# Patient Record
Sex: Male | Born: 1975 | Race: White | Hispanic: No | Marital: Single
Health system: Southern US, Community
[De-identification: ages and names within clinical notes are randomized; demographics above are authoritative.]

## PROBLEM LIST (undated history)

## (undated) DIAGNOSIS — F329 Major depressive disorder, single episode, unspecified: Secondary | ICD-10-CM

## (undated) DIAGNOSIS — F1410 Cocaine abuse, uncomplicated: Secondary | ICD-10-CM

## (undated) DIAGNOSIS — K5090 Crohn's disease, unspecified, without complications: Secondary | ICD-10-CM

## (undated) DIAGNOSIS — K219 Gastro-esophageal reflux disease without esophagitis: Secondary | ICD-10-CM

## (undated) DIAGNOSIS — J45909 Unspecified asthma, uncomplicated: Secondary | ICD-10-CM

## (undated) DIAGNOSIS — F1010 Alcohol abuse, uncomplicated: Secondary | ICD-10-CM

## (undated) DIAGNOSIS — F32A Depression, unspecified: Secondary | ICD-10-CM

---

## 1998-03-27 ENCOUNTER — Emergency Department (HOSPITAL_COMMUNITY): Admission: EM | Admit: 1998-03-27 | Discharge: 1998-03-27 | Payer: Self-pay | Admitting: Emergency Medicine

## 1998-03-27 ENCOUNTER — Encounter: Payer: Self-pay | Admitting: Emergency Medicine

## 2004-02-25 ENCOUNTER — Emergency Department (HOSPITAL_COMMUNITY): Admission: AD | Admit: 2004-02-25 | Discharge: 2004-02-25 | Payer: Self-pay | Admitting: Family Medicine

## 2005-03-30 ENCOUNTER — Ambulatory Visit: Payer: Self-pay | Admitting: Internal Medicine

## 2005-04-10 ENCOUNTER — Ambulatory Visit: Payer: Self-pay | Admitting: *Deleted

## 2005-05-01 ENCOUNTER — Ambulatory Visit: Payer: Self-pay | Admitting: Internal Medicine

## 2005-05-03 ENCOUNTER — Ambulatory Visit: Payer: Self-pay | Admitting: Internal Medicine

## 2006-05-03 ENCOUNTER — Ambulatory Visit: Payer: Self-pay | Admitting: Internal Medicine

## 2006-05-14 DIAGNOSIS — K5090 Crohn's disease, unspecified, without complications: Secondary | ICD-10-CM

## 2006-05-14 HISTORY — DX: Crohn's disease, unspecified, without complications: K50.90

## 2006-06-11 ENCOUNTER — Ambulatory Visit: Payer: Self-pay | Admitting: Internal Medicine

## 2006-08-09 ENCOUNTER — Ambulatory Visit: Payer: Self-pay | Admitting: Internal Medicine

## 2006-12-13 ENCOUNTER — Ambulatory Visit: Payer: Self-pay | Admitting: Internal Medicine

## 2007-01-29 ENCOUNTER — Encounter (INDEPENDENT_AMBULATORY_CARE_PROVIDER_SITE_OTHER): Payer: Self-pay | Admitting: *Deleted

## 2007-02-18 ENCOUNTER — Emergency Department (HOSPITAL_COMMUNITY): Admission: EM | Admit: 2007-02-18 | Discharge: 2007-02-18 | Payer: Self-pay | Admitting: Emergency Medicine

## 2007-02-19 ENCOUNTER — Emergency Department (HOSPITAL_COMMUNITY): Admission: EM | Admit: 2007-02-19 | Discharge: 2007-02-19 | Payer: Self-pay | Admitting: Emergency Medicine

## 2007-02-21 ENCOUNTER — Telehealth (INDEPENDENT_AMBULATORY_CARE_PROVIDER_SITE_OTHER): Payer: Self-pay | Admitting: *Deleted

## 2007-02-23 ENCOUNTER — Emergency Department (HOSPITAL_COMMUNITY): Admission: EM | Admit: 2007-02-23 | Discharge: 2007-02-24 | Payer: Self-pay | Admitting: Emergency Medicine

## 2007-04-01 ENCOUNTER — Ambulatory Visit: Payer: Self-pay | Admitting: Psychiatry

## 2007-04-01 ENCOUNTER — Inpatient Hospital Stay (HOSPITAL_COMMUNITY): Admission: RE | Admit: 2007-04-01 | Discharge: 2007-04-04 | Payer: Self-pay | Admitting: Psychiatry

## 2007-04-01 ENCOUNTER — Emergency Department (HOSPITAL_COMMUNITY): Admission: EM | Admit: 2007-04-01 | Discharge: 2007-04-01 | Payer: Self-pay | Admitting: Emergency Medicine

## 2007-05-22 ENCOUNTER — Encounter (INDEPENDENT_AMBULATORY_CARE_PROVIDER_SITE_OTHER): Payer: Self-pay | Admitting: Internal Medicine

## 2007-06-27 ENCOUNTER — Telehealth (INDEPENDENT_AMBULATORY_CARE_PROVIDER_SITE_OTHER): Payer: Self-pay | Admitting: Nurse Practitioner

## 2007-07-18 ENCOUNTER — Ambulatory Visit: Payer: Self-pay | Admitting: Internal Medicine

## 2007-07-18 DIAGNOSIS — K5090 Crohn's disease, unspecified, without complications: Secondary | ICD-10-CM | POA: Insufficient documentation

## 2007-07-18 DIAGNOSIS — F329 Major depressive disorder, single episode, unspecified: Secondary | ICD-10-CM | POA: Insufficient documentation

## 2007-07-18 DIAGNOSIS — F1910 Other psychoactive substance abuse, uncomplicated: Secondary | ICD-10-CM | POA: Insufficient documentation

## 2007-07-18 DIAGNOSIS — F17200 Nicotine dependence, unspecified, uncomplicated: Secondary | ICD-10-CM | POA: Insufficient documentation

## 2007-07-18 DIAGNOSIS — J309 Allergic rhinitis, unspecified: Secondary | ICD-10-CM | POA: Insufficient documentation

## 2007-07-18 DIAGNOSIS — J45909 Unspecified asthma, uncomplicated: Secondary | ICD-10-CM | POA: Insufficient documentation

## 2007-07-18 DIAGNOSIS — K219 Gastro-esophageal reflux disease without esophagitis: Secondary | ICD-10-CM | POA: Insufficient documentation

## 2007-07-23 ENCOUNTER — Encounter (INDEPENDENT_AMBULATORY_CARE_PROVIDER_SITE_OTHER): Payer: Self-pay | Admitting: Internal Medicine

## 2007-07-24 ENCOUNTER — Encounter (INDEPENDENT_AMBULATORY_CARE_PROVIDER_SITE_OTHER): Payer: Self-pay | Admitting: Internal Medicine

## 2007-08-08 ENCOUNTER — Emergency Department (HOSPITAL_COMMUNITY): Admission: EM | Admit: 2007-08-08 | Discharge: 2007-08-08 | Payer: Self-pay | Admitting: Family Medicine

## 2007-12-05 ENCOUNTER — Emergency Department (HOSPITAL_COMMUNITY): Admission: EM | Admit: 2007-12-05 | Discharge: 2007-12-05 | Payer: Self-pay | Admitting: Emergency Medicine

## 2007-12-05 ENCOUNTER — Ambulatory Visit: Payer: Self-pay | Admitting: Psychiatry

## 2007-12-05 ENCOUNTER — Inpatient Hospital Stay (HOSPITAL_COMMUNITY): Admission: AD | Admit: 2007-12-05 | Discharge: 2007-12-09 | Payer: Self-pay | Admitting: Psychiatry

## 2008-02-24 ENCOUNTER — Encounter (INDEPENDENT_AMBULATORY_CARE_PROVIDER_SITE_OTHER): Payer: Self-pay | Admitting: Internal Medicine

## 2008-02-26 ENCOUNTER — Telehealth (INDEPENDENT_AMBULATORY_CARE_PROVIDER_SITE_OTHER): Payer: Self-pay | Admitting: Internal Medicine

## 2008-02-26 ENCOUNTER — Encounter (INDEPENDENT_AMBULATORY_CARE_PROVIDER_SITE_OTHER): Payer: Self-pay | Admitting: Internal Medicine

## 2008-03-03 ENCOUNTER — Encounter (INDEPENDENT_AMBULATORY_CARE_PROVIDER_SITE_OTHER): Payer: Self-pay | Admitting: *Deleted

## 2008-03-18 ENCOUNTER — Encounter (INDEPENDENT_AMBULATORY_CARE_PROVIDER_SITE_OTHER): Payer: Self-pay | Admitting: Internal Medicine

## 2008-04-16 ENCOUNTER — Ambulatory Visit: Payer: Self-pay | Admitting: Internal Medicine

## 2008-09-11 ENCOUNTER — Emergency Department (HOSPITAL_COMMUNITY): Admission: EM | Admit: 2008-09-11 | Discharge: 2008-09-11 | Payer: Self-pay | Admitting: Emergency Medicine

## 2008-09-17 ENCOUNTER — Other Ambulatory Visit: Payer: Self-pay | Admitting: Emergency Medicine

## 2008-09-17 ENCOUNTER — Inpatient Hospital Stay (HOSPITAL_COMMUNITY): Admission: EM | Admit: 2008-09-17 | Discharge: 2008-09-19 | Payer: Self-pay | Admitting: Emergency Medicine

## 2008-11-01 ENCOUNTER — Emergency Department (HOSPITAL_COMMUNITY): Admission: EM | Admit: 2008-11-01 | Discharge: 2008-11-02 | Payer: Self-pay | Admitting: Emergency Medicine

## 2008-11-01 ENCOUNTER — Emergency Department: Payer: Self-pay | Admitting: Unknown Physician Specialty

## 2008-11-02 ENCOUNTER — Inpatient Hospital Stay (HOSPITAL_COMMUNITY): Admission: RE | Admit: 2008-11-02 | Discharge: 2008-11-09 | Payer: Self-pay | Admitting: Psychiatry

## 2008-11-10 ENCOUNTER — Other Ambulatory Visit: Admission: RE | Admit: 2008-11-10 | Discharge: 2008-12-06 | Payer: Self-pay | Admitting: Psychiatry

## 2008-11-10 ENCOUNTER — Other Ambulatory Visit (HOSPITAL_COMMUNITY): Payer: Self-pay | Admitting: Psychiatry

## 2008-12-08 ENCOUNTER — Inpatient Hospital Stay (HOSPITAL_COMMUNITY): Admission: AD | Admit: 2008-12-08 | Discharge: 2008-12-13 | Payer: Self-pay | Admitting: Psychiatry

## 2008-12-08 ENCOUNTER — Ambulatory Visit: Payer: Self-pay | Admitting: Psychiatry

## 2008-12-14 ENCOUNTER — Other Ambulatory Visit (HOSPITAL_COMMUNITY): Admission: RE | Admit: 2008-12-14 | Discharge: 2009-01-04 | Payer: Self-pay | Admitting: Psychiatry

## 2009-03-17 ENCOUNTER — Ambulatory Visit: Payer: Self-pay | Admitting: Internal Medicine

## 2009-04-01 ENCOUNTER — Inpatient Hospital Stay (HOSPITAL_COMMUNITY): Admission: RE | Admit: 2009-04-01 | Discharge: 2009-04-04 | Payer: Self-pay | Admitting: Psychiatry

## 2009-04-01 ENCOUNTER — Ambulatory Visit: Payer: Self-pay | Admitting: Psychiatry

## 2009-04-15 ENCOUNTER — Inpatient Hospital Stay (HOSPITAL_COMMUNITY): Admission: AD | Admit: 2009-04-15 | Discharge: 2009-04-20 | Payer: Self-pay | Admitting: Psychiatry

## 2009-04-28 ENCOUNTER — Telehealth (INDEPENDENT_AMBULATORY_CARE_PROVIDER_SITE_OTHER): Payer: Self-pay | Admitting: Internal Medicine

## 2009-06-15 ENCOUNTER — Ambulatory Visit: Payer: Self-pay | Admitting: Internal Medicine

## 2009-06-15 DIAGNOSIS — R635 Abnormal weight gain: Secondary | ICD-10-CM | POA: Insufficient documentation

## 2009-06-15 LAB — CONVERTED CEMR LAB
Albumin: 5.1 g/dL (ref 3.5–5.2)
Alkaline Phosphatase: 69 units/L (ref 39–117)
BUN: 15 mg/dL (ref 6–23)
CO2: 24 meq/L (ref 19–32)
Calcium: 10.2 mg/dL (ref 8.4–10.5)
Chloride: 103 meq/L (ref 96–112)
Cholesterol: 203 mg/dL — ABNORMAL HIGH (ref 0–200)
Eosinophils Relative: 1 % (ref 0–5)
Glucose, Bld: 76 mg/dL (ref 70–99)
HCT: 46.5 % (ref 39.0–52.0)
HDL: 62 mg/dL (ref >39)
Hemoglobin: 15.7 g/dL (ref 13.0–17.0)
LDL Cholesterol: 121 mg/dL — ABNORMAL HIGH (ref 0–99)
Lymphocytes Relative: 28 % (ref 12–46)
Monocytes Absolute: 0.5 10*3/uL (ref 0.1–1.0)
Monocytes Relative: 8 % (ref 3–12)
Neutro Abs: 3.4 10*3/uL (ref 1.7–7.7)
Potassium: 4.3 meq/L (ref 3.5–5.3)
RBC: 5.24 M/uL (ref 4.22–5.81)
RDW: 13.3 % (ref 11.5–15.5)
Sodium: 139 meq/L (ref 135–145)
Total Protein: 7.9 g/dL (ref 6.0–8.3)
Triglycerides: 100 mg/dL (ref <150)

## 2009-06-21 ENCOUNTER — Telehealth (INDEPENDENT_AMBULATORY_CARE_PROVIDER_SITE_OTHER): Payer: Self-pay | Admitting: Internal Medicine

## 2009-07-13 ENCOUNTER — Encounter (INDEPENDENT_AMBULATORY_CARE_PROVIDER_SITE_OTHER): Payer: Self-pay | Admitting: Internal Medicine

## 2009-07-20 ENCOUNTER — Encounter (INDEPENDENT_AMBULATORY_CARE_PROVIDER_SITE_OTHER): Payer: Self-pay | Admitting: *Deleted

## 2009-10-12 ENCOUNTER — Ambulatory Visit: Payer: Self-pay | Admitting: Internal Medicine

## 2009-10-13 ENCOUNTER — Encounter (INDEPENDENT_AMBULATORY_CARE_PROVIDER_SITE_OTHER): Payer: Self-pay | Admitting: Internal Medicine

## 2009-10-21 ENCOUNTER — Ambulatory Visit: Payer: Self-pay | Admitting: Psychiatry

## 2009-10-21 ENCOUNTER — Inpatient Hospital Stay (HOSPITAL_COMMUNITY): Admission: RE | Admit: 2009-10-21 | Discharge: 2009-10-25 | Payer: Self-pay | Admitting: Psychiatry

## 2009-11-01 DIAGNOSIS — R7309 Other abnormal glucose: Secondary | ICD-10-CM | POA: Insufficient documentation

## 2009-11-02 LAB — CONVERTED CEMR LAB
Albumin: 4.7 g/dL (ref 3.5–5.2)
Alkaline Phosphatase: 80 units/L (ref 39–117)
BUN: 14 mg/dL (ref 6–23)
Basophils Absolute: 0 10*3/uL (ref 0.0–0.1)
Calcium: 10 mg/dL (ref 8.4–10.5)
Chloride: 100 meq/L (ref 96–112)
Creatinine, Ser: 0.99 mg/dL (ref 0.40–1.50)
Eosinophils Absolute: 0.1 10*3/uL (ref 0.0–0.7)
Eosinophils Relative: 2 % (ref 0–5)
Glucose, Bld: 110 mg/dL — ABNORMAL HIGH (ref 70–99)
HCT: 46 % (ref 39.0–52.0)
Hemoglobin: 15.4 g/dL (ref 13.0–17.0)
Lymphocytes Relative: 29 % (ref 12–46)
MCV: 89.3 fL (ref 78.0–100.0)
Monocytes Absolute: 0.6 10*3/uL (ref 0.1–1.0)
Potassium: 4.3 meq/L (ref 3.5–5.3)
RDW: 14.2 % (ref 11.5–15.5)

## 2009-11-10 ENCOUNTER — Ambulatory Visit: Payer: Self-pay | Admitting: Internal Medicine

## 2009-11-10 LAB — CONVERTED CEMR LAB
Blood Glucose, AC Bkfst: 103 mg/dL
Hgb A1c MFr Bld: 5.6 % (ref <5.7)

## 2009-11-28 ENCOUNTER — Encounter (INDEPENDENT_AMBULATORY_CARE_PROVIDER_SITE_OTHER): Payer: Self-pay | Admitting: Internal Medicine

## 2010-05-14 HISTORY — PX: FINGER AMPUTATION: SHX636

## 2010-06-13 NOTE — Letter (Signed)
Summary: *HSN Results Follow up  [facility]  [address] [place], [place] [address]   Phone: [phone]  Fax: [phone]      07/20/2009   Brendan Ochoa [address] [place], [place]  [address]   Dear  [name],                            ____S.Drinkard,FNP   ____D. Gore,FNP       ____B. McPherson,MD   ____V. Rankins,MD    __x__E. Mulberry,MD    ____N. Daphine Deutscher, FNP  ____D. Reche Dixon, MD    ____K. Philipp Deputy, MD    ____Other     This letter is to inform you that your recent test(s):  _______Pap Smear    _______Lab Test     _______X-ray    _______ is within acceptable limits  _______ requires a medication change  _______ requires a follow-up lab visit  _______ requires a follow-up visit with your provider   Comments:  Please give [name] a call regarding your medications.  Thank you.       _________________________________________________________ If you have any questions, please contact our office                     Sincerely,  Vesta Mixer CMA [facility]

## 2010-06-13 NOTE — Letter (Signed)
Summary: LAB REQUEST/[name]  LAB REQUEST/[name]   Imported By: Arta Bruce 10/13/2009 10:55:14  _____________________________________________________________________  External Attachment:    Type:   Image     Comment:   External Document

## 2010-06-13 NOTE — Letter (Signed)
Summary: *HSN Results Follow up  [facility]  [address] [place], [place] [address]   Phone: [phone]  Fax: [phone]      11/28/2009   BYRON PEACOCK [address] [place], [place]  [address]   Dear  [name],                            ____S.Drinkard,FNP   ____D. Gore,FNP       ____B. McPherson,MD   ____V. Rankins,MD    __X__E. Helma Argyle,MD    ____N. Daphine Deutscher, FNP  ____D. Reche Dixon, MD    ____K. Philipp Deputy, MD    ____Other     This letter is to inform you that your recent test(s):  _______Pap Smear    ____X___Lab Test     _______X-ray    _______ is within acceptable limits  _______ requires a medication change  _______ requires a follow-up lab visit  ____X___ requires a follow-up visit with your provider   Comments:  I do not know what you weigh currently, but your sugar and the test we do to see where your sugars have been running in the past 4 months are in the high normal range.  I am very concerned that you will develop diabetes if you do not change your eating/exercise habits.  That 30 lbs you gained seems to have been a bit much.  Please call and make an appt. to discuss and perhaps have you see a nutritionist.       _________________________________________________________ If you have any questions, please contact our office                     Sincerely,  Julieanne Manson MD [facility]

## 2010-06-13 NOTE — Progress Notes (Signed)
  Medications Added CYMBALTA 60 MG CPEP (DULOXETINE HCL) 1 by mouth once daily ABILIFY 5 MG TABS (ARIPIPRAZOLE) 1 by mouth once daily LAMICTAL 100 MG TABS (LAMOTRIGINE) 1 by mouth once daily NEURONTIN 300 MG CAPS (GABAPENTIN) 1 by mouth three times a day       Phone Note Call from Patient Call back at [facility] Phone [phone]   Summary of Call: The pt needs to inform the provider that he takes  three different medication including cymbalta  and two more depression medication avilfy / Lamictil) and neurotin 300 mg. [facility]. Fritz Cauthon MD Initial call taken by: Manon Hilding,  June 21, 2009 2:31 PM  Follow-up for Phone Call        Left msg for pt to call back.  We already knew the names of the meds we need the doses of them. Follow-up by: Vesta Mixer CMA,  June 21, 2009 5:06 PM  Additional Follow-up for Phone Call Additional follow up Details #1::        Pt called back and said he already gave the doses when he called yesterday.  I apoligized to him for our error in not taking them down and he gave them to me again. Cymbalta 60mg  once daily, abilify 5mg  once daily, lamictal 100mg  once daily, and neurontin 300mg  three times a day.  Will update in Med list. Additional Follow-up by: Vesta Mixer CMA,  June 22, 2009 9:25 AM    New/Updated Medications: CYMBALTA 60 MG CPEP (DULOXETINE HCL) 1 by mouth once daily ABILIFY 5 MG TABS (ARIPIPRAZOLE) 1 by mouth once daily LAMICTAL 100 MG TABS (LAMOTRIGINE) 1 by mouth once daily NEURONTIN 300 MG CAPS (GABAPENTIN) 1 by mouth three times a day

## 2010-06-13 NOTE — Assessment & Plan Note (Signed)
Summary: follow up visit with Dr Gerritt Galentine//gk   Vital Signs:  Patient profile:   35 year old male Height:      70.75 inches Weight:      182.8 pounds BMI:     25.77 Temp:     97.3 degrees F Pulse rate:   77 / minute Pulse rhythm:   regular Resp:     16 per minute BP sitting:   112 / 78  (left arm) Cuff size:   regular  Vitals Entered By: Vesta Mixer CMA (June 15, 2009 9:59 AM) CC: f/u for med refills.  Not taking celexa.  Per pt taking lamictal, abilify, neurontin, cymbalta does not know the doses. Is Patient Diabetic? No Pain Assessment Patient in pain? yes     Location: back Intensity: 3  Does patient need assistance? Ambulation Normal   CC:  f/u for med refills.  Not taking celexa.  Per pt taking lamictal, abilify, neurontin, and cymbalta does not know the doses..  History of Present Illness: 1.  Crohn's Disease:  Taking Asacol 400 mg three times a day.  Not sure when Dr. Elnoria Howard wanted him to follow up with repeat colonoscopy--last was in 2009.  Did have at least one polyp.  Feels like he is doing well with this.  Taking meds regularly. No diarrhea recently.  No melena or hematochezia.  2.  Psych:  Not sure if he is diagnosed with just Major Depression or Bipolar Disorder.  Currently seeing Dr. Haig Prophet started pt on current regimen.  Not clear who his psychiatrist is--2 have retired.  Sees [facility] as well.  Feels like he is doing well with this now.  No suicidal ideation.  Looks forward to day most of time.  Not currently employed.  Has not been drinking alcohol for 6 months.  Not going to AA--struggles with going to groups, though is in group therapy.  Family recognizes that he is doing quite well.  Would like to find a job in construction--would really like to make furniture.  3.  Asthma:  Has not needed to use rescue inhaler much--last was 2 weeks ago.  Smoking still--rolls own cigarettes, so not filtered 4-5 daily.  4.  GERD:  controlled on Protonix  5.   Allergies:  controlled.  Allergies (verified): No Known Drug Allergies  Physical Exam  General:  NAD, appears calmer and happier Head:  Normocephalic and atraumatic without obvious abnormalities. No apparent alopecia or balding. Eyes:  No corneal or conjunctival inflammation noted. EOMI. Perrla. Funduscopic exam benign, without hemorrhages, exudates or papilledema. Vision grossly normal. Ears:  External ear exam shows no significant lesions or deformities.  Otoscopic examination reveals clear canals, tympanic membranes are intact bilaterally without bulging, retraction, inflammation or discharge. Hearing is grossly normal bilaterally. Nose:  left nostril quite narrow with obvious of old fracture and septal deviation Mouth:  pharynx pink and moist.   Neck:  No deformities, masses, or tenderness noted. Lungs:  Normal respiratory effort, chest expands symmetrically. Lungs are clear to auscultation, no crackles or wheezes. Heart:  Normal rate and regular rhythm. S1 and S2 normal without gallop, murmur, click, rub or other extra sounds. Abdomen:  Bowel sounds positive,abdomen soft and non-tender without masses, organomegaly or hernias noted.   Impression & Recommendations:  Problem # 1:  CROHN'S DISEASE (ICD-555.9)  Controlled  Orders: T-Comprehensive Metabolic Panel ([id number]) T-CBC w/Diff ([id number])  Problem # 2:  ABNORMAL WEIGHT GAIN (ICD-783.1) Discussed would like seeing him in 168 range Orders: T-Comprehensive  Metabolic Panel [id number]) T-Lipid Profile ([id number])  Problem # 3:  DEPRESSION (ICD-311) [name]--seems to be doing well with this and addictions as well. The following medications were removed from the medication list:    Celexa 40 Mg Tabs (Citalopram hydrobromide) .Marland Kitchen... 1 tab by mouth once daily His updated medication list for this problem includes:    Cymbalta 60 Mg Cpep (Duloxetine hcl) .Marland Kitchen... 1 by mouth once daily  [facility]  Problem #  4:  ASTHMA (ICD-493.90) Controlled Encouraged smoking cessation. His updated medication list for this problem includes:    Advair Diskus 250-50 Mcg/dose Misc (Fluticasone-salmeterol) .Marland Kitchen... 1 inhalation two times a day    Ventolin Hfa 108 (90 Base) Mcg/act Aers (Albuterol sulfate) .Marland Kitchen... 2 puffs every 6 hours as needed for shortness of breath  Problem # 5:  GERD (ICD-530.81) Controlled His updated medication list for this problem includes:    Protonix 40 Mg Tbec (Pantoprazole sodium) .Marland Kitchen... 1 tab by mouth daily  Problem # 6:  ALLERGIC RHINITIS (ICD-477.9) Controlled His updated medication list for this problem includes:    Flonase 50 Mcg/act Susp (Fluticasone propionate) .Marland Kitchen... 2 sprays each nostril daily  Complete Medication List: 1)  Advair Diskus 250-50 Mcg/dose Misc (Fluticasone-salmeterol) .Marland Kitchen.. 1 inhalation two times a day 2)  Ventolin Hfa 108 (90 Base) Mcg/act Aers (Albuterol sulfate) .... 2 puffs every 6 hours as needed for shortness of breath 3)  Protonix 40 Mg Tbec (Pantoprazole sodium) .Marland Kitchen.. 1 tab by mouth daily 4)  Flonase 50 Mcg/act Susp (Fluticasone propionate) .... 2 sprays each nostril daily 5)  Cymbalta 60 Mg Cpep (Duloxetine hcl) .Marland Kitchen.. 1 by mouth once daily  [facility] 6)  Abilify 5 Mg Tabs (Aripiprazole) .Marland Kitchen.. 1 by mouth once daily  [facility] 7)  Lamictal 100 Mg Tabs (Lamotrigine) .Marland Kitchen.. 1 by mouth once daily  [facility] 8)  Neurontin 300 Mg Caps (Gabapentin) .Marland Kitchen.. 1 by mouth three times a day  [facility] 9)  Pentasa 500 Mg Cr-caps (Mesalamine) .Marland Kitchen.. 1 cap by mouth 4 times daily  Patient Instructions: 1)  Call back with names and dosages of meds from [facility] this week. 2)  Follow up with Dr. Delrae Alfred in 6 months - to 1 year

## 2010-06-13 NOTE — Letter (Signed)
Summary: Lipid Letter  [facility]  [address] [place], [place] [address]   Phone: [phone]  Fax: [phone]    07/13/2009  Brendan Ochoa [address] [place], [place]  [address]  Dear Brendan Ochoa:  We have carefully reviewed your last lipid profile from 06/15/2009 and the results are noted below with a summary of recommendations for lipid management.    Cholesterol:       203     Goal: <200   HDL "good" Cholesterol:   62     Goal: >45   LDL "bad" Cholesterol:   121     Goal: <100   Triglycerides:       100     Goal: <150    Cholesterol needs a little work--stay away from a lot of red meat and fried, fatty foods.  Work on 3 servings of vegetables daily and 2 servings of fruit--leave the skin on if edible!  Avoid soda.  Recommend recheck in one year.  By the way, the pharmacy no longer carries Asacol.  You will be switched to Pentasa, which is basically the same med, but you will be dosed a bit differently.    TLC Diet (Therapeutic Lifestyle Change): Saturated Fats & Transfatty acids should be kept < 7% of total calories ***Reduce Saturated Fats Polyunstaurated Fat can be up to 10% of total calories Monounsaturated Fat Fat can be up to 20% of total calories Total Fat should be no greater than 25-35% of total calories Carbohydrates should be 50-60% of total calories Protein should be approximately 15% of total calories Fiber should be at least 20-30 grams a day ***Increased fiber may help lower LDL Total Cholesterol should be < 200mg /day Consider adding plant stanol/sterols to diet (example: Benacol spread) ***A higher intake of unsaturated fat may reduce Triglycerides and Increase HDL    Adjunctive Measures (may lower LIPIDS and reduce risk of Heart Attack) include: Aerobic Exercise (20-30 minutes 3-4 times a week) Limit Alcohol Consumption Weight Reduction Aspirin 75-81 mg a day by mouth (if not allergic or contraindicated) Dietary Fiber 20-30 grams a  day by mouth     Current Medications: 1)    Advair Diskus 250-50 Mcg/dose  Misc (Fluticasone-salmeterol) .Marland Kitchen.. 1 inhalation two times a day 2)    Ventolin Hfa 108 (90 Base) Mcg/act  Aers (Albuterol sulfate) .... 2 puffs every 6 hours as needed for shortness of breath 3)    Asacol 400 Mg  Tbec (Mesalamine) .Marland Kitchen.. 1 tab by mouth three times a day 4)    Celexa 40 Mg  Tabs (Citalopram hydrobromide) .Marland Kitchen.. 1 tab by mouth once daily 5)    Protonix 40 Mg  Tbec (Pantoprazole sodium) .Marland Kitchen.. 1 tab by mouth daily 6)    Flonase 50 Mcg/act Susp (Fluticasone propionate) .... 2 sprays each nostril daily 7)    Cymbalta 60 Mg Cpep (Duloxetine hcl) .Marland Kitchen.. 1 by mouth once daily 8)    Abilify 5 Mg Tabs (Aripiprazole) .Marland Kitchen.. 1 by mouth once daily 9)    Lamictal 100 Mg Tabs (Lamotrigine) .Marland Kitchen.. 1 by mouth once daily 10)    Neurontin 300 Mg Caps (Gabapentin) .Marland Kitchen.. 1 by mouth three times a day  If you have any questions, please call. We appreciate being able to work with you.   Sincerely,    [facility] Julieanne Manson MD

## 2010-07-27 ENCOUNTER — Telehealth (INDEPENDENT_AMBULATORY_CARE_PROVIDER_SITE_OTHER): Payer: Self-pay | Admitting: Internal Medicine

## 2010-08-01 ENCOUNTER — Encounter: Payer: Self-pay | Admitting: Internal Medicine

## 2010-08-01 ENCOUNTER — Encounter (INDEPENDENT_AMBULATORY_CARE_PROVIDER_SITE_OTHER): Payer: Self-pay | Admitting: Internal Medicine

## 2010-08-01 NOTE — Progress Notes (Signed)
Summary: Missed call from [facility] Note Call from Patient   Caller: Patient Summary of Call: states Gerilyn Nestle calling him - thinks regarding results, please return call to patient  Called pt. back and LM on [phone] re: Rx note from 07/17/10... Hale Drone CMA  July 27, 2010 5:12 PM  Initial call taken by: Ernestine Mcmurray,  July 27, 2010 4:01 PM

## 2010-08-10 NOTE — Assessment & Plan Note (Signed)
Summary: Review meds   Vital Signs:  Patient profile:   35 year old male Weight:      173.38 pounds BMI:     24.44 Temp:     97.7 degrees F oral Pulse rate:   98 / minute Pulse rhythm:   regular Resp:     20 per minute BP sitting:   110 / 71  (right arm) Cuff size:   regular  Vitals Entered By: Hale Drone CMA (August 01, 2010 12:08 PM) CC: f/u on meds... has not been seen in over a year.... Has stopped Pentasa about 2 weeks ago and ever since has been having bloody stools... The reason why he stopped it is b/c it's not working and believes Asacol was better.  Is Patient Diabetic? No Pain Assessment Patient in pain? no       Does patient need assistance? Functional Status Self care Ambulation Normal   CC:  f/u on meds... has not been seen in over a year.... Has stopped Pentasa about 2 weeks ago and ever since has been having bloody stools... The reason why he stopped it is b/c it's not working and believes Asacol was better. Marland Kitchen  History of Present Illness: 1.  Crohns Disease:  Has been taking Pentasa 500 mg 4 times daily at least since March of 2011.  Started  having abdominal cramping and diarrhea maybe 1 month ago, so increased Pentasa to 3 caps 4 times daily --tried 2 caps over 2 days, but did not note improvement, so went up to 3 caps quickly.  Developed bad nausea and vomiting on a daily basis for some time.  Stopped medication a little less than 2 weeks ago as pharmacist told him the nausea and vomiting was likely due to too high dose of Pentasa.  Pt. states he was very well controlled with the 500 mg 4 times daily until about 1 month ago.  Since stopping everything, developed bloody diarrhea --15 to 20 times daily--small stools.  Having abdominal cramping.  2.  Very stressed:  Had significant med changes for psych meds 4-6 months ago.  Started back to smoking 3 months ago.  Girlfriend pregnant--pt. does not feel they can support a baby.  Best friend hung himself last  week--struggled with drug and alcohol dependence--cleaned himself up and then developed a back injury and   3.  Asthma and allergies:  Using Flonase and Advair regularly.  Needing rescue inhaler once to 5 times weekly.  Has OTC Loratadine at home--has not needed recently.    Current Medications (verified): 1)  Advair Diskus 250-50 Mcg/dose  Misc (Fluticasone-Salmeterol) .Marland Kitchen.. 1 Inhalation Two Times A Day 2)  Ventolin Hfa 108 (90 Base) Mcg/act  Aers (Albuterol Sulfate) .... 2 Puffs Every 6 Hours As Needed For Shortness of Breath 3)  Protonix 40 Mg  Tbec (Pantoprazole Sodium) .Marland Kitchen.. 1 Tab By Mouth Daily 4)  Flonase 50 Mcg/act Susp (Fluticasone Propionate) .... 2 Sprays Each Nostril Daily 5)  Cymbalta 60 Mg Cpep (Duloxetine Hcl) .Marland Kitchen.. 1 By Mouth Once Daily  [name] 6)  Abilify 5 Mg Tabs (Aripiprazole) .Marland Kitchen.. 1 By Mouth Once Daily  [name] 7)  Lamictal 100 Mg Tabs (Lamotrigine) .Marland Kitchen.. 1 By Mouth Once Daily  [name] 8)  Neurontin 300 Mg Caps (Gabapentin) .Marland Kitchen.. 1 By Mouth Three Times A Day  [name] 9)  Pentasa 500 Mg Cr-Caps (Mesalamine) .Marland Kitchen.. 1 Cap By Mouth 4 Times Daily  Allergies (verified): No Known Drug Allergies  Physical Exam  General:  NAD Lungs:  Normal respiratory effort, chest expands symmetrically. Lungs are clear to auscultation, no crackles or wheezes. Heart:  Normal rate and regular rhythm. S1 and S2 normal without gallop, murmur, click, rub or other extra sounds. Abdomen:  Mild diffuse tenderness.  No guarding or rigidity.  No rebound or peritoneal signs.  No HSM   Impression & Recommendations:  Problem # 1:  CROHN'S DISEASE (ICD-555.9) Restart Pentasa at 1000 mg 4 times daily  Prednisone burst and taper. Call if problems--especially if nausea recurs  Problem # 2:  DEPRESSION (ICD-311) And anxiety--encouraged to call into [facility] and  discuss his recent stressors--loss of friend with similar addiction difficulties and pregnancy. Pt very  concerned that he is not in a place where he can be a good father--emotionally or financially. His updated medication list for this problem includes:    Pristiq 50 Mg Xr24h-tab (Desvenlafaxine succinate) .Marland Kitchen... 2 tabs by mouth daily--[facility]  Complete Medication List: 1)  Advair Diskus 250-50 Mcg/dose Misc (Fluticasone-salmeterol) .Marland Kitchen.. 1 inhalation two times a day 2)  Ventolin Hfa 108 (90 Base) Mcg/act Aers (Albuterol sulfate) .... 2 puffs every 6 hours as needed for shortness of breath 3)  Protonix 40 Mg Tbec (Pantoprazole sodium) .Marland Kitchen.. 1 tab by mouth daily 4)  Flonase 50 Mcg/act Susp (Fluticasone propionate) .... 2 sprays each nostril daily 5)  Pristiq 50 Mg Xr24h-tab (Desvenlafaxine succinate) .... 2 tabs by mouth daily--[facility] 6)  Pentasa 500 Mg Cr-caps (Mesalamine) .... 2 caps by mouth 4 times daily 7)  Prednisone 10 Mg Tabs (Prednisone) .... 4 tabs by mouth daily for 4 days, then 3 tabs daily for 4 days, then 2 tabs daily for 4 days, then 1 tab daily for 4 days, then 1/2 tab daily for 4 days, then stop  Patient Instructions: 1)  Follow up with Dr. Delrae Alfred in 6 weeks Crohns 2)  Call if no improvement in 2 weeks Prescriptions: FLONASE 50 MCG/ACT SUSP (FLUTICASONE PROPIONATE) 2 sprays each nostril daily  #1 x 11   Entered and Authorized by:   Julieanne Manson MD   Signed by:   Julieanne Manson MD on 08/01/2010   Method used:   Faxed to ...       [facility] (retail)       [address] [place], [place]  [address]       Ph: [phone]       Fax: [phone]   RxID:   [id number] PROTONIX 40 MG  TBEC (PANTOPRAZOLE SODIUM) 1 tab by mouth daily  #30 x 11   Entered and Authorized by:   Julieanne Manson MD   Signed by:   Julieanne Manson MD on 08/01/2010   Method used:   Faxed to ...       [facility] (retail)       [address] [place], [place]  [address]       Ph:  [phone]       Fax: [phone]   RxID:   [id number] ADVAIR DISKUS 250-50 MCG/DOSE  MISC (FLUTICASONE-SALMETEROL) 1 inhalation two times a day  #1 x 11   Entered and Authorized by:   Julieanne Manson MD   Signed by:   Julieanne Manson MD on 08/01/2010   Method used:   Faxed to ...       [facility] (retail)  [address] [place], [place]  [address]       Ph: [phone]       Fax: [phone]   RxID:   [id number] PREDNISONE 10 MG TABS (PREDNISONE) 4 tabs by mouth daily for 4 days, then 3 tabs daily for 4 days, then 2 tabs daily for 4 days, then 1 tab daily for 4 days, then 1/2 tab daily for 4 days, then stop  #42 x 0   Entered and Authorized by:   Julieanne Manson MD   Signed by:   Julieanne Manson MD on 08/01/2010   Method used:   Faxed to ...       [facility] (retail)       [address] [place], [place]  [address]       Ph: [phone]       Fax: [phone]   RxID:   [id number] PENTASA 500 MG CR-CAPS (MESALAMINE) 2 caps by mouth 4 times daily  #240 x 2   Entered and Authorized by:   Julieanne Manson MD   Signed by:   Julieanne Manson MD on 08/01/2010   Method used:   Faxed to ...       [facility] (retail)       [address] [place], [place]  [address]       Ph: [phone]       Fax: [phone]   RxID:   [id number]    Orders Added: 1)  Est. Patient Level III [phone]

## 2010-08-10 NOTE — Progress Notes (Signed)
Summary: Office Visit/MEDICINE HEALTH SCREEN  Office Visit/MEDICINE HEALTH SCREEN   Imported By: Arta Bruce 08/03/2010 15:55:55  _____________________________________________________________________  External Attachment:    Type:   Image     Comment:   External Document

## 2010-08-15 LAB — DRUGS OF ABUSE SCREEN W/O ALC, ROUTINE URINE
Amphetamine Screen, Ur: NEGATIVE
Marijuana Metabolite: NEGATIVE
Phencyclidine (PCP): NEGATIVE

## 2010-08-15 LAB — LIPID PANEL
LDL Cholesterol: 136 mg/dL — ABNORMAL HIGH (ref 0–99)
Total CHOL/HDL Ratio: 3.4 RATIO
Triglycerides: 177 mg/dL — ABNORMAL HIGH (ref <150)
VLDL: 35 mg/dL (ref 0–40)

## 2010-08-15 LAB — COMPREHENSIVE METABOLIC PANEL
ALT: 25 U/L (ref 0–53)
AST: 29 U/L (ref 0–37)
Albumin: 3.8 g/dL (ref 3.5–5.2)
Alkaline Phosphatase: 62 U/L (ref 39–117)
Potassium: 3.8 mEq/L (ref 3.5–5.1)
Sodium: 137 mEq/L (ref 135–145)
Total Protein: 6.7 g/dL (ref 6.0–8.3)

## 2010-08-15 LAB — URINALYSIS, ROUTINE W REFLEX MICROSCOPIC
Glucose, UA: NEGATIVE mg/dL
Ketones, ur: NEGATIVE mg/dL
pH: 6 (ref 5.0–8.0)

## 2010-08-15 LAB — BENZODIAZEPINE, QUANTITATIVE, URINE: Oxazepam GC/MS Conf: 310 ng/mL

## 2010-08-15 LAB — CBC
Platelets: 195 10*3/uL (ref 150–400)
RDW: 13.3 % (ref 11.5–15.5)

## 2010-08-16 LAB — CBC
HCT: 43.7 % (ref 39.0–52.0)
Hemoglobin: 14.9 g/dL (ref 13.0–17.0)
RBC: 4.86 MIL/uL (ref 4.22–5.81)
RDW: 13 % (ref 11.5–15.5)
WBC: 6.2 10*3/uL (ref 4.0–10.5)

## 2010-08-16 LAB — COMPREHENSIVE METABOLIC PANEL
Alkaline Phosphatase: 70 U/L (ref 39–117)
BUN: 21 mg/dL (ref 6–23)
CO2: 27 mEq/L (ref 19–32)
Chloride: 104 mEq/L (ref 96–112)
GFR calc non Af Amer: >60 mL/min (ref >60)
Glucose, Bld: 103 mg/dL — ABNORMAL HIGH (ref 70–99)
Potassium: 4.1 mEq/L (ref 3.5–5.1)
Total Bilirubin: 0.5 mg/dL (ref 0.3–1.2)

## 2010-08-19 LAB — URINE DRUGS OF ABUSE SCREEN W ALC, ROUTINE (REF LAB)
Amphetamine Screen, Ur: NEGATIVE
Cocaine Metabolites: NEGATIVE
Creatinine,U: 67.6 mg/dL
Opiate Screen, Urine: NEGATIVE

## 2010-08-20 LAB — URINE DRUGS OF ABUSE SCREEN W ALC, ROUTINE (REF LAB)
Amphetamine Screen, Ur: NEGATIVE
Amphetamine Screen, Ur: NEGATIVE
Amphetamine Screen, Ur: NEGATIVE
Barbiturate Quant, Ur: NEGATIVE
Barbiturate Quant, Ur: NEGATIVE
Barbiturate Quant, Ur: NEGATIVE
Benzodiazepines.: NEGATIVE
Benzodiazepines.: NEGATIVE
Benzodiazepines.: NEGATIVE
Benzodiazepines.: NEGATIVE
Cocaine Metabolites: NEGATIVE
Cocaine Metabolites: NEGATIVE
Creatinine,U: 67.9 mg/dL
Ethyl Alcohol: <10 mg/dL (ref <10)
Ethyl Alcohol: <10 mg/dL (ref <10)
Marijuana Metabolite: POSITIVE — AB
Marijuana Metabolite: NEGATIVE
Marijuana Metabolite: NEGATIVE
Methadone: NEGATIVE
Methadone: NEGATIVE
Methadone: NEGATIVE
Methadone: NEGATIVE
Opiate Screen, Urine: NEGATIVE
Opiate Screen, Urine: NEGATIVE
Phencyclidine (PCP): NEGATIVE
Phencyclidine (PCP): NEGATIVE
Propoxyphene: NEGATIVE
Propoxyphene: NEGATIVE
Propoxyphene: NEGATIVE

## 2010-08-20 LAB — COCAINE, URINE, CONFIRMATION: Benzoylecgonine GC/MS Conf: 15000 ng/mL

## 2010-08-20 LAB — THC (MARIJUANA), URINE, CONFIRMATION: Marijuana, Ur-Confirmation: 130 ng/mL

## 2010-08-21 LAB — RAPID URINE DRUG SCREEN, HOSP PERFORMED
Amphetamines: NOT DETECTED
Benzodiazepines: NOT DETECTED
Cocaine: POSITIVE — AB
Opiates: NOT DETECTED
Tetrahydrocannabinol: NOT DETECTED

## 2010-08-21 LAB — CBC
HCT: 44 % (ref 39.0–52.0)
Hemoglobin: 14.7 g/dL (ref 13.0–17.0)
MCHC: 33.5 g/dL (ref 30.0–36.0)
RBC: 4.76 MIL/uL (ref 4.22–5.81)
RDW: 13.7 % (ref 11.5–15.5)

## 2010-08-21 LAB — COMPREHENSIVE METABOLIC PANEL
ALT: 15 U/L (ref 0–53)
Alkaline Phosphatase: 59 U/L (ref 39–117)
BUN: 11 mg/dL (ref 6–23)
CO2: 30 mEq/L (ref 19–32)
GFR calc non Af Amer: >60 mL/min (ref >60)
Glucose, Bld: 99 mg/dL (ref 70–99)
Potassium: 4.2 mEq/L (ref 3.5–5.1)
Sodium: 139 mEq/L (ref 135–145)
Total Protein: 7.2 g/dL (ref 6.0–8.3)

## 2010-08-21 LAB — DIFFERENTIAL
Basophils Absolute: 0 10*3/uL (ref 0.0–0.1)
Basophils Relative: 0 % (ref 0–1)
Eosinophils Absolute: 0.1 10*3/uL (ref 0.0–0.7)
Monocytes Relative: 8 % (ref 3–12)
Neutro Abs: 4.1 10*3/uL (ref 1.7–7.7)
Neutrophils Relative %: 58 % (ref 43–77)

## 2010-08-21 LAB — ETHANOL: Alcohol, Ethyl (B): <5 mg/dL (ref 0–10)

## 2010-08-22 LAB — COMPREHENSIVE METABOLIC PANEL
ALT: 16 U/L (ref 0–53)
Alkaline Phosphatase: 88 U/L (ref 39–117)
CO2: 29 mEq/L (ref 19–32)
GFR calc non Af Amer: >60 mL/min (ref >60)
Glucose, Bld: 112 mg/dL — ABNORMAL HIGH (ref 70–99)
Potassium: 4 mEq/L (ref 3.5–5.1)
Sodium: 142 mEq/L (ref 135–145)
Total Bilirubin: 0.4 mg/dL (ref 0.3–1.2)

## 2010-08-22 LAB — URINALYSIS, ROUTINE W REFLEX MICROSCOPIC
Nitrite: NEGATIVE
Protein, ur: NEGATIVE mg/dL
Specific Gravity, Urine: 1.007 (ref 1.005–1.030)
Urobilinogen, UA: 0.2 mg/dL (ref 0.0–1.0)

## 2010-08-22 LAB — DIFFERENTIAL
Basophils Absolute: 0 10*3/uL (ref 0.0–0.1)
Basophils Relative: 0 % (ref 0–1)
Eosinophils Absolute: 0.1 10*3/uL (ref 0.0–0.7)
Eosinophils Absolute: 0.2 10*3/uL (ref 0.0–0.7)
Lymphs Abs: 3 10*3/uL (ref 0.7–4.0)
Monocytes Absolute: 0.6 10*3/uL (ref 0.1–1.0)
Monocytes Relative: 10 % (ref 3–12)
Neutrophils Relative %: 42 % — ABNORMAL LOW (ref 43–77)
Neutrophils Relative %: 46 % (ref 43–77)

## 2010-08-22 LAB — GENTAMICIN LEVEL, RANDOM: Gentamicin Rm: 1.5 ug/mL

## 2010-08-22 LAB — POCT I-STAT, CHEM 8
Calcium, Ion: 0.99 mmol/L — ABNORMAL LOW (ref 1.12–1.32)
Chloride: 106 mEq/L (ref 96–112)
Creatinine, Ser: 1.2 mg/dL (ref 0.4–1.5)
Glucose, Bld: 94 mg/dL (ref 70–99)
Hemoglobin: 15.3 g/dL (ref 13.0–17.0)
Potassium: 3.9 mEq/L (ref 3.5–5.1)

## 2010-08-22 LAB — CBC
HCT: 41.8 % (ref 39.0–52.0)
HCT: 41.9 % (ref 39.0–52.0)
Hemoglobin: 14.6 g/dL (ref 13.0–17.0)
Hemoglobin: 14.7 g/dL (ref 13.0–17.0)
MCHC: 35.1 g/dL (ref 30.0–36.0)
MCV: 91.3 fL (ref 78.0–100.0)
RBC: 4.57 MIL/uL (ref 4.22–5.81)
RBC: 4.59 MIL/uL (ref 4.22–5.81)
WBC: 6.5 10*3/uL (ref 4.0–10.5)

## 2010-08-22 LAB — CROSSMATCH

## 2010-08-22 LAB — RAPID URINE DRUG SCREEN, HOSP PERFORMED
Amphetamines: NOT DETECTED
Barbiturates: NOT DETECTED
Opiates: NOT DETECTED

## 2010-08-22 LAB — ABO/RH: ABO/RH(D): O POS

## 2010-09-06 ENCOUNTER — Emergency Department (HOSPITAL_COMMUNITY)
Admission: EM | Admit: 2010-09-06 | Discharge: 2010-09-07 | Disposition: A | Discharge status: Other Acute Inpatient Hospital | Payer: Self-pay | Attending: Emergency Medicine | Admitting: Emergency Medicine

## 2010-09-06 DIAGNOSIS — F3289 Other specified depressive episodes: Secondary | ICD-10-CM | POA: Insufficient documentation

## 2010-09-06 DIAGNOSIS — F329 Major depressive disorder, single episode, unspecified: Secondary | ICD-10-CM | POA: Insufficient documentation

## 2010-09-06 DIAGNOSIS — F1010 Alcohol abuse, uncomplicated: Secondary | ICD-10-CM | POA: Insufficient documentation

## 2010-09-06 LAB — RAPID URINE DRUG SCREEN, HOSP PERFORMED: Cocaine: NOT DETECTED

## 2010-09-06 LAB — BASIC METABOLIC PANEL
Chloride: 107 mEq/L (ref 96–112)
GFR calc Af Amer: >60 mL/min (ref >60)
Potassium: 4.4 mEq/L (ref 3.5–5.1)
Sodium: 139 mEq/L (ref 135–145)

## 2010-09-06 LAB — ETHANOL: Alcohol, Ethyl (B): 207 mg/dL — ABNORMAL HIGH (ref 0–10)

## 2010-09-06 LAB — CBC
MCH: 30.6 pg (ref 26.0–34.0)
MCV: 87.5 fL (ref 78.0–100.0)
Platelets: 298 10*3/uL (ref 150–400)
RDW: 13.7 % (ref 11.5–15.5)
WBC: 7.6 10*3/uL (ref 4.0–10.5)

## 2010-09-06 LAB — URINALYSIS, ROUTINE W REFLEX MICROSCOPIC
Glucose, UA: NEGATIVE mg/dL
Ketones, ur: NEGATIVE mg/dL
Nitrite: NEGATIVE
Protein, ur: NEGATIVE mg/dL

## 2010-09-06 LAB — DIFFERENTIAL
Eosinophils Absolute: 0.1 10*3/uL (ref 0.0–0.7)
Eosinophils Relative: 1 % (ref 0–5)
Lymphs Abs: 3.2 10*3/uL (ref 0.7–4.0)

## 2010-09-07 ENCOUNTER — Inpatient Hospital Stay (HOSPITAL_COMMUNITY)
Admission: EM | Admit: 2010-09-07 | Discharge: 2010-09-11 | DRG: 897 | Disposition: A | Discharge status: Home / Self Care | Payer: Self-pay | Attending: Psychiatry | Admitting: Psychiatry

## 2010-09-07 DIAGNOSIS — J449 Chronic obstructive pulmonary disease, unspecified: Secondary | ICD-10-CM

## 2010-09-07 DIAGNOSIS — F332 Major depressive disorder, recurrent severe without psychotic features: Secondary | ICD-10-CM

## 2010-09-07 DIAGNOSIS — F1994 Other psychoactive substance use, unspecified with psychoactive substance-induced mood disorder: Secondary | ICD-10-CM

## 2010-09-07 DIAGNOSIS — K219 Gastro-esophageal reflux disease without esophagitis: Secondary | ICD-10-CM

## 2010-09-07 DIAGNOSIS — J4489 Other specified chronic obstructive pulmonary disease: Secondary | ICD-10-CM

## 2010-09-07 DIAGNOSIS — F1020 Alcohol dependence, uncomplicated: Principal | ICD-10-CM

## 2010-09-07 DIAGNOSIS — F1410 Cocaine abuse, uncomplicated: Secondary | ICD-10-CM

## 2010-09-07 DIAGNOSIS — K5090 Crohn's disease, unspecified, without complications: Secondary | ICD-10-CM

## 2010-09-08 DIAGNOSIS — F10239 Alcohol dependence with withdrawal, unspecified: Secondary | ICD-10-CM

## 2010-09-08 NOTE — Consult Note (Signed)
  NAME:  Brendan Ochoa, Brendan Ochoa NO.:  [id number]  MEDICAL RECORD NO.:  [id number]           PATIENT TYPE:  E  LOCATION:  [facility]                         FACILITY:  [facility]  PHYSICIAN:  Eulogio Ditch, MD DATE OF BIRTH:  01/07/1976  DATE OF CONSULTATION:  09/07/2010 DATE OF DISCHARGE:                                CONSULTATION   REASON FOR CONSULTATION:  Alcohol abuse.  HISTORY OF PRESENT ILLNESS:  This 35 year old white male reported drinking 6 beers to a case during the day.  The patient reported psychosocial stressors along with depression.  The patient currently denies any suicidal or homicidal ideations.  Denies hearing any voices, is logical and goal directed, not delusional.  The patient also reported using crack on and off but not regularly and also abusing marijuana.  The patient denied any history of seizure disorder on alcohol withdrawal, reported one DUI on the New Year's Day.  Denied any blackouts.  LABORATORY DATA:  WBC count 7.6, hemoglobin 15.9.  UDS positive for marijuana.  Alcohol level 207.  Sodium 139, potassium 4.4, BUN 15, creatinine 0.9.  MENTAL STATUS EXAMINATION:  The patient is calm, cooperative during the interview, pleasant on approach.  No abnormal movements noticed.  Mood depressed.  Affect mood congruent.  Thought process logical and goal directed.  Speech normal in rate, rhythm and volume.  Thought content, not suicidal or homicidal, not delusional.  Thought perception, no audiovisual hallucination reported, not internally preoccupied. Cognition; alert, awake, oriented x3.  Memory; immediate, recent, remote fair.  Attention and concentration fair.  Abstraction ability good. Insight and judgment fair.  DIAGNOSIS:  AXIS I:  Chronic alcohol abuse. AXIS II:  Deferred. AXIS III:  History of asthma and Crohn's disease. AXIS IV:  Chronic alcohol abuse. AXIS V: 40.  RECOMMENDATIONS: 1. The patient will be admitted to [facility]. 2. The patient started on Librium detox protocol. 3. The patient will be started on his regular medical meds like     Ventolin, Flonase, Protonix. 4. The patient takes Pristiq 100 mg p.o. daily, I will not start     Pristiq at this time.     Eulogio Ditch, MD     SA/MEDQ  D:  09/07/2010  T:  09/07/2010  Job:  [id number]  Electronically Signed by Eulogio Ditch  on 09/08/2010 06:49:52 AM

## 2010-09-12 NOTE — Discharge Summary (Signed)
  NAME:  Brendan Ochoa, [place]AM[place] NO.:  [id number]  MEDICAL RECORD NO.:  [id number]           PATIENT TYPE:  I  LOCATION:  0307                          FACILITY:  BH  PHYSICIAN:  Anselm Jungling, MD  DATE OF BIRTH:  March 25, 1976  DATE OF ADMISSION:  09/07/2010 DATE OF DISCHARGE:  09/11/2010                              DISCHARGE SUMMARY   IDENTIFYING DATA/REASON FOR ADMISSION:  This was an inpatient psychiatric admission for Brendan Ochoa, a 35 year old Caucasian male who was admitted for treatment of alcohol dependence, crack cocaine abuse, and depression.  Please refer to the admission note for further details pertaining to the symptoms, circumstances and history that led to his hospitalization.  He was given an initial Axis I diagnosis of polysubstance dependence.  MEDICAL AND LABORATORY:  The patient was medically and physically assessed by the psychiatric nurse practitioner.  He was initially seen in the emergency department and medically cleared there.  He came to [name] with a history of Crohn's disease.  He was continued on his usual regimen of Pentasa.  He was also continued on his usual regimen of p.r.n., Advair, Ventolin, and Flonase for symptoms of asthma and COPD. He was continued on Protonix 40 mg daily for symptoms of GERD.  There were no other significant medical issues outside of his medical detoxification for alcohol dependence.  HOSPITAL COURSE:  The patient was admitted to the adult inpatient psychiatric service.  He presented as a well-nourished, normally- developed, friendly and upbeat male who readily acknowledged his substance abuse issues.  He verbalized a strong desire for help.  He was absent suicidal ideation throughout his stay.  The suicide risk assessment was completed on admission and discharge, and in both bases he was rated at minimal risk.  He was detoxified from alcohol dependence utilizing a Librium protocol. His detoxification was  uneventful.  He participated in therapeutic groups and activities including those geared towards 12-step recovery.  He was appropriate for discharge on the fifth hospital day.  He agreed to the following aftercare plan.  AFTERCARE:  The patient was to follow-up at the [facility].  DISCHARGE MEDICATIONS:  Effexor XR 150 mg daily, Advair p.r.n., Ventolin inhaler p.r.n., Flonase daily, Pentasa 500 mg q.i.d., Protonix 40 mg daily.  DISCHARGE DIAGNOSES:  AXIS I:  Alcohol dependence, crack cocaine abuse, depressive disorder NOS. AXIS II: Deferred. AXIS III: Crohn disease, asthma, seasonal allergies, GERD. AXIS IV: Stressors severe. AXIS V: GAF on discharge 50.     Anselm Jungling, MD     SPB/MEDQ  D:  09/11/2010  T:  09/11/2010  Job:  [id number]  Electronically Signed by Geralyn Flash MD on 09/12/2010 10:32:51 AM

## 2010-09-19 NOTE — H&P (Signed)
NAME:  Brendan Ochoa, Brendan Ochoa NO.:  [id number]  MEDICAL RECORD NO.:  [id number]           PATIENT TYPE:  I  LOCATION:  0307                          FACILITY:  BH  PHYSICIAN:  Anselm Jungling, MD  DATE OF BIRTH:  1975/10/06  DATE OF ADMISSION:  09/07/2010 DATE OF DISCHARGE:                      PSYCHIATRIC ADMISSION ASSESSMENT   IDENTIFYING INFORMATION:  This 35 year old single white male.  Patient presented to the [facility] emergency room requesting detox from alcohol.  The patient reports that he has been drinking 6 beers to 1 case of beer a day for the last 3 weeks. He has been sober for 6 to 7 months prior to Christmas and relapsed shortly before Christmas.  PAST PSYCHIATRIC HISTORY:  The patient has had multiple admissions to [facility] and other detox facility.  His most recent discharge from [facility] was in March of 2010.  He has been to [facility] x2, [facility], [facility] 3 to 4 months ago and has currently been an outpatient at the [facility] in [place]. He is also treated for depression as well.  SOCIAL HISTORY:  He is a single white male, never married.  He has no children.  However, his current girlfriend is pregnant with his first child and due in November.  He lives with his mother and father in [place].  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  LEGAL HISTORY:  A DUI he received on the year's day.  He has impending court date on July 23 of this year.  FAMILY HISTORY:  He has strong family history of alcohol abuse in a maternal aunt and uncle, maternal grandfather.  ALCOHOL AND DRUG HISTORY:  The patient has been drinking since age 53 and endorses smoking marijuana 2 to 3 times a week on and off for the last 15 years.  CURRENT MEDICAL PROVIDER:  [facility].  CURRENT MEDICAL PROBLEMS:  Include Crohn's, asthma,  gastroesophageal reflux disease and depression. 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  CURRENT MEDICATIONS:  Are per patient report 1. Pristiq 100 mg p.o. daily. 2. Pentasa. 3. Protonix. 4. Flonase. 5. Advair.  DRUG ALLERGIES:  None.  PHYSICAL FINDINGS:  The patient was seen and evaluated in the [facility] emergency room where he was evaluated by Orlene Och, MD.  Level was in her dentition to the past medical history.  Physical examination was unremarkable.  PERTINENT LABORATORIES:  Urine microscopic normal.  CBC with diff normal.  Urine drug screen was positive for marijuana only.  Blood alcohol level was 207.  Basic metabolic profile showed a glucose of 100 but was otherwise normal.  CURRENT MENTAL STATUS:  The patient is a well-developed, well-nourished white male in no apparent distress who appears his chronological age. He admits he feels bad, is currently in alcohol withdrawal.  He is casually dressed in tee shirt and jeans lying in bed.  His speech is clear  and coherent.  His eye contact is good and he is cooperative.  His mood is depressed with anxious affect.  He denies suicidality or homicidality and has had no previous suicide attempts.  Thought process is linear with no evidence of psychosis.  He is in full touch with reality without auditory or visual hallucinations and cognition is at least average.  ASSESSMENT:  AXIS I:  Alcohol dependence in early withdrawal, major depressive disorder recurrent, severe without psychosis.  Rule out substance-induced mood disorder. AXIS II:  Negative. AXIS III:  Crohn disease, asthma, gastroesophageal reflux disease and fourth finger amputation well healed. AXIS IV:  Current legal issues, pending court date on July 23 and expecting his first child. AXIS V:  Current GAF is 40, past year  unknown.  PLAN:  The patient will be admitted for detox and stabilization. Estimated length of stay is 3-5 days.    ______________________________ Verne Spurr, PA   ______________________________ Anselm Jungling, MD    NM/MEDQ  D:  09/08/2010  T:  09/08/2010  Job:  [id number]  Electronically Signed by Verne Spurr  on 09/18/2010 02:18:18 PM Electronically Signed by Geralyn Flash MD on 09/19/2010 10:51:11 AM

## 2010-09-26 NOTE — H&P (Signed)
NAME:  Brendan Ochoa, Brendan Ochoa NO.:  [id number]   MEDICAL RECORD NO.:  [id number]          PATIENT TYPE:  IPS   LOCATION:  4782                          FACILITY:  BH   PHYSICIAN:  Carlton [name], M.D.      DATE OF BIRTH:  December 27, 1975   DATE OF ADMISSION:  12/08/2008  DATE OF DISCHARGE:                       PSYCHIATRIC ADMISSION ASSESSMENT   IDENTIFICATION:  A 35 year old male.  This is a voluntary admission.   HISTORY OF PRESENT ILLNESS:  Third or fourth [facility] admission for this 83-  year-old with a history of polysubstance abuse.  He relapsed on alcohol  about one week ago, drinking small amounts and then gradually increasing  over the course of the past week.  Said that he would drink a little bit  during the day.  Sip at some beers and then take a couple home with him  to sneak at night.  Relapsed on cocaine at the same time he took the  first beer, snorting a line here and there most every day, but not  having enough money, he says, to go on a big binge.  He is involved in  our chemical dependency intensive outpatient program and the counselor  there realized he had relapsed and referred him for admission.  He  endorses some depression and frustration with himself for relapsing,  denying any active suicidal thoughts.   PAST PSYCHIATRIC HISTORY:  A 35 year old with a history of cocaine abuse  and alcoholism for several years.  Has history of two previous  admissions to [facility] for suicidal thoughts.  Has also  been admitted in the past to [facility] for substance abuse and  mood treatment and [facility] as an outpatient.  Most recently followed at  [facility] by Dr. Carlton [name].  He has been generally compliant with  medications.   SOCIAL HISTORY:  A 35 year old male.  The youngest of three children,  currently living in [place], [place] with his parents.  Previously, was  at an [facility], but was unable to tolerate the  living situation because of the lack of cleanliness of the facility.  Was obsessing about this.  Challenged by coping with recent medical  problems including a traumatic amputation of the right ring finger and  portions of his right thumb with a skill saw.  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  He works as a Games developer.  No current legal problems.   FAMILY HISTORY:  No family history of mental illness or substance abuse.   MEDICAL HISTORY:  No regular primary care Madisynn Plair.  Most recently  followed by Dr. Roseanne Kaufman for hand surgery.   MEDICAL PROBLEMS:  Crohn's and asthma.   CURRENT MEDICATIONS:  1. Cymbalta 30 mg daily.  2. Mesalamine 400 mg p.o. t.i.d.  3. Protonix 40 mg daily.  4. Lamictal 25 mg p.o. daily.  5. Gabapentin 200 mg t.i.d.  6. Advair discus 250/50 one puff b.i.d.  7. Albuterol MDI 2 puffs q.4 h p.r.n. for  allergy.  8. Abilify 2 mg daily.   DRUG ALLERGIES:  None.   PHYSICAL EXAMINATION:  Done and noted in the record.  This is a healthy-  appearing male who is fully alert today, coherent, good eye contact and  denying any dangerous thoughts, endorsing some issues with mood  fluctuations, some depression, issues with getting very obsessed and  having difficulty at the [facility], living around others, focusing on  cleanliness.  Denying any active suicidal thoughts.  Insight is fair.  Speech is normal.  Gives a coherent history.  Memory intact.   DISCHARGE DIAGNOSES:  AXIS I:  Mood disorder NOS.  Alcohol abuse, rule  out dependence.  Cocaine abuse.  AXIS II:  Deferred.  AXIS III:  Asthma, Crohn's disease, status post traumatic amputation  right ring finger and a traumatic injury, right thumb June 2010.  AXIS IV:  Supportive family.  AXIS V:  Current is 46, past year 62.   PLAN:  Estimated plan is to voluntarily admit him with a goal of a  safe  detox and stabilize his mood, work on relapse prevention.  We are going  to continue his Abilify and his other routine medications.  At this  point, he does have Librium 25 mg q.6 h p.r.n. for any symptoms of  withdrawal.  Participation in our dual diagnosis group has been good.      Margaret A. Scott, N.P.      Carlton [name], M.D.  Electronically Signed    MAS/MEDQ  D:  12/09/2008  T:  12/09/2008  Job:  [id number]

## 2010-09-26 NOTE — H&P (Signed)
NAME:  Brendan Ochoa, Brendan Ochoa NO.:  [id number]   MEDICAL RECORD NO.:  [id number]          PATIENT TYPE:  IPS   LOCATION:  0503                          FACILITY:  BH   PHYSICIAN:  Carlton [name], M.D.      DATE OF BIRTH:  Jan 02, 1976   DATE OF ADMISSION:  04/01/2007  DATE OF DISCHARGE:                       PSYCHIATRIC ADMISSION ASSESSMENT   IDENTIFYING INFORMATION:  A 35 year old white male voluntary admission.   HISTORY OF PRESENT ILLNESS:  This patient presented in the emergency  room accompanied by his parents complaining of gradual increase in  depressed mood with suicidal thoughts over the course of the past week.  He has been homeless.  He had been staying in a motel room.  Parents  brought him to the emergency room because they were increasingly worried  about him.  Having suicidal thoughts without a clear plan and admits to  having suicidal thoughts on and off over the course of the past 10 years  during which he was also actively abusing a variety of substances.  Currently he has been abusing, taking 1-2 tablets of opiates or  benzodiazepines, Vicodin or Xanax and various other types of pills  several times a week and using cocaine most days out of the week and  last used 2 grams of cocaine on April 01, 2007.  Alcohol use is rare.  Also complaining of severe racing thoughts at times, frequent panic  attacks.  Denying any homicidal thoughts or hallucinations.  Expressing  a strong  sense of hopelessness and helplessness.  He is a laid Building control surveyor.  Unable to get further employment.  He has no stable place to live.  He  is financially dependent on his parents at this time and has been  actively polysubstance abusing for the past 10 years.   PAST PSYCHIATRIC HISTORY:  First inpatient admission to [facility]  He has a history of being in rehab at  [facility] most recently.  Also did a residential program with a  religious  community that lasted about 10 months and also was at a  [place] rehab facility from 2004-2005.  He has a history of being  diagnosed with ADHD and was previously treated with Adderall.  He  completed school up through the tenth grade and then went back to school  to get his GED.  He endorses a history of suicidal thoughts but denies  any prior attempts.  Has been abusing substances for about 10 years.  Some periods of abstinence include those periods during which he was  actively enrolled in programs.  Otherwise independent abstinence is  unclear.   SOCIAL HISTORY:  Single, unemployed white male, currently homeless.  Parents do live in the area and have been supportive helping sponsor him  for various stays in the past.  Are concerned about his safety.  The  patient has no current legal charges.  Never married.  No children.   FAMILY HISTORY:  Remarkable for father with history of alcohol abuse.   MEDICAL HISTORY:  The patient is followed by Dr. Lysle Pearl at [facility] and  by Dr. Benson Norway, his  gastroenterologist.  The patient has a  history of asthma and recently diagnosed with Crohn's disease.   MEDICATIONS:  1. Advair 100/50 one puff b.i.d.  2. Albuterol inhaler 2 puffs q.4h. p.r.n. for asthma.  3. Asacol 400 mg, 2 tablets t.i.d.  4. Prednisone 10 mg daily.  5. Aciphex 1 tablet daily.   DRUG ALLERGIES:  NONE.   POSITIVE PHYSICAL FINDINGS:  The patient is a well-nourished, well-  developed 35 year old who looks younger than his stated age.  Generally  healthy-appearing, 5 feet 11 inches tall, 135 pounds, temperature 97,  pulse 76, respirations 22, blood pressure 111/64.  Full physical exam  was done by Dr. Verneda Skill and was generally unremarkable.   DIAGNOSTIC STUDIES:  A urine drug screen was positive for cocaine and  tetrahydro cannabinoids.  Chemistry; sodium 138, potassium 4.3, chloride  100, carbon dioxide 29, BUN 18, creatinine 1.17 and random glucose 142.  His  alcohol level was less than 5.  Vital signs within normal limits.  CBC unremarkable.  Platelets 247,000 and MCV 87.5, WBC 7.7, hemoglobin  16.7.   MENTAL STATUS EXAM:  Fully alert male, pleasant cooperative, soft-  spoken.  He is polite.  Eye contact is satisfactory.  Speech normal in  pace, tone and production.  Mood depressed.  Thought process logical.  Has a fair amount of shame from his just being unemployed.  Feels  hopeless about the future.  Positive suicidal thoughts, thinking that he  might just die by taking enough of the substances that he is using.  No  specific homicidal thoughts.  No fornication.  No acute evidence of  withdrawal at this point.  No homicidal thoughts.  No evidence of  psychosis.  Insight is adequate.  Impulse control and judgment within  normal limits.  Cognition preserved.   AXIS I:  Depressive disorder NOS.  Cocaine abuse, rule out dependence,  polysubstance abuse.  AXIS II:  Deferred.  AXIS III:  Asthma and Crohn's disease.  AXIS IV:  Severe issues with homelessness.  AXIS V:  Current 30, past year unknown.   PLAN:  Voluntarily admit the patient with q. 15-minute checks in place.  We are hoping to get his parents involved and get some additional  history.  Meanwhile we are going to get a hepatic function panel and a  TSH and free T4 and start him on multivitamins and we are going to start  him on Symmetrel 100 mg p.o. b.i.d. and trazodone 50 mg h.s. p.r.n.  insomnia.  We have enrolled him in the dual diagnosis programming and he  has been appropriate with peers and staff.  Estimated length of stay is  5 days.      Margaret A. Scott, N.P.      Carlton [name], M.D.  Electronically Signed    MAS/MEDQ  D:  04/02/2007  T:  04/03/2007  Job:  [id number]

## 2010-09-26 NOTE — Discharge Summary (Signed)
NAME:  Brendan Ochoa, Brendan Ochoa NO.:  [id number]   MEDICAL RECORD NO.:  [id number]          PATIENT TYPE:  IPS   LOCATION:  0503                          FACILITY:  BH   PHYSICIAN:  Carlton [name], M.D.      DATE OF BIRTH:  [birth year]   DATE OF ADMISSION:  11/02/2008  DATE OF DISCHARGE:  11/09/2008                               DISCHARGE SUMMARY   CHIEF COMPLAINT AND PRESENT ILLNESS:  This was one of several admissions  to [place] for this 35 year old male voluntarily  admitted.  Presented with depressed mood was at __________ [facility] after 2  weeks abstinence.  Relapsed on alcohol and cocaine x1 day, 3 days prior  to this admission.  Endorsed a mood fluctuation of depression even when  he is not abusing substances.  Endorsed anxiety.   PAST PSYCHIATRIC HISTORY:  1. History of cocaine abuse.  2. Second time at [place].  3. Most recent was at the [facility] treatment center.  4. History of persistent use of alcohol and cocaine.   MEDICAL HISTORY:  1. Crohn disease.  2. Status post traumatic amputation of fourth finger, injury to right      thumb on May 7.  3. Asthma.   MEDICATIONS:  Celexa 40 mg per day.   PHYSICAL EXAMINATION:  Compatible with the above described lesions.   MENTAL STATUS EXAM:  Reveals an alert cooperative male.  Mood anxious.  Affect anxious.  Thought processes logical, coherent and relevant.  Endorsed feeling overwhelmed with everything that is going on.  Endorsed  that he really wants sobriety which he had has not able to achieve it.  No active suicidal or homicidal ideas, no hallucinations.  No delusions.  Cognition well-preserved.   AXIS I:  1.  Cocaine and alcohol abuse, rule-out dependence.  2.  Depressive disorder not otherwise specified.  3.  Attention deficit  hyperactivity disorder.  AXIS II:  No diagnosis.  AXIS III:  1.  Crohn disease.  2.  Status post traumatic amputation,  right fourth finger and status  post right thumb trauma.  AXIS IV:  Moderate.  AXIS V:  GAF on admission 35. GAF in the last year 18.   COURSE IN THE HOSPITAL:  He was admitted. Started in individual and  group psychotherapy.  As already stated a 35 year old male with history  of polysubstance dependence.  Multiple attempts to treat with chronic  relapses.  Endorsed that he knows he needs to do something and says he  has had abstinence.  He experienced mood fluctuation, mostly depressed.  Feels that his baseline is depressed.  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  Recently,  more he said he now has to deal with the situation with the finger.  He  had been in a program in [place] and [place] [facility] [place] [employer].  As of recently Celexa, has been diagnosed with ADHD.  On June  24,  able to share a long history of dysfunction and increased anxiety,  but also depression with some hyperthymic episode with increased energy,  racing thoughts.  Celexa up to 40 mg not working.  We considered options  such as Prozac, Celexa, Paxil, Lexapro, Wellbutrin,  Seroquel, lithium,  Depakote.  Reconsidered Neurontin to help some with a neuropathic pain  and the anxiety.  Also looking into Abilify, Cymbalta and maybe  Lamictal. On June 25, we met with Dian Situ and his mother and his long  history of dysfunction.  Mood fluctuation. Grandfather diagnosed with  bipolar.  Several medication trials, but he has not given the  medications enough time to work.  Also, has diagnosis of OCD.  He admits  to active symptoms of checking and rechecking.  No complete remission of  symptoms of the OCD using Prozac or Celexa.  We pursued Abilify and  Lamictal.  He was dealing some with the pain.  On June 28,  still  throbbing pain of the finger.  He tolerated the Neurontin well.  We  increased Neurontin to 200 three  times a  day.  Overall, there was  improvement.  He was looking into going to an __________ House.  His  mood was definitely better.  Affect was brighter although, dealing with  the limitations imposed by the finger amputation.  He was hopeful.  We  went ahead and discharged to [facility].   DISCHARGE DIAGNOSES:  AXIS I:  1.  Alcohol and cocaine dependence.  2.  Mood disorder, not otherwise specified.  3.  Obsessive-compulsive  disorder.  4.  Social anxiety.  AXIS II:  No diagnosis.  AXIS III:  1.  Crohn disease.  2.  Status post traumatic amputation of  the fourth finger.  AXIS IV:  Moderate.  AXIS V:  GAF on discharge 50.   DISCHARGED ON:  1. Cymbalta 30 mg for 2 more days then increase to 60.  2. Abilify 2.5 mg per day.  3. Lamictal 25 mg was planned to increase to 50.  4. Neurontin 300 mg three times a day.  5. Trazodone 100 mg at bedtime for sleep.  6. Ultram 50 mg one to two every 4-6 hours as needed for pain.   Follow-up up [facility] and mental health, Dr. Darylene Price.      Carlton [name], M.D.  Electronically Signed     IL/MEDQ  D:  12/09/2008  T:  12/09/2008  Job:  [id number]

## 2010-09-26 NOTE — Op Note (Signed)
NAME:  Brendan Ochoa, Brendan Ochoa NO.:  [id number]   MEDICAL RECORD NO.:  [id number]          PATIENT TYPE:  OBV   LOCATION:  0998                         FACILITY:  [facility]   PHYSICIAN:  Satira Anis. Gramig, M.D.DATE OF BIRTH:  1975/09/19   DATE OF PROCEDURE:  09/17/2008  DATE OF DISCHARGE:                               OPERATIVE REPORT   PREOPERATIVE DIAGNOSIS:  Skill saw injury to the right hand with  amputations of a portions of the thumb and near-complete amputation to  the ring finger with severe soft tissue disarray about both areas.   POSTOPERATIVE DIAGNOSIS:  Skill saw injury to the right hand with  amputations of a portions of the thumb and near-complete amputation to  the ring finger with severe soft tissue disarray about both areas.   PROCEDURES:  1. Irrigation and debridement, excisional in nature, skin and      subcutaneous tissue, bone, and tissue of right thumb.  2. Irrigation and debridement.  This was an excisional debridement of      skin, subcutaneous tissue, bone, and tendon of right ring finger.  3. Amputation with bilateral neurectomies, right ring finger secondary      to mutilating skill saw injury with dysvascular portions.  4. Interphalangeal joint fusion, right thumb.  This was a primary      fusion performed secondary to skeletal disarray of the joint.      Autologous bone graft was used for this.  5. Flexor pollicis longus repair, right thumb.  6. Stress radiography, right thumb.   SURGEON:  Satira Anis. Amedeo Plenty, MD   ASSISTANT:  Avelina Laine, [facility]   COMPLICATIONS:  None.   ANESTHESIA:  General.   TOURNIQUET TIME:  Less than an hour.   INDICATIONS FOR THE PROCEDURE:  This patient is a 35 year old male who  presents with the above mentioned diagnosis.  He was using a skill saw  today and sustained a mutilating injury.  I have counseled him with  regards to risks and benefits surgery including risk of infection,  bleeding, anesthesia, damage  to normal structures, and failure of  surgery to accomplish its intended goals of relieving symptoms and  restoring function.  With this mind, he desired to proceed.  All  questions have been encouraged and answered preoperatively.   OPERATIVE PROCEDURE:  The patient with Omnicef, anesthesia, taken to the  operative suite.  Arm marked, consent verified and discussed at length  with the patient and his family.  He was taken to the operative suite.  Preoperative antibiotics were addressed.  He was placed supine, fully  padded, and prepped and draped in usual sterile fashion with Betadine  scrub and paint about the right upper extremity.  General anesthesia was  secured of course.  Once this was done, I then performed I&D of skin,  subcutaneous tissue, and bone as well as soft tissue that was  devitalized about the ring finger.  I then performed similar procedure  about the thumb.  The thumb underwent I&D of skin, subcutaneous tissue,  bone, and tendinous tissue.  He tolerated this well.  Following this,  greater  than 6 L of saline were placed through the areas to prevent  infection.   Once this was accomplished, it was very apparent that the thumb IP joint  was significantly obliterated with cartilaginous destruction.  Thus, I  chose to perform a primary thumb fusion.  I did so by creating cup and  cone opposing surfaces, and then fusing the thumb joint.  Remnants of  bone and cancellous bone graft were taken and packed tightly in the  interspace to serve as a primary bone graft.  I then performed flexor  pollicis longus tendon repair to try to give him a better forceful grip  and keep pinch capabilities given the soft tissue disarray.  This was  done with FiberWire of the 4-0 variety.  He tolerated this well.  I then  performed advancement of flap as necessary and closed the wound with  tourniquet deflated and hemostasis adequate.  The pins were placed below  the skin surface and stress  radiography revealed excellent position of  the thumb fusion in slight flexion.  I was pleased with the findings.  It was then irrigated.  Tourniquet was deflated and recirculation time  was allowed, which looked excellent.  I was pleased with the findings.   Following this, I then performed a ring finger I&D once again.  He had  significant mutilating injury, severe soft tissue disarray, and thus a  primary amputation with bilateral neurectomies was performed.  He had  segmental injury about the DI P and PIP joint with fractures at the  proximal phalanx, middle phalanx, and portions of the distal phalanx.  This unfortunately was highly irregular, comminuted complex, and  incapable of meaningful repair to provide him a normal finger.  Given  his occupation, we chose for the amputation.  This was done without  difficulty.  Bilateral neurectomies were performed and digital artery  was cauterized proximally.  Bone was reshaped, filed down, and the  patient tolerated this well.  I was pleased with the findings.  X-rays  were taken revealing adequate position.  The MP joint was preserved.  He  had small amount of proximal phalanx, which we left in place.   Following this, I then performed closure of the wound with combination  of Prolene and chromic suture.  The patient tolerated this well.  Once  this was done, we then performed very careful and meticulous dressing  application with volar plaster splint.  Tourniquet time was less than an  hour.  He tolerated the procedure well and there were no complicating  features with his surgery.  He will be monitored in recovery room and  will be admitted for IV antibiotics, general postop observation, pain  management, and other measures.  At the time of first postop visit, I am  going to AP and lateral x-rays of the thumb and hand and a careful  dressing change.  I have discussed with him these issues at length, the  do's and do not's etc.  This was  a terrible mangling injury and  reconstruction went reasonably well, we will need to keep a very close  eye on him into the future and he understands this.  All questions have  been encouraged and answered.      Satira Anis. Amedeo Plenty, M.D.  Electronically Signed     WMG/MEDQ  D:  09/17/2008  T:  09/18/2008  Job:  [id number]

## 2010-09-26 NOTE — H&P (Signed)
NAME:  Brendan Ochoa, Brendan Ochoa NO.:  [id number]   MEDICAL RECORD NO.:  [id number]          PATIENT TYPE:  IPS   LOCATION:  0302                          [facility]   PHYSICIAN:  Carlton [name], M.D.      DATE OF BIRTH:  Nov 03, 1975   DATE OF ADMISSION:  12/05/2007  DATE OF DISCHARGE:                       PSYCHIATRIC ADMISSION ASSESSMENT   This is a 35 year old male voluntarily admitted on December 05, 2007.   HISTORY OF PRESENT ILLNESS:  The patient presents with a history of  substance use.  He relapsed immediately on alcohol after his rehab stay  at [facility].  He also reports using benzodiazepines, cocaine and  marijuana.  He was experiencing withdrawal symptoms, having passive  suicidal thoughts, saying that he will die if he keeps using like this.  He is interested in a long-term rehab program.   PAST PSYCHIATRIC HISTORY:  The patient was here in December of 2008 for  similar circumstances.  He was just released from [place].  Also was  in [facility] prior for substance use.   SOCIAL HISTORY:  A 35 year old male who lives with his mother.   FAMILY HISTORY:  Unknown.   ALCOHOL AND DRUG HISTORY:  As above, using alcohol primarily along with  cocaine, marijuana and benzodiazepines.   MEDICAL PROBLEMS:  1. Asthma.  2. Crohn's disease.   MEDICATIONS:  1. Asacol 400 mg t.i.d.  2. Advair 250/50 twice daily.  3. Ventolin as needed.  4. Seroquel 50 mg t.i.d.  5. Celexa 40 mg daily.  6. Protonix 40 mg every day.   DRUG ALLERGIES:  No known allergies.   PHYSICAL EXAMINATION:  GENERAL:  This is a well-nourished male who was  assessed at [facility] emergency department.  His physical exam was  reviewed.  No significant findings.  He appears as a well-nourished,  well-developed male in no acute distress.  VITAL SIGNS:  Temperature 97.4, 69 heart rate, 20 respirations, blood  pressure is 123/65.   LABORATORY DATA:  CMET within normal limits.  Alcohol level of  117.  Urine drug screen is positive for cocaine, positive for benzodiazepines  and positive for THC.   MENTAL STATUS EXAM:  This is a fully alert, cooperative male.  He is  calm, resting in bed.  Speech is clear, normal pace and tone.  The  patient's mood is depressed.  The patient is sad and somewhat  constricted.  Thought processes are coherent.  No evidence of any  delusional thinking.  Denies any suicidal thoughts.  Cognitive function  intact.  His memory is good.  Insight is limited.  The patient has had  multiple detoxes for substance use with no lengthy period of sobriety.   ASSESSMENT:  AXIS I:  Polysubstance dependence, rule out substance-  induced mood disorder.  AXIS II:  Deferred.  AXIS III:  Asthma, Crohn's disease.  AXIS IV:  Psychosocial problems related to chronic substance use.  AXIS V:  Current is 40-45.   PLAN:  Plan to detox the patient with Librium protocol.  Will work on  relapse prevention.  Will at this time continue with patient's  Celexa  and Seroquel.  The patient will be in the red group.  Will consider a  family session with the patient's support system, which appears to be  his mother.  Case manager will assess the possibility of long-term rehab  program.   ESTIMATED LENGTH OF STAY:  His estimated length of stay at this time is  3-5 days.      Redgie Grayer, N.P.      Carlton [name], M.D.  Electronically Signed    JO/MEDQ  D:  12/09/2007  T:  12/09/2007  Job:  [id number]

## 2010-09-29 NOTE — Discharge Summary (Signed)
NAME:  Brendan Ochoa, Brendan Ochoa NO.:  [id number]   MEDICAL RECORD NO.:  [id number]          PATIENT TYPE:  IPS   LOCATION:  0503                          [facility]   PHYSICIAN:  Carlton [name], M.D.      DATE OF BIRTH:  1975/10/24   DATE OF ADMISSION:  04/01/2007  DATE OF DISCHARGE:  04/04/2007                               DISCHARGE SUMMARY   CHIEF COMPLAINT:  This was the first admission to [facility] for this 35 year old single white male voluntarily admitted.  Endorsed gradually increasing depressed mood, suicidal thoughts over the  course of the past week.  He had been homeless staying in [place] rooms.  He had been actively abusing substances for last 10 years, as of lately  taking 1-2 tablets of opiates or benzodiazepines, Vicodin or Xanax and  various other type of pills several times daily, using cocaine.  Was  endorsing severe racing thoughts at times, to having panic attacks.  Strong sense of hopeless and helplessness.  He has been currently laid  off.  No stable place to live.   PAST MEDICAL HISTORY:  First time at [facility].  He was in rehab  in  [facility].  He was also in residential, problem with the religious  Community that lasted about 10 months.  Patient was employed in rehab  facility from 2004 to 2005.  History of having been diagnosed with ADHD,  previously on Adderall.   DRUG HISTORY:  As already stated persistent use of substances, several  attempts to abstain.   MEDICAL HISTORY:  Asthma, recently diagnosed with Crohn disease.   MEDICATIONS:  1. Advair 100/50 1 puffs twice a day.  2. Albuterol inhaler, 2 puffs every 4 hours as needed.  3. Asacol 400 mg 2 tablets 3 x day.  4. Prednisone 10 mg a day.  5. Aciphex 2 tablets daily.   Physical exam performed, showed no acute findings.   LABORATORY WORK:  SGOT 16, SGPT 15, total bilirubin 0.8, TSH 0.559.  RPR  nonreactive.  Sodium 138, potassium 4.3, BUN 18, creatinine  1.17,  glucose of 142, CBC within normal limits.  White blood cells 7.7, mean  corpuscular volume 97.5, hemoglobin 16.7.  Exam reveals a fully alert  cooperative male who was soft spoken, polite, eye contact is  satisfactory.  Speech is normal in pace, tone and production.  Mood  depressed.  Affect depressed.  Endorsed a lot of issues going on in his  life that are stressful.  Somewhat hopeless about his future, suicidal  thoughts.  Thinking that he might just go ahead and die while taking  more of the pills that he was abusing.  Nonspecific homicidal thoughts.  No active hallucinations.  No delusions.  Cognition well-preserved.   AXIS I: Cocaine dependence, opiate, benzodiazepine abuse, rule out  dependence.  AXIS II:  Depressive disorder not otherwise specified.  AXIS III: Asthma, Crohn disease.  AXIS IV: Moderate.  AXIS V:  Global assessment of function 30, last year 44.   COURSE IN THE HOSPITAL:  Was admitted, started individual and group  psychotherapy.  He was maintained on his medications, was eventually  started on Celexa.  As already stated 35 year old male single,  unemployed was admitted due to increased signs and symptoms of  depression.  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  He had suicidal  ideations with uncontrolled use of multiple substances.  Cocaine, crack,  marijuana, benzos.  Endorsed some alcohol use, beers up to 6 beers a  couple of times a week.  Endorsed worsening of his depression since he  was diagnosed with Crohn's disease.  Had a bout of acute pain requiring  ED visit times five to six times in the last couple of weeks until he  was finally diagnosed.  Endorsed sense of hopeless and helplessness,  been unemployed.  He was laid off. No stable place to stay.  Financial  difficulties.  Active addiction for most of the last 10 years.  As  already stated he was in [facility] rehab for 28 days as well as  religious Community for 10 months to 2004-2005.  Went  up to the 10th  grade, and came back and got his GED.  Early on he was tested diagnosed  with ADHD has been on Adderall.  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  He did endorse that had taken the Celexa successfully in the past and  endorsed a little of anxiety as social component.  Endorsed negative  thoughts, depth perception distortion.  Endorsed difficulty to focus,  concentrate, read and retained.  Endorsed than when he uses drugs at  least he is not thinking about all these negative things in his life.  On November 21, he was wanting to leave the hospital.  Michela Pitcher he was going  to stay with his sister.  Endorsed that his employer was going to be  able to help him out as soon as he gets work for him.  Endorsed he was  planning to go back to church and work recovery through [employer].  Upon  discharge, improved contact with reality, no suicidal ideations, willing  and motivated to pursue outpatient follow-up.   DISCHARGE DIAGNOSES:  1. AXIS I: Cocaine dependence, benzodiazepines, opiates, marijuana      abuse, depressive disorder not otherwise specified.  2. AXIS II: No diagnosis.  3. AXIS III:  Asthma, Crohn disease.  4. AXIS IV: Moderate.  5. AXIS V:  Upon discharge 55.   DISCHARGE MEDICATIONS:  1. Asacol 400 mg 2 tablets three times a day.  2. Prednisone 10 mg per day.  3. Aciphex.  4. Celexa 20 mg per day.   FOLLOW UP:  [facility].      Carlton [name], M.D.  Electronically Signed     IL/MEDQ  D:  05/09/2007  T:  05/10/2007  Job:  [id number]

## 2010-09-29 NOTE — Discharge Summary (Signed)
NAME:  Brendan Ochoa, Brendan Ochoa NO.:  [id number]   MEDICAL RECORD NO.:  [id number]          PATIENT TYPE:  IPS   LOCATION:  0302                          [facility]   PHYSICIAN:  Carlton [name], M.D.      DATE OF BIRTH:  1975-05-29   DATE OF ADMISSION:  12/05/2007  DATE OF DISCHARGE:  12/09/2007                               DISCHARGE SUMMARY   CHIEF COMPLAINT:  This was the second admission to [facility] for this 35 year old male voluntarily admitted.  History of  substance abuse relapsed immediately on alcohol after his rehab stay at  [facility].  He also reports using benzodiazepines, cocaine and  marijuana, experiencing withdrawal, having some passive suicidal  thoughts, saying that he would die if he keeps using like this.  Interested a long-term program.   PSYCHIATRIC HISTORY:  He was seen in [facility] December  2008.  He was released from [facility] and also was in [facility] prior  for substance abuse.  As already stated, persistent use of substances  mainly alcohol with cocaine, marijuana and benzodiazepines.   MEDICAL HISTORY:  Asthma and Crohn disease.   MEDICATION:  1. Asacol 400 mg three times a day.  2. Advair 250-50 twice a day.  3. Ventolin as needed.  4. Seroquel 50 mg 3 times a day.  5. Celexa 40 mg per day.  6. Protonix 40 mg per day.   PHYSICAL EXAMINATION:  Failed to show any acute findings.   LABORATORY WORK:  CBC within normal limits, CMET within normal limits.  Alcohol level 117.  UDS positive for cocaine, benzodiazepines and  marijuana.   MENTAL STATUS EXAM:  Reveals alert cooperative male in bed.  Speech was  clear, normal rate, tempo and production.  Mood depressed.  Affect  depressed.  Processes logical, coherent and relevant.  Endorsed that he  wants to do something with his life, a sense of hopelessness and  helplessness, insightful in the same that he cannot continue to go on  like this.  He  denies any active suicidal or homicidal ideas, no  evidence of delusions, hallucinations.  Cognition was well-preserved.   ADMISSION DIAGNOSES:  AXIS I:  Polysubstance dependence.  Depressive  disorder not otherwise specified.  AXIS II:  No diagnosis.  AXIS III.  Asthma, Crohn disease.  AXIS IV:  Moderate.  AXIS V.  Per admission 35-48, in the last year 22.   COURSE IN THE HOSPITAL:  He was admitted.  He was started on individual  and group psychotherapy as already stated.  He was admitted due to  relapse into drugs, benzodiazepines, cocaine, marijuana and alcohol.  He  recently completed [address] could not stay sober.  Endorsed anxiety,  shakes and worries.  Also endorsed feeling hopeless, helpless,  worthless.  On July 27, he continued to work on himself, wanting to get  his life back together.  He endorsed he needed residential treatment and  he wants to ensure he has a good plan in place.  Insightful in that he  is affecting not only himself but  his family and he did not want his  mother to go through this over again.  He continued to work on  identifying a place.  He considered the different options and he was  going to go to [facility] in [place] and continue to work through  that program. So July 28 he was in full contact with reality, had  secured a place in [place].  He was going to make arrangements to get  there, mother was going to take him.  Endorsed he was really tired of  relapsing and willing and motivated to pursue outpatient treatment.   DISCHARGE DIAGNOSES:  AXIS I:  Polysubstance dependence, anxiety  disorder not otherwise specified.  Depressive disorder not otherwise  specified.  AXIS II.  No diagnosis.  AXIS III.  Asthma and Crohn disease.  AXIS IV:  Moderate.  AXIS V.  On discharge 59.   DISCHARGE MEDICATIONS:  1. Protonix 40 mg per day.  2. Celexa 20 mg per day.  3. Asacol 400 mg one three times a day.   FOLLOW-UP:  At [facility] in  [place], [place].      Carlton [name], M.D.  Electronically Signed     IL/MEDQ  D:  01/06/2008  T:  01/07/2008  Job:  [id number]

## 2010-10-22 ENCOUNTER — Emergency Department (HOSPITAL_COMMUNITY)
Admission: EM | Admit: 2010-10-22 | Discharge: 2010-10-23 | Disposition: A | Discharge status: Other Acute Inpatient Hospital | Payer: Self-pay | Attending: Emergency Medicine | Admitting: Emergency Medicine

## 2010-10-22 ENCOUNTER — Emergency Department (HOSPITAL_COMMUNITY): Payer: Self-pay

## 2010-10-22 ENCOUNTER — Encounter (HOSPITAL_COMMUNITY): Payer: Self-pay

## 2010-10-22 DIAGNOSIS — M25569 Pain in unspecified knee: Secondary | ICD-10-CM | POA: Insufficient documentation

## 2010-10-22 DIAGNOSIS — J45909 Unspecified asthma, uncomplicated: Secondary | ICD-10-CM | POA: Insufficient documentation

## 2010-10-22 DIAGNOSIS — Y998 Other external cause status: Secondary | ICD-10-CM | POA: Insufficient documentation

## 2010-10-22 DIAGNOSIS — M549 Dorsalgia, unspecified: Secondary | ICD-10-CM | POA: Insufficient documentation

## 2010-10-22 DIAGNOSIS — M542 Cervicalgia: Secondary | ICD-10-CM | POA: Insufficient documentation

## 2010-10-22 DIAGNOSIS — M79609 Pain in unspecified limb: Secondary | ICD-10-CM | POA: Insufficient documentation

## 2010-10-22 DIAGNOSIS — R45851 Suicidal ideations: Secondary | ICD-10-CM | POA: Insufficient documentation

## 2010-10-22 DIAGNOSIS — F1910 Other psychoactive substance abuse, uncomplicated: Secondary | ICD-10-CM | POA: Insufficient documentation

## 2010-10-22 DIAGNOSIS — F329 Major depressive disorder, single episode, unspecified: Secondary | ICD-10-CM | POA: Insufficient documentation

## 2010-10-22 DIAGNOSIS — K5090 Crohn's disease, unspecified, without complications: Secondary | ICD-10-CM | POA: Insufficient documentation

## 2010-10-22 DIAGNOSIS — F3289 Other specified depressive episodes: Secondary | ICD-10-CM | POA: Insufficient documentation

## 2010-10-22 DIAGNOSIS — Z79899 Other long term (current) drug therapy: Secondary | ICD-10-CM | POA: Insufficient documentation

## 2010-10-22 MED ORDER — IOHEXOL 300 MG/ML  SOLN
100.0000 mL | Freq: Once | INTRAMUSCULAR | Status: AC | PRN
  Administered 2010-10-22: 100 mL via INTRAVENOUS

## 2010-10-23 ENCOUNTER — Inpatient Hospital Stay (HOSPITAL_COMMUNITY)
Admission: AD | Admit: 2010-10-23 | Discharge: 2010-10-25 | DRG: 897 | Disposition: A | Discharge status: Home / Self Care | Payer: Self-pay | Attending: Psychiatry | Admitting: Psychiatry

## 2010-10-23 DIAGNOSIS — Z9119 Patient's noncompliance with other medical treatment and regimen: Secondary | ICD-10-CM

## 2010-10-23 DIAGNOSIS — F1910 Other psychoactive substance abuse, uncomplicated: Secondary | ICD-10-CM

## 2010-10-23 DIAGNOSIS — R45851 Suicidal ideations: Secondary | ICD-10-CM

## 2010-10-23 DIAGNOSIS — F1020 Alcohol dependence, uncomplicated: Principal | ICD-10-CM

## 2010-10-23 DIAGNOSIS — Z91199 Patient's noncompliance with other medical treatment and regimen due to unspecified reason: Secondary | ICD-10-CM

## 2010-10-23 DIAGNOSIS — K5090 Crohn's disease, unspecified, without complications: Secondary | ICD-10-CM

## 2010-10-23 DIAGNOSIS — F39 Unspecified mood [affective] disorder: Secondary | ICD-10-CM

## 2010-10-23 DIAGNOSIS — F10231 Alcohol dependence with withdrawal delirium: Secondary | ICD-10-CM

## 2010-10-23 LAB — URINALYSIS, ROUTINE W REFLEX MICROSCOPIC
Leukocytes, UA: NEGATIVE
Nitrite: NEGATIVE
Protein, ur: NEGATIVE mg/dL
Urobilinogen, UA: 0.2 mg/dL (ref 0.0–1.0)

## 2010-10-23 LAB — DIFFERENTIAL
Basophils Absolute: 0 10*3/uL (ref 0.0–0.1)
Eosinophils Absolute: 0.1 10*3/uL (ref 0.0–0.7)
Eosinophils Relative: 1 % (ref 0–5)
Lymphocytes Relative: 24 % (ref 12–46)
Lymphs Abs: 1.9 10*3/uL (ref 0.7–4.0)
Monocytes Absolute: 0.6 10*3/uL (ref 0.1–1.0)

## 2010-10-23 LAB — POCT I-STAT, CHEM 8
Calcium, Ion: 1.12 mmol/L (ref 1.12–1.32)
Creatinine, Ser: 1.4 mg/dL (ref 0.4–1.5)
Glucose, Bld: 103 mg/dL — ABNORMAL HIGH (ref 70–99)
HCT: 45 % (ref 39.0–52.0)
Hemoglobin: 15.3 g/dL (ref 13.0–17.0)

## 2010-10-23 LAB — CBC
Hemoglobin: 14.4 g/dL (ref 13.0–17.0)
MCH: 30.6 pg (ref 26.0–34.0)
RBC: 4.71 MIL/uL (ref 4.22–5.81)

## 2010-10-23 LAB — ACETAMINOPHEN LEVEL: Acetaminophen (Tylenol), Serum: <15 ug/mL (ref 10–30)

## 2010-10-23 LAB — ETHANOL: Alcohol, Ethyl (B): 165 mg/dL — ABNORMAL HIGH (ref 0–10)

## 2010-10-23 LAB — RAPID URINE DRUG SCREEN, HOSP PERFORMED: Opiates: NOT DETECTED

## 2010-10-24 DIAGNOSIS — F3112 Bipolar disorder, current episode manic without psychotic features, moderate: Secondary | ICD-10-CM

## 2010-10-24 LAB — TSH: TSH: 2.681 u[IU]/mL (ref 0.350–4.500)
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
## 2011-02-05 LAB — CBC
HCT: 41.8
Hemoglobin: 14.4
WBC: 6.4

## 2011-02-05 LAB — DIFFERENTIAL
Eosinophils Relative: 2
Lymphocytes Relative: 27
Lymphs Abs: 1.7
Monocytes Absolute: 0.4

## 2011-02-09 LAB — RAPID URINE DRUG SCREEN, HOSP PERFORMED
Amphetamines: NOT DETECTED
Barbiturates: NOT DETECTED
Benzodiazepines: POSITIVE — AB
Cocaine: POSITIVE — AB

## 2011-02-09 LAB — HEPATIC FUNCTION PANEL
Alkaline Phosphatase: 70
Bilirubin, Direct: 0.2
Indirect Bilirubin: 0.6
Total Protein: 7.1

## 2011-02-09 LAB — POCT I-STAT, CHEM 8
BUN: 11
HCT: 49
Sodium: 141
TCO2: 26

## 2011-02-09 LAB — ETHANOL: Alcohol, Ethyl (B): 117 — ABNORMAL HIGH

## 2011-02-20 LAB — DIFFERENTIAL
Basophils Absolute: 0
Basophils Relative: 0
Eosinophils Absolute: 0.1 — ABNORMAL LOW
Eosinophils Relative: 1
Lymphocytes Relative: 8 — ABNORMAL LOW
Lymphs Abs: 0.6 — ABNORMAL LOW
Monocytes Absolute: 0.3
Monocytes Relative: 3
Neutro Abs: 6.8
Neutrophils Relative %: 88 — ABNORMAL HIGH

## 2011-02-20 LAB — RAPID URINE DRUG SCREEN, HOSP PERFORMED
Amphetamines: NOT DETECTED
Barbiturates: NOT DETECTED
Benzodiazepines: NOT DETECTED
Cocaine: POSITIVE — AB
Opiates: NOT DETECTED
Tetrahydrocannabinol: POSITIVE — AB

## 2011-02-20 LAB — CBC
HCT: 47.3
Hemoglobin: 16.7
MCHC: 35.3
MCV: 87.5
Platelets: 247
RBC: 5.41
RDW: 14
WBC: 7.7

## 2011-02-20 LAB — BASIC METABOLIC PANEL
BUN: 18
CO2: 29
Calcium: 9.8
Chloride: 100
Creatinine, Ser: 1.17
GFR calc Af Amer: >60
GFR calc non Af Amer: >60
Glucose, Bld: 142 — ABNORMAL HIGH
Potassium: 4.3
Sodium: 138

## 2011-02-20 LAB — TSH: TSH: 0.559

## 2011-02-20 LAB — HEPATIC FUNCTION PANEL
Indirect Bilirubin: 0.7
Total Protein: 5.9 — ABNORMAL LOW

## 2011-02-20 LAB — ETHANOL: Alcohol, Ethyl (B): <5

## 2011-02-22 LAB — URINALYSIS, ROUTINE W REFLEX MICROSCOPIC
Bilirubin Urine: NEGATIVE
Glucose, UA: NEGATIVE
Hgb urine dipstick: NEGATIVE
Hgb urine dipstick: NEGATIVE
Hgb urine dipstick: NEGATIVE
Ketones, ur: 15 — AB
Protein, ur: NEGATIVE
Protein, ur: NEGATIVE
Specific Gravity, Urine: 1.031 — ABNORMAL HIGH
Urobilinogen, UA: 0.2
Urobilinogen, UA: 1

## 2011-02-22 LAB — CBC
HCT: 45.5
HCT: 46.4
Hemoglobin: 15.9
MCV: 87.7
MCV: 88.4
Platelets: 231
Platelets: 238
RBC: 4.85
RBC: 5.3
RDW: 12.6
WBC: 6
WBC: 8.8

## 2011-02-22 LAB — COMPREHENSIVE METABOLIC PANEL
Albumin: 3.9
Albumin: 4.3
Alkaline Phosphatase: 52
BUN: 10
BUN: 15
CO2: 24
Chloride: 104
Creatinine, Ser: 0.94
Glucose, Bld: 98
Potassium: 3.8
Total Bilirubin: 1.2
Total Bilirubin: 1.5 — ABNORMAL HIGH
Total Protein: 6.2

## 2011-02-22 LAB — DIFFERENTIAL
Basophils Absolute: 0
Basophils Absolute: 0
Basophils Relative: 1
Lymphocytes Relative: 13
Monocytes Absolute: 0.3
Monocytes Absolute: 0.5
Monocytes Relative: 5
Neutro Abs: 7.1
Neutro Abs: 8.8 — ABNORMAL HIGH

## 2011-02-22 LAB — LIPASE, BLOOD: Lipase: 14

## 2011-03-12 ENCOUNTER — Emergency Department (HOSPITAL_COMMUNITY): Payer: Self-pay

## 2011-03-12 ENCOUNTER — Emergency Department (HOSPITAL_COMMUNITY)
Admission: EM | Admit: 2011-03-12 | Discharge: 2011-03-12 | Disposition: A | Discharge status: Home / Self Care | Payer: Self-pay | Attending: Emergency Medicine | Admitting: Emergency Medicine

## 2011-03-12 DIAGNOSIS — F329 Major depressive disorder, single episode, unspecified: Secondary | ICD-10-CM | POA: Insufficient documentation

## 2011-03-12 DIAGNOSIS — L500 Allergic urticaria: Secondary | ICD-10-CM | POA: Insufficient documentation

## 2011-03-12 DIAGNOSIS — J45909 Unspecified asthma, uncomplicated: Secondary | ICD-10-CM | POA: Insufficient documentation

## 2011-03-12 DIAGNOSIS — Z79899 Other long term (current) drug therapy: Secondary | ICD-10-CM | POA: Insufficient documentation

## 2011-03-12 DIAGNOSIS — K5090 Crohn's disease, unspecified, without complications: Secondary | ICD-10-CM | POA: Insufficient documentation

## 2011-03-12 DIAGNOSIS — R000 Tachycardia, unspecified: Secondary | ICD-10-CM | POA: Insufficient documentation

## 2011-03-12 DIAGNOSIS — R197 Diarrhea, unspecified: Secondary | ICD-10-CM | POA: Insufficient documentation

## 2011-03-12 DIAGNOSIS — L299 Pruritus, unspecified: Secondary | ICD-10-CM | POA: Insufficient documentation

## 2011-03-12 DIAGNOSIS — F3289 Other specified depressive episodes: Secondary | ICD-10-CM | POA: Insufficient documentation

## 2011-03-12 DIAGNOSIS — R109 Unspecified abdominal pain: Secondary | ICD-10-CM | POA: Insufficient documentation

## 2011-03-12 LAB — DIFFERENTIAL
Basophils Absolute: 0 10*3/uL (ref 0.0–0.1)
Eosinophils Relative: 0 % (ref 0–5)
Lymphocytes Relative: 14 % (ref 12–46)
Lymphs Abs: 1.7 10*3/uL (ref 0.7–4.0)
Neutro Abs: 9.9 10*3/uL — ABNORMAL HIGH (ref 1.7–7.7)
Neutrophils Relative %: 82 % — ABNORMAL HIGH (ref 43–77)

## 2011-03-12 LAB — COMPREHENSIVE METABOLIC PANEL
Alkaline Phosphatase: 79 U/L (ref 39–117)
BUN: 13 mg/dL (ref 6–23)
CO2: 22 mEq/L (ref 19–32)
Chloride: 103 mEq/L (ref 96–112)
Creatinine, Ser: 1 mg/dL (ref 0.50–1.35)
GFR calc non Af Amer: >90 mL/min (ref >90)
Potassium: 3.5 mEq/L (ref 3.5–5.1)
Total Bilirubin: 0.6 mg/dL (ref 0.3–1.2)

## 2011-03-12 LAB — CBC
HCT: 41.9 % (ref 39.0–52.0)
Hemoglobin: 14.8 g/dL (ref 13.0–17.0)
MCV: 88.4 fL (ref 78.0–100.0)
RBC: 4.74 MIL/uL (ref 4.22–5.81)
WBC: 12.1 10*3/uL — ABNORMAL HIGH (ref 4.0–10.5)

## 2011-12-02 ENCOUNTER — Emergency Department (HOSPITAL_COMMUNITY)
Admission: EM | Admit: 2011-12-02 | Discharge: 2011-12-03 | Disposition: A | Discharge status: Home / Self Care | Payer: Self-pay | Attending: Emergency Medicine | Admitting: Emergency Medicine

## 2011-12-02 DIAGNOSIS — J45909 Unspecified asthma, uncomplicated: Secondary | ICD-10-CM | POA: Insufficient documentation

## 2011-12-02 DIAGNOSIS — R079 Chest pain, unspecified: Secondary | ICD-10-CM | POA: Insufficient documentation

## 2011-12-03 ENCOUNTER — Other Ambulatory Visit (HOSPITAL_COMMUNITY): Payer: Self-pay | Admitting: Emergency Medicine

## 2011-12-03 ENCOUNTER — Ambulatory Visit (HOSPITAL_COMMUNITY)
Admission: RE | Admit: 2011-12-03 | Discharge: 2011-12-03 | Disposition: A | Discharge status: Home / Self Care | Payer: Self-pay | Source: Ambulatory Visit | Attending: Emergency Medicine | Admitting: Emergency Medicine

## 2011-12-03 DIAGNOSIS — R509 Fever, unspecified: Secondary | ICD-10-CM | POA: Insufficient documentation

## 2011-12-03 DIAGNOSIS — R059 Cough, unspecified: Secondary | ICD-10-CM | POA: Insufficient documentation

## 2011-12-03 DIAGNOSIS — R0989 Other specified symptoms and signs involving the circulatory and respiratory systems: Secondary | ICD-10-CM | POA: Insufficient documentation

## 2011-12-03 DIAGNOSIS — R05 Cough: Secondary | ICD-10-CM | POA: Insufficient documentation

## 2011-12-03 DIAGNOSIS — R079 Chest pain, unspecified: Secondary | ICD-10-CM | POA: Insufficient documentation

## 2011-12-03 DIAGNOSIS — R52 Pain, unspecified: Secondary | ICD-10-CM

## 2011-12-03 LAB — COMPREHENSIVE METABOLIC PANEL
ALT: 20 U/L (ref 0–53)
AST: 24 U/L (ref 0–37)
Albumin: 3.2 g/dL — ABNORMAL LOW (ref 3.5–5.2)
Alkaline Phosphatase: 58 U/L (ref 39–117)
Chloride: 103 mEq/L (ref 96–112)
Potassium: 4 mEq/L (ref 3.5–5.1)
Sodium: 136 mEq/L (ref 135–145)
Total Bilirubin: 0.3 mg/dL (ref 0.3–1.2)
Total Protein: 6.4 g/dL (ref 6.0–8.3)

## 2011-12-03 MED FILL — Morphine Sulfate Inj 4 MG/ML: INTRAMUSCULAR | Qty: 1 | Status: AC

## 2011-12-03 NOTE — ED Notes (Signed)
See downtime charting. 

## 2011-12-04 LAB — POCT I-STAT, CHEM 8
Creatinine, Ser: 1.1 mg/dL (ref 0.50–1.35)
Glucose, Bld: 102 mg/dL — ABNORMAL HIGH (ref 70–99)
HCT: 38 % — ABNORMAL LOW (ref 39.0–52.0)
Hemoglobin: 12.9 g/dL — ABNORMAL LOW (ref 13.0–17.0)
Potassium: 4 mEq/L (ref 3.5–5.1)
Sodium: 138 mEq/L (ref 135–145)
TCO2: 21 mmol/L (ref 0–100)

## 2013-12-09 IMAGING — CR DG CHEST 2V
2 series · 2 of 2 positions shown · non-contrast
Comparison: None

CLINICAL DATA: Right-sided chest pain, cough, congestion and
fever.

CHEST - 2 VIEW

[w chest pa]
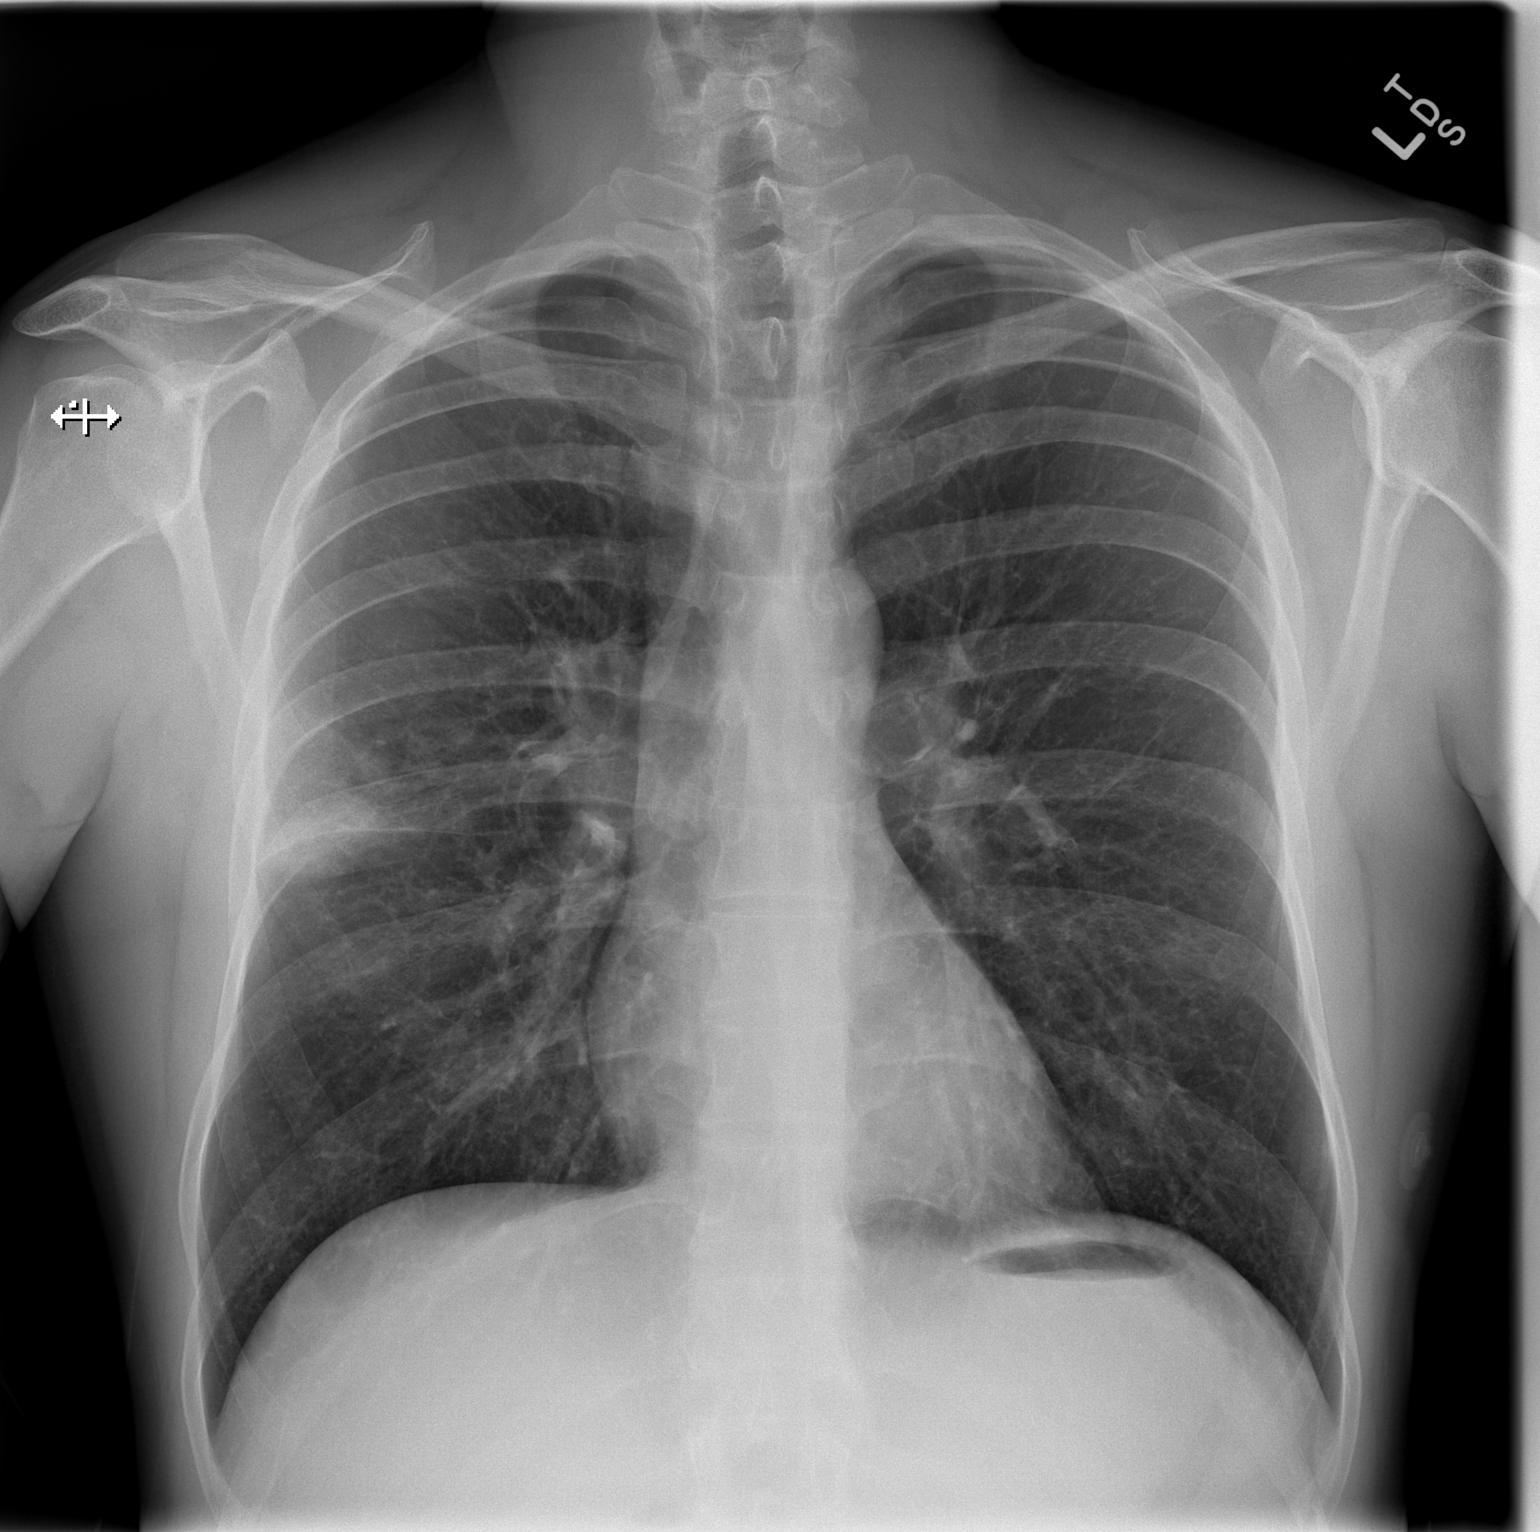

[w chest lat]
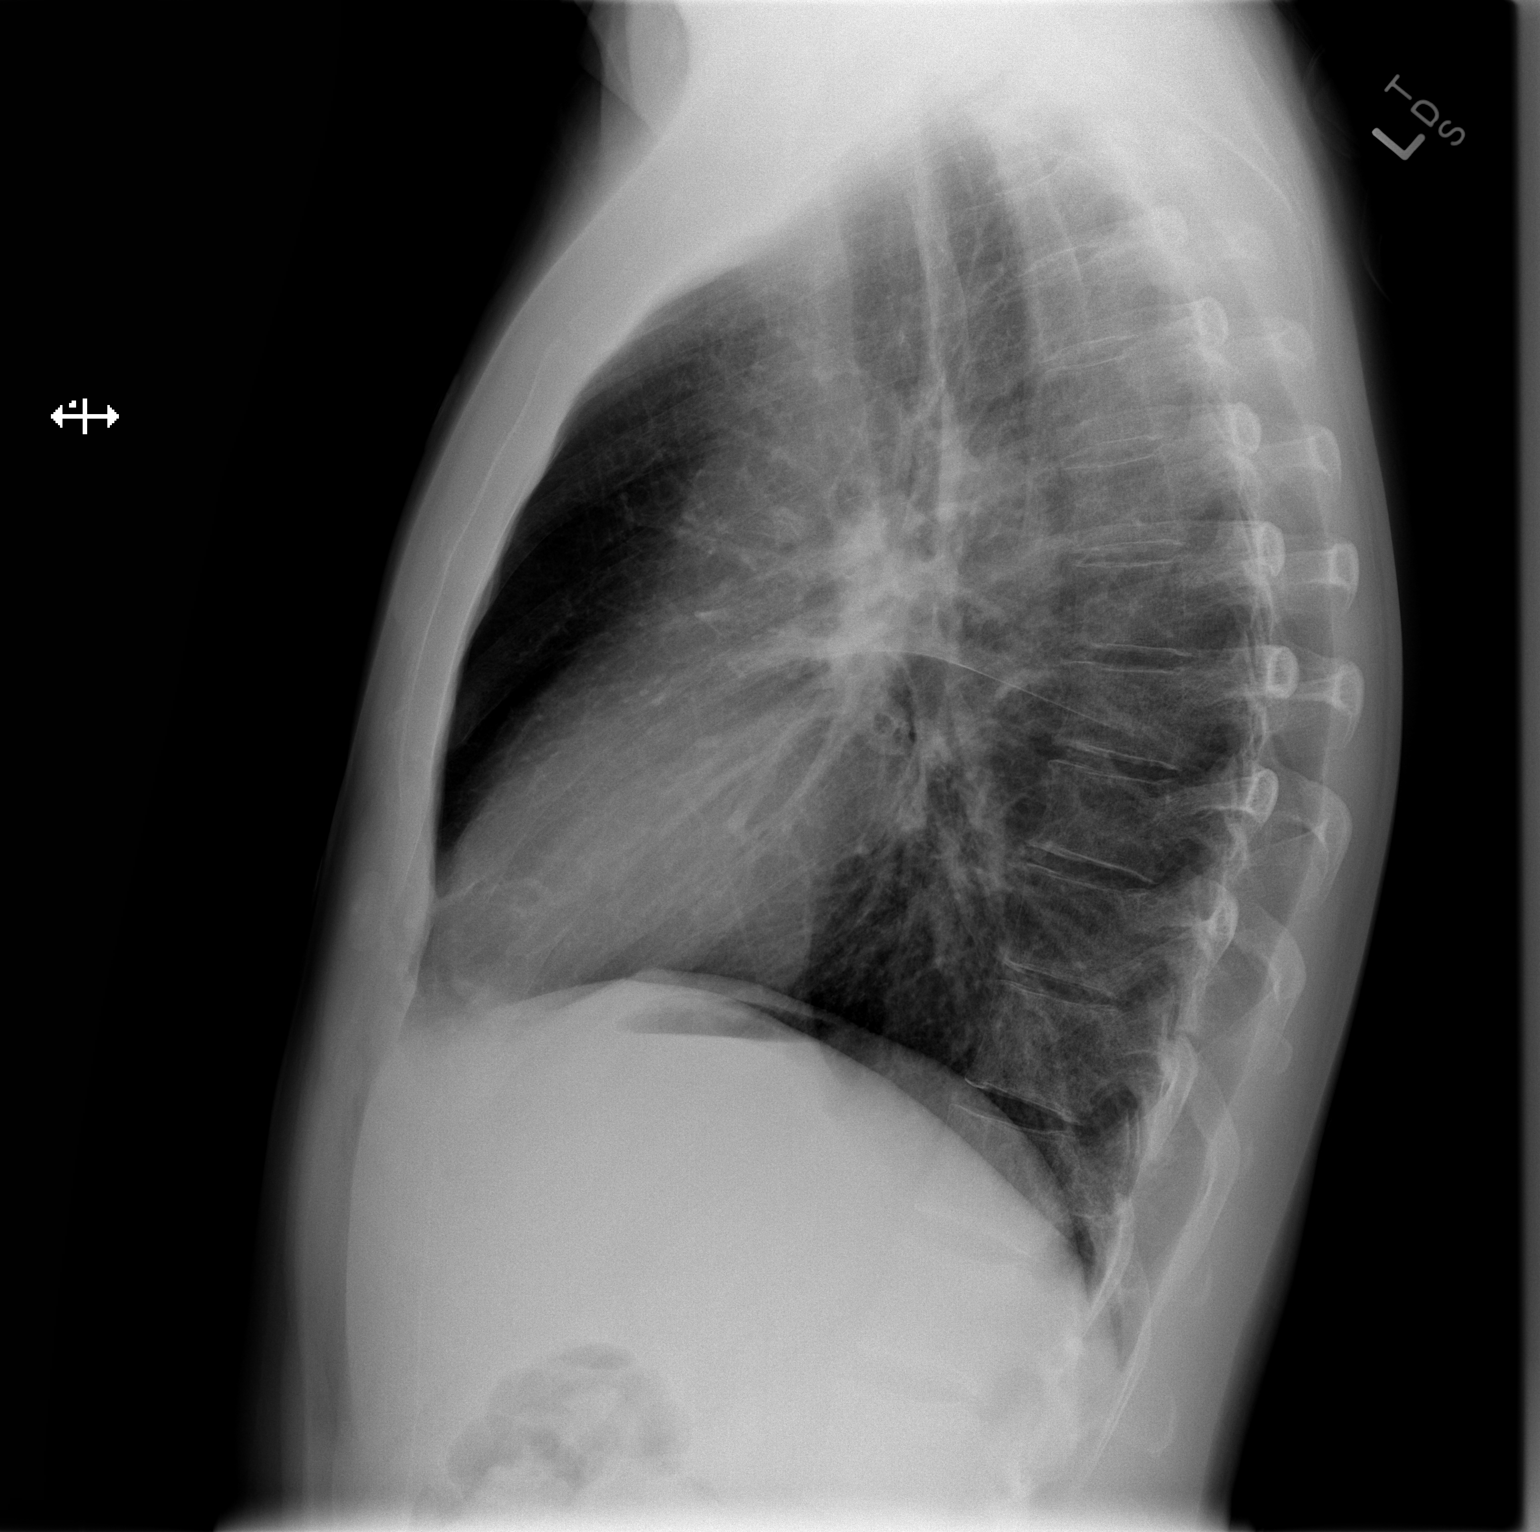

[2 of 2 positions shown; findings below may reference images not displayed]

FINDINGS: Focal density in the mid lateral right lung likely
localizes to the upper lobe.  This is suspicious for acute
infiltrate.  No edema or pleural fluid identified.  Heart size is
normal.  The bony thorax is unremarkable.
IMPRESSION: Right lung infiltrate.

## 2015-02-17 ENCOUNTER — Ambulatory Visit: Payer: Self-pay | Admitting: Physician Assistant

## 2015-02-17 ENCOUNTER — Encounter: Payer: Self-pay | Admitting: Physician Assistant

## 2015-02-17 VITALS — BP 106/70 | HR 64 | Temp 97.5°F | Ht 70.5 in | Wt 167.8 lb

## 2015-02-17 DIAGNOSIS — F1011 Alcohol abuse, in remission: Secondary | ICD-10-CM

## 2015-02-17 DIAGNOSIS — K5090 Crohn's disease, unspecified, without complications: Secondary | ICD-10-CM

## 2015-02-17 DIAGNOSIS — F17210 Nicotine dependence, cigarettes, uncomplicated: Secondary | ICD-10-CM

## 2015-02-17 NOTE — Patient Instructions (Signed)
Smoking Cessation, Tips for Success If you are ready to quit smoking, congratulations! You have chosen to help yourself be healthier. Cigarettes bring nicotine, tar, carbon monoxide, and other irritants into your body. Your lungs, heart, and blood vessels will be able to work better without these poisons. There are many different ways to quit smoking. Nicotine gum, nicotine patches, a nicotine inhaler, or nicotine nasal spray can help with physical craving. Hypnosis, support groups, and medicines help break the habit of smoking. WHAT THINGS CAN I DO TO MAKE QUITTING EASIER?  Here are some tips to help you quit for good:  Pick a date when you will quit smoking completely. Tell all of your friends and family about your plan to quit on that date.  Do not try to slowly cut down on the number of cigarettes you are smoking. Pick a quit date and quit smoking completely starting on that day.  Throw away all cigarettes.   Clean and remove all ashtrays from your home, work, and car.  On a card, write down your reasons for quitting. Carry the card with you and read it when you get the urge to smoke.  Cleanse your body of nicotine. Drink enough water and fluids to keep your urine clear or pale yellow. Do this after quitting to flush the nicotine from your body.  Learn to predict your moods. Do not let a bad situation be your excuse to have a cigarette. Some situations in your life might tempt you into wanting a cigarette.  Never have "just one" cigarette. It leads to wanting another and another. Remind yourself of your decision to quit.  Change habits associated with smoking. If you smoked while driving or when feeling stressed, try other activities to replace smoking. Stand up when drinking your coffee. Brush your teeth after eating. Sit in a different chair when you read the paper. Avoid alcohol while trying to quit, and try to drink fewer caffeinated beverages. Alcohol and caffeine may urge you to  smoke.  Avoid foods and drinks that can trigger a desire to smoke, such as sugary or spicy foods and alcohol.  Ask people who smoke not to smoke around you.  Have something planned to do right after eating or having a cup of coffee. For example, plan to take a walk or exercise.  Try a relaxation exercise to calm you down and decrease your stress. Remember, you may be tense and nervous for the first 2 weeks after you quit, but this will pass.  Find new activities to keep your hands busy. Play with a pen, coin, or rubber band. Doodle or draw things on paper.  Brush your teeth right after eating. This will help cut down on the craving for the taste of tobacco after meals. You can also try mouthwash.   Use oral substitutes in place of cigarettes. Try using lemon drops, carrots, cinnamon sticks, or chewing gum. Keep them handy so they are available when you have the urge to smoke.  When you have the urge to smoke, try deep breathing.  Designate your home as a nonsmoking area.  If you are a heavy smoker, ask your health care provider about a prescription for nicotine chewing gum. It can ease your withdrawal from nicotine.  Reward yourself. Set aside the cigarette money you save and buy yourself something nice.  Look for support from others. Join a support group or smoking cessation program. Ask someone at home or at work to help you with your plan   to quit smoking.  Always ask yourself, "Do I need this cigarette or is this just a reflex?" Tell yourself, "Today, I choose not to smoke," or "I do not want to smoke." You are reminding yourself of your decision to quit.  Do not replace cigarette smoking with electronic cigarettes (commonly called e-cigarettes). The safety of e-cigarettes is unknown, and some may contain harmful chemicals.  If you relapse, do not give up! Plan ahead and think about what you will do the next time you get the urge to smoke. HOW WILL I FEEL WHEN I QUIT SMOKING? You  may have symptoms of withdrawal because your body is used to nicotine (the addictive substance in cigarettes). You may crave cigarettes, be irritable, feel very hungry, cough often, get headaches, or have difficulty concentrating. The withdrawal symptoms are only temporary. They are strongest when you first quit but will go away within 10-14 days. When withdrawal symptoms occur, stay in control. Think about your reasons for quitting. Remind yourself that these are signs that your body is healing and getting used to being without cigarettes. Remember that withdrawal symptoms are easier to treat than the major diseases that smoking can cause.  Even after the withdrawal is over, expect periodic urges to smoke. However, these cravings are generally short lived and will go away whether you smoke or not. Do not smoke! WHAT RESOURCES ARE AVAILABLE TO HELP ME QUIT SMOKING? Your health care provider can direct you to community resources or hospitals for support, which may include:  Group support.  Education.  Hypnosis.  Therapy.   This information is not intended to replace advice given to you by your health care provider. Make sure you discuss any questions you have with your health care provider.   Document Released: 01/27/2004 Document Revised: 05/21/2014 Document Reviewed: 10/16/2012 Elsevier Interactive Patient Education 2016 [employer]  

## 2015-02-17 NOTE — Progress Notes (Signed)
   BP 106/70 mmHg  Pulse 64  Temp(Src) 97.5 F (36.4 C)  Ht 5' 10.5" (1.791 m)  Wt 167 lb 12.8 oz (76.114 kg)  BMI 23.73 kg/m2  SpO2 98%   Subjective:    Patient ID: Brendan Ochoa, male    DOB: [birth year], 39 y.o.   MRN: [id number]  HPI: Brendan Ochoa is a 39 y.o. male presenting on 02/17/2015 for Follow-up  Chief Complaint  Patient presents with  . Follow-up    pt states he is taking probiotic and watching what he eats, pt states he is doing good. pt states he feels well, and it has been a good month.      HPI   Received records from dr Jeani Hawking, pt's GI dr that he saw last in 2008  Reviewed labs with pt- all normal  Relevant past medical, surgical, family and social history reviewed and updated as indicated. Interim medical history since our last visit reviewed. Allergies and medications reviewed and updated.   Current outpatient prescriptions:  Marland Kitchen  Multiple Vitamin (MULTIVITAMIN) tablet, Take 1 tablet by mouth daily., Disp: , Rfl:  .  Probiotic Product (PROBIOTIC PO), Take by mouth daily., Disp: , Rfl:    Review of Systems  Per HPI unless specifically indicated above     Objective:    BP 106/70 mmHg  Pulse 64  Temp(Src) 97.5 F (36.4 C)  Ht 5' 10.5" (1.791 m)  Wt 167 lb 12.8 oz (76.114 kg)  BMI 23.73 kg/m2  SpO2 98%  Wt Readings from Last 3 Encounters:  02/17/15 167 lb 12.8 oz (76.114 kg)  08/01/10 173 lb 6.1 oz (78.645 kg)  06/15/09 182 lb 12.8 oz (82.918 kg)    Physical Exam  Constitutional: He is oriented to person, place, and time. He appears well-developed and well-nourished.  HENT:  Head: Normocephalic and atraumatic.  Neck: Neck supple.  Cardiovascular: Normal rate and regular rhythm.   Pulmonary/Chest: Effort normal and breath sounds normal. He has no wheezes.  Abdominal: Soft. Bowel sounds are normal. There is no tenderness.  Musculoskeletal: He exhibits no edema.  Lymphadenopathy:    He has no cervical adenopathy.   Neurological: He is alert and oriented to person, place, and time.  Skin: Skin is warm and dry.  Psychiatric: He has a normal mood and affect. His behavior is normal.  Vitals reviewed.       Assessment & Plan:   Crohn's- cont probiotic Smoking- counseled on  Cessation etoh- in remission- living at [facility] house    Follow up plan: 1 yr. rto sooner prn

## 2015-12-12 ENCOUNTER — Emergency Department (HOSPITAL_COMMUNITY): Payer: Self-pay

## 2015-12-12 ENCOUNTER — Emergency Department (HOSPITAL_COMMUNITY)
Admission: EM | Admit: 2015-12-12 | Discharge: 2015-12-12 | Disposition: A | Discharge status: Home / Self Care | Payer: Self-pay | Attending: Emergency Medicine | Admitting: Emergency Medicine

## 2015-12-12 ENCOUNTER — Encounter (HOSPITAL_COMMUNITY): Payer: Self-pay | Admitting: Emergency Medicine

## 2015-12-12 DIAGNOSIS — F17210 Nicotine dependence, cigarettes, uncomplicated: Secondary | ICD-10-CM | POA: Insufficient documentation

## 2015-12-12 DIAGNOSIS — Z79899 Other long term (current) drug therapy: Secondary | ICD-10-CM | POA: Insufficient documentation

## 2015-12-12 DIAGNOSIS — F32A Depression, unspecified: Secondary | ICD-10-CM

## 2015-12-12 DIAGNOSIS — J45909 Unspecified asthma, uncomplicated: Secondary | ICD-10-CM | POA: Insufficient documentation

## 2015-12-12 DIAGNOSIS — F1910 Other psychoactive substance abuse, uncomplicated: Secondary | ICD-10-CM | POA: Insufficient documentation

## 2015-12-12 DIAGNOSIS — F329 Major depressive disorder, single episode, unspecified: Secondary | ICD-10-CM | POA: Insufficient documentation

## 2015-12-12 DIAGNOSIS — F1410 Cocaine abuse, uncomplicated: Secondary | ICD-10-CM | POA: Insufficient documentation

## 2015-12-12 HISTORY — DX: Cocaine abuse, uncomplicated: F14.10

## 2015-12-12 HISTORY — DX: Unspecified asthma, uncomplicated: J45.909

## 2015-12-12 HISTORY — DX: Depression, unspecified: F32.A

## 2015-12-12 HISTORY — DX: Major depressive disorder, single episode, unspecified: F32.9

## 2015-12-12 HISTORY — DX: Crohn's disease, unspecified, without complications: K50.90

## 2015-12-12 HISTORY — DX: Alcohol abuse, uncomplicated: F10.10

## 2015-12-12 LAB — BASIC METABOLIC PANEL
ANION GAP: 10 (ref 5–15)
BUN: 18 mg/dL (ref 6–20)
CALCIUM: 9.1 mg/dL (ref 8.9–10.3)
CO2: 23 mmol/L (ref 22–32)
Chloride: 100 mmol/L — ABNORMAL LOW (ref 101–111)
Creatinine, Ser: 1.1 mg/dL (ref 0.61–1.24)
GLUCOSE: 93 mg/dL (ref 65–99)
POTASSIUM: 3.9 mmol/L (ref 3.5–5.1)
SODIUM: 133 mmol/L — AB (ref 135–145)

## 2015-12-12 LAB — CBC
HEMATOCRIT: 45.3 % (ref 39.0–52.0)
HEMOGLOBIN: 15.6 g/dL (ref 13.0–17.0)
MCH: 29.8 pg (ref 26.0–34.0)
MCHC: 34.4 g/dL (ref 30.0–36.0)
MCV: 86.6 fL (ref 78.0–100.0)
Platelets: 282 10*3/uL (ref 150–400)
RBC: 5.23 MIL/uL (ref 4.22–5.81)
RDW: 13.7 % (ref 11.5–15.5)
WBC: 8.1 10*3/uL (ref 4.0–10.5)

## 2015-12-12 LAB — I-STAT TROPONIN, ED: TROPONIN I, POC: 0.01 ng/mL (ref 0.00–0.08)

## 2015-12-12 MED ORDER — CHLORDIAZEPOXIDE HCL 25 MG PO CAPS: ORAL_CAPSULE | ORAL | 0 refills | Status: DC

## 2015-12-12 NOTE — ED Triage Notes (Signed)
Pt complaint of increase cocaine and alcohol use associated with depression. Pt denies SI/HI. Pt continues to report shortness of breath and chest pain for months "because of smoking."

## 2015-12-12 NOTE — ED Notes (Signed)
MD at bedside. 

## 2015-12-12 NOTE — ED Provider Notes (Signed)
[facility] DEPT Provider Note   CSN: [id number] Arrival date & time: 12/12/15  2947  First Provider Contact:  First MD Initiated Contact with Patient 12/12/15 [phone]        History   Chief Complaint Chief Complaint  Patient presents with  . Depression  . Chest Pain    HPI Brendan Ochoa is a 40 y.o. male.  Patient with long-standing history of recurrent drug and alcohol abuse.  He presents to the emergency department requesting detox from both cocaine and alcohol.  He last used both last night.  He does report depression without suicidal thoughts or intentions.  He states that he continues to drink alcohol and wishes to straighten out his life.  Is also had some intermittent sharp recurrent chest pain over the past several days without associated shortness of breath or nausea or vomiting.  No family history of early cardiac disease.   The history is provided by the patient.  Depression  Associated symptoms include chest pain.  Chest Pain  Associated symptoms: chest pain     Past Medical History:  Diagnosis Date  . Asthma   . Cocaine abuse   . Crohn disease ([place])   . Depression   . ETOH abuse     Patient Active Problem List   Diagnosis Date Noted  . HYPERGLYCEMIA 11/01/2009  . ABNORMAL WEIGHT GAIN 06/15/2009  . TOBACCO ABUSE 07/18/2007  . SUBSTANCE ABUSE, MULTIPLE 07/18/2007  . DEPRESSION 07/18/2007  . ALLERGIC RHINITIS 07/18/2007  . ASTHMA 07/18/2007  . GERD 07/18/2007  . CROHN'S DISEASE 07/18/2007    History reviewed. No pertinent surgical history.     Home Medications    Prior to Admission medications   Medication Sig Start Date End Date Taking? Authorizing Provider  diphenhydrAMINE (BENADRYL) 25 mg capsule Take 50 mg by mouth every 8 (eight) hours as needed for allergies.   Yes Historical Provider, MD  Loratadine-Pseudoephedrine (CLARITIN-D 12 HOUR PO) Take 1 tablet by mouth every 12 (twelve) hours as needed (allergies).   Yes Historical  Provider, MD    Family History No family history on file.  Social History Social History  Substance Use Topics  . Smoking status: Current Every Day Smoker    Packs/day: 1.00    Years: 15.00    Types: Cigarettes  . Smokeless tobacco: Never Used  . Alcohol use Yes     Comment: 12 beers a day     Allergies   Review of patient's allergies indicates no known allergies.   Review of Systems Review of Systems  Cardiovascular: Positive for chest pain.  Psychiatric/Behavioral: Positive for depression.  All other systems reviewed and are negative.    Physical Exam Updated Vital Signs BP 97/72 (BP Location: Left Arm)   Pulse 87   Temp 98.4 F (36.9 C) (Oral)   Resp 18   Ht 5' 11"  (1.803 m)   Wt 165 lb (74.8 kg)   SpO2 96%   BMI 23.01 kg/m   Physical Exam  Constitutional: He is oriented to person, place, and time. He appears well-developed and well-nourished.  HENT:  Head: Normocephalic and atraumatic.  Eyes: EOM are normal.  Neck: Normal range of motion.  Cardiovascular: Normal rate, regular rhythm, normal heart sounds and intact distal pulses.   Pulmonary/Chest: Effort normal and breath sounds normal. No respiratory distress.  Abdominal: Soft. He exhibits no distension. There is no tenderness.  Musculoskeletal: Normal range of motion.  Neurological: He is alert and oriented to person, place, and time.  Skin: Skin is warm and dry.  Psychiatric: He has a normal mood and affect. Judgment normal.  Nursing note and vitals reviewed.    ED Treatments / Results  Labs (all labs ordered are listed, but only abnormal results are displayed) Labs Reviewed  [place], ED    EKG  EKG Interpretation  Date/Time:  Monday December 12 2015 08:47:25 EDT Ventricular Rate:  87 PR Interval:    QRS Duration: 98 QT Interval:  375 QTC Calculation: 452 R Axis:   78 Text Interpretation:  Sinus rhythm Left ventricular hypertrophy Nonspecific T  abnormalities, lateral leads ST elev, probable normal early repol pattern Baseline wander in lead(s) V2 V5 No significant change was found Confirmed by Melat Wrisley  MD, Lennette Bihari ([id number]) on 12/12/2015 9:06:53 AM       Radiology Dg Chest 2 View  Result Date: 12/12/2015 CLINICAL DATA:  Cocaine and alcohol abuse.  Shortness of breath. EXAM: CHEST  2 VIEW COMPARISON:  December 02, 2011 FINDINGS: No pneumothorax. The heart, hila, mediastinum, lungs, and pleura are normal. IMPRESSION: No active cardiopulmonary disease. Electronically Signed   By: Dorise Bullion III M.D   On: 12/12/2015 08:54   Procedures Procedures (including critical care time)  Medications Ordered in ED Medications - No data to display   Initial Impression / Assessment and Plan / ED Course  I have reviewed the triage vital signs and the nursing notes.  Pertinent labs & imaging results that were available during my care of the patient were reviewed by me and considered in my medical decision making (see chart for details).  Clinical Course    Doubt ACS.  Doubt PE.  EKG without ischemic changes.  Troponin negative.  Chest x-ray clear.  Outpatient resources given to the patient for both alcohol and cocaine.  Patient encouraged to continue with both NA and AA.  Patient is given an outpatient prescription for Librium taper to help with alcohol addiction.  No signs of withdrawal at this time  Final Clinical Impressions(s) / ED Diagnoses   Final diagnoses:  Depression  Substance abuse    New Prescriptions New Prescriptions   CHLORDIAZEPOXIDE (LIBRIUM) 25 MG CAPSULE    37m PO TID x 1D, then 25-572mPO BID X 1D, then 25-5017mO QD X 1D     KevJola SchmidtD 12/12/15 [id number]

## 2016-02-16 ENCOUNTER — Ambulatory Visit: Payer: Self-pay | Admitting: Physician Assistant

## 2016-02-22 ENCOUNTER — Encounter: Payer: Self-pay | Admitting: Physician Assistant

## 2016-03-08 ENCOUNTER — Ambulatory Visit: Payer: Self-pay | Admitting: Physician Assistant

## 2016-03-08 ENCOUNTER — Encounter: Payer: Self-pay | Admitting: Physician Assistant

## 2016-03-08 VITALS — BP 96/64 | HR 87 | Temp 97.9°F | Ht 70.5 in | Wt 169.5 lb

## 2016-03-08 DIAGNOSIS — F17210 Nicotine dependence, cigarettes, uncomplicated: Secondary | ICD-10-CM

## 2016-03-08 DIAGNOSIS — K5090 Crohn's disease, unspecified, without complications: Secondary | ICD-10-CM

## 2016-03-08 DIAGNOSIS — F1011 Alcohol abuse, in remission: Secondary | ICD-10-CM

## 2016-03-08 NOTE — Progress Notes (Signed)
BP 96/64 (BP Location: Left Arm, Patient Position: Sitting, Cuff Size: Normal)   Pulse 87   Temp 97.9 F (36.6 C)   Ht 5' 10.5" (1.791 m)   Wt 169 lb 8 oz (76.9 kg)   SpO2 98%   BMI 23.98 kg/m    Subjective:    Patient ID: Brendan Ochoa, male    DOB: 09/30/1975, 40 y.o.   MRN: [id number]  HPI: Brendan Ochoa is a 40 y.o. male presenting on 03/08/2016 for Follow-up   HPI   Doing well.  Says his crohn's is okay as long as he watches what he eats.  He did have an episode about 6 months ago.    He is still smoking but he is still clean (hx alcohol and cocaine). He had a relapse in July this year but says that was just for one day  Relevant past medical, surgical, family and social history reviewed and updated as indicated. Interim medical history since our last visit reviewed. Allergies and medications reviewed and updated.   Current Outpatient Prescriptions:  .  Probiotic Product (PROBIOTIC PO), Take 1 tablet by mouth daily., Disp: , Rfl:    Current Outpatient Prescriptions:  .  Probiotic Product (PROBIOTIC PO), Take 1 tablet by mouth daily., Disp: , Rfl:   Review of Systems  Constitutional: Negative for appetite change, chills, diaphoresis, fatigue, fever and unexpected weight change.  HENT: Negative for congestion, dental problem, drooling, ear pain, facial swelling, hearing loss, mouth sores, sneezing, sore throat, trouble swallowing and voice change.   Eyes: Negative for pain, discharge, redness, itching and visual disturbance.  Respiratory: Negative for cough, choking, shortness of breath and wheezing.   Cardiovascular: Negative for chest pain, palpitations and leg swelling.  Gastrointestinal: Negative for abdominal pain, blood in stool, constipation, diarrhea and vomiting.  Endocrine: Negative for cold intolerance, heat intolerance and polydipsia.  Genitourinary: Negative for decreased urine volume, dysuria and hematuria.  Musculoskeletal: Negative for  arthralgias, back pain and gait problem.  Skin: Negative for rash.  Allergic/Immunologic: Negative for environmental allergies.  Neurological: Negative for seizures, syncope, light-headedness and headaches.  Hematological: Negative for adenopathy.  Psychiatric/Behavioral: Negative for agitation, dysphoric mood and suicidal ideas. The patient is not nervous/anxious.     Per HPI unless specifically indicated above     Objective:    BP 96/64 (BP Location: Left Arm, Patient Position: Sitting, Cuff Size: Normal)   Pulse 87   Temp 97.9 F (36.6 C)   Ht 5' 10.5" (1.791 m)   Wt 169 lb 8 oz (76.9 kg)   SpO2 98%   BMI 23.98 kg/m   Wt Readings from Last 3 Encounters:  03/08/16 169 lb 8 oz (76.9 kg)  12/12/15 165 lb (74.8 kg)  02/17/15 167 lb 12.8 oz (76.1 kg)    Physical Exam  Constitutional: He is oriented to person, place, and time. He appears well-developed and well-nourished.  HENT:  Head: Normocephalic and atraumatic.  Neck: Neck supple.  Cardiovascular: Normal rate and regular rhythm.   Pulmonary/Chest: Effort normal and breath sounds normal. He has no wheezes.  Abdominal: Soft. Bowel sounds are normal. There is no hepatosplenomegaly. There is no tenderness.  Musculoskeletal: He exhibits no edema.  Lymphadenopathy:    He has no cervical adenopathy.  Neurological: He is alert and oriented to person, place, and time.  Skin: Skin is warm and dry.  Psychiatric: He has a normal mood and affect. His behavior is normal.  Vitals reviewed.  Assessment & Plan:    Encounter Diagnoses  Name Primary?  . Crohn's disease without complication, unspecified gastrointestinal tract location (HCC) Yes  . Cigarette nicotine dependence, uncomplicated   . Alcohol abuse, in remission     -discussed GI consult but pt says he feels like he doesn't need to go  -counseled on smoking cessation -follow up one year.  RTO sooner prn

## 2016-09-02 ENCOUNTER — Emergency Department (HOSPITAL_COMMUNITY)
Admission: EM | Admit: 2016-09-02 | Discharge: 2016-09-02 | Disposition: A | Discharge status: Home / Self Care | Payer: Self-pay | Attending: Emergency Medicine | Admitting: Emergency Medicine

## 2016-09-02 ENCOUNTER — Encounter (HOSPITAL_COMMUNITY): Payer: Self-pay | Admitting: Emergency Medicine

## 2016-09-02 DIAGNOSIS — F17210 Nicotine dependence, cigarettes, uncomplicated: Secondary | ICD-10-CM | POA: Insufficient documentation

## 2016-09-02 DIAGNOSIS — F1910 Other psychoactive substance abuse, uncomplicated: Secondary | ICD-10-CM | POA: Insufficient documentation

## 2016-09-02 DIAGNOSIS — F329 Major depressive disorder, single episode, unspecified: Secondary | ICD-10-CM | POA: Insufficient documentation

## 2016-09-02 DIAGNOSIS — J45909 Unspecified asthma, uncomplicated: Secondary | ICD-10-CM | POA: Insufficient documentation

## 2016-09-02 DIAGNOSIS — F32A Depression, unspecified: Secondary | ICD-10-CM

## 2016-09-02 MED ORDER — CHLORDIAZEPOXIDE HCL 25 MG PO CAPS: 50.0000 mg | ORAL_CAPSULE | Freq: Three times a day (TID) | ORAL | 0 refills | Status: DC | PRN

## 2016-09-02 NOTE — ED Provider Notes (Signed)
[facility]-EMERGENCY DEPT Provider Note   CSN: [id number] Arrival date & time: 09/02/16  1344     History   Chief Complaint Chief Complaint  Patient presents with  . Depression    HPI Brendan Ochoa is a 41 y.o. male.  The history is provided by the patient and medical records.  Depression     41 year old male with history of asthma, history of depression and polysubstance abuse, presenting to the ED for worsening depression. He reports he has battled with depression for several years.  States over the past 2 months it seems to have gotten worse. Reports he has had some "circumstances" in his life changed which may be contributing but he denies any significant dramatic event. States he was seen at family services about one month ago and started on Zoloft as well as naltrexone but has not noticed any change in his symptoms. States he continues drinking-- states he drinks a few days a week, maybe 8-10 beers at a time.  Last beer was last night.  He has used cocaine as well, last use was Friday night (2 days ago).  States he has not had any withdrawal symptoms such as tremors, seizure activity, nausea, vomiting, agitation, or confusion.  He denies any suicidal or homicidal ideation. No hallucinations. States he is wanting to get back in with a new psychiatrist. He reports he used to see Dr. Dub Mikes which he felt was helpful but has not had any recent follow-up.   Denies any physical symptoms such as chest pain, SOB, abdominal pain, nausea, vomiting, diarrhea.  Past Medical History:  Diagnosis Date  . Asthma   . Cocaine abuse   . Crohn disease (HCC)   . Depression   . ETOH abuse     Patient Active Problem List   Diagnosis Date Noted  . HYPERGLYCEMIA 11/01/2009  . ABNORMAL WEIGHT GAIN 06/15/2009  . TOBACCO ABUSE 07/18/2007  . SUBSTANCE ABUSE, MULTIPLE 07/18/2007  . DEPRESSION 07/18/2007  . ALLERGIC RHINITIS 07/18/2007  . ASTHMA 07/18/2007  . GERD 07/18/2007  . CROHN'S DISEASE  07/18/2007    Past Surgical History:  Procedure Laterality Date  . FINGER AMPUTATION Right 2012   cut finger table saw and surgically amputated       Home Medications    Prior to Admission medications   Medication Sig Start Date End Date Taking? Authorizing Provider  Probiotic Product (PROBIOTIC PO) Take 1 tablet by mouth daily.    Historical Provider, MD    Family History No family history on file.  Social History Social History  Substance Use Topics  . Smoking status: Current Every Day Smoker    Packs/day: 0.25    Years: 15.00    Types: Cigarettes  . Smokeless tobacco: Never Used  . Alcohol use Yes     Allergies   Patient has no known allergies.   Review of Systems Review of Systems  Psychiatric/Behavioral: Positive for depression.       Depression  All other systems reviewed and are negative.    Physical Exam Updated Vital Signs BP 119/67 (BP Location: Left Arm)   Pulse 90   Temp 98 F (36.7 C) (Oral)   Resp 20   Ht 5\' 11"  (1.803 m)   Wt 76.7 kg   SpO2 98%   BMI 23.58 kg/m   Physical Exam  Constitutional: He is oriented to person, place, and time. He appears well-developed and well-nourished.  HENT:  Head: Normocephalic and atraumatic.  Mouth/Throat: Oropharynx is clear and  moist.  Eyes: Conjunctivae and EOM are normal. Pupils are equal, round, and reactive to light.  Neck: Normal range of motion.  Cardiovascular: Normal rate, regular rhythm and normal heart sounds.   Pulmonary/Chest: Effort normal and breath sounds normal. No respiratory distress. He has no wheezes.  Abdominal: Soft. Bowel sounds are normal. There is no tenderness. There is no rebound.  Musculoskeletal: Normal range of motion.  Neurological: He is alert and oriented to person, place, and time. He displays no tremor.  AAOx3, movinig extremities well, no observed tremors or seizure activity, ambulatory with steady gait  Skin: Skin is warm and dry.  Psychiatric: He has a  normal mood and affect. He is not actively hallucinating. He expresses no homicidal and no suicidal ideation. He expresses no suicidal plans and no homicidal plans.  Normal mood, affect is somewhat flat, denies SI/HI/AVH  Nursing note and vitals reviewed.    ED Treatments / Results  Labs (all labs ordered are listed, but only abnormal results are displayed) Labs Reviewed - No data to display  EKG  EKG Interpretation None       Radiology No results found.  Procedures Procedures (including critical care time)  Medications Ordered in ED Medications - No data to display   Initial Impression / Assessment and Plan / ED Course  I have reviewed the triage vital signs and the nursing notes.  Pertinent labs & imaging results that were available during my care of the patient were reviewed by me and considered in my medical decision making (see chart for details).  41 year old male here with worsening depression. He history of same for several years. Reports alcohol and cocaine use recently due to worsening depression. He denies any chest pain, shortness of breath, abdominal pain, tremors, or seizure activity. He is awake, alert, appropriately oriented here. No clinical signs of withdrawal at this time. He denies any suicidal or homicidal ideation. No hallucinations. Does not appear to be a danger to himself or others at this time. Feel he is stable for outpatient management. Will start on Librium taper. Encouraged close follow-up, given resource guide to help with this. He was given strict return precautions for any new or worsening symptoms.    Final Clinical Impressions(s) / ED Diagnoses   Final diagnoses:  Depression, unspecified depression type  Polysubstance abuse    New Prescriptions Discharge Medication List as of 09/02/2016  5:41 PM    START taking these medications   Details  chlordiazePOXIDE (LIBRIUM) 25 MG capsule Take 2 capsules (50 mg total) by mouth 3 (three) times  daily as needed for anxiety. 50mg  by mouth q 6 h day 1 50mg  by mouth q 8 h day 2 50mg  by mouth q 12 h day 3 50mg  by mough QHS day 4, Starting Sun 09/02/2016, Print         Garlon Hatchet, PA-C 09/02/16 2033    Rolland Porter, MD 09/15/16 2316

## 2016-09-02 NOTE — ED Triage Notes (Signed)
patient states that he been dealing with depression for years.  Patient states that the past month, his depression has gotten so bad that he has started drinking ETOH again. Patient denies any SI or HI at this time.  Patient states that, "I just need some help".  Patient states that meds he is on now dont seem to be working.

## 2016-09-02 NOTE — Discharge Instructions (Signed)
Follow-up with one of the clinics.  If you prefer to go back to behavioral health that is fine. Can take the librium if you begin to have any shaking episodes or feel like you are withdrawing. Return to the ED for new or worsening symptoms.

## 2016-11-20 DIAGNOSIS — R1084 Generalized abdominal pain: Secondary | ICD-10-CM | POA: Insufficient documentation

## 2016-11-20 DIAGNOSIS — F17210 Nicotine dependence, cigarettes, uncomplicated: Secondary | ICD-10-CM | POA: Insufficient documentation

## 2016-11-20 DIAGNOSIS — R110 Nausea: Secondary | ICD-10-CM | POA: Insufficient documentation

## 2016-11-20 DIAGNOSIS — J45909 Unspecified asthma, uncomplicated: Secondary | ICD-10-CM | POA: Insufficient documentation

## 2016-11-20 DIAGNOSIS — K5090 Crohn's disease, unspecified, without complications: Secondary | ICD-10-CM | POA: Insufficient documentation

## 2016-11-20 LAB — LIPASE, BLOOD: LIPASE: 28 U/L (ref 11–51)

## 2016-11-20 LAB — COMPREHENSIVE METABOLIC PANEL
ALT: 46 U/L (ref 17–63)
AST: 29 U/L (ref 15–41)
Albumin: 4.5 g/dL (ref 3.5–5.0)
Alkaline Phosphatase: 70 U/L (ref 38–126)
Anion gap: 8 (ref 5–15)
BUN: 12 mg/dL (ref 6–20)
CHLORIDE: 101 mmol/L (ref 101–111)
CO2: 25 mmol/L (ref 22–32)
CREATININE: 0.89 mg/dL (ref 0.61–1.24)
Calcium: 9.1 mg/dL (ref 8.9–10.3)
GFR calc non Af Amer: >60 mL/min (ref >60)
Glucose, Bld: 101 mg/dL — ABNORMAL HIGH (ref 65–99)
Potassium: 4.1 mmol/L (ref 3.5–5.1)
Sodium: 134 mmol/L — ABNORMAL LOW (ref 135–145)
Total Bilirubin: 0.7 mg/dL (ref 0.3–1.2)
Total Protein: 7.1 g/dL (ref 6.5–8.1)

## 2016-11-20 LAB — CBC
HCT: 46.2 % (ref 39.0–52.0)
Hemoglobin: 15.6 g/dL (ref 13.0–17.0)
MCH: 29.2 pg (ref 26.0–34.0)
MCHC: 33.8 g/dL (ref 30.0–36.0)
MCV: 86.5 fL (ref 78.0–100.0)
Platelets: 247 10*3/uL (ref 150–400)
RBC: 5.34 MIL/uL (ref 4.22–5.81)
RDW: 13.1 % (ref 11.5–15.5)
WBC: 6.1 10*3/uL (ref 4.0–10.5)

## 2016-11-20 LAB — URINALYSIS, ROUTINE W REFLEX MICROSCOPIC
BILIRUBIN URINE: NEGATIVE
Glucose, UA: NEGATIVE mg/dL
Hgb urine dipstick: NEGATIVE
KETONES UR: NEGATIVE mg/dL
LEUKOCYTES UA: NEGATIVE
Nitrite: NEGATIVE
PH: 7 (ref 5.0–8.0)
Protein, ur: NEGATIVE mg/dL
SPECIFIC GRAVITY, URINE: 1.002 — AB (ref 1.005–1.030)

## 2016-11-20 MED ORDER — ONDANSETRON 4 MG PO TBDP
ORAL_TABLET | ORAL | Status: AC
  Filled 2016-11-20: qty 1

## 2016-11-20 MED ORDER — ONDANSETRON 4 MG PO TBDP
4.0000 mg | ORAL_TABLET | Freq: Once | ORAL | Status: AC | PRN
  Administered 2016-11-20: 4 mg via ORAL

## 2016-11-20 NOTE — ED Triage Notes (Signed)
Pt has had increasing abdominal pain since Friday.  Pt has hx of Crohns.  Pt reports increasing pain with bloating, indigestion, had some fever and chills as well as HA and lots of diarrhea (which he reports is like "straight water")

## 2016-11-21 ENCOUNTER — Emergency Department (HOSPITAL_COMMUNITY)
Admission: EM | Admit: 2016-11-21 | Discharge: 2016-11-21 | Disposition: A | Discharge status: Home / Self Care | Payer: Self-pay | Attending: Emergency Medicine | Admitting: Emergency Medicine

## 2016-11-21 ENCOUNTER — Emergency Department (HOSPITAL_COMMUNITY): Payer: Self-pay

## 2016-11-21 DIAGNOSIS — R197 Diarrhea, unspecified: Secondary | ICD-10-CM

## 2016-11-21 DIAGNOSIS — R1084 Generalized abdominal pain: Secondary | ICD-10-CM

## 2016-11-21 MED ORDER — IOPAMIDOL (ISOVUE-300) INJECTION 61%
INTRAVENOUS | Status: AC
  Administered 2016-11-21: 100 mL
  Filled 2016-11-21: qty 100

## 2016-11-21 MED ORDER — ONDANSETRON HCL 4 MG/2ML IJ SOLN
4.0000 mg | Freq: Once | INTRAMUSCULAR | Status: AC
  Administered 2016-11-21: 4 mg via INTRAVENOUS
  Filled 2016-11-21: qty 2

## 2016-11-21 MED ORDER — SODIUM CHLORIDE 0.9 % IV BOLUS (SEPSIS)
1000.0000 mL | Freq: Once | INTRAVENOUS | Status: AC
Start: 2016-11-21 — End: 2016-11-21
  Administered 2016-11-21: 1000 mL via INTRAVENOUS

## 2016-11-21 MED ORDER — LOPERAMIDE HCL 2 MG PO CAPS: 2.0000 mg | ORAL_CAPSULE | Freq: Four times a day (QID) | ORAL | 0 refills | Status: DC | PRN

## 2016-11-21 MED ORDER — DICYCLOMINE HCL 10 MG/ML IM SOLN
20.0000 mg | Freq: Once | INTRAMUSCULAR | Status: AC
  Administered 2016-11-21: 20 mg via INTRAMUSCULAR
  Filled 2016-11-21: qty 2

## 2016-11-21 MED ORDER — MORPHINE SULFATE (PF) 4 MG/ML IV SOLN
4.0000 mg | Freq: Once | INTRAVENOUS | Status: AC
  Administered 2016-11-21: 4 mg via INTRAVENOUS
  Filled 2016-11-21: qty 1

## 2016-11-21 MED ORDER — PROMETHAZINE HCL 25 MG PO TABS: 25.0000 mg | ORAL_TABLET | Freq: Four times a day (QID) | ORAL | 0 refills | Status: DC | PRN

## 2016-11-21 MED ORDER — LOPERAMIDE HCL 2 MG PO CAPS
4.0000 mg | ORAL_CAPSULE | Freq: Once | ORAL | Status: AC
  Administered 2016-11-21: 4 mg via ORAL
  Filled 2016-11-21: qty 2

## 2016-11-21 MED ORDER — DICYCLOMINE HCL 20 MG PO TABS: 20.0000 mg | ORAL_TABLET | Freq: Three times a day (TID) | ORAL | 0 refills | Status: DC

## 2016-11-21 NOTE — ED Provider Notes (Signed)
By signing my name below, I, Brendan Ochoa, attest that this documentation has been prepared under the direction and in the presence of Brendan Ochoa, Brendan Bison, DO. Electronically Signed: Georgette Ochoa, ED Scribe. 11/21/16. 1:53 AM.  TIME SEEN: 1:42 AM  CHIEF COMPLAINT:  Chief Complaint  Patient presents with  . Abdominal Pain    hx crohns   HPI:  HPI Comments: Brendan Ochoa is a 41 y.o. male with h/o Crohn's disease, cocaine and alcohol abuse, and asthma, who presents to the Emergency Department complaining of gradually worsening, generalized abdominal pain beginning six days ago. Pain is cramping and tight in quality. Pt also has associated diarrhea, nausea, increase in belching, decreased appetite, headache, and generalized weakness. States he had a subjective fever and chills at the onset of his symptoms that has since seemed to resolve. He has tried an OTC medication for GERD with no relief to his symptoms. Pt has h/o Crohn's disease and regularly experiences abdominal pain but states this is notably different. No h/o abdominal surgeries, recent antibiotic use, Hospitalization, sick contact or travel. . Pt denies vomiting, dysuria, hematuria, melena, hematochezia, or any other associated symptoms.   ROS: See HPI Constitutional: Subjective fever  Eyes: no drainage  ENT: no runny nose   Cardiovascular:  no chest pain  Resp: no SOB  GI: no vomiting GU: no dysuria Integumentary: no rash  Allergy: no hives  Musculoskeletal: no leg swelling  Neurological: no slurred speech ROS otherwise negative  PAST MEDICAL HISTORY/PAST SURGICAL HISTORY:  Past Medical History:  Diagnosis Date  . Asthma   . Cocaine abuse   . Crohn disease (Brendan Ochoa)   . Depression   . ETOH abuse    MEDICATIONS:  Prior to Admission medications   Medication Sig Start Date End Date Taking? Authorizing Provider  chlordiazePOXIDE (LIBRIUM) 25 MG capsule Take 2 capsules (50 mg total) by mouth 3 (three) times daily as needed for  anxiety. 34m by mouth q 6 h day 1 518mby mouth q 8 h day 2 5068my mouth q 12 h day 3 7m33m mough QHS day 4 09/02/16   SandLarene Pickett-C  Probiotic Product (PROBIOTIC PO) Take 1 tablet by mouth daily.    [provider]    ALLERGIES:  No Known Allergies  SOCIAL HISTORY:  Social History  Substance Use Topics  . Smoking status: Current Every Day Smoker    Packs/day: 0.25    Years: 15.00    Types: Cigarettes  . Smokeless tobacco: Never Used  . Alcohol use Yes    FAMILY HISTORY: No family history on file.  EXAM: BP 115/75   Pulse 67   Temp 97.6 F (36.4 C) (Oral)   Resp 16   SpO2 97%  CONSTITUTIONAL: Alert and oriented and responds appropriately to questions. Well-appearing; well-nourished, Afebrile and nontoxic appearing HEAD: Normocephalic EYES: Conjunctivae clear, pupils appear equal, EOMI ENT: normal nose; moist mucous membranes NECK: Supple, no meningismus, no nuchal rigidity, no LAD  CARD: RRR; S1 and S2 appreciated; no murmurs, no clicks, no rubs, no gallops RESP: Normal chest excursion without splinting or tachypnea; breath sounds clear and equal bilaterally; no wheezes, no rhonchi, no rales, no hypoxia or respiratory distress, speaking full sentences ABD/GI: Normal bowel sounds; non-distended; soft, diffusely tender throughout the abdomen, no rebound, no guarding, no peritoneal signs, no hepatosplenomegaly BACK:  The back appears normal and is non-tender to palpation, there is no CVA tenderness EXT: Normal ROM in all joints; non-tender to palpation;  no edema; normal capillary refill; no cyanosis, no calf tenderness or swelling    SKIN: Normal color for age and race; warm; no rash NEURO: Moves all extremities equally PSYCH: The patient's mood and manner are appropriate. Grooming and personal hygiene are appropriate.  MEDICAL DECISION MAKING: Patient here with diffuse abdominal tenderness, nausea and diarrhea. Has history of Crohn's disease but states  this feels different. He is not on medications for his Crohn's disease. States he was previously followed by Dr. Benson Norway.  Labs here are unremarkable. Urine shows no sign of infection.  We'll proceed with CT of abdomen and pelvis for further evaluation. Differential diagnosis includes colitis, diverticulitis, appendicitis, bowel obstruction, cholecystitis.  ED PROGRESS: CT scan unremarkable. Normal-appearing appendix. Normal gallbladder. Pain is improved significantly. He has been able to drink without vomiting. He has not had any diarrhea here in the hospital. Suspicion that this is C. difficile or other bacterial infectious etiology for his diarrhea given no recent travel, hospitalization, antibiotic use and he is not immunocompromised. I did recommend close follow-up with her primary care provider as well as his GI physician. No sign of Crohn's flare on scan. We'll discharge with prescriptions for Phenergan, Bentyl, Imodium. Patient and mother at bedside are comfortable with this plan.   At this time, I do not feel there is any life-threatening condition present. I have reviewed and discussed all results (EKG, imaging, lab, urine as appropriate) and exam findings with patient/family. I have reviewed nursing notes and appropriate previous records.  I feel the patient is safe to be discharged home without further emergent workup and can continue workup as an outpatient as needed. Discussed usual and customary return precautions. Patient/family verbalize understanding and are comfortable with this plan.  Outpatient follow-up has been provided if needed. All questions have been answered.   I personally performed the services described in this documentation, which was scribed in my presence. The recorded information has been reviewed and is accurate.     Md Smola, Brendan Bison, DO 11/21/16 [phone]

## 2016-11-21 NOTE — ED Notes (Signed)
Patient transported to CT 

## 2016-11-21 NOTE — ED Notes (Signed)
Pt c/o increase in weakness and dizziness.

## 2017-03-07 ENCOUNTER — Ambulatory Visit: Payer: Self-pay | Admitting: Physician Assistant

## 2017-03-11 ENCOUNTER — Encounter: Payer: Self-pay | Admitting: Physician Assistant

## 2017-04-18 ENCOUNTER — Encounter: Payer: Self-pay | Admitting: Physician Assistant

## 2017-04-18 ENCOUNTER — Ambulatory Visit: Payer: Self-pay | Admitting: Physician Assistant

## 2017-04-18 VITALS — BP 116/68 | HR 93 | Temp 98.0°F | Ht 70.5 in | Wt 158.0 lb

## 2017-04-18 DIAGNOSIS — F1410 Cocaine abuse, uncomplicated: Secondary | ICD-10-CM

## 2017-04-18 DIAGNOSIS — F17210 Nicotine dependence, cigarettes, uncomplicated: Secondary | ICD-10-CM

## 2017-04-18 DIAGNOSIS — K5090 Crohn's disease, unspecified, without complications: Secondary | ICD-10-CM

## 2017-04-18 DIAGNOSIS — Z13220 Encounter for screening for lipoid disorders: Secondary | ICD-10-CM

## 2017-04-18 DIAGNOSIS — F1010 Alcohol abuse, uncomplicated: Secondary | ICD-10-CM

## 2017-04-18 NOTE — Progress Notes (Signed)
BP 116/68 (BP Location: Left Arm, Patient Position: Sitting, Cuff Size: Normal)   Pulse 93   Temp 98 F (36.7 C) (Other (Comment))   Ht 5' 10.5" (1.791 m)   Wt 158 lb (71.7 kg)   SpO2 98%   BMI 22.35 kg/m    Subjective:    Patient ID: Brendan Ochoa, male    DOB: 25-Dec-1975, 41 y.o.   MRN: [id number]  HPI: Brendan Ochoa is a 41 y.o. male presenting on 04/18/2017 for No chief complaint on file.   HPI Pt started drinking again recently. Also a little bit back into cocaine as well.   He is hoping to get in to [facility] soon.  Pt had flare Crohn's in July- went to [facility].  He says he has had several flares recently.  He thinks it is because he hasn't been taking care of himself.   Pt has not seen GI doctor in about 10 years.    Pt says he went to [facility] several months ago to try to get into a program but he says it wasn't the right program for him.   Relevant past medical, surgical, family and social history reviewed and updated as indicated. Interim medical history since our last visit reviewed. Allergies and medications reviewed and updated.   Current Outpatient Medications:  .  B Complex Vitamins (B-COMPLEX/B-12 SL), Place 1-2 tablets under the tongue 2 (two) times daily as needed., Disp: , Rfl:  .  ferrous sulfate 325 (65 FE) MG tablet, Take 325 mg by mouth daily with breakfast., Disp: , Rfl:  .  glucose 4 GM chewable tablet, Chew 1 tablet by mouth as needed for low blood sugar., Disp: , Rfl:  .  ibuprofen (ADVIL,MOTRIN) 200 MG tablet, Take 400 mg by mouth daily as needed., Disp: , Rfl:  .  loperamide (IMODIUM) 2 MG capsule, Take 1 capsule (2 mg total) by mouth 4 (four) times daily as needed for diarrhea or loose stools., Disp: 12 capsule, Rfl: 0 .  Probiotic Product (PROBIOTIC PO), Take 1 tablet by mouth daily., Disp: , Rfl:  .  ranitidine (ZANTAC) 150 MG capsule, Take 150 mg by mouth daily as needed for heartburn., Disp: , Rfl:    Review of Systems   Constitutional: Positive for fatigue. Negative for appetite change, chills, diaphoresis, fever and unexpected weight change.  HENT: Positive for dental problem. Negative for congestion, drooling, ear pain, facial swelling, hearing loss, mouth sores, sneezing, sore throat, trouble swallowing and voice change.   Eyes: Negative for pain, discharge, redness, itching and visual disturbance.  Respiratory: Negative for cough, choking, shortness of breath and wheezing.   Cardiovascular: Negative for chest pain, palpitations and leg swelling.  Gastrointestinal: Negative for abdominal pain, blood in stool, constipation, diarrhea and vomiting.  Endocrine: Negative for cold intolerance, heat intolerance and polydipsia.  Genitourinary: Negative for decreased urine volume, dysuria and hematuria.  Musculoskeletal: Negative for arthralgias, back pain and gait problem.  Skin: Negative for rash.  Allergic/Immunologic: Negative for environmental allergies.  Neurological: Negative for seizures, syncope, light-headedness and headaches.  Hematological: Negative for adenopathy.  Psychiatric/Behavioral: Negative for agitation, dysphoric mood and suicidal ideas. The patient is not nervous/anxious.     Per HPI unless specifically indicated above     Objective:    BP 116/68 (BP Location: Left Arm, Patient Position: Sitting, Cuff Size: Normal)   Pulse 93   Temp 98 F (36.7 C) (Other (Comment))   Ht 5' 10.5" (1.791 m)   Wt  158 lb (71.7 kg)   SpO2 98%   BMI 22.35 kg/m   Wt Readings from Last 3 Encounters:  04/18/17 158 lb (71.7 kg)  09/02/16 169 lb 1 oz (76.7 kg)  03/08/16 169 lb 8 oz (76.9 kg)    Physical Exam  Constitutional: He is oriented to person, place, and time. He appears well-developed and well-nourished.  HENT:  Head: Normocephalic and atraumatic.  Neck: Neck supple.  Cardiovascular: Normal rate and regular rhythm.  Pulmonary/Chest: Effort normal and breath sounds normal. He has no wheezes.   Abdominal: Soft. Bowel sounds are normal. There is no hepatosplenomegaly. There is no tenderness.  Musculoskeletal: He exhibits no edema.  Lymphadenopathy:    He has no cervical adenopathy.  Neurological: He is alert and oriented to person, place, and time.  Skin: Skin is warm and dry.  Psychiatric: He has a normal mood and affect. His behavior is normal.  Vitals reviewed.   Results for orders placed or performed during the hospital encounter of 11/21/16  Lipase, blood  Result Value Ref Range   Lipase 28 11 - 51 U/L  Comprehensive metabolic panel  Result Value Ref Range   Sodium 134 (L) 135 - 145 mmol/L   Potassium 4.1 3.5 - 5.1 mmol/L   Chloride 101 101 - 111 mmol/L   CO2 25 22 - 32 mmol/L   Glucose, Bld 101 (H) 65 - 99 mg/dL   BUN 12 6 - 20 mg/dL   Creatinine, Ser 1.61 0.61 - 1.24 mg/dL   Calcium 9.1 8.9 - 09.6 mg/dL   Total Protein 7.1 6.5 - 8.1 g/dL   Albumin 4.5 3.5 - 5.0 g/dL   AST 29 15 - 41 U/L   ALT 46 17 - 63 U/L   Alkaline Phosphatase 70 38 - 126 U/L   Total Bilirubin 0.7 0.3 - 1.2 mg/dL   GFR calc non Af Amer >60 >60 mL/min   GFR calc Af Amer >60 >60 mL/min   Anion gap 8 5 - 15  CBC  Result Value Ref Range   WBC 6.1 4.0 - 10.5 K/uL   RBC 5.34 4.22 - 5.81 MIL/uL   Hemoglobin 15.6 13.0 - 17.0 g/dL   HCT 04.5 40.9 - 81.1 %   MCV 86.5 78.0 - 100.0 fL   MCH 29.2 26.0 - 34.0 pg   MCHC 33.8 30.0 - 36.0 g/dL   RDW 91.4 78.2 - 95.6 %   Platelets 247 150 - 400 K/uL  Urinalysis, Routine w reflex microscopic  Result Value Ref Range   Color, Urine COLORLESS (A) YELLOW   APPearance CLEAR CLEAR   Specific Gravity, Urine 1.002 (L) 1.005 - 1.030   pH 7.0 5.0 - 8.0   Glucose, UA NEGATIVE NEGATIVE mg/dL   Hgb urine dipstick NEGATIVE NEGATIVE   Bilirubin Urine NEGATIVE NEGATIVE   Ketones, ur NEGATIVE NEGATIVE mg/dL   Protein, ur NEGATIVE NEGATIVE mg/dL   Nitrite NEGATIVE NEGATIVE   Leukocytes, UA NEGATIVE NEGATIVE      Assessment & Plan:   Encounter Diagnoses   Name Primary?  . Crohn's disease without complication, unspecified gastrointestinal tract location (HCC) Yes  . Cigarette nicotine dependence, uncomplicated   . Alcohol abuse   . Cocaine abuse (HCC)   . Screening cholesterol level       -Refer to GI for Crohn's -pt was given [facility] application -Update labs- cbc, cmp, lipids -Gave pt [facility] house form -counseled pt to avoid NSAIDS due to the Crohns -pt to follow up  here 1 month.  RTO sooner prn

## 2017-04-19 ENCOUNTER — Other Ambulatory Visit (HOSPITAL_COMMUNITY)
Admission: RE | Admit: 2017-04-19 | Discharge: 2017-04-19 | Disposition: A | Discharge status: Home / Self Care | Payer: Self-pay | Source: Ambulatory Visit | Attending: Physician Assistant | Admitting: Physician Assistant

## 2017-04-19 DIAGNOSIS — K5090 Crohn's disease, unspecified, without complications: Secondary | ICD-10-CM | POA: Insufficient documentation

## 2017-04-19 DIAGNOSIS — Z13220 Encounter for screening for lipoid disorders: Secondary | ICD-10-CM | POA: Insufficient documentation

## 2017-04-19 LAB — COMPREHENSIVE METABOLIC PANEL
ALBUMIN: 4.3 g/dL (ref 3.5–5.0)
ALK PHOS: 63 U/L (ref 38–126)
ALT: 16 U/L — ABNORMAL LOW (ref 17–63)
AST: 22 U/L (ref 15–41)
Anion gap: 6 (ref 5–15)
BUN: 15 mg/dL (ref 6–20)
CALCIUM: 9.3 mg/dL (ref 8.9–10.3)
CO2: 27 mmol/L (ref 22–32)
CREATININE: 1.02 mg/dL (ref 0.61–1.24)
Chloride: 102 mmol/L (ref 101–111)
GFR calc non Af Amer: >60 mL/min (ref >60)
GLUCOSE: 90 mg/dL (ref 65–99)
Potassium: 4.2 mmol/L (ref 3.5–5.1)
SODIUM: 135 mmol/L (ref 135–145)
Total Bilirubin: 1 mg/dL (ref 0.3–1.2)
Total Protein: 7.4 g/dL (ref 6.5–8.1)

## 2017-04-19 LAB — CBC
HEMATOCRIT: 49.6 % (ref 39.0–52.0)
HEMOGLOBIN: 16.6 g/dL (ref 13.0–17.0)
MCH: 30.3 pg (ref 26.0–34.0)
MCHC: 33.5 g/dL (ref 30.0–36.0)
MCV: 90.7 fL (ref 78.0–100.0)
Platelets: 236 10*3/uL (ref 150–400)
RBC: 5.47 MIL/uL (ref 4.22–5.81)
RDW: 13.4 % (ref 11.5–15.5)
WBC: 5.7 10*3/uL (ref 4.0–10.5)

## 2017-04-19 LAB — LIPID PANEL
CHOLESTEROL: 178 mg/dL (ref 0–200)
HDL: 65 mg/dL (ref >40)
LDL Cholesterol: 88 mg/dL (ref 0–99)
Total CHOL/HDL Ratio: 2.7 RATIO
Triglycerides: 124 mg/dL (ref <150)
VLDL: 25 mg/dL (ref 0–40)

## 2017-04-24 ENCOUNTER — Encounter: Payer: Self-pay | Admitting: Gastroenterology

## 2017-05-09 ENCOUNTER — Encounter: Payer: Self-pay | Admitting: Physician Assistant

## 2017-05-09 ENCOUNTER — Ambulatory Visit: Payer: Self-pay | Admitting: Physician Assistant

## 2017-05-09 VITALS — BP 113/71 | HR 71 | Temp 97.7°F

## 2017-05-09 DIAGNOSIS — G479 Sleep disorder, unspecified: Secondary | ICD-10-CM

## 2017-05-09 DIAGNOSIS — R51 Headache: Principal | ICD-10-CM

## 2017-05-09 DIAGNOSIS — E639 Nutritional deficiency, unspecified: Secondary | ICD-10-CM

## 2017-05-09 DIAGNOSIS — F17210 Nicotine dependence, cigarettes, uncomplicated: Secondary | ICD-10-CM

## 2017-05-09 DIAGNOSIS — R519 Headache, unspecified: Secondary | ICD-10-CM

## 2017-05-09 DIAGNOSIS — F1011 Alcohol abuse, in remission: Secondary | ICD-10-CM

## 2017-05-09 DIAGNOSIS — K5090 Crohn's disease, unspecified, without complications: Secondary | ICD-10-CM

## 2017-05-09 LAB — GLUCOSE, POCT (MANUAL RESULT ENTRY): POC Glucose: 99 mg/dl (ref 70–99)

## 2017-05-09 NOTE — Progress Notes (Signed)
BP 113/71   Pulse 71   Temp 97.7 F (36.5 C)   SpO2 93%    Subjective:    Patient ID: Brendan Ochoa, male    DOB: 01/27/1976, 41 y.o.   MRN: [id number]  HPI: Brendan Ochoa is a 41 y.o. male presenting on 05/09/2017 for Headache (? low blood sugar)   HPI   [facility]  He was there for 6 hours and he didn't eat while he was there.  He felt like his blood sugar was low but he didn't check it.   Pt ate about 9am today. he ate "some pieces of ham and cheese" and that is all.  Pt took a glucose table just before he came in.   [facility] and was sent here today for "low blood sugar" even though no one ever checked his blood sugar.    Pt has been having headaches frequently lately.  He says that they are not particularly horrible, just regular headaches, just more often lately.  Pt reports problems with sleeping- he says it is not unusual for him to only get a few hours of sleep.  He says his roommate snores which contributes to the problem.   Pt has appt with GI in early February  Relevant past medical, surgical, family and social history reviewed and updated as indicated. Interim medical history since our last visit reviewed. Allergies and medications reviewed and updated.   Current Outpatient Medications:  .  B Complex Vitamins (B-COMPLEX/B-12 SL), Place 1-2 tablets under the tongue 2 (two) times daily as needed., Disp: , Rfl:  .  ferrous sulfate 325 (65 FE) MG tablet, Take 325 mg by mouth daily with breakfast., Disp: , Rfl:  .  glucose 4 GM chewable tablet, Chew 1 tablet by mouth as needed for low blood sugar., Disp: , Rfl:  .  ranitidine (ZANTAC) 150 MG capsule, Take 150 mg by mouth daily as needed for heartburn., Disp: , Rfl:  .  Probiotic Product (PROBIOTIC PO), Take 1 tablet by mouth daily., Disp: , Rfl:    Review of Systems  Constitutional: Negative for appetite change, chills, diaphoresis, fatigue, fever and unexpected weight  change.  HENT: Negative for congestion, dental problem, drooling, ear pain, facial swelling, hearing loss, mouth sores, sneezing, sore throat, trouble swallowing and voice change.   Eyes: Negative for pain, discharge, redness, itching and visual disturbance.  Respiratory: Negative for cough, choking, shortness of breath and wheezing.   Cardiovascular: Negative for chest pain, palpitations and leg swelling.  Gastrointestinal: Positive for abdominal pain. Negative for blood in stool, constipation, diarrhea and vomiting.  Endocrine: Negative for cold intolerance, heat intolerance and polydipsia.  Genitourinary: Negative for decreased urine volume, dysuria and hematuria.  Musculoskeletal: Negative for arthralgias, back pain and gait problem.  Skin: Negative for rash.  Allergic/Immunologic: Negative for environmental allergies.  Neurological: Positive for headaches. Negative for seizures, syncope and light-headedness.  Hematological: Negative for adenopathy.  Psychiatric/Behavioral: Negative for agitation, dysphoric mood and suicidal ideas. The patient is not nervous/anxious.     Per HPI unless specifically indicated above     Objective:    BP 113/71   Pulse 71   Temp 97.7 F (36.5 C)   SpO2 93%   Wt Readings from Last 3 Encounters:  04/18/17 158 lb (71.7 kg)  09/02/16 169 lb 1 oz (76.7 kg)  03/08/16 169 lb 8 oz (76.9 kg)    Physical Exam  Constitutional:  He is oriented to person, place, and time. He appears well-developed and well-nourished.  HENT:  Head: Normocephalic and atraumatic.  Eyes: Conjunctivae and EOM are normal. Pupils are equal, round, and reactive to light.  Neck: Neck supple.  Cardiovascular: Normal rate and regular rhythm.  Pulmonary/Chest: Effort normal and breath sounds normal. He has no wheezes.  Abdominal: Soft. Bowel sounds are normal. There is no hepatosplenomegaly. There is no tenderness.  Musculoskeletal: He exhibits no edema.  Lymphadenopathy:    He has  no cervical adenopathy.  Neurological: He is alert and oriented to person, place, and time. He has normal strength. He displays no tremor and normal reflexes. No cranial nerve deficit or sensory deficit. He exhibits normal muscle tone. Coordination and gait normal.  Skin: Skin is warm and dry.  Psychiatric: He has a normal mood and affect. His behavior is normal.  Vitals reviewed.   Random bs 99     Assessment & Plan:     Encounter Diagnoses  Name Primary?  Marland Kitchen Nonintractable headache, unspecified chronicity pattern, unspecified headache type Yes  . Sleep difficulties   . Crohn's disease without complication, unspecified gastrointestinal tract location (HCC)   . Cigarette nicotine dependence, uncomplicated   . Alcohol abuse, in remission    -counseled pt at length about getting a proper breakfast to include protein in addition to the need for enough calories -will Refer to nutritionist for further education about proper nutrition -counseled pt on need for sleep to feel well -encouraged pt to discuss sleep with his therapist -pt to follow up in January as scheduled.

## 2017-05-21 ENCOUNTER — Ambulatory Visit: Payer: Self-pay | Admitting: Physician Assistant

## 2017-05-23 ENCOUNTER — Encounter: Payer: Self-pay | Admitting: Physician Assistant

## 2017-06-19 ENCOUNTER — Telehealth: Payer: Self-pay | Admitting: Gastroenterology

## 2017-06-19 ENCOUNTER — Encounter: Payer: Self-pay | Admitting: Gastroenterology

## 2017-06-19 ENCOUNTER — Ambulatory Visit: Payer: Self-pay | Admitting: Gastroenterology

## 2017-06-19 NOTE — Telephone Encounter (Signed)
PATIENT WAS A NO SHOW AND LETTER SENT  °

## 2017-06-27 ENCOUNTER — Encounter: Payer: Self-pay | Admitting: Physician Assistant

## 2017-06-27 ENCOUNTER — Ambulatory Visit: Payer: Self-pay | Admitting: Physician Assistant

## 2017-06-27 VITALS — BP 120/64 | HR 89 | Temp 98.1°F | Ht 70.5 in | Wt 164.8 lb

## 2017-06-27 DIAGNOSIS — F1011 Alcohol abuse, in remission: Secondary | ICD-10-CM

## 2017-06-27 DIAGNOSIS — K5090 Crohn's disease, unspecified, without complications: Secondary | ICD-10-CM

## 2017-06-27 DIAGNOSIS — K219 Gastro-esophageal reflux disease without esophagitis: Secondary | ICD-10-CM

## 2017-06-27 DIAGNOSIS — F17210 Nicotine dependence, cigarettes, uncomplicated: Secondary | ICD-10-CM

## 2017-06-27 NOTE — Progress Notes (Signed)
BP 120/64 (BP Location: Right Arm, Patient Position: Sitting, Cuff Size: Normal)   Pulse 89   Temp 98.1 F (36.7 C)   Ht 5' 10.5" (1.791 m)   Wt 164 lb 12 oz (74.7 kg)   SpO2 97%   BMI 23.31 kg/m    Subjective:    Patient ID: Brendan Ochoa, male    DOB: 03-14-1976, 42 y.o.   MRN: [id number]  HPI: Brendan Ochoa is a 42 y.o. male presenting on 06/27/2017 for Follow-up   HPI   Pt was no-show to GI.  He had appt for his Crohn's.  He rescheduled to April.    He complains of acid recently.  He started OTC omeprazole.   He says it seems to help.   He is no longer at [facility] house.  He says he is still clean.   He goes to AA.   He has appt with nutritionist in March.   He is still smoking.   Relevant past medical, surgical, family and social history reviewed and updated as indicated. Interim medical history since our last visit reviewed. Allergies and medications reviewed and updated.   Current Outpatient Medications:  .  B Complex Vitamins (B-COMPLEX/B-12 SL), Place 2 tablets under the tongue 2 (two) times daily. , Disp: , Rfl:  .  ferrous sulfate 325 (65 FE) MG tablet, Take 325 mg by mouth daily with breakfast., Disp: , Rfl:  .  glucose 4 GM chewable tablet, Chew 1 tablet by mouth as needed for low blood sugar., Disp: , Rfl:  .  omeprazole (PRILOSEC) 10 MG capsule, Take 10 mg by mouth daily., Disp: , Rfl:  .  Probiotic Product (PROBIOTIC PO), Take 1 tablet by mouth daily., Disp: , Rfl:  .  ranitidine (ZANTAC) 150 MG capsule, Take 150 mg by mouth daily as needed for heartburn., Disp: , Rfl:    Review of Systems  Constitutional: Negative for appetite change, chills, diaphoresis, fatigue, fever and unexpected weight change.  HENT: Negative for congestion, dental problem, drooling, ear pain, facial swelling, hearing loss, mouth sores, sneezing, sore throat, trouble swallowing and voice change.   Eyes: Negative for pain, discharge, redness, itching and visual disturbance.   Respiratory: Negative for cough, choking, shortness of breath and wheezing.   Cardiovascular: Negative for chest pain, palpitations and leg swelling.  Gastrointestinal: Negative for abdominal pain, blood in stool, constipation, diarrhea and vomiting.  Endocrine: Negative for cold intolerance, heat intolerance and polydipsia.  Genitourinary: Negative for decreased urine volume, dysuria and hematuria.  Musculoskeletal: Negative for arthralgias, back pain and gait problem.  Skin: Negative for rash.  Allergic/Immunologic: Negative for environmental allergies.  Neurological: Negative for seizures, syncope, light-headedness and headaches.  Hematological: Negative for adenopathy.  Psychiatric/Behavioral: Negative for agitation, dysphoric mood and suicidal ideas. The patient is not nervous/anxious.     Per HPI unless specifically indicated above     Objective:    BP 120/64 (BP Location: Right Arm, Patient Position: Sitting, Cuff Size: Normal)   Pulse 89   Temp 98.1 F (36.7 C)   Ht 5' 10.5" (1.791 m)   Wt 164 lb 12 oz (74.7 kg)   SpO2 97%   BMI 23.31 kg/m   Wt Readings from Last 3 Encounters:  06/27/17 164 lb 12 oz (74.7 kg)  04/18/17 158 lb (71.7 kg)  09/02/16 169 lb 1 oz (76.7 kg)    Physical Exam  Constitutional: He is oriented to person, place, and time. He appears well-developed and well-nourished.  HENT:  Head: Normocephalic and atraumatic.  Neck: Neck supple.  Cardiovascular: Normal rate and regular rhythm.  Pulmonary/Chest: Effort normal and breath sounds normal. He has no wheezes.  Abdominal: Soft. Bowel sounds are normal. There is no hepatosplenomegaly. There is no tenderness.  Musculoskeletal: He exhibits no edema.  Lymphadenopathy:    He has no cervical adenopathy.  Neurological: He is alert and oriented to person, place, and time.  Skin: Skin is warm and dry.  Psychiatric: He has a normal mood and affect. His behavior is normal.  Vitals reviewed.        Assessment & Plan:    Encounter Diagnoses  Name Primary?  . Alcohol abuse, in remission Yes  . Crohn's disease without complication, unspecified gastrointestinal tract location (HCC)   . Cigarette nicotine dependence, uncomplicated   . Gastroesophageal reflux disease, esophagitis presence not specified     -pt counseled on lifestyle changes for GERD.  He can continue to use OTC prilosec.  Return if it isn't working -counseled pt to continue to stay away from etoh -pt to follow up with GI as scheduled -pt to follow up 3 months. RTO sooner prn

## 2017-06-27 NOTE — Patient Instructions (Signed)
Gastroesophageal Reflux Disease, Adult Normally, food travels down the esophagus and stays in the stomach to be digested. However, when a person has gastroesophageal reflux disease (GERD), food and stomach acid move back up into the esophagus. When this happens, the esophagus becomes sore and inflamed. Over time, GERD can create small holes (ulcers) in the lining of the esophagus. What are the causes? This condition is caused by a problem with the muscle between the esophagus and the stomach (lower esophageal sphincter, or LES). Normally, the LES muscle closes after food passes through the esophagus to the stomach. When the LES is weakened or abnormal, it does not close properly, and that allows food and stomach acid to go back up into the esophagus. The LES can be weakened by certain dietary substances, medicines, and medical conditions, including:  Tobacco use.  Pregnancy.  Having a hiatal hernia.  Heavy alcohol use.  Certain foods and beverages, such as coffee, chocolate, onions, and peppermint.  What increases the risk? This condition is more likely to develop in:  People who have an increased body weight.  People who have connective tissue disorders.  People who use NSAID medicines.  What are the signs or symptoms? Symptoms of this condition include:  Heartburn.  Difficult or painful swallowing.  The feeling of having a lump in the throat.  Abitter taste in the mouth.  Bad breath.  Having a large amount of saliva.  Having an upset or bloated stomach.  Belching.  Chest pain.  Shortness of breath or wheezing.  Ongoing (chronic) cough or a night-time cough.  Wearing away of tooth enamel.  Weight loss.  Different conditions can cause chest pain. Make sure to see your health care provider if you experience chest pain. How is this diagnosed? Your health care provider will take a medical history and perform a physical exam. To determine if you have mild or severe  GERD, your health care provider may also monitor how you respond to treatment. You may also have other tests, including:  An endoscopy toexamine your stomach and esophagus with a small camera.  A test thatmeasures the acidity level in your esophagus.  A test thatmeasures how much pressure is on your esophagus.  A barium swallow or modified barium swallow to show the shape, size, and functioning of your esophagus.  How is this treated? The goal of treatment is to help relieve your symptoms and to prevent complications. Treatment for this condition may vary depending on how severe your symptoms are. Your health care provider may recommend:  Changes to your diet.  Medicine.  Surgery.  Follow these instructions at home: Diet  Follow a diet as recommended by your health care provider. This may involve avoiding foods and drinks such as: ? Coffee and tea (with or without caffeine). ? Drinks that containalcohol. ? Energy drinks and sports drinks. ? Carbonated drinks or sodas. ? Chocolate and cocoa. ? Peppermint and mint flavorings. ? Garlic and onions. ? Horseradish. ? Spicy and acidic foods, including peppers, chili powder, curry powder, vinegar, hot sauces, and barbecue sauce. ? Citrus fruit juices and citrus fruits, such as oranges, lemons, and limes. ? Tomato-based foods, such as red sauce, chili, salsa, and pizza with red sauce. ? Fried and fatty foods, such as donuts, french fries, potato chips, and high-fat dressings. ? High-fat meats, such as hot dogs and fatty cuts of red and white meats, such as rib eye steak, sausage, ham, and bacon. ? High-fat dairy items, such as whole milk,   butter, and cream cheese.  Eat small, frequent meals instead of large meals.  Avoid drinking large amounts of liquid with your meals.  Avoid eating meals during the 2-3 hours before bedtime.  Avoid lying down right after you eat.  Do not exercise right after you eat. General  instructions  Pay attention to any changes in your symptoms.  Take over-the-counter and prescription medicines only as told by your health care provider. Do not take aspirin, ibuprofen, or other NSAIDs unless your health care provider told you to do so.  Do not use any tobacco products, including cigarettes, chewing tobacco, and e-cigarettes. If you need help quitting, ask your health care provider.  Wear loose-fitting clothing. Do not wear anything tight around your waist that causes pressure on your abdomen.  Raise (elevate) the head of your bed 6 inches (15cm).  Try to reduce your stress, such as with yoga or meditation. If you need help reducing stress, ask your health care provider.  If you are overweight, reduce your weight to an amount that is healthy for you. Ask your health care provider for guidance about a safe weight loss goal.  Keep all follow-up visits as told by your health care provider. This is important. Contact a health care provider if:  You have new symptoms.  You have unexplained weight loss.  You have difficulty swallowing, or it hurts to swallow.  You have wheezing or a persistent cough.  Your symptoms do not improve with treatment.  You have a hoarse voice. Get help right away if:  You have pain in your arms, neck, jaw, teeth, or back.  You feel sweaty, dizzy, or light-headed.  You have chest pain or shortness of breath.  You vomit and your vomit looks like blood or coffee grounds.  You faint.  Your stool is bloody or black.  You cannot swallow, drink, or eat. This information is not intended to replace advice given to you by your health care provider. Make sure you discuss any questions you have with your health care provider. Document Released: 02/07/2005 Document Revised: 09/28/2015 Document Reviewed: 08/25/2014 Elsevier Interactive Patient Education  2018 [employer]  

## 2017-07-23 ENCOUNTER — Encounter: Payer: Self-pay | Admitting: Nutrition

## 2017-07-23 ENCOUNTER — Encounter: Payer: Self-pay | Attending: Physician Assistant | Admitting: Nutrition

## 2017-07-23 VITALS — Ht 71.0 in | Wt 165.0 lb

## 2017-07-23 DIAGNOSIS — K5000 Crohn's disease of small intestine without complications: Secondary | ICD-10-CM

## 2017-07-23 NOTE — Progress Notes (Signed)
Medical Nutrition Therapy:  Appt start time: 1330 end time:  1430.   Assessment:  Primary concerns today: Crohns. Lives with his parents.  He cooks and his mom does some cooking also.Unable to work due to having Crohn's disease. He has had it for 20 yrs ago. Wt fluctuates between 165-170 lbs usually. Family history of stomach cancer and Crohn's Disease on his moms's side. His paternal grandfather just died recently of stomach cancer. He notes that he has had a lot of stomach issues with diarrhea, stomach pain, bloating, nausea. Eats 5-6 small meals per day. Grazes all day long. Complains of being hungry all the time and cant get enough to eat at times. Brett Albino causes a lot of problem He reports knowing to avoid high fatty foods, grease, sugar, sweets and dairy.However, he is not consist in avoiding these. He didn't know about avoiding high fiber foods. He notes he eats things he shouldn't eat and then has to deal with consequences of stomach pain, diarrhea, and bloating. He hasn't noticed any blood in his stools.  He has been taking B12 daily and Iron because he has felt so weak and lack of energy lately. He notes he sometimes feel better after taking B12 and sometimes doesn't notice any difference. He is also taking a probiotic but not consistently. Doesn't like yogurt but may try some. He is not on any steroids but takes it if his symptoms are really bad. He is engaged and motivated to work on his diet to improve his issues with Crohn's. He wants to try and go Gluten free also to help and I think that would be beneficial for now for GI recovery and healing. HE would like to gain weight.   Current diet is contributing to his flare up of his Crohn's with eating a higher fat, higher fiber and processed foods typically.  Preferred Learning Style:   Auditory  Visual  Hands on  No preference indicated   Learning Readiness:  Ready  Change in progress   MEDICATIONS:    DIETARY INTAKE:     24-hr recall:  B ( AM): scrambled eggs, ham, cheese and grits, water  Snk ( AM): peanuts and L ( PM): hamburger and onions, mushrooms and toss salad, water Snk ( PM): D ( PM): same as lunch. Snk ( PM): cheese or misc Beverages: water  Usual physical activity: ADL, chores around the farm.  Estimated energy needs: 2200  calories 248 g carbohydrates 165 g protein 56 grams g fat  Progress Towards Goal(s):  In progress.   Nutritional Diagnosis:  NI-5.11.2 Predicted excessive nutrient intake As related to Crohns.  As evidenced by chronic diarrhea, abdominal pain, bloating .    Intervention: Nutrition Therapy  For Crohns Disease Foods to avoid and foods that are recommended. Meal planning.    Goals 1. Follow Nutrition Guidelines for Crohn's disease. 2. Avoid high fat, high fiber and concentrated sweets 3. Follow Gluten Free diet 4. Increase lower carb vegetables and fruits. 5. Drink only water 6. Keep a food journal of foods and symptoms to find foods that irritate GI system. Gain 2 lbs per month Eat small frequent meals-5-6 times per day.   Teaching Method Utilized: Visual Auditory Hands on  Handouts given during visit include:  The Plate Method  Nutrition Therapy for Crohn's Disease  Barriers to learning/adherence to lifestyle change:   Demonstrated degree of understanding via:  Teach Back   Monitoring/Evaluation:  Dietary intake, exercise, meal planning, and body weight in 1  month(s).

## 2017-07-23 NOTE — Patient Instructions (Signed)
Goals 1. Follow Nutrition Guidelines for Crohn's disease. 2. Avoid high fat, high fiber and concentrated sweets 3. Follow Gluten Free diet 4. Increase lower carb vegetables and fruits. 5. Drink only water 6. Keep a food journal of foods and symptoms to find foods that irritate GI system. Gain 2 lbs per month Eat small frequent meals-5-6 times per day.

## 2017-07-25 ENCOUNTER — Encounter: Payer: Self-pay | Admitting: Nutrition

## 2017-08-14 ENCOUNTER — Encounter: Payer: Self-pay | Admitting: Gastroenterology

## 2017-08-14 ENCOUNTER — Other Ambulatory Visit: Payer: Self-pay

## 2017-08-14 ENCOUNTER — Other Ambulatory Visit: Payer: Self-pay | Admitting: *Deleted

## 2017-08-14 ENCOUNTER — Ambulatory Visit (INDEPENDENT_AMBULATORY_CARE_PROVIDER_SITE_OTHER): Payer: Self-pay | Admitting: Gastroenterology

## 2017-08-14 VITALS — BP 113/71 | HR 94 | Temp 97.4°F | Ht 71.0 in | Wt 166.4 lb

## 2017-08-14 DIAGNOSIS — K219 Gastro-esophageal reflux disease without esophagitis: Secondary | ICD-10-CM

## 2017-08-14 DIAGNOSIS — K5090 Crohn's disease, unspecified, without complications: Secondary | ICD-10-CM

## 2017-08-14 MED ORDER — PEG 3350-KCL-NA BICARB-NACL 420 G PO SOLR: 4000.0000 mL | ORAL | 0 refills | Status: DC

## 2017-08-14 MED ORDER — PANTOPRAZOLE SODIUM 40 MG PO TBEC: 40.0000 mg | DELAYED_RELEASE_TABLET | Freq: Every day | ORAL | 3 refills | Status: DC

## 2017-08-14 NOTE — Assessment & Plan Note (Addendum)
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 Originally diagnosed by Dr. Benson Norway in 2008 at time of colonoscopy and was placed on prednisone taper and Asacol with good results. He has been off of medications for quite some time and notes chronic loose stool, intermittent flares described as abdominal pain and increased frequency of stool, occasional rectal bleeding. No weight loss noted. Recent CBC normal. History of cocaine use but none in several years; he also has a history of ETOH abuse but has gone through treatment programs and working on sobriety. Needs updated colonoscopy now. Will attempt to obtain procedure notes and path from 2008.  Proceed with colonoscopy with Dr. Oneida Alar in the near future. The risks, benefits, and alternatives have been discussed in detail with the patient. They state understanding and desire to proceed.  PROPOFOL due to past history of substance abuse Smoking cessation Avoidance of NSAIDs  Return after procedures for follow-up   ADDENDUM as of 6/3: I have tried multiple times to obtain records. As of today, still no records received despite multiple requests as noted in phone documentation.

## 2017-08-14 NOTE — Patient Instructions (Addendum)
Please have blood work done at the hospital.   I have given you a prescription for reflux called Protonix. Take this 30 minutes before breakfast daily. It is best absorbed on an empty stomach and works the best this way. I would only take Zantac as needed.  As we discussed, smoking cessation is important. I know you are making strides in this direction. I would avoid any Ibuprofen, Advil, Aleve, etc as well .  We have scheduled you for a colonoscopy and possible upper endoscopy with Dr. Darrick Penna in the near future.  I will see you after the procedures!  It was a pleasure to see you today. I strive to create trusting relationships with patients to provide genuine, compassionate, and quality care. I value your feedback. If you receive a survey regarding your visit,  I greatly appreciate you taking time to fill this out.   Gelene Mink, PhD, ANP-BC [facility]    Food Choices for Gastroesophageal Reflux Disease, Adult When you have gastroesophageal reflux disease (GERD), the foods you eat and your eating habits are very important. Choosing the right foods can help ease the discomfort of GERD. Consider working with a diet and nutrition specialist (dietitian) to help you make healthy food choices. What general guidelines should I follow? Eating plan  Choose healthy foods low in fat, such as fruits, vegetables, whole grains, low-fat dairy products, and lean meat, fish, and poultry.  Eat frequent, small meals instead of three large meals each day. Eat your meals slowly, in a relaxed setting. Avoid bending over or lying down until 2-3 hours after eating.  Limit high-fat foods such as fatty meats or fried foods.  Limit your intake of oils, butter, and shortening to less than 8 teaspoons each day.  Avoid the following: ? Foods that cause symptoms. These may be different for different people. Keep a food diary to keep track of foods that cause symptoms. ? Alcohol. ? Drinking large  amounts of liquid with meals. ? Eating meals during the 2-3 hours before bed.  Cook foods using methods other than frying. This may include baking, grilling, or broiling. Lifestyle   Maintain a healthy weight. Ask your health care provider what weight is healthy for you. If you need to lose weight, work with your health care provider to do so safely.  Exercise for at least 30 minutes on 5 or more days each week, or as told by your health care provider.  Avoid wearing clothes that fit tightly around your waist and chest.  Do not use any products that contain nicotine or tobacco, such as cigarettes and e-cigarettes. If you need help quitting, ask your health care provider.  Sleep with the head of your bed raised. Use a wedge under the mattress or blocks under the bed frame to raise the head of the bed. What foods are not recommended? The items listed may not be a complete list. Talk with your dietitian about what dietary choices are best for you. Grains Pastries or quick breads with added fat. [place] toast. Vegetables Deep fried vegetables. [place] fries. Any vegetables prepared with added fat. Any vegetables that cause symptoms. For some people this may include tomatoes and tomato products, chili peppers, onions and garlic, and horseradish. Fruits Any fruits prepared with added fat. Any fruits that cause symptoms. For some people this may include citrus fruits, such as oranges, grapefruit, pineapple, and lemons. Meats and other protein foods High-fat meats, such as fatty beef or pork, hot dogs, ribs,  ham, sausage, salami and bacon. Fried meat or protein, including fried fish and fried chicken. Nuts and nut butters. Dairy Whole milk and chocolate milk. Sour cream. Cream. Ice cream. Cream cheese. Milk shakes. Beverages Coffee and tea, with or without caffeine. Carbonated beverages. Sodas. Energy drinks. Fruit juice made with acidic fruits (such as orange or grapefruit). Tomato juice.  Alcoholic drinks. Fats and oils Butter. Margarine. Shortening. Ghee. Sweets and desserts Chocolate and cocoa. Donuts. Seasoning and other foods Pepper. Peppermint and spearmint. Any condiments, herbs, or seasonings that cause symptoms. For some people, this may include curry, hot sauce, or vinegar-based salad dressings. Summary  When you have gastroesophageal reflux disease (GERD), food and lifestyle choices are very important to help ease the discomfort of GERD.  Eat frequent, small meals instead of three large meals each day. Eat your meals slowly, in a relaxed setting. Avoid bending over or lying down until 2-3 hours after eating.  Limit high-fat foods such as fatty meat or fried foods. This information is not intended to replace advice given to you by your health care provider. Make sure you discuss any questions you have with your health care provider. Document Released: 04/30/2005 Document Revised: 05/01/2016 Document Reviewed: 05/01/2016 Elsevier Interactive Patient Education  [employer].

## 2017-08-14 NOTE — Assessment & Plan Note (Addendum)
Chronic GERD, currently not on a PPI but has taken Prilosec intermittently OTC in the past. Multiple OTC agents tried including Mylanta, Tums, Zantac to no avail. No dysphagia. Chronic nausea. As of note, he also reports significant fatigue and abdominal pain with gluten products. No anemia on recent CBC. Check celiac serologies now. Start Protonix once daily. Possible EGD at time of colonoscopy if positive celiac serologies or persistent dyspepsia despite PPI. GERD diet provided.

## 2017-08-14 NOTE — Progress Notes (Addendum)
REVIEWED-NO ADDITIONAL RECOMMENDATIONS.  Primary Care Physician:  Jacquelin Hawking, PA-C Primary Gastroenterologist:  Dr. Darrick Penna   Chief Complaint  Patient presents with  . Crohn's Disease    abd pain occas.     HPI:   Brendan Ochoa is a 42 y.o. male presenting today at the request of Jacquelin Hawking, [place], due to history of Crohn's disease. From what I gather, he was originally established with Dr. Elnoria Howard in 2008. He reports a colonoscopy and possibly EGD at that time. Reportedly small intestine involvement per his mom, who is present with him today. Started on prednisone and Asacol. Had presented with weight loss, abdominal pain nocturnally, associated rectal bleeding and diarrhea prior to endoscopic evaluation. Improved significantly with prednisone and Asacol. He has been off this for quite some time, and he states he only took this for a years. +  Has intermittent abdominal pain. If eats "bad foods" like sugar, grease, red meat, will have symptoms of lower abdominal pain and associated diarrhea. Baseline for patient is Bristol stool scale #6, varies in frequency. If having a flare, can be every hour. Postprandial component. Had low-volume hematochezia recently. When getting in a bad flare, will back off of his diet, which helps. Usually flares occurring at least 2-3 times per month. Weight fluctuates. Sometimes nausea with flares. Last few years having more nausea. Sometimes nausea even without flares. Has severe GERD. Mylanta, Tums. Zantac daily. Feels fatigued. Has significant fatigue and abdominal pain with gluten.   +smoker, occasional NSAIDs.   Past Medical History:  Diagnosis Date  . Asthma   . Cocaine abuse (HCC)    in past  . Crohn disease (HCC) 2008  . Depression    in past, not current as of 2019   . ETOH abuse    history of alcohol use    Past Surgical History:  Procedure Laterality Date  . FINGER AMPUTATION Right 2012   cut finger table saw and surgically  amputated    Current Outpatient Medications  Medication Sig Dispense Refill  . B Complex Vitamins (B-COMPLEX/B-12 SL) Place 2 tablets under the tongue 2 (two) times daily.     . ferrous sulfate 325 (65 FE) MG tablet Take 325 mg by mouth as needed.     Marland Kitchen glucose 4 GM chewable tablet Chew 1 tablet by mouth as needed for low blood sugar.    . Probiotic Product (PROBIOTIC PO) Take 1 tablet by mouth daily.    . ranitidine (ZANTAC) 150 MG capsule Take 150 mg by mouth daily as needed for heartburn.     No current facility-administered medications for this visit.     Allergies as of 08/14/2017  . (No Known Allergies)    Family History  Problem Relation Age of Onset  . Stomach cancer Maternal Grandfather   . Colon cancer Neg Hx     Social History   Socioeconomic History  . Marital status: Single    Spouse name: Not on file  . Number of children: Not on file  . Years of education: Not on file  . Highest education level: Not on file  Occupational History  . Not on file  Social Needs  . Financial resource strain: Not on file  . Food insecurity:    Worry: Not on file    Inability: Not on file  . Transportation needs:    Medical: Not on file    Non-medical: Not on file  Tobacco Use  . Smoking status: Current Every Day Smoker  Packs/day: 0.25    Years: 15.00    Pack years: 3.75    Types: Cigarettes  . Smokeless tobacco: Never Used  . Tobacco comment: 1/4 to 1/2 pack per day   Substance and Sexual Activity  . Alcohol use: No    Frequency: Never    Comment: none in past 6 months 08/14/17  . Drug use: No    Types: Cocaine    Comment: none since july 2016  . Sexual activity: Not on file  Lifestyle  . Physical activity:    Days per week: Not on file    Minutes per session: Not on file  . Stress: Not on file  Relationships  . Social connections:    Talks on phone: Not on file    Gets together: Not on file    Attends religious service: Not on file    Active member of  club or organization: Not on file    Attends meetings of clubs or organizations: Not on file    Relationship status: Not on file  . Intimate partner violence:    Fear of current or ex partner: Not on file    Emotionally abused: Not on file    Physically abused: Not on file    Forced sexual activity: Not on file  Other Topics Concern  . Not on file  Social History Narrative  . Not on file    Review of Systems: Gen: see HPI  CV: Denies chest pain, heart palpitations, peripheral edema, syncope.  Resp: Denies shortness of breath at rest or with exertion. Denies wheezing or cough.  GI: see HPI  GU : Denies urinary burning, urinary frequency, urinary hesitancy MS: Denies joint pain, muscle weakness, cramps, or limitation of movement.  Derm: Denies rash, itching, dry skin Psych: Denies depression, anxiety, memory loss, and confusion Heme: see HPI   Physical Exam: BP 113/71   Pulse 94   Temp (!) 97.4 F (36.3 C) (Oral)   Ht 5\' 11"  (1.803 m)   Wt 166 lb 6.4 oz (75.5 kg)   BMI 23.21 kg/m  General:   Alert and oriented. Pleasant and cooperative. Well-nourished and well-developed.  Head:  Normocephalic and atraumatic. Eyes:  Without icterus, sclera clear and conjunctiva pink.  Ears:  Normal auditory acuity. Nose:  No deformity, discharge,  or lesions. Mouth:  No deformity or lesions, oral mucosa pink.  Lungs:  Clear to auscultation bilaterally. No wheezes, rales, or rhonchi. No distress.  Heart:  S1, S2 present without murmurs appreciated.  Abdomen:  +BS, soft, non-tender and non-distended. No HSM noted. No guarding or rebound. No masses appreciated.  Rectal:  Deferred  Msk:  Symmetrical without gross deformities. Normal posture. Extremities:  Without  edema. Neurologic:  Alert and  oriented x4 Psych:  Alert and cooperative. Normal mood and affect.  Lab Results  Component Value Date   WBC 5.7 04/19/2017   HGB 16.6 04/19/2017   HCT 49.6 04/19/2017   MCV 90.7 04/19/2017   PLT  236 04/19/2017

## 2017-08-14 NOTE — Progress Notes (Signed)
cc'd to PCP

## 2017-08-15 ENCOUNTER — Telehealth: Payer: Self-pay | Admitting: *Deleted

## 2017-08-15 NOTE — Telephone Encounter (Signed)
Pre-op scheduled for 10/09/17 at 11:00am. Letter mailed. LMOVM

## 2017-09-04 ENCOUNTER — Encounter: Payer: Self-pay | Attending: Physician Assistant | Admitting: Nutrition

## 2017-09-04 ENCOUNTER — Encounter: Payer: Self-pay | Admitting: Nutrition

## 2017-09-04 VITALS — Ht 71.0 in | Wt 172.0 lb

## 2017-09-04 DIAGNOSIS — K5010 Crohn's disease of large intestine without complications: Secondary | ICD-10-CM

## 2017-09-04 NOTE — Progress Notes (Signed)
  Medical Nutrition Therapy:  Appt start time: 1030 end time:  1100.   Assessment:  Primary concerns today: Crohns. Lives with his parents.   Gained 6 lbs. He has been following the recommendations discussed last vist on diet for Crohns  75% of the time.  He notes he can tell a big difference in how his body is tolerating the food better. He feels much better. Has more energy. Less bloating, diarrhea and stomach pains.  He can tell when he eats stuff he shouldnt and has the GI effects afterwards. Going to get a colonoscopy in June. Sees [facility]. Working on a lower fat lower fiber diet with less processed foods.  Preferred Learning Style:   Auditory  Visual  Hands on  No preference indicated   Learning Readiness:  Ready  Change in progress   MEDICATIONS:    DIETARY INTAKE:    Eating three meals per day and few snacks. Less Gluten foods and foods high in fat and fiber.  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  Estimated energy needs: 2200  calories 248 g carbohydrates 165 g protein 56 grams g fat  Progress Towards Goal(s):  In progress.   Nutritional Diagnosis:  NI-5.11.2 Predicted excessive nutrient intake As related to Crohns.  As evidenced by chronic diarrhea, abdominal pain, bloating .    Intervention: Nutrition Therapy  For Crohns Disease Foods to avoid and foods that are recommended. Meal planning.    Goals 1. Follow Nutrition Guidelines for Crohn's disease. 2. Avoid high fat, high fiber and concentrated sweets 3. Follow Gluten Free diet 4. Increase lower carb vegetables and fruits. 5. Drink only water 6. Keep a food journal of foods and symptoms to find foods that irritate GI system. Gain 2 lbs per month Eat small frequent meals-5-6 times per day.   Teaching Method Utilized: Visual Auditory Hands on  Handouts given during visit include:  The Plate Method  Nutrition Therapy for Crohn's Disease  Barriers to  learning/adherence to lifestyle change:   Demonstrated degree of understanding via:  Teach Back   Monitoring/Evaluation:  Dietary intake, exercise, meal planning, and body weight PRN

## 2017-09-04 NOTE — Patient Instructions (Signed)
Goals Keep up the great job!! Continue to follow guidelines for Chron's disease. Gain 2-3 lbs per month.

## 2017-09-05 LAB — TISSUE TRANSGLUTAMINASE, IGA: (TTG) AB, IGA: 1 U/mL

## 2017-09-05 LAB — IGA: Immunoglobulin A: 198 mg/dL (ref 81–463)

## 2017-09-10 ENCOUNTER — Telehealth: Payer: Self-pay | Admitting: Gastroenterology

## 2017-09-10 NOTE — Progress Notes (Signed)
Negative celiac serologies. Colonoscopy as planned.

## 2017-09-10 NOTE — Telephone Encounter (Signed)
I've requested again. Are they not in Epic?

## 2017-09-10 NOTE — Telephone Encounter (Signed)
Unfortunately  not

## 2017-09-10 NOTE — Progress Notes (Signed)
LMOM to call.

## 2017-09-10 NOTE — Telephone Encounter (Signed)
Brendan Ochoa: can we request again the procedure notes and path from 2008 by Dr. Benson Norway? Colonoscopy performed then. Possible EGD may be on file.

## 2017-09-12 NOTE — Progress Notes (Signed)
Mailed letter with results and plan.

## 2017-09-25 ENCOUNTER — Ambulatory Visit: Payer: Self-pay | Admitting: Physician Assistant

## 2017-09-25 ENCOUNTER — Encounter: Payer: Self-pay | Admitting: Physician Assistant

## 2017-09-25 VITALS — BP 110/60 | HR 79 | Temp 98.1°F | Ht 71.0 in | Wt 168.2 lb

## 2017-09-25 DIAGNOSIS — F1011 Alcohol abuse, in remission: Secondary | ICD-10-CM

## 2017-09-25 DIAGNOSIS — K5090 Crohn's disease, unspecified, without complications: Secondary | ICD-10-CM

## 2017-09-25 DIAGNOSIS — F17210 Nicotine dependence, cigarettes, uncomplicated: Secondary | ICD-10-CM

## 2017-09-25 NOTE — Progress Notes (Signed)
BP 110/60 (BP Location: Left Arm, Patient Position: Sitting, Cuff Size: Normal)   Pulse 79   Temp 98.1 F (36.7 C)   Ht 5\' 11"  (1.803 m)   Wt 168 lb 4 oz (76.3 kg)   SpO2 98%   BMI 23.47 kg/m    Subjective:    Patient ID: Brendan Ochoa, male    DOB: 1975-07-07, 42 y.o.   MRN: [id number]  HPI: Brendan Ochoa is a 42 y.o. male presenting on 09/25/2017 for Follow-up   HPI   Pt is scheduled for colonoscopy in June for his Crohn's  Pt didn't turn his charity care application in until about 2 weeks ago  Pt is not drinking.  He is smoking a little bit.    Relevant past medical, surgical, family and social history reviewed and updated as indicated. Interim medical history since our last visit reviewed. Allergies and medications reviewed and updated.   Current Outpatient Medications:  .  B Complex Vitamins (B-COMPLEX/B-12 SL), Place 2 tablets under the tongue 2 (two) times daily. , Disp: , Rfl:  .  ferrous sulfate 325 (65 FE) MG tablet, Take 325 mg by mouth as needed. , Disp: , Rfl:  .  glucose 4 GM chewable tablet, Chew 1 tablet by mouth as needed for low blood sugar., Disp: , Rfl:  .  pantoprazole (PROTONIX) 40 MG tablet, Take 1 tablet (40 mg total) by mouth daily. 30 minutes before breakfast, Disp: 30 tablet, Rfl: 3 .  polyethylene glycol-electrolytes (TRILYTE) 420 g solution, Take 4,000 mLs by mouth as directed., Disp: 4000 mL, Rfl: 0 .  Probiotic Product (PROBIOTIC PO), Take 1 tablet by mouth daily., Disp: , Rfl:   Review of Systems  Constitutional: Positive for fatigue. Negative for appetite change, chills, diaphoresis, fever and unexpected weight change.  HENT: Negative for congestion, dental problem, drooling, ear pain, facial swelling, hearing loss, mouth sores, sneezing, sore throat, trouble swallowing and voice change.   Eyes: Negative for pain, discharge, redness, itching and visual disturbance.  Respiratory: Negative for cough, choking, shortness of breath and  wheezing.   Cardiovascular: Negative for chest pain, palpitations and leg swelling.  Gastrointestinal: Positive for abdominal pain and diarrhea. Negative for blood in stool, constipation and vomiting.  Endocrine: Negative for cold intolerance, heat intolerance and polydipsia.  Genitourinary: Negative for decreased urine volume, dysuria and hematuria.  Musculoskeletal: Negative for arthralgias, back pain and gait problem.  Skin: Negative for rash.  Allergic/Immunologic: Negative for environmental allergies.  Neurological: Negative for seizures, syncope, light-headedness and headaches.  Hematological: Negative for adenopathy.  Psychiatric/Behavioral: Negative for agitation, dysphoric mood and suicidal ideas. The patient is not nervous/anxious.     Per HPI unless specifically indicated above     Objective:    BP 110/60 (BP Location: Left Arm, Patient Position: Sitting, Cuff Size: Normal)   Pulse 79   Temp 98.1 F (36.7 C)   Ht 5\' 11"  (1.803 m)   Wt 168 lb 4 oz (76.3 kg)   SpO2 98%   BMI 23.47 kg/m   Wt Readings from Last 3 Encounters:  09/25/17 168 lb 4 oz (76.3 kg)  09/04/17 172 lb (78 kg)  08/14/17 166 lb 6.4 oz (75.5 kg)    Physical Exam  Constitutional: He is oriented to person, place, and time. He appears well-developed and well-nourished.  HENT:  Head: Normocephalic and atraumatic.  Neck: Neck supple.  Cardiovascular: Normal rate and regular rhythm.  Pulmonary/Chest: Effort normal and breath sounds normal. He has  no wheezes.  Abdominal: Soft. Bowel sounds are normal. He exhibits no distension and no mass. There is no hepatosplenomegaly. There is generalized tenderness (mild). There is no rebound and no guarding.  Musculoskeletal: He exhibits no edema.  Lymphadenopathy:    He has no cervical adenopathy.  Neurological: He is alert and oriented to person, place, and time.  Skin: Skin is warm and dry.  Psychiatric: He has a normal mood and affect. His behavior is normal.   Vitals reviewed.       Assessment & Plan:   Encounter Diagnoses  Name Primary?  . Crohn's disease without complication, unspecified gastrointestinal tract location (HCC) Yes  . Alcohol abuse, in remission   . Cigarette nicotine dependence, uncomplicated     -pt to Continue with GI -pt to Follow up here 6 months. RTO sooner prn

## 2017-10-08 NOTE — Telephone Encounter (Signed)
Still have not received records from procedure and path in 2008 by Dr. Elnoria Howard (colonoscopy and ?EGD).

## 2017-10-08 NOTE — Patient Instructions (Signed)
Brendan Ochoa  10/08/2017     @PREFPERIOPPHARMACY @   Your procedure is scheduled on 10/15/2017.  Report to [facility] at 6:15 A.M.  Call this number if you have problems the morning of surgery:  [phone]   Remember: Do not eat or drink after midnight.  Follow any and all instructions given to you by Dr Evelina Dun office.     Take these medicines the morning of surgery with A SIP OF WATER Protonix    Do not wear jewelry, make-up or nail polish.  Do not wear lotions, powders, or perfumes, or deodorant.  Do not shave 48 hours prior to surgery.  Men may shave face and neck.  Do not bring valuables to the hospital.  [facility] is not responsible for any belongings or valuables.  Contacts, dentures or bridgework may not be worn into surgery.  Leave your suitcase in the car.  After surgery it may be brought to your room.  For patients admitted to the hospital, discharge time will be determined by your treatment team.  Patients discharged the day of surgery will not be allowed to drive home.    Please read over the following fact sheets that you were given. Anesthesia Post-op Instructions     PATIENT INSTRUCTIONS POST-ANESTHESIA  IMMEDIATELY FOLLOWING SURGERY:  Do not drive or operate machinery for the first twenty four hours after surgery.  Do not make any important decisions for twenty four hours after surgery or while taking narcotic pain medications or sedatives.  If you develop intractable nausea and vomiting or a severe headache please notify your doctor immediately.  FOLLOW-UP:  Please make an appointment with your surgeon as instructed. You do not need to follow up with anesthesia unless specifically instructed to do so.  WOUND CARE INSTRUCTIONS (if applicable):  Keep a dry clean dressing on the anesthesia/puncture wound site if there is drainage.  Once the wound has quit draining you may leave it open to air.  Generally you should leave the bandage intact for twenty  four hours unless there is drainage.  If the epidural site drains for more than 36-48 hours please call the anesthesia department.  QUESTIONS?:  Please feel free to call your physician or the hospital operator if you have any questions, and they will be happy to assist you.      Esophagogastroduodenoscopy Esophagogastroduodenoscopy (EGD) is a procedure to examine the lining of the esophagus, stomach, and first part of the small intestine (duodenum). This procedure is done to check for problems such as inflammation, bleeding, ulcers, or growths. During this procedure, a long, flexible, lighted tube with a camera attached (endoscope) is inserted down the throat. Tell a health care provider about:  Any allergies you have.  All medicines you are taking, including vitamins, herbs, eye drops, creams, and over-the-counter medicines.  Any problems you or family members have had with anesthetic medicines.  Any blood disorders you have.  Any surgeries you have had.  Any medical conditions you have.  Whether you are pregnant or may be pregnant. What are the risks? Generally, this is a safe procedure. However, problems may occur, including:  Infection.  Bleeding.  A tear (perforation) in the esophagus, stomach, or duodenum.  Trouble breathing.  Excessive sweating.  Spasms of the larynx.  A slowed heartbeat.  Low blood pressure.  What happens before the procedure?  Follow instructions from your health care provider about eating or drinking restrictions.  Ask your health care provider about: ? Changing  or stopping your regular medicines. This is especially important if you are taking diabetes medicines or blood thinners. ? Taking medicines such as aspirin and ibuprofen. These medicines can thin your blood. Do not take these medicines before your procedure if your health care provider instructs you not to.  Plan to have someone take you home after the procedure.  If you wear  dentures, be ready to remove them before the procedure. What happens during the procedure?  To reduce your risk of infection, your health care team will wash or sanitize their hands.  An IV tube will be put in a vein in your hand or arm. You will get medicines and fluids through this tube.  You will be given one or more of the following: ? A medicine to help you relax (sedative). ? A medicine to numb the area (local anesthetic). This medicine may be sprayed into your throat. It will make you feel more comfortable and keep you from gagging or coughing during the procedure. ? A medicine for pain.  A mouth guard may be placed in your mouth to protect your teeth and to keep you from biting on the endoscope.  You will be asked to lie on your left side.  The endoscope will be lowered down your throat into your esophagus, stomach, and duodenum.  Air will be put into the endoscope. This will help your health care provider see better.  The lining of your esophagus, stomach, and duodenum will be examined.  Your health care provider may: ? Take a tissue sample so it can be looked at in a lab (biopsy). ? Remove growths. ? Remove objects (foreign bodies) that are stuck. ? Treat any bleeding with medicines or other devices that stop tissue from bleeding. ? Widen (dilate) or stretch narrowed areas of your esophagus and stomach.  The endoscope will be taken out. The procedure may vary among health care providers and hospitals. What happens after the procedure?  Your blood pressure, heart rate, breathing rate, and blood oxygen level will be monitored often until the medicines you were given have worn off.  Do not eat or drink anything until the numbing medicine has worn off and your gag reflex has returned. This information is not intended to replace advice given to you by your health care provider. Make sure you discuss any questions you have with your health care provider. Document Released:  08/31/2004 Document Revised: 10/06/2015 Document Reviewed: 03/24/2015 Elsevier Interactive Patient Education  2018 [employer]. Colonoscopy, Adult A colonoscopy is an exam to look at the entire large intestine. During the exam, a lubricated, bendable tube is inserted into the anus and then passed into the rectum, colon, and other parts of the large intestine. A colonoscopy is often done as a part of normal colorectal screening or in response to certain symptoms, such as anemia, persistent diarrhea, abdominal pain, and blood in the stool. The exam can help screen for and diagnose medical problems, including:  Tumors.  Polyps.  Inflammation.  Areas of bleeding.  Tell a health care provider about:  Any allergies you have.  All medicines you are taking, including vitamins, herbs, eye drops, creams, and over-the-counter medicines.  Any problems you or family members have had with anesthetic medicines.  Any blood disorders you have.  Any surgeries you have had.  Any medical conditions you have.  Any problems you have had passing stool. What are the risks? Generally, this is a safe procedure. However, problems may occur, including:  Bleeding.  A tear in the intestine.  A reaction to medicines given during the exam.  Infection (rare).  What happens before the procedure? Eating and drinking restrictions Follow instructions from your health care provider about eating and drinking, which may include:  A few days before the procedure - follow a low-fiber diet. Avoid nuts, seeds, dried fruit, raw fruits, and vegetables.  1-3 days before the procedure - follow a clear liquid diet. Drink only clear liquids, such as clear broth or bouillon, black coffee or tea, clear juice, clear soft drinks or sports drinks, gelatin dessert, and popsicles. Avoid any liquids that contain red or purple dye.  On the day of the procedure - do not eat or drink anything during the 2 hours before the  procedure, or within the time period that your health care provider recommends.  Bowel prep If you were prescribed an oral bowel prep to clean out your colon:  Take it as told by your health care provider. Starting the day before your procedure, you will need to drink a large amount of medicated liquid. The liquid will cause you to have multiple loose stools until your stool is almost clear or light green.  If your skin or anus gets irritated from diarrhea, you may use these to relieve the irritation: ? Medicated wipes, such as adult wet wipes with aloe and vitamin E. ? A skin soothing-product like petroleum jelly.  If you vomit while drinking the bowel prep, take a break for up to 60 minutes and then begin the bowel prep again. If vomiting continues and you cannot take the bowel prep without vomiting, call your health care provider.  General instructions  Ask your health care provider about changing or stopping your regular medicines. This is especially important if you are taking diabetes medicines or blood thinners.  Plan to have someone take you home from the hospital or clinic. What happens during the procedure?  An IV tube may be inserted into one of your veins.  You will be given medicine to help you relax (sedative).  To reduce your risk of infection: ? Your health care team will wash or sanitize their hands. ? Your anal area will be washed with soap.  You will be asked to lie on your side with your knees bent.  Your health care provider will lubricate a long, thin, flexible tube. The tube will have a camera and a light on the end.  The tube will be inserted into your anus.  The tube will be gently eased through your rectum and colon.  Air will be delivered into your colon to keep it open. You may feel some pressure or cramping.  The camera will be used to take images during the procedure.  A small tissue sample may be removed from your body to be examined under a  microscope (biopsy). If any potential problems are found, the tissue will be sent to a lab for testing.  If small polyps are found, your health care provider may remove them and have them checked for cancer cells.  The tube that was inserted into your anus will be slowly removed. The procedure may vary among health care providers and hospitals. What happens after the procedure?  Your blood pressure, heart rate, breathing rate, and blood oxygen level will be monitored until the medicines you were given have worn off.  Do not drive for 24 hours after the exam.  You may have a small amount of blood in your  stool.  You may pass gas and have mild abdominal cramping or bloating due to the air that was used to inflate your colon during the exam.  It is up to you to get the results of your procedure. Ask your health care provider, or the department performing the procedure, when your results will be ready. This information is not intended to replace advice given to you by your health care provider. Make sure you discuss any questions you have with your health care provider. Document Released: 04/27/2000 Document Revised: 02/29/2016 Document Reviewed: 07/12/2015 Elsevier Interactive Patient Education  2018 [employer].

## 2017-10-09 ENCOUNTER — Encounter (HOSPITAL_COMMUNITY): Payer: Self-pay

## 2017-10-09 ENCOUNTER — Encounter (HOSPITAL_COMMUNITY)
Admission: RE | Admit: 2017-10-09 | Discharge: 2017-10-09 | Disposition: A | Discharge status: Home / Self Care | Payer: Self-pay | Source: Ambulatory Visit | Attending: Gastroenterology | Admitting: Gastroenterology

## 2017-10-09 ENCOUNTER — Other Ambulatory Visit: Payer: Self-pay

## 2017-10-09 DIAGNOSIS — Z01812 Encounter for preprocedural laboratory examination: Secondary | ICD-10-CM | POA: Insufficient documentation

## 2017-10-09 HISTORY — DX: Gastro-esophageal reflux disease without esophagitis: K21.9

## 2017-10-09 LAB — RAPID URINE DRUG SCREEN, HOSP PERFORMED
AMPHETAMINES: NOT DETECTED
BENZODIAZEPINES: NOT DETECTED
Barbiturates: NOT DETECTED
Cocaine: NOT DETECTED
Opiates: NOT DETECTED
TETRAHYDROCANNABINOL: NOT DETECTED

## 2017-10-09 LAB — CBC
HEMATOCRIT: 45.1 % (ref 39.0–52.0)
HEMOGLOBIN: 15.4 g/dL (ref 13.0–17.0)
MCH: 30.6 pg (ref 26.0–34.0)
MCHC: 34.1 g/dL (ref 30.0–36.0)
MCV: 89.5 fL (ref 78.0–100.0)
Platelets: 212 10*3/uL (ref 150–400)
RBC: 5.04 MIL/uL (ref 4.22–5.81)
RDW: 13 % (ref 11.5–15.5)
WBC: 5.8 10*3/uL (ref 4.0–10.5)

## 2017-10-09 LAB — BASIC METABOLIC PANEL
Anion gap: 8 (ref 5–15)
BUN: 16 mg/dL (ref 6–20)
CHLORIDE: 104 mmol/L (ref 101–111)
CO2: 23 mmol/L (ref 22–32)
CREATININE: 0.86 mg/dL (ref 0.61–1.24)
Calcium: 9.1 mg/dL (ref 8.9–10.3)
GFR calc non Af Amer: >60 mL/min (ref >60)
Glucose, Bld: 120 mg/dL — ABNORMAL HIGH (ref 65–99)
POTASSIUM: 4 mmol/L (ref 3.5–5.1)
Sodium: 135 mmol/L (ref 135–145)

## 2017-10-09 NOTE — Telephone Encounter (Signed)
I have requested again

## 2017-10-15 ENCOUNTER — Ambulatory Visit (HOSPITAL_COMMUNITY): Payer: Self-pay | Admitting: Anesthesiology

## 2017-10-15 ENCOUNTER — Ambulatory Visit (HOSPITAL_COMMUNITY)
Admission: RE | Admit: 2017-10-15 | Discharge: 2017-10-15 | Disposition: A | Discharge status: Home / Self Care | Payer: Self-pay | Source: Ambulatory Visit | Attending: Gastroenterology | Admitting: Gastroenterology

## 2017-10-15 ENCOUNTER — Encounter (HOSPITAL_COMMUNITY): Payer: Self-pay | Admitting: *Deleted

## 2017-10-15 ENCOUNTER — Encounter (HOSPITAL_COMMUNITY)
Admission: RE | Disposition: A | Discharge status: Home / Self Care | Payer: Self-pay | Source: Ambulatory Visit | Attending: Gastroenterology

## 2017-10-15 DIAGNOSIS — F1421 Cocaine dependence, in remission: Secondary | ICD-10-CM | POA: Insufficient documentation

## 2017-10-15 DIAGNOSIS — K648 Other hemorrhoids: Secondary | ICD-10-CM | POA: Insufficient documentation

## 2017-10-15 DIAGNOSIS — K2970 Gastritis, unspecified, without bleeding: Secondary | ICD-10-CM | POA: Insufficient documentation

## 2017-10-15 DIAGNOSIS — K5090 Crohn's disease, unspecified, without complications: Secondary | ICD-10-CM | POA: Insufficient documentation

## 2017-10-15 DIAGNOSIS — F329 Major depressive disorder, single episode, unspecified: Secondary | ICD-10-CM | POA: Insufficient documentation

## 2017-10-15 DIAGNOSIS — K2980 Duodenitis without bleeding: Secondary | ICD-10-CM | POA: Insufficient documentation

## 2017-10-15 DIAGNOSIS — K449 Diaphragmatic hernia without obstruction or gangrene: Secondary | ICD-10-CM | POA: Insufficient documentation

## 2017-10-15 DIAGNOSIS — R109 Unspecified abdominal pain: Secondary | ICD-10-CM

## 2017-10-15 DIAGNOSIS — K219 Gastro-esophageal reflux disease without esophagitis: Secondary | ICD-10-CM | POA: Insufficient documentation

## 2017-10-15 DIAGNOSIS — Z79899 Other long term (current) drug therapy: Secondary | ICD-10-CM | POA: Insufficient documentation

## 2017-10-15 DIAGNOSIS — K644 Residual hemorrhoidal skin tags: Secondary | ICD-10-CM | POA: Insufficient documentation

## 2017-10-15 DIAGNOSIS — Q438 Other specified congenital malformations of intestine: Secondary | ICD-10-CM | POA: Insufficient documentation

## 2017-10-15 DIAGNOSIS — Z800 Family history of malignant neoplasm of digestive organs: Secondary | ICD-10-CM | POA: Insufficient documentation

## 2017-10-15 DIAGNOSIS — J45909 Unspecified asthma, uncomplicated: Secondary | ICD-10-CM | POA: Insufficient documentation

## 2017-10-15 DIAGNOSIS — F17210 Nicotine dependence, cigarettes, uncomplicated: Secondary | ICD-10-CM | POA: Insufficient documentation

## 2017-10-15 DIAGNOSIS — R197 Diarrhea, unspecified: Secondary | ICD-10-CM

## 2017-10-15 HISTORY — PX: BIOPSY: SHX5522

## 2017-10-15 HISTORY — PX: ESOPHAGOGASTRODUODENOSCOPY (EGD) WITH PROPOFOL: SHX5813

## 2017-10-15 HISTORY — PX: COLONOSCOPY WITH PROPOFOL: SHX5780

## 2017-10-15 LAB — RAPID URINE DRUG SCREEN, HOSP PERFORMED
Amphetamines: NOT DETECTED
BARBITURATES: NOT DETECTED
BENZODIAZEPINES: NOT DETECTED
Cocaine: NOT DETECTED
OPIATES: NOT DETECTED
TETRAHYDROCANNABINOL: NOT DETECTED

## 2017-10-15 SURGERY — COLONOSCOPY WITH PROPOFOL
Anesthesia: Monitor Anesthesia Care

## 2017-10-15 MED ORDER — CHLORHEXIDINE GLUCONATE CLOTH 2 % EX PADS: 6.0000 | MEDICATED_PAD | Freq: Once | CUTANEOUS | Status: DC

## 2017-10-15 MED ORDER — MIDAZOLAM HCL 5 MG/5ML IJ SOLN
INTRAMUSCULAR | Status: DC | PRN
  Administered 2017-10-15: 2 mg via INTRAVENOUS

## 2017-10-15 MED ORDER — PROPOFOL 10 MG/ML IV BOLUS
INTRAVENOUS | Status: AC
  Filled 2017-10-15: qty 40

## 2017-10-15 MED ORDER — PROPOFOL 500 MG/50ML IV EMUL
INTRAVENOUS | Status: DC | PRN
  Administered 2017-10-15 (×2): via INTRAVENOUS
  Administered 2017-10-15: 175 ug/kg/min via INTRAVENOUS
  Administered 2017-10-15: 08:00:00 via INTRAVENOUS

## 2017-10-15 MED ORDER — MIDAZOLAM HCL 2 MG/2ML IJ SOLN
INTRAMUSCULAR | Status: AC
  Filled 2017-10-15: qty 2

## 2017-10-15 MED ORDER — PROPOFOL 10 MG/ML IV BOLUS
INTRAVENOUS | Status: DC | PRN
  Administered 2017-10-15 (×6): 20 mg via INTRAVENOUS

## 2017-10-15 MED ORDER — LACTATED RINGERS IV SOLN
INTRAVENOUS | Status: DC
  Administered 2017-10-15: 1000 mL via INTRAVENOUS
  Administered 2017-10-15: 08:00:00 via INTRAVENOUS

## 2017-10-15 NOTE — Anesthesia Postprocedure Evaluation (Signed)
Anesthesia Post Note  Patient: Brendan Ochoa  Procedure(s) Performed: COLONOSCOPY WITH PROPOFOL (N/A ) ESOPHAGOGASTRODUODENOSCOPY (EGD) WITH PROPOFOL (N/A ) BIOPSY  Patient location during evaluation: PACU Anesthesia Type: MAC Level of consciousness: awake and alert and oriented Pain management: pain level controlled Vital Signs Assessment: post-procedure vital signs reviewed and stable Respiratory status: spontaneous breathing Cardiovascular status: stable and blood pressure returned to baseline Postop Assessment: no apparent nausea or vomiting Anesthetic complications: no     Last Vitals:  Vitals:   10/15/17 0700 10/15/17 0715  BP: 124/76 121/81  Resp: 17 (!) 21  Temp:    SpO2: 95% 94%    Last Pain:  Vitals:   10/15/17 0732  TempSrc:   PainSc: 3                  Versia Mignogna

## 2017-10-15 NOTE — Transfer of Care (Signed)
Immediate Anesthesia Transfer of Care Note  Patient: Brendan Ochoa  Procedure(s) Performed: COLONOSCOPY WITH PROPOFOL (N/A ) ESOPHAGOGASTRODUODENOSCOPY (EGD) WITH PROPOFOL (N/A ) BIOPSY  Patient Location: PACU  Anesthesia Type:MAC  Level of Consciousness: awake, alert  and oriented  Airway & Oxygen Therapy: Patient Spontanous Breathing  Post-op Assessment: Report given to RN  Post vital signs: Reviewed and stable  Last Vitals:  Vitals Value Taken Time  BP 142/101 10/15/2017  8:25 AM  Temp    Pulse 65 10/15/2017  8:28 AM  Resp 17 10/15/2017  8:28 AM  SpO2 97 % 10/15/2017  8:28 AM  Vitals shown include unvalidated device data.  Last Pain:  Vitals:   10/15/17 0732  TempSrc:   PainSc: 3       Patients Stated Pain Goal: 8 (26/37/85 8850)  Complications: No apparent anesthesia complications

## 2017-10-15 NOTE — Op Note (Signed)
[facility] Patient Name: Brendan Ochoa Procedure Date: 10/15/2017 7:15 AM MRN: [id number] Date of Birth: [birth year] Attending MD: Barney Drain MD, MD CSN: [id number] Age: 42 Admit Type: Outpatient Procedure:                Upper GI endoscopy with cold forceps biopsy Indications:              Dyspepsia,PMHx: GERD/ETOH ABUSE Providers:                Barney Drain MD, MD, Otis Peak B. Sharon Seller, RN, Aram Candela Referring MD:             Soyla Dryer PA-C Medicines:                Propofol per Anesthesia Complications:            No immediate complications. Estimated Blood Loss:     Estimated blood loss was minimal. Procedure:                Pre-Anesthesia Assessment:                           - Prior to the procedure, a History and Physical                            was performed, and patient medications and                            allergies were reviewed. The patient's tolerance of                            previous anesthesia was also reviewed. The risks                            and benefits of the procedure and the sedation                            options and risks were discussed with the patient.                            All questions were answered, and informed consent                            was obtained. Prior Anticoagulants: The patient has                            taken no previous anticoagulant or antiplatelet                            agents. ASA Grade Assessment: I - A normal, healthy                            patient. After reviewing the risks and benefits,  the patient was deemed in satisfactory condition to                            undergo the procedure. After obtaining informed                            consent, the endoscope was passed under direct                            vision. Throughout the procedure, the patient's                            blood pressure, pulse, and oxygen  saturations were                            monitored continuously. The [id number] ([id number])                            scope was introduced through the mouth, and                            advanced to the second part of duodenum. The upper                            GI endoscopy was accomplished without difficulty.                            The patient tolerated the procedure fairly well. Scope In: 8:16:37 AM Scope Out: 8:24:42 AM Total Procedure Duration: 0 hours 8 minutes 5 seconds  Findings:      The examined esophagus was normal.      A small hiatal hernia was present.      Diffuse mild inflammation characterized by congestion (edema) and       erythema was found in the gastric antrum. Biopsies were taken with a       cold forceps for histology.      Localized mild inflammation characterized by congestion (edema) and       erythema was found in the duodenal bulb. This was biopsied with a cold       forceps for histology.      The second portion of the duodenum was normal. Biopsies were taken with       a cold forceps for histology. Impression:               - Small hiatal hernia.                           - DYSPEPSIA DUE TO GERD/MILD                            Gastritis/DUODENITIS/Duodenitis. Biopsied. Moderate Sedation:      Per Anesthesia Care Recommendation:           - Resume previous diet.                           - Continue present medications.                           -  Await pathology results.                           - Return to my office in 4 months.                           - Patient has a contact number available for                            emergencies. The signs and symptoms of potential                            delayed complications were discussed with the                            patient. Return to normal activities tomorrow.                            Written discharge instructions were provided to the                            patient. Procedure  Code(s):        --- Professional ---                           [phone], Esophagogastroduodenoscopy, flexible,                            transoral; with biopsy, single or multiple Diagnosis Code(s):        --- Professional ---                           K44.9, Diaphragmatic hernia without obstruction or                            gangrene                           K29.70, Gastritis, unspecified, without bleeding                           K29.80, Duodenitis without bleeding                           K30, Functional dyspepsia CPT copyright 2017 [employer]. All rights reserved. The codes documented in this report are preliminary and upon coder review may  be revised to meet current compliance requirements. Barney Drain, MD Barney Drain MD, MD 10/15/2017 10:09:26 AM This report has been signed electronically. Number of Addenda: 0

## 2017-10-15 NOTE — H&P (Signed)
Primary Care Physician:  Jacquelin Hawking, PA-C Primary Gastroenterologist:  Dr. Darrick Penna  Pre-Procedure History & Physical: HPI:  Brendan Ochoa is a 42 y.o. male here for ABDOMINAL  PAIN/DIARRHEA/dyspepsia.  Past Medical History:  Diagnosis Date  . Asthma   . Cocaine abuse (HCC)    in past  . Crohn disease (HCC) 2008  . Depression    in past, not current as of 2019   . ETOH abuse    history of alcohol use  . GERD (gastroesophageal reflux disease)     Past Surgical History:  Procedure Laterality Date  . FINGER AMPUTATION Right 2012   cut finger table saw and surgically amputated    Prior to Admission medications   Medication Sig Start Date End Date Taking? Authorizing Provider  acetaminophen (TYLENOL) 500 MG tablet Take 1,000 mg by mouth every 6 (six) hours as needed for headache.   Yes [provider]  Cyanocobalamin (VITAMIN B-12) 5000 MCG SUBL Place 10,000 mcg under the tongue daily.   Yes [provider]  diphenhydrAMINE (BENADRYL) 25 mg capsule Take 25 mg by mouth daily as needed (allergies).   Yes [provider]  ferrous sulfate 325 (65 FE) MG tablet Take 325 mg by mouth daily as needed (energy).    Yes [provider]  glucose 4 GM chewable tablet Chew 1 tablet by mouth as needed for low blood sugar.   Yes [provider]  Multiple Vitamin (MULTIVITAMIN WITH MINERALS) TABS tablet Take 1 tablet by mouth daily.   Yes [provider]  pantoprazole (PROTONIX) 40 MG tablet Take 1 tablet (40 mg total) by mouth daily. 30 minutes before breakfast 08/14/17  Yes Gelene Mink, NP  polyethylene glycol-electrolytes (TRILYTE) 420 g solution Take 4,000 mLs by mouth as directed. 08/14/17  Yes Lluvia Gwynne, Darleene Cleaver, MD  Probiotic Product (PROBIOTIC PO) Take 1 tablet by mouth daily.   Yes [provider]    Allergies as of 08/14/2017  . (No Known Allergies)    Family History  Problem Relation Age of Onset  . Stomach cancer  Maternal Grandfather   . Colon cancer Neg Hx     Social History   Socioeconomic History  . Marital status: Single    Spouse name: Not on file  . Number of children: Not on file  . Years of education: Not on file  . Highest education level: Not on file  Occupational History  . Not on file  Social Needs  . Financial resource strain: Not on file  . Food insecurity:    Worry: Not on file    Inability: Not on file  . Transportation needs:    Medical: Not on file    Non-medical: Not on file  Tobacco Use  . Smoking status: Current Every Day Smoker    Packs/day: 0.25    Years: 15.00    Pack years: 3.75    Types: Cigarettes  . Smokeless tobacco: Never Used  . Tobacco comment: 1/4 to 1/2 pack per day   Substance and Sexual Activity  . Alcohol use: No    Frequency: Never    Comment: none in past 6 months 08/14/17  . Drug use: No    Types: Cocaine    Comment: 3 months ago  . Sexual activity: Not on file  Lifestyle  . Physical activity:    Days per week: Not on file    Minutes per session: Not on file  . Stress: Not on file  Relationships  .  Social connections:    Talks on phone: Not on file    Gets together: Not on file    Attends religious service: Not on file    Active member of club or organization: Not on file    Attends meetings of clubs or organizations: Not on file    Relationship status: Not on file  . Intimate partner violence:    Fear of current or ex partner: Not on file    Emotionally abused: Not on file    Physically abused: Not on file    Forced sexual activity: Not on file  Other Topics Concern  . Not on file  Social History Narrative  . Not on file    Review of Systems: See HPI, otherwise negative ROS   Physical Exam: BP 123/82   Temp 98 F (36.7 C) (Oral)   Resp 12   SpO2 94%  General:   Alert,  pleasant and cooperative in NAD Head:  Normocephalic and atraumatic. Neck:  Supple; Lungs:  Clear throughout to auscultation.    Heart:  Regular  rate and rhythm. Abdomen:  Soft, nontender and nondistended. Normal bowel sounds, without guarding, and without rebound.   Neurologic:  Alert and  oriented x4;  grossly normal neurologically.  Impression/Plan:     ABDOMINAL  PAIN/DIARRHEA/dyspepsia  PLAN: 1. TCS/EGD TODAY-CONSIDER BIOPSY COLON, STOMACH, & DUODENUM. DISCUSSED PROCEDURE, BENEFITS, & RISKS: < 1% chance of medication reaction, bleeding, perforation, or rupture of spleen/liver.

## 2017-10-15 NOTE — Op Note (Signed)
[facility] Patient Name: Brendan Ochoa Procedure Date: 10/15/2017 7:15 AM MRN: [id number] Date of Birth: [birth year] Attending MD: Barney Drain MD, MD CSN: [id number] Age: 42 Admit Type: Outpatient Procedure:                Colonoscopy WITH COLD FORCEPS BIOPSY Indications:              Abdominal pain, Diarrhea Providers:                Barney Drain MD, MD, Otis Peak B. Sharon Seller, RN, Aram Candela Referring MD:             Soyla Dryer PA-C Medicines:                Propofol per Anesthesia Complications:            No immediate complications. Estimated Blood Loss:     Estimated blood loss was minimal. Procedure:                Pre-Anesthesia Assessment:                           - Prior to the procedure, a History and Physical                            was performed, and patient medications and                            allergies were reviewed. The patient's tolerance of                            previous anesthesia was also reviewed. The risks                            and benefits of the procedure and the sedation                            options and risks were discussed with the patient.                            All questions were answered, and informed consent                            was obtained. Prior Anticoagulants: The patient has                            taken no previous anticoagulant or antiplatelet                            agents. ASA Grade Assessment: I - A normal, healthy                            patient. After reviewing the risks and benefits,  the patient was deemed in satisfactory condition to                            undergo the procedure. After obtaining informed                            consent, the colonoscope was passed under direct                            vision. Throughout the procedure, the patient's                            blood pressure, pulse, and oxygen saturations were                           monitored continuously. The [id number] ([id number])                            scope was introduced through the anus and advanced                            to the 15 cm into the ileum. The colonoscopy was                            somewhat difficult due to a tortuous colon.                            Successful completion of the procedure was aided by                            straightening and shortening the scope to obtain                            bowel loop reduction and COLOWRAP. The patient                            tolerated the procedure well. The quality of the                            bowel preparation was excellent. The terminal                            ileum, ileocecal valve, appendiceal orifice, and                            rectum were photographed. Scope In: 7:45:26 AM Scope Out: 8:10:15 AM Scope Withdrawal Time: 0 hours 22 minutes 9 seconds  Total Procedure Duration: 0 hours 24 minutes 49 seconds  Findings:      Patchy inflammation, mild in severity and characterized by congestion       (edema) and erythema was found in the terminal ileum. Biopsies were       taken with a cold forceps for histology.      The recto-sigmoid colon and sigmoid colon were mildly redundant.  Background biopsies were taken for histology with a cold forceps from       the entire colon. These biopsy specimens from the cecum, ascending       colon, transverse colon, descending colon and sigmoid colon were sent to       Pathology.      The rectum appeared normal. This was biopsied with a cold forceps for       evaluation of inflammatory bowel disease. Coagulation for hemostasis of       bleeding caused by the procedure using bipolar probe was successful.      External and internal hemorrhoids were found. The hemorrhoids were       moderate. Impression:               - Quiescent Crohn's disease. Biopsied.                           - Redundant colon.                            - The rectum is normal. Biopsied.                           - External and internal hemorrhoids. Moderate Sedation:      Per Anesthesia Care Recommendation:           - Resume previous diet.                           - Continue present medications.                           - Await pathology results.                           - Return to my office in 4 months.                           - Patient has a contact number available for                            emergencies. The signs and symptoms of potential                            delayed complications were discussed with the                            patient. Return to normal activities tomorrow.                            Written discharge instructions were provided to the                            patient. Procedure Code(s):        --- Professional ---                           [phone], Colonoscopy, flexible; with biopsy, single  or multiple Diagnosis Code(s):        --- Professional ---                           K50.90, Crohn's disease, unspecified, without                            complications                           K64.8, Other hemorrhoids                           R10.9, Unspecified abdominal pain                           R19.7, Diarrhea, unspecified                           Q43.8, Other specified congenital malformations of                            intestine CPT copyright 2017 American Medical Association. All rights reserved. The codes documented in this report are preliminary and upon coder review may  be revised to meet current compliance requirements. Barney Drain, MD Barney Drain MD, MD 10/15/2017 8:38:44 AM This report has been signed electronically. Number of Addenda: 0

## 2017-10-15 NOTE — Anesthesia Preprocedure Evaluation (Signed)
Anesthesia Evaluation  Patient identified by MRN, date of birth, ID band Patient awake    Reviewed: Allergy & Precautions, H&P , NPO status , Patient's Chart, lab work & pertinent test results  Airway Mallampati: II  TM Distance: >3 FB Neck ROM: full    Dental no notable dental hx.    Pulmonary neg pulmonary ROS, asthma , Current Smoker,    Pulmonary exam normal breath sounds clear to auscultation       Cardiovascular Exercise Tolerance: Good negative cardio ROS Normal cardiovascular exam Rhythm:regular Rate:Normal     Neuro/Psych negative neurological ROS  negative psych ROS   GI/Hepatic negative GI ROS, Neg liver ROS, GERD  ,Crohn's disease (HCC)   Endo/Other  negative endocrine ROS  Renal/GU negative Renal ROS  negative genitourinary   Musculoskeletal   Abdominal   Peds  Hematology negative hematology ROS (+)   Anesthesia Other Findings   Reproductive/Obstetrics negative OB ROS                             Anesthesia Physical Anesthesia Plan  ASA: II  Anesthesia Plan: MAC   Post-op Pain Management:    Induction:   PONV Risk Score and Plan:   Airway Management Planned:   Additional Equipment:   Intra-op Plan:   Post-operative Plan:   Informed Consent: I have reviewed the patients History and Physical, chart, labs and discussed the procedure including the risks, benefits and alternatives for the proposed anesthesia with the patient or authorized representative who has indicated his/her understanding and acceptance.   Dental Advisory Given  Plan Discussed with: CRNA  Anesthesia Plan Comments:         Anesthesia Quick Evaluation

## 2017-10-23 ENCOUNTER — Encounter (HOSPITAL_COMMUNITY): Payer: Self-pay | Admitting: Gastroenterology

## 2017-10-24 ENCOUNTER — Telehealth: Payer: Self-pay | Admitting: Gastroenterology

## 2017-10-24 NOTE — Discharge Instructions (Signed)
THE LAST PART OF YOUR SMALL BOWEL showed very mlid inflammation. I DID NOT SEE DEFINITE EVIDENCE FOR ACTIVE CROHN'S DISEASE. No source for your abdominal pain/diarrhea was identified. You have moderate internal and external hemorrhoids. You have gastritis and a small hiatal hernia. I biopsied your stomach, small bowel, and colon.    DRINK WATER TO KEEP YOUR URINE LIGHT YELLOW.  FOLLOW A HIGH FIBER/LOW FAT DIET. AVOID ITEMS THAT CAUSE BLOATING. SEE INFO BELOW.   STOP TAKING IRON.  CONTINUE PROTONIX. TAKE 30 MINUTES PRIOR TO BREAKFAST.   YOUR BIOPSY WILL BE BACK IN 7 DAYS.  FOLLOW UP IN 4 MOS.   Next colonoscopy in 10 years.   ENDOSCOPY Care After Read the instructions outlined below and refer to this sheet in the next week. These discharge instructions provide you with general information on caring for yourself after you leave the hospital. While your treatment has been planned according to the most current medical practices available, unavoidable complications occasionally occur. If you have any problems or questions after discharge, call DR. Aloni Chuang, [phone].  ACTIVITY  You may resume your regular activity, but move at a slower pace for the next 24 hours.   Take frequent rest periods for the next 24 hours.   Walking will help get rid of the air and reduce the bloated feeling in your belly (abdomen).   No driving for 24 hours (because of the medicine (anesthesia) used during the test).   You may shower.   Do not sign any important legal documents or operate any machinery for 24 hours (because of the anesthesia used during the test).    NUTRITION  Drink plenty of fluids.   You may resume your normal diet as instructed by your doctor.   Begin with a light meal and progress to your normal diet. Heavy or fried foods are harder to digest and may make you feel sick to your stomach (nauseated).   Avoid alcoholic beverages for 24 hours or as instructed.     MEDICATIONS  You may resume your normal medications.   WHAT YOU CAN EXPECT TODAY  Some feelings of bloating in the abdomen.   Passage of more gas than usual.   Spotting of blood in your stool or on the toilet paper  .  IF YOU HAD POLYPS REMOVED DURING THE ENDOSCOPY:  Eat a soft diet IF YOU HAVE NAUSEA, BLOATING, ABDOMINAL PAIN, OR VOMITING.    FINDING OUT THE RESULTS OF YOUR TEST Not all test results are available during your visit. DR. Darrick Penna WILL CALL YOU WITHIN 14 DAYS OF YOUR PROCEDUE WITH YOUR RESULTS. Do not assume everything is normal if you have not heard from DR. Kyrielle Urbanski IN TWO WEEKS, CALL HER OFFICE AT [phone].  SEEK IMMEDIATE MEDICAL ATTENTION AND CALL THE OFFICE: [phone] IF:  You have more than a spotting of blood in your stool.   Your belly is swollen (abdominal distention).   You are nauseated or vomiting.   You have a temperature over 101F.   You have abdominal pain or discomfort that is severe or gets worse throughout the day.   Gastritis  Gastritis is an inflammation (the body's way of reacting to injury and/or infection) of the stomach. It is often caused by viral or bacterial (germ) infections. It can also be caused BY ALCOHOL, ASPIRIN, BC/GOODY POWDER'S, (IBUPROFEN) MOTRIN, OR ALEVE (NAPROXEN), chemicals (including alcohol), SPICY FOODS, and medications. This illness may be associated with generalized malaise (feeling tired, not well), UPPER ABDOMINAL STOMACH cramps,  and fever. One common bacterial cause of gastritis is an organism known as H. Pylori. This can be treated with antibiotics.    High-Fiber Diet A high-fiber diet changes your normal diet to include more whole grains, legumes, fruits, and vegetables. Changes in the diet involve replacing refined carbohydrates with unrefined foods. The calorie level of the diet is essentially unchanged. The Dietary Reference Intake (recommended amount) for adult males is 38 grams per day. For adult  females, it is 25 grams per day. Pregnant and lactating women should consume 28 grams of fiber per day. Fiber is the intact part of a plant that is not broken down during digestion. Functional fiber is fiber that has been isolated from the plant to provide a beneficial effect in the body.  PURPOSE  Increase stool bulk.   Ease and regulate bowel movements.   Lower cholesterol.   REDUCE RISK OF COLON CANCER  INDICATIONS THAT YOU NEED MORE FIBER  Constipation and hemorrhoids.   Uncomplicated diverticulosis (intestine condition) and irritable bowel syndrome.   Weight management.   As a protective measure against hardening of the arteries (atherosclerosis), diabetes, and cancer.   GUIDELINES FOR INCREASING FIBER IN THE DIET  Start adding fiber to the diet slowly. A gradual increase of about 5 more grams (2 slices of whole-wheat bread, 2 servings of most fruits or vegetables, or 1 bowl of high-fiber cereal) per day is best. Too rapid an increase in fiber may result in constipation, flatulence, and bloating.   Drink enough water and fluids to keep your urine clear or pale yellow. Water, juice, or caffeine-free drinks are recommended. Not drinking enough fluid may cause constipation.   Eat a variety of high-fiber foods rather than one type of fiber.   Try to increase your intake of fiber through using high-fiber foods rather than fiber pills or supplements that contain small amounts of fiber.   The goal is to change the types of food eaten. Do not supplement your present diet with high-fiber foods, but replace foods in your present diet.   INCLUDE A VARIETY OF FIBER SOURCES  Replace refined and processed grains with whole grains, canned fruits with fresh fruits, and incorporate other fiber sources. White rice, white breads, and most bakery goods contain little or no fiber.   Brown whole-grain rice, buckwheat oats, and many fruits and vegetables are all good sources of fiber. These  include: broccoli, Brussels sprouts, cabbage, cauliflower, beets, sweet potatoes, white potatoes (skin on), carrots, tomatoes, eggplant, squash, berries, fresh fruits, and dried fruits.   Cereals appear to be the richest source of fiber. Cereal fiber is found in whole grains and bran. Bran is the fiber-rich outer coat of cereal grain, which is largely removed in refining. In whole-grain cereals, the bran remains. In breakfast cereals, the largest amount of fiber is found in those with "bran" in their names. The fiber content is sometimes indicated on the label.   You may need to include additional fruits and vegetables each day.   In baking, for 1 cup white flour, you may use the following substitutions:   1 cup whole-wheat flour minus 2 tablespoons.   1/2 cup white flour plus 1/2 cup whole-wheat flour.   Low-Fat Diet BREADS, CEREALS, PASTA, RICE, DRIED PEAS, AND BEANS These products are high in carbohydrates and most are low in fat. Therefore, they can be increased in the diet as substitutes for fatty foods. They too, however, contain calories and should not be eaten in excess. Cereals  can be eaten for snacks as well as for breakfast.  Include foods that contain fiber (fruits, vegetables, whole grains, and legumes). Research shows that fiber may lower blood cholesterol levels, especially the water-soluble fiber found in fruits, vegetables, oat products, and legumes. FRUITS AND VEGETABLES It is good to eat fruits and vegetables. Besides being sources of fiber, both are rich in vitamins and some minerals. They help you get the daily allowances of these nutrients. Fruits and vegetables can be used for snacks and desserts. MEATS Limit lean meat, chicken, [place], and fish to no more than 6 ounces per day. Beef, Pork, and Lamb Use lean cuts of beef, pork, and lamb. Lean cuts include:  Extra-lean ground beef.  Arm roast.  Sirloin tip.  Center-cut ham.  Round steak.  Loin chops.  Rump roast.   Tenderloin.  Trim all fat off the outside of meats before cooking. It is not necessary to severely decrease the intake of red meat, but lean choices should be made. Lean meat is rich in protein and contains a highly absorbable form of iron. Premenopausal women, in particular, should avoid reducing lean red meat because this could increase the risk for low red blood cells (iron-deficiency anemia). The organ meats, such as liver, sweetbreads, kidneys, and brain are very rich in cholesterol. They should be limited. Chicken and [place] These are good sources of protein. The fat of poultry can be reduced by removing the skin and underlying fat layers before cooking. Chicken and [place] can be substituted for lean red meat in the diet. Poultry should not be fried or covered with high-fat sauces. Fish and Shellfish Fish is a good source of protein. Shellfish contain cholesterol, but they usually are low in saturated fatty acids. The preparation of fish is important. Like chicken and [place], they should not be fried or covered with high-fat sauces. EGGS Egg whites contain no fat or cholesterol. They can be eaten often. Try 1 to 2 egg whites instead of whole eggs in recipes or use egg substitutes that do not contain yolk. MILK AND DAIRY PRODUCTS Use skim or 1% milk instead of 2% or whole milk. Decrease whole milk, natural, and processed cheeses. Use nonfat or low-fat (2%) cottage cheese or low-fat cheeses made from vegetable oils. Choose nonfat or low-fat (1 to 2%) yogurt. Experiment with evaporated skim milk in recipes that call for heavy cream. Substitute low-fat yogurt or low-fat cottage cheese for sour cream in dips and salad dressings. Have at least 2 servings of low-fat dairy products, such as 2 glasses of skim (or 1%) milk each day to help get your daily calcium intake.  FATS AND OILS Reduce the total intake of fats, especially saturated fat. Butterfat, lard, and beef fats are high in saturated fat and  cholesterol. These should be avoided as much as possible. Vegetable fats do not contain cholesterol, but certain vegetable fats, such as coconut oil, palm oil, and palm kernel oil are very high in saturated fats. These should be limited. These fats are often used in bakery goods, processed foods, popcorn, oils, and nondairy creamers. Vegetable shortenings and some peanut butters contain hydrogenated oils, which are also saturated fats. Read the labels on these foods and check for saturated vegetable oils. Unsaturated vegetable oils and fats do not raise blood cholesterol. However, they should be limited because they are fats and are high in calories. Total fat should still be limited to 30% of your daily caloric intake. Desirable liquid vegetable oils are corn oil,  cottonseed oil, olive oil, canola oil, safflower oil, soybean oil, and sunflower oil. Peanut oil is not as good, but small amounts are acceptable. Buy a heart-healthy tub margarine that has no partially hydrogenated oils in the ingredients. Mayonnaise and salad dressings often are made from unsaturated fats, but they should also be limited because of their high calorie and fat content. Seeds, nuts, peanut butter, olives, and avocados are high in fat, but the fat is mainly the unsaturated type. These foods should be limited mainly to avoid excess calories and fat. OTHER EATING TIPS Snacks  Most sweets should be limited as snacks. They tend to be rich in calories and fats, and their caloric content outweighs their nutritional value. Some good choices in snacks are graham crackers, melba toast, soda crackers, bagels (no egg), English muffins, fruits, and vegetables. These snacks are preferable to snack crackers, [place] fries, and chips. Popcorn should be air-popped or cooked in small amounts of liquid vegetable oil. Desserts Eat fruit, low-fat yogurt, and fruit ices. AVOID pastries, cake, and cookies. Sherbet, angel food cake, gelatin dessert, frozen  low-fat yogurt, or other frozen products that do not contain saturated fat (pure fruit juice bars, frozen ice pops) are also acceptable.  COOKING METHODS Choose those methods that use little or no fat. They include: Poaching.  Braising.  Steaming.  Grilling.  Baking.  Stir-frying.  Broiling.  Microwaving.  Foods can be cooked in a nonstick pan without added fat, or use a nonfat cooking spray in regular cookware. Limit fried foods and avoid frying in saturated fat. Add moisture to lean meats by using water, broth, cooking wines, and other nonfat or low-fat sauces along with the cooking methods mentioned above. Soups and stews should be chilled after cooking. The fat that forms on top after a few hours in the refrigerator should be skimmed off. When preparing meals, avoid using excess salt. Salt can contribute to raising blood pressure in some people. EATING AWAY FROM HOME Order entres, potatoes, and vegetables without sauces or butter. When meat exceeds the size of a deck of cards (3 to 4 ounces), the rest can be taken home for another meal. Choose vegetable or fruit salads and ask for low-calorie salad dressings to be served on the side. Use dressings sparingly. Limit high-fat toppings, such as bacon, crumbled eggs, cheese, sunflower seeds, and olives. Ask for heart-healthy tub margarine instead of butter.    Hemorrhoids Hemorrhoids are dilated (enlarged) veins around the rectum. Sometimes clots will form in the veins. This makes them swollen and painful. These are called thrombosed hemorrhoids. Causes of hemorrhoids include:  Constipation.   Straining to have a bowel movement.   HEAVY LIFTING HOME CARE INSTRUCTIONS  Eat a well balanced diet and drink 6 to 8 glasses of water every day to avoid constipation. You may also use a bulk laxative.   Avoid straining to have bowel movements.   Keep anal area dry and clean.   Do not use a donut shaped pillow or sit on the toilet for long  periods. This increases blood pooling and pain.   Move your bowels when your body has the urge; this will require less straining and will decrease pain and pressure.     PATIENT INSTRUCTIONS POST-ANESTHESIA  IMMEDIATELY FOLLOWING SURGERY:  Do not drive or operate machinery for the first twenty four hours after surgery.  Do not make any important decisions for twenty four hours after surgery or while taking narcotic pain medications or sedatives.  If you  develop intractable nausea and vomiting or a severe headache please notify your doctor immediately.  FOLLOW-UP:  Please make an appointment with your surgeon as instructed. You do not need to follow up with anesthesia unless specifically instructed to do so.  WOUND CARE INSTRUCTIONS (if applicable):  Keep a dry clean dressing on the anesthesia/puncture wound site if there is drainage.  Once the wound has quit draining you may leave it open to air.  Generally you should leave the bandage intact for twenty four hours unless there is drainage.  If the epidural site drains for more than 36-48 hours please call the anesthesia department.  QUESTIONS?:  Please feel free to call your physician or the hospital operator if you have any questions, and they will be happy to assist you.

## 2017-10-24 NOTE — Telephone Encounter (Signed)
LMOM to call.

## 2017-10-24 NOTE — Telephone Encounter (Signed)
PLEASE CALL PT. HIS SMALL BOWEL BIOPSIES SHOW MILD INFLAMMATION BUT NO DEFINITE EVIDENCE CROHN'S DISEASE. HIS STOMACH, SMALL BOWEL, COLON, AND RECTUM BIOPSIES ARE NORMAL. HE DOES NOT NEED TREATMENT FOR CROHN'S DISEASE AT THIS TIME.  HIS INTERMITTENT ABDOMINAL PAIN IS MOST LIKELY DUE TO IBS OR FOOD INTOLERANCE. USE IMODIUM IF NEEDED TO CONTROL DIARRHEA.   HE SHOULD STOP SMOKING WHICH MAKES CROHN'S FLARE MORE LIKELY TO HAPPEN.   DRINK WATER TO KEEP YOUR URINE LIGHT YELLOW.  FOLLOW A HIGH FIBER/LOW FAT DIET. AVOID ITEMS THAT CAUSE BLOATING.    STOP TAKING IRON.  CONTINUE PROTONIX. TAKE 30 MINUTES PRIOR TO BREAKFAST.   FOLLOW UP IN 4 MOS E30 DIARRHEA/ABDOMINALPAIN.   Next colonoscopy in 10 years.  PLEASE CALL WITH QUESTIONS OR CONCERNS.

## 2017-10-25 ENCOUNTER — Telehealth: Payer: Self-pay | Admitting: Gastroenterology

## 2017-10-25 NOTE — Telephone Encounter (Signed)
Results given to pt's mother.

## 2017-10-25 NOTE — Telephone Encounter (Signed)
PATIENT SCHEDULED AND ON RECALL

## 2017-10-25 NOTE — Telephone Encounter (Signed)
PATIENT CALLED FOR RESULTS, PLEASE CALL BACK

## 2017-10-25 NOTE — Telephone Encounter (Signed)
Pts mother notified of results. Pt is set up for his f/u appointment.

## 2017-11-04 ENCOUNTER — Ambulatory Visit: Payer: Self-pay | Admitting: Gastroenterology

## 2018-02-18 ENCOUNTER — Ambulatory Visit (INDEPENDENT_AMBULATORY_CARE_PROVIDER_SITE_OTHER): Payer: Self-pay | Admitting: Gastroenterology

## 2018-02-18 ENCOUNTER — Encounter: Payer: Self-pay | Admitting: Gastroenterology

## 2018-02-18 DIAGNOSIS — K529 Noninfective gastroenteritis and colitis, unspecified: Secondary | ICD-10-CM

## 2018-02-18 NOTE — Progress Notes (Signed)
Referring Provider: Jacquelin Hawking, PA-C Primary Care Physician:  Jacquelin Hawking, PA-C  Primary GI: Dr. Darrick Penna   Chief Complaint  Patient presents with  . Crohn's Disease    HPI:   Brendan Ochoa is a 41 y.o. male presenting today with a history of reported small bowel Crohn's. Previously followed by Dr. Elnoria Howard. Colonoscopy/EGD recently completed. EGD with gastritis, duodenitis. Colonoscopy with TI Biopsies: non-specific chronic inactive ileitis. Quiescent Crohn's. No need for medical therapy.    Brett Albino will cause abdominal pain. Denies reflux currently. Staying away from soft drinks. Not on Protonix currently. Wants to avoid. Cigarette pack will last 3 days. Overall feels better from before.   Past Medical History:  Diagnosis Date  . Asthma   . Cocaine abuse (HCC)    in past  . Crohn disease (HCC) 2008  . Depression    in past, not current as of 2019   . ETOH abuse    history of alcohol use  . GERD (gastroesophageal reflux disease)     Past Surgical History:  Procedure Laterality Date  . BIOPSY  10/15/2017   Procedure: BIOPSY;  Surgeon: West Bali, MD;  Location: [facility];  Service: Endoscopy;;  terminal ileum colon  . COLONOSCOPY WITH PROPOFOL N/A 10/15/2017   redundant colon, normal rectum, external and internal hemorrhoids, quiescent crohn's. Pathology: inactive chronicn nonspecific ileitis  . ESOPHAGOGASTRODUODENOSCOPY (EGD) WITH PROPOFOL N/A 10/15/2017   normal esophagus, small hiatal hernia, gastritis, duodenitis  . FINGER AMPUTATION Right 2012   cut finger table saw and surgically amputated    Current Outpatient Medications  Medication Sig Dispense Refill  . acetaminophen (TYLENOL) 500 MG tablet Take 1,000 mg by mouth every 6 (six) hours as needed for headache.    . Cyanocobalamin (VITAMIN B-12) 5000 MCG SUBL Place 10,000 mcg under the tongue as needed.     . diphenhydrAMINE (BENADRYL) 25 mg capsule Take 25 mg by mouth daily as needed  (allergies).    Marland Kitchen glucose 4 GM chewable tablet Chew 1 tablet by mouth as needed for low blood sugar.    . Probiotic Product (PROBIOTIC PO) Take 1 tablet by mouth daily.     No current facility-administered medications for this visit.     Allergies as of 02/18/2018  . (No Known Allergies)    Family History  Problem Relation Age of Onset  . Stomach cancer Maternal Grandfather   . Colon cancer Neg Hx     Social History   Socioeconomic History  . Marital status: Single    Spouse name: Not on file  . Number of children: Not on file  . Years of education: Not on file  . Highest education level: Not on file  Occupational History  . Not on file  Social Needs  . Financial resource strain: Not on file  . Food insecurity:    Worry: Not on file    Inability: Not on file  . Transportation needs:    Medical: Not on file    Non-medical: Not on file  Tobacco Use  . Smoking status: Current Every Day Smoker    Packs/day: 0.25    Years: 15.00    Pack years: 3.75    Types: Cigarettes  . Smokeless tobacco: Never Used  . Tobacco comment: 1/4 to 1/2 pack per day   Substance and Sexual Activity  . Alcohol use: No    Frequency: Never    Comment: none in past 6 months 08/14/17  .  Drug use: No    Types: Cocaine    Comment: 3 months ago  . Sexual activity: Not on file  Lifestyle  . Physical activity:    Days per week: Not on file    Minutes per session: Not on file  . Stress: Not on file  Relationships  . Social connections:    Talks on phone: Not on file    Gets together: Not on file    Attends religious service: Not on file    Active member of club or organization: Not on file    Attends meetings of clubs or organizations: Not on file    Relationship status: Not on file  Other Topics Concern  . Not on file  Social History Narrative  . Not on file    Review of Systems: Gen: Denies fever, chills, anorexia. Denies fatigue, weakness, weight loss.  CV: Denies chest pain,  palpitations, syncope, peripheral edema, and claudication. Resp: Denies dyspnea at rest, cough, wheezing, coughing up blood, and pleurisy. GI: see HPI  Derm: Denies rash, itching, dry skin Psych: Denies depression, anxiety, memory loss, confusion. No homicidal or suicidal ideation.  Heme: Denies bruising, bleeding, and enlarged lymph nodes.  Physical Exam: BP 113/67   Pulse 81   Temp 97.8 F (36.6 C) (Oral)   Ht 5\' 11"  (1.803 m)   Wt 172 lb 12.8 oz (78.4 kg)   BMI 24.10 kg/m  General:   Alert and oriented. No distress noted. Pleasant and cooperative.  Head:  Normocephalic and atraumatic. Eyes:  Conjuctiva clear without scleral icterus. Mouth:  Oral mucosa pink and moist.  Abdomen:  +BS, soft, non-tender and non-distended. No rebound or guarding. No HSM or masses noted. Msk:  Symmetrical without gross deformities. Normal posture. Extremities:  Without edema. Neurologic:  Alert and  oriented x4 Psych:  Alert and cooperative. Normal mood and affect.

## 2018-02-18 NOTE — Patient Instructions (Signed)
I am glad you are doing better! Call me if you have any recurrent pain or changes.  We will see you back in 1 year or sooner if needed! I recommend smoking cessation, avoidance of Ibuprofen, Advil, Aleve, etc.   I enjoyed seeing you again today! As you know, I value our relationship and want to provide genuine, compassionate, and quality care. I welcome your feedback. If you receive a survey regarding your visit,  I greatly appreciate you taking time to fill this out. See you next time!  Annitta Needs, PhD, ANP-BC [facility]

## 2018-02-19 ENCOUNTER — Encounter: Payer: Self-pay | Admitting: Gastroenterology

## 2018-02-19 NOTE — Assessment & Plan Note (Signed)
Quiescent Crohn's disease. Colonoscopy with TI biopsies: non-specific chronic inactive ileitis. Symptoms more related to dietary intake. Continue to recommend smoking cessation, NSAID avoidance. Call if abdominal pain, otherwise see in 1 year.

## 2018-02-19 NOTE — Progress Notes (Signed)
cc'ed to pcp °

## 2018-03-26 ENCOUNTER — Ambulatory Visit: Payer: Self-pay | Admitting: Physician Assistant

## 2018-04-16 ENCOUNTER — Encounter: Payer: Self-pay | Admitting: Physician Assistant

## 2018-07-29 ENCOUNTER — Emergency Department (HOSPITAL_COMMUNITY)
Admission: EM | Admit: 2018-07-29 | Discharge: 2018-07-30 | Disposition: A | Discharge status: Home / Self Care | Payer: Self-pay | Attending: Emergency Medicine | Admitting: Emergency Medicine

## 2018-07-29 ENCOUNTER — Encounter (HOSPITAL_COMMUNITY): Payer: Self-pay | Admitting: Emergency Medicine

## 2018-07-29 ENCOUNTER — Other Ambulatory Visit: Payer: Self-pay

## 2018-07-29 ENCOUNTER — Emergency Department (HOSPITAL_COMMUNITY): Payer: Self-pay

## 2018-07-29 DIAGNOSIS — B9789 Other viral agents as the cause of diseases classified elsewhere: Secondary | ICD-10-CM

## 2018-07-29 DIAGNOSIS — J069 Acute upper respiratory infection, unspecified: Secondary | ICD-10-CM | POA: Insufficient documentation

## 2018-07-29 DIAGNOSIS — J45909 Unspecified asthma, uncomplicated: Secondary | ICD-10-CM | POA: Insufficient documentation

## 2018-07-29 DIAGNOSIS — Z79899 Other long term (current) drug therapy: Secondary | ICD-10-CM | POA: Insufficient documentation

## 2018-07-29 DIAGNOSIS — F17210 Nicotine dependence, cigarettes, uncomplicated: Secondary | ICD-10-CM | POA: Insufficient documentation

## 2018-07-29 MED ORDER — IPRATROPIUM-ALBUTEROL 0.5-2.5 (3) MG/3ML IN SOLN
3.0000 mL | Freq: Once | RESPIRATORY_TRACT | Status: AC
  Administered 2018-07-30: 3 mL via RESPIRATORY_TRACT
  Filled 2018-07-29: qty 3

## 2018-07-29 NOTE — ED Provider Notes (Addendum)
[facility] EMERGENCY DEPARTMENT Provider Note   CSN: [id number] Arrival date & time: 07/29/18  2119    History   Chief Complaint Chief Complaint  Patient presents with  . Cough    HPI Brendan Ochoa is a 43 y.o. male.     Patient with history of tobacco abuse, asthma as a child, Crohn's disease in remission presenting with 3 days of difficulty breathing, cough and congestion.  States his cough is productive of clear and yellow mucus.  Has had nasal congestion and sore throat as well.  No documented fevers.  Has had body aches as well.  Multiple family members have been sick with similar symptoms.  Has not traveled outside the country or had any known exposures to someone with coronavirus.  States he does not take any breathing medications normally.  He states has not smoked for the past week.  He feels tight in his chest and has trouble getting a deep breath.  He started himself on some leftover amoxicillin that he has been taking for the past 2 days with the concern that he could have pneumonia.  He denies any chest pain or abdominal pain.  He denies any leg pain or leg swelling.  Denies any cardiac history.  The history is provided by the patient.  Cough  Associated symptoms: rhinorrhea, shortness of breath and sore throat   Associated symptoms: no chest pain, no fever, no headaches, no myalgias and no rash     Past Medical History:  Diagnosis Date  . Asthma   . Cocaine abuse (HCC)    in past  . Crohn disease (HCC) 2008  . Depression    in past, not current as of 2019   . ETOH abuse    history of alcohol use  . GERD (gastroesophageal reflux disease)     Patient Active Problem List   Diagnosis Date Noted  . Ileitis 02/18/2018  . HYPERGLYCEMIA 11/01/2009  . ABNORMAL WEIGHT GAIN 06/15/2009  . TOBACCO ABUSE 07/18/2007  . SUBSTANCE ABUSE, MULTIPLE 07/18/2007  . DEPRESSION 07/18/2007  . ALLERGIC RHINITIS 07/18/2007  . ASTHMA 07/18/2007  . GERD 07/18/2007  .  Crohn's disease (HCC) 07/18/2007    Past Surgical History:  Procedure Laterality Date  . BIOPSY  10/15/2017   Procedure: BIOPSY;  Surgeon: West Bali, MD;  [facility];  Service: Endoscopy;;  terminal ileum colon  . COLONOSCOPY WITH PROPOFOL N/A 10/15/2017   redundant colon, normal rectum, external and internal hemorrhoids, quiescent crohn's. Pathology: inactive chronicn nonspecific ileitis  . ESOPHAGOGASTRODUODENOSCOPY (EGD) WITH PROPOFOL N/A 10/15/2017   normal esophagus, small hiatal hernia, gastritis, duodenitis  . FINGER AMPUTATION Right 2012   cut finger table saw and surgically amputated        Home Medications    Prior to Admission medications   Medication Sig Start Date End Date Taking? Authorizing Provider  acetaminophen (TYLENOL) 500 MG tablet Take 1,000 mg by mouth every 6 (six) hours as needed for headache.    [provider]  Cyanocobalamin (VITAMIN B-12) 5000 MCG SUBL Place 10,000 mcg under the tongue as needed.     [provider]  diphenhydrAMINE (BENADRYL) 25 mg capsule Take 25 mg by mouth daily as needed (allergies).    [provider]  glucose 4 GM chewable tablet Chew 1 tablet by mouth as needed for low blood sugar.    [provider]  Probiotic Product (PROBIOTIC PO) Take 1 tablet by mouth daily.    [provider]    Family History Family History  Problem Relation Age of Onset  . Stomach cancer Maternal Grandfather   . Colon cancer Neg Hx     Social History Social History   Tobacco Use  . Smoking status: Current Every Day Smoker    Packs/day: 0.25    Years: 15.00    Pack years: 3.75    Types: Cigarettes  . Smokeless tobacco: Never Used  . Tobacco comment: 1/4 to 1/2 pack per day   Substance Use Topics  . Alcohol use: Yes    Frequency: Never    Comment: occasionally  . Drug use: Yes    Types: Cocaine    Comment: last use 2 days ago     Allergies   Patient has no known allergies.    Review of Systems Review of Systems  Constitutional: Negative for activity change, appetite change and fever.  HENT: Positive for congestion, rhinorrhea, sinus pressure and sore throat.   Eyes: Negative for photophobia and visual disturbance.  Respiratory: Positive for cough, chest tightness and shortness of breath.   Cardiovascular: Negative for chest pain.  Gastrointestinal: Negative for abdominal pain, nausea and vomiting.  Genitourinary: Negative for dysuria and hematuria.  Musculoskeletal: Negative for arthralgias, back pain and myalgias.  Skin: Negative for rash.  Neurological: Negative for dizziness, weakness and headaches.   all other systems are negative except as noted in the HPI and PMH.     Physical Exam Updated Vital Signs BP 127/85 (BP Location: Right Arm)   Pulse 78   Temp 98.3 F (36.8 C) (Oral)   Resp 20   Ht 5\' 11"  (1.803 m)   Wt 77.1 kg   SpO2 94%   BMI 23.71 kg/m   Physical Exam Vitals signs and nursing note reviewed.  Constitutional:      General: He is not in acute distress.    Appearance: He is well-developed.     Comments: Speaking in full sentences, nontoxic  HENT:     Head: Normocephalic and atraumatic.     Right Ear: Tympanic membrane normal.     Left Ear: Tympanic membrane normal.     Nose: Congestion and rhinorrhea present.     Mouth/Throat:     Mouth: Mucous membranes are moist.     Pharynx: No oropharyngeal exudate.  Eyes:     Conjunctiva/sclera: Conjunctivae normal.     Pupils: Pupils are equal, round, and reactive to light.  Neck:     Musculoskeletal: Normal range of motion and neck supple.     Comments: No meningismus. Cardiovascular:     Rate and Rhythm: Normal rate and regular rhythm.     Heart sounds: Normal heart sounds. No murmur.  Pulmonary:     Effort: Pulmonary effort is normal. No respiratory distress.     Breath sounds: Wheezing present.     Comments: Diminished breath sounds throughout with scattered expiratory  wheezing Abdominal:     Palpations: Abdomen is soft.     Tenderness: There is no abdominal tenderness. There is no guarding or rebound.  Musculoskeletal: Normal range of motion.        General: No tenderness.  Skin:    General: Skin is warm.     Capillary Refill: Capillary refill takes less than 2 seconds.  Neurological:     General: No focal deficit present.     Mental Status: He is alert and oriented to person, place, and time. Mental status is at baseline.  Cranial Nerves: No cranial nerve deficit.     Motor: No abnormal muscle tone.     Coordination: Coordination normal.     Comments: No ataxia on finger to nose bilaterally. No pronator drift. 5/5 strength throughout. CN 2-12 intact.Equal grip strength. Sensation intact.   Psychiatric:        Behavior: Behavior normal.      ED Treatments / Results  Labs (all labs ordered are listed, but only abnormal results are displayed) Labs Reviewed  INFLUENZA PANEL BY PCR (TYPE A & B)    EKG None  Radiology Dg Chest 2 View  Result Date: 07/29/2018 CLINICAL DATA:  The patient states cough with yellow phlegm x 3 days, he developed SOB and CP today. Hx of asthma, bronchitis, pneumonia, and current smoker. EXAM: CHEST - 2 VIEW COMPARISON:  12/12/2015 FINDINGS: Cardiac silhouette is normal in size. No mediastinal or hilar masses. No evidence of adenopathy. Lungs are clear. No pleural effusion or pneumothorax. Skeletal structures are intact. IMPRESSION: No active cardiopulmonary disease. Electronically Signed   By: Amie Portland M.D.   On: 07/29/2018 22:12    Procedures Procedures (including critical care time)  Medications Ordered in ED Medications  ipratropium-albuterol (DUONEB) 0.5-2.5 (3) MG/3ML nebulizer solution 3 mL (has no administration in time range)     Initial Impression / Assessment and Plan / ED Course  I have reviewed the triage vital signs and the nursing notes.  Pertinent labs & imaging results that were  available during my care of the patient were reviewed by me and considered in my medical decision making (see chart for details).       3 days of cough, congestion, shortness of breath, chest tightness and body aches.  Patient is nontoxic but has diminished breath sounds and some wheezing.  Chest x-ray in triage is clear.  Will give bronchodilators and swab for influenza.  He has no coronavirus risk factors.  Patient feels much improved after bronchodilators.  He is moving good air without wheezing.  His flu swab is negative.  Suspect viral illness with cough.  He does not have any risk factors for coronavirus.  He is not hypoxic or tachypneic.  He will given bronchodilators, antitussives and antibiotics because of his smoking history.  He is encouraged to stop smoking. Establish care with PCP.  Return precautions discussed.  Final Clinical Impressions(s) / ED Diagnoses   Final diagnoses:  Viral URI with cough    ED Discharge Orders    None       Lacole Komorowski, Jeannett Senior, MD 07/30/18 Brendan Ochoa    Glynn Octave, MD 07/30/18 [phone]

## 2018-07-29 NOTE — ED Triage Notes (Addendum)
Patient complaining of cough and congestion x 3 days. Also complaining of generalized body aches.

## 2018-07-30 LAB — INFLUENZA PANEL BY PCR (TYPE A & B)
Influenza A By PCR: NEGATIVE
Influenza B By PCR: NEGATIVE

## 2018-07-30 MED ORDER — DOXYCYCLINE HYCLATE 100 MG PO CAPS: 100.0000 mg | ORAL_CAPSULE | Freq: Two times a day (BID) | ORAL | 0 refills | Status: DC

## 2018-07-30 MED ORDER — ALBUTEROL SULFATE HFA 108 (90 BASE) MCG/ACT IN AERS
2.0000 | INHALATION_SPRAY | Freq: Four times a day (QID) | RESPIRATORY_TRACT | 0 refills | Status: DC | PRN
Start: 2018-07-30 — End: 2020-01-01

## 2018-07-30 MED ORDER — BENZONATATE 100 MG PO CAPS: 100.0000 mg | ORAL_CAPSULE | Freq: Three times a day (TID) | ORAL | 0 refills | Status: DC

## 2018-07-30 NOTE — Discharge Instructions (Addendum)
Stop smoking.  Use the inhaler every 4 hours as needed for difficulty breathing.  Take the antibiotics as prescribed and follow-up with your primary doctor.  Return to the ED with worsening symptoms including chest pain or shortness of breath or any other concerns.

## 2019-02-10 ENCOUNTER — Encounter: Payer: Self-pay | Admitting: Gastroenterology

## 2019-11-28 ENCOUNTER — Other Ambulatory Visit: Payer: Self-pay

## 2019-11-28 ENCOUNTER — Emergency Department (HOSPITAL_COMMUNITY)
Admission: EM | Admit: 2019-11-28 | Discharge: 2019-11-28 | Disposition: A | Discharge status: Home / Self Care | Payer: Self-pay | Attending: Emergency Medicine | Admitting: Emergency Medicine

## 2019-11-28 DIAGNOSIS — F1990 Other psychoactive substance use, unspecified, uncomplicated: Secondary | ICD-10-CM | POA: Insufficient documentation

## 2019-11-28 DIAGNOSIS — Z5321 Procedure and treatment not carried out due to patient leaving prior to being seen by health care provider: Secondary | ICD-10-CM | POA: Insufficient documentation

## 2020-01-01 ENCOUNTER — Encounter (HOSPITAL_COMMUNITY): Payer: Self-pay | Admitting: Emergency Medicine

## 2020-01-01 ENCOUNTER — Observation Stay (HOSPITAL_COMMUNITY)
Admission: EM | Admit: 2020-01-01 | Discharge: 2020-01-02 | Disposition: A | Discharge status: Home / Self Care | Payer: Self-pay | Attending: Internal Medicine | Admitting: Internal Medicine

## 2020-01-01 ENCOUNTER — Other Ambulatory Visit: Payer: Self-pay

## 2020-01-01 ENCOUNTER — Emergency Department (HOSPITAL_COMMUNITY): Payer: Self-pay

## 2020-01-01 ENCOUNTER — Observation Stay (HOSPITAL_COMMUNITY): Payer: Self-pay

## 2020-01-01 DIAGNOSIS — K219 Gastro-esophageal reflux disease without esophagitis: Secondary | ICD-10-CM | POA: Insufficient documentation

## 2020-01-01 DIAGNOSIS — Z79899 Other long term (current) drug therapy: Secondary | ICD-10-CM | POA: Insufficient documentation

## 2020-01-01 DIAGNOSIS — F1410 Cocaine abuse, uncomplicated: Secondary | ICD-10-CM | POA: Diagnosis present

## 2020-01-01 DIAGNOSIS — R000 Tachycardia, unspecified: Secondary | ICD-10-CM | POA: Insufficient documentation

## 2020-01-01 DIAGNOSIS — R1110 Vomiting, unspecified: Secondary | ICD-10-CM | POA: Diagnosis present

## 2020-01-01 DIAGNOSIS — F1010 Alcohol abuse, uncomplicated: Secondary | ICD-10-CM | POA: Diagnosis present

## 2020-01-01 DIAGNOSIS — F17210 Nicotine dependence, cigarettes, uncomplicated: Secondary | ICD-10-CM | POA: Insufficient documentation

## 2020-01-01 DIAGNOSIS — Z20822 Contact with and (suspected) exposure to covid-19: Secondary | ICD-10-CM | POA: Insufficient documentation

## 2020-01-01 DIAGNOSIS — K5090 Crohn's disease, unspecified, without complications: Secondary | ICD-10-CM | POA: Diagnosis present

## 2020-01-01 DIAGNOSIS — R1010 Upper abdominal pain, unspecified: Secondary | ICD-10-CM | POA: Insufficient documentation

## 2020-01-01 DIAGNOSIS — N179 Acute kidney failure, unspecified: Principal | ICD-10-CM | POA: Diagnosis present

## 2020-01-01 DIAGNOSIS — F1910 Other psychoactive substance abuse, uncomplicated: Secondary | ICD-10-CM | POA: Insufficient documentation

## 2020-01-01 DIAGNOSIS — J45909 Unspecified asthma, uncomplicated: Secondary | ICD-10-CM | POA: Insufficient documentation

## 2020-01-01 LAB — CBC WITH DIFFERENTIAL/PLATELET
Abs Immature Granulocytes: 0.03 10*3/uL (ref 0.00–0.07)
Basophils Absolute: 0.1 10*3/uL (ref 0.0–0.1)
Basophils Relative: 1 %
Eosinophils Absolute: 0 10*3/uL (ref 0.0–0.5)
Eosinophils Relative: 0 %
HCT: 49.9 % (ref 39.0–52.0)
Hemoglobin: 17.4 g/dL — ABNORMAL HIGH (ref 13.0–17.0)
Immature Granulocytes: 0 %
Lymphocytes Relative: 11 %
Lymphs Abs: 1.2 10*3/uL (ref 0.7–4.0)
MCH: 32.2 pg (ref 26.0–34.0)
MCHC: 34.9 g/dL (ref 30.0–36.0)
MCV: 92.2 fL (ref 80.0–100.0)
Monocytes Absolute: 1.1 10*3/uL — ABNORMAL HIGH (ref 0.1–1.0)
Monocytes Relative: 10 %
Neutro Abs: 8.3 10*3/uL — ABNORMAL HIGH (ref 1.7–7.7)
Neutrophils Relative %: 78 %
Platelets: 221 10*3/uL (ref 150–400)
RBC: 5.41 MIL/uL (ref 4.22–5.81)
RDW: 13.1 % (ref 11.5–15.5)
WBC: 10.7 10*3/uL — ABNORMAL HIGH (ref 4.0–10.5)
nRBC: 0 % (ref 0.0–0.2)

## 2020-01-01 LAB — COMPREHENSIVE METABOLIC PANEL
ALT: 33 U/L (ref 0–44)
AST: 36 U/L (ref 15–41)
Albumin: 5.5 g/dL — ABNORMAL HIGH (ref 3.5–5.0)
Alkaline Phosphatase: 75 U/L (ref 38–126)
BUN: 38 mg/dL — ABNORMAL HIGH (ref 6–20)
CO2: 21 mmol/L — ABNORMAL LOW (ref 22–32)
Calcium: 10.8 mg/dL — ABNORMAL HIGH (ref 8.9–10.3)
Chloride: 91 mmol/L — ABNORMAL LOW (ref 98–111)
Creatinine, Ser: 3.27 mg/dL — ABNORMAL HIGH (ref 0.61–1.24)
GFR calc Af Amer: 26 mL/min — ABNORMAL LOW (ref >60)
GFR calc non Af Amer: 22 mL/min — ABNORMAL LOW (ref >60)
Glucose, Bld: 117 mg/dL — ABNORMAL HIGH (ref 70–99)
Potassium: 3.8 mmol/L (ref 3.5–5.1)
Sodium: 133 mmol/L — ABNORMAL LOW (ref 135–145)
Total Bilirubin: 1.9 mg/dL — ABNORMAL HIGH (ref 0.3–1.2)
Total Protein: 9.4 g/dL — ABNORMAL HIGH (ref 6.5–8.1)

## 2020-01-01 LAB — RAPID URINE DRUG SCREEN, HOSP PERFORMED
Amphetamines: NOT DETECTED
Barbiturates: NOT DETECTED
Benzodiazepines: NOT DETECTED
Cocaine: POSITIVE — AB
Opiates: NOT DETECTED
Tetrahydrocannabinol: POSITIVE — AB

## 2020-01-01 LAB — LIPASE, BLOOD: Lipase: 40 U/L (ref 11–51)

## 2020-01-01 LAB — LACTIC ACID, PLASMA: Lactic Acid, Venous: 1.3 mmol/L (ref 0.5–1.9)

## 2020-01-01 LAB — HIV ANTIBODY (ROUTINE TESTING W REFLEX): HIV Screen 4th Generation wRfx: NONREACTIVE

## 2020-01-01 LAB — SARS CORONAVIRUS 2 BY RT PCR (HOSPITAL ORDER, PERFORMED IN ~~LOC~~ HOSPITAL LAB): SARS Coronavirus 2: NEGATIVE

## 2020-01-01 LAB — ETHANOL: Alcohol, Ethyl (B): <10 mg/dL (ref <10)

## 2020-01-01 LAB — CK: Total CK: 270 U/L (ref 49–397)

## 2020-01-01 MED ORDER — FOLIC ACID 1 MG PO TABS
1.0000 mg | ORAL_TABLET | Freq: Every day | ORAL | Status: DC
  Administered 2020-01-01 – 2020-01-02 (×2): 1 mg via ORAL
  Filled 2020-01-01 (×2): qty 1

## 2020-01-01 MED ORDER — ONDANSETRON HCL 4 MG/2ML IJ SOLN
4.0000 mg | Freq: Four times a day (QID) | INTRAMUSCULAR | Status: DC | PRN
  Administered 2020-01-01: 4 mg via INTRAVENOUS
  Filled 2020-01-01: qty 2

## 2020-01-01 MED ORDER — THIAMINE HCL 100 MG/ML IJ SOLN
100.0000 mg | Freq: Every day | INTRAMUSCULAR | Status: DC
  Administered 2020-01-01: 100 mg via INTRAVENOUS
  Filled 2020-01-01: qty 2

## 2020-01-01 MED ORDER — SODIUM CHLORIDE 0.9 % IV SOLN: INTRAVENOUS | Status: DC

## 2020-01-01 MED ORDER — ACETAMINOPHEN 650 MG RE SUPP: 650.0000 mg | Freq: Four times a day (QID) | RECTAL | Status: DC | PRN

## 2020-01-01 MED ORDER — ALBUTEROL SULFATE (2.5 MG/3ML) 0.083% IN NEBU: 2.5000 mg | INHALATION_SOLUTION | Freq: Four times a day (QID) | RESPIRATORY_TRACT | Status: DC | PRN

## 2020-01-01 MED ORDER — PROMETHAZINE HCL 25 MG/ML IJ SOLN
12.5000 mg | Freq: Once | INTRAMUSCULAR | Status: AC
  Administered 2020-01-01: 12.5 mg via INTRAVENOUS
  Filled 2020-01-01: qty 1

## 2020-01-01 MED ORDER — LORAZEPAM 2 MG/ML IJ SOLN: 0.0000 mg | Freq: Two times a day (BID) | INTRAMUSCULAR | Status: DC

## 2020-01-01 MED ORDER — ADULT MULTIVITAMIN W/MINERALS CH
1.0000 | ORAL_TABLET | Freq: Every day | ORAL | Status: DC
  Administered 2020-01-01 – 2020-01-02 (×2): 1 via ORAL
  Filled 2020-01-01 (×2): qty 1

## 2020-01-01 MED ORDER — SODIUM CHLORIDE 0.9 % IV BOLUS
1000.0000 mL | Freq: Once | INTRAVENOUS | Status: AC
  Administered 2020-01-01: 1000 mL via INTRAVENOUS

## 2020-01-01 MED ORDER — ONDANSETRON HCL 4 MG/2ML IJ SOLN
4.0000 mg | Freq: Once | INTRAMUSCULAR | Status: AC
  Administered 2020-01-01: 4 mg via INTRAVENOUS
  Filled 2020-01-01: qty 2

## 2020-01-01 MED ORDER — THIAMINE HCL 100 MG PO TABS
100.0000 mg | ORAL_TABLET | Freq: Every day | ORAL | Status: DC
  Administered 2020-01-02: 100 mg via ORAL
  Filled 2020-01-01: qty 1

## 2020-01-01 MED ORDER — ONDANSETRON HCL 4 MG PO TABS: 4.0000 mg | ORAL_TABLET | Freq: Four times a day (QID) | ORAL | Status: DC | PRN

## 2020-01-01 MED ORDER — LORAZEPAM 2 MG/ML IJ SOLN: 1.0000 mg | INTRAMUSCULAR | Status: DC | PRN

## 2020-01-01 MED ORDER — ACETAMINOPHEN 325 MG PO TABS
650.0000 mg | ORAL_TABLET | Freq: Four times a day (QID) | ORAL | Status: DC | PRN
  Administered 2020-01-01: 650 mg via ORAL
  Filled 2020-01-01: qty 2

## 2020-01-01 MED ORDER — LORAZEPAM 1 MG PO TABS: 1.0000 mg | ORAL_TABLET | ORAL | Status: DC | PRN

## 2020-01-01 MED ORDER — LORAZEPAM 2 MG/ML IJ SOLN
0.0000 mg | Freq: Four times a day (QID) | INTRAMUSCULAR | Status: DC
  Administered 2020-01-01 (×2): 1 mg via INTRAVENOUS
  Filled 2020-01-01: qty 1

## 2020-01-01 MED ORDER — LACTATED RINGERS IV BOLUS
1000.0000 mL | Freq: Once | INTRAVENOUS | Status: AC
  Administered 2020-01-01: 1000 mL via INTRAVENOUS

## 2020-01-01 MED ORDER — HEPARIN SODIUM (PORCINE) 5000 UNIT/ML IJ SOLN
5000.0000 [IU] | Freq: Three times a day (TID) | INTRAMUSCULAR | Status: DC
  Administered 2020-01-01: 5000 [IU] via SUBCUTANEOUS
  Filled 2020-01-01: qty 1

## 2020-01-01 NOTE — ED Triage Notes (Signed)
Pt reports emesis that started yesterday at 1700 after drinking alcohol and "snorting and cooking cocaine" for the first time in 6 months.

## 2020-01-01 NOTE — TOC Initial Note (Signed)
Transition of Care [facility]) - Initial/Assessment Note   Patient Details  Name: Brendan Ochoa MRN: [id number] Date of Birth: 1975/11/14  [facility] of [facility]) CM/SW Contact:    [name], LCSW Phone Number: 01/01/2020, 11:27 AM  Clinical Narrative: Patient is a 44 year old male who is under observation for acute kidney injury. Per chart review, patient does not have insurance or PCP. CSW spoke with patient regarding referrals to financial counselor to determine if he is eligible for Medicaid or other financial assistance program and [facility] for PCP assistance. Patient agreeable to both referrals. CSW made referral to financial counselor, [name]. CSW called [facility] and made referral for PCP assistance. [facility] to follow up after discharge.  Expected Discharge Plan: Home/Self Care Barriers to Discharge: Continued Medical Work up, Inadequate or no insurance  Expected Discharge Plan and Services Expected Discharge Plan: Home/Self Care In-house Referral: Clinical Social Work, Conservation officer, nature Services: NA Post Acute Care Choice: NA Living arrangements for the past 2 months: Single Family Home             DME Arranged: N/A DME Agency: NA HH Arranged: NA HH Agency: NA  Prior Living Arrangements/Services Living arrangements for the past 2 months: [place] Patient language and need for interpreter reviewed:: Yes Do you feel safe going back to the place where you live?: Yes      Need for Family Participation in Patient Care: No (Comment) Care giver support system in place?: Yes (comment) Criminal Activity/Legal Involvement Pertinent to Current Situation/Hospitalization: No - Comment as needed  Permission Sought/Granted Permission sought to share information with : Facility Personal assistant Information with NAME: Development worker, [name]) Permission granted to share info w AGENCY: [facility]  Emotional  Assessment Appearance:: Appears stated age Attitude/Demeanor/Rapport: Lethargic Affect (typically observed): Accepting Orientation: : Oriented to Self, Oriented to Place, Oriented to  Time, Oriented to Situation Alcohol / Substance Use: Alcohol Use, Illicit Drugs Psych Involvement: No (comment)  Admission diagnosis:  AKI (acute kidney injury) ([place]) [N17.9] Patient Active Problem List   Diagnosis Date Noted  . AKI (acute kidney injury) ([place]) 01/01/2020  . Ileitis 02/18/2018  . HYPERGLYCEMIA 11/01/2009  . ABNORMAL WEIGHT GAIN 06/15/2009  . TOBACCO ABUSE 07/18/2007  . SUBSTANCE ABUSE, MULTIPLE 07/18/2007  . DEPRESSION 07/18/2007  . ALLERGIC RHINITIS 07/18/2007  . ASTHMA 07/18/2007  . GERD 07/18/2007  . Crohn's disease ([place]) 07/18/2007   PCP:  Patient, No Pcp Per Pharmacy:   [id number] [place], [place] - [address] [place] [address]Phone: [phone]: [phone] Readmission Risk Interventions No flowsheet data found.

## 2020-01-01 NOTE — ED Provider Notes (Signed)
[facility] EMERGENCY DEPARTMENT Provider Note   CSN: [id number] Arrival date & time: 01/01/20  0231     History Chief Complaint  Patient presents with  . Emesis    Brendan Ochoa is a 44 y.o. male.  HPI Patient presents with nausea vomiting since last night.  States it began after use some extra strong cocaine.  States it seems stronger than normal.  However he has been feeling bad for the last couple months.  States he began to feel bad after he got his first Covid vaccine.  States he did have Covid back in February.  States he is more fatigued.  More difficulty breathing lying down.  Has mild abdominal pain now but states it is from throwing up is done.  States he is really not been drinking alcohol.  States he been sober for a while up until he relapsed on the cocaine.  No chest pain.  States he has fatigue.  No real diarrhea.  History of Crohn's disease.  States his urine has been extra dark.    Past Medical History:  Diagnosis Date  . Asthma   . Cocaine abuse ([place])    in past  . Crohn disease ([place]) 2008  . Depression    in past, not current as of 2019   . ETOH abuse    history of alcohol use  . GERD (gastroesophageal reflux disease)     Patient Active Problem List   Diagnosis Date Noted  . Ileitis 02/18/2018  . HYPERGLYCEMIA 11/01/2009  . ABNORMAL WEIGHT GAIN 06/15/2009  . TOBACCO ABUSE 07/18/2007  . SUBSTANCE ABUSE, MULTIPLE 07/18/2007  . DEPRESSION 07/18/2007  . ALLERGIC RHINITIS 07/18/2007  . ASTHMA 07/18/2007  . GERD 07/18/2007  . Crohn's disease ([place]) 07/18/2007    Past Surgical History:  Procedure Laterality Date  . BIOPSY  10/15/2017   Procedure: BIOPSY;  Surgeon: Danie Binder, MD;  Location[facility];  Service: Endoscopy;;  terminal ileum colon  . COLONOSCOPY WITH PROPOFOL N/A 10/15/2017   redundant colon, normal rectum, external and internal hemorrhoids, quiescent crohn's. Pathology: inactive chronicn nonspecific ileitis  .  ESOPHAGOGASTRODUODENOSCOPY (EGD) WITH PROPOFOL N/A 10/15/2017   normal esophagus, small hiatal hernia, gastritis, duodenitis  . FINGER AMPUTATION Right 2012   cut finger table saw and surgically amputated       Family History  Problem Relation Age of Onset  . Stomach cancer Maternal Grandfather   . Colon cancer Neg Hx     Social History   Tobacco Use  . Smoking status: Current Every Day Smoker    Packs/day: 0.25    Years: 15.00    Pack years: 3.75    Types: Cigarettes  . Smokeless tobacco: Never Used  . Tobacco comment: 1/4 to 1/2 pack per day   Vaping Use  . Vaping Use: Never used  Substance Use Topics  . Alcohol use: Yes    Comment: occasionally  . Drug use: Yes    Types: Cocaine    Comment: last use 2 days ago    Home Medications Prior to Admission medications   Medication Sig Start Date End Date Taking? Authorizing Provider  acetaminophen (TYLENOL) 500 MG tablet Take 1,000 mg by mouth every 6 (six) hours as needed for headache.    [provider]  albuterol (PROVENTIL HFA;VENTOLIN HFA) 108 (90 Base) MCG/ACT inhaler Inhale 2 puffs into the lungs every 6 (six) hours as needed. 07/30/18   Rancour, Annie Main, MD  benzonatate (TESSALON) 100 MG capsule  Take 1 capsule (100 mg total) by mouth every 8 (eight) hours. 07/30/18   Rancour, Annie Main, MD  Cyanocobalamin (VITAMIN B-12) 5000 MCG SUBL Place 10,000 mcg under the tongue as needed.     [provider]  diphenhydrAMINE (BENADRYL) 25 mg capsule Take 25 mg by mouth daily as needed (allergies).    [provider]  doxycycline (VIBRAMYCIN) 100 MG capsule Take 1 capsule (100 mg total) by mouth 2 (two) times daily. 07/30/18   Rancour, Annie Main, MD  glucose 4 GM chewable tablet Chew 1 tablet by mouth as needed for low blood sugar.    [provider]  Probiotic Product (PROBIOTIC PO) Take 1 tablet by mouth daily.    [provider]    Allergies    Patient has no known allergies.  Review of  Systems   Review of Systems  Constitutional: Positive for appetite change and fatigue.  HENT: Negative for congestion.   Respiratory: Negative for shortness of breath.   Cardiovascular: Negative for chest pain.  Gastrointestinal: Positive for abdominal pain, nausea and vomiting.  Genitourinary: Negative for flank pain.  Musculoskeletal: Negative for back pain.  Skin: Negative for rash.  Neurological: Negative for weakness.  Psychiatric/Behavioral: Negative for confusion.    Physical Exam Updated Vital Signs BP (!) 139/96 (BP Location: Right Arm)   Pulse (!) 119   Temp 97.8 F (36.6 C) (Oral)   Resp (!) 21   Ht 5' 11"  (1.803 m)   Wt 74 kg   SpO2 100%   BMI 22.75 kg/m   Physical Exam Vitals and nursing note reviewed.  HENT:     Head: Normocephalic.  Cardiovascular:     Comments: Mild tachycardia Pulmonary:     Breath sounds: No wheezing, rhonchi or rales.  Abdominal:     Comments: Mild upper abdominal tenderness without rebound or guarding.  Musculoskeletal:        General: No tenderness.     Cervical back: Neck supple.     Right lower leg: No edema.     Left lower leg: No edema.  Skin:    General: Skin is warm.     Capillary Refill: Capillary refill takes less than 2 seconds.  Neurological:     Mental Status: He is alert and oriented to person, place, and time.  Psychiatric:        Mood and Affect: Mood normal.     ED Results / Procedures / Treatments   Labs (all labs ordered are listed, but only abnormal results are displayed) Labs Reviewed  COMPREHENSIVE METABOLIC PANEL - Abnormal; Notable for the following components:      Result Value   Sodium 133 (*)    Chloride 91 (*)    CO2 21 (*)    Glucose, Bld 117 (*)    BUN 38 (*)    Creatinine, Ser 3.27 (*)    Calcium 10.8 (*)    Total Protein 9.4 (*)    Albumin 5.5 (*)    Total Bilirubin 1.9 (*)    GFR calc non Af Amer 22 (*)    GFR calc Af Amer 26 (*)    All other components within normal limits  CBC  WITH DIFFERENTIAL/PLATELET - Abnormal; Notable for the following components:   WBC 10.7 (*)    Hemoglobin 17.4 (*)    Neutro Abs 8.3 (*)    Monocytes Absolute 1.1 (*)    All other components within normal limits  SARS CORONAVIRUS 2 BY RT PCR [facility] ORDER,  [place] LAB)  ETHANOL  LACTIC ACID, PLASMA  LACTIC ACID, PLASMA  RAPID URINE DRUG SCREEN, HOSP PERFORMED    EKG None  Radiology No results found.  Procedures Procedures (including critical care time)  Medications Ordered in ED Medications  lactated ringers bolus 1,000 mL (has no administration in time range)  sodium chloride 0.9 % bolus 1,000 mL (1,000 mLs Intravenous New Bag/Given 01/01/20 0533)  ondansetron (ZOFRAN) injection 4 mg (4 mg Intravenous Given 01/01/20 0534)    ED Course  I have reviewed the triage vital signs and the nursing notes.  Pertinent labs & imaging results that were available during my care of the patient were reviewed by me and considered in my medical decision making (see chart for details).    MDM Rules/Calculators/A&P                          Patient presents after nausea and vomiting.  However does have an acute kidney injury.  If he began vomiting at 5:00 last night is a little quick to have his creatinine go up to 3.  States has been weak for a while however.  With acute kidney injury I feels the patient benefit from [place] to the hospital.  Will discuss with hospitalist as patient states that he used to see the free clinic. Final Clinical Impression(s) / ED Diagnoses Final diagnoses:  AKI (acute kidney injury) ([place])  Substance abuse [facility])    Rx / [place] Orders ED Discharge Orders    None       Davonna Belling, MD 01/01/20 [phone]

## 2020-01-01 NOTE — H&P (Signed)
History and Physical    Brendan Ochoa UMP:[id number] DOB: Aug 10, 1975 DOA: 01/01/2020  PCP: Patient, No Pcp Per  Patient coming from: Home  I have personally briefly reviewed patient's old medical records in [facility]  Chief Complaint: Vomiting  HPI: Brendan Ochoa is a 44 y.o. male with medical history significant of Crohn's disease who is not on any chronic maintenance therapy, alcohol use, cocaine use, used cocaine last night and developed persistent vomiting.  He reports vomiting every time he tried to take anything by mouth.  He denies any diarrhea.  No melena or hematochezia.  He has not had any fever.  He reports feeling unwell since he took his first Covid vaccination approximately 2 months ago.  Has been feeling increasingly fatigued.  ED Course: On arrival to the emergency room he is noted to be dehydrated mildly tachycardic.  Creatinine is elevated at 3.27.  Transaminases are in normal range.  Bilirubin mildly elevated at 1.9.  Lipase normal.  Total CK of 270.  Review of Systems: Review of Systems  Constitutional: Negative for chills and fever.  Eyes: Negative for blurred vision and double vision.  Respiratory: Negative for cough and shortness of breath.   Cardiovascular: Positive for chest pain. Negative for palpitations.  Gastrointestinal: Positive for abdominal pain, nausea and vomiting. Negative for diarrhea and melena.  Genitourinary: Negative for dysuria.  Neurological: Positive for dizziness and weakness.       Past Medical History:  Diagnosis Date  . Asthma   . Cocaine abuse ([place])    in past  . Crohn disease ([place]) 2008  . Depression    in past, not current as of 2019   . ETOH abuse    history of alcohol use  . GERD (gastroesophageal reflux disease)     Past Surgical History:  Procedure Laterality Date  . BIOPSY  10/15/2017   Procedure: BIOPSY;  Surgeon: Danie Binder, MD;  Location: [facility];  Service: Endoscopy;;  terminal  ileum colon  . COLONOSCOPY WITH PROPOFOL N/A 10/15/2017   redundant colon, normal rectum, external and internal hemorrhoids, quiescent crohn's. Pathology: inactive chronicn nonspecific ileitis  . ESOPHAGOGASTRODUODENOSCOPY (EGD) WITH PROPOFOL N/A 10/15/2017   normal esophagus, small hiatal hernia, gastritis, duodenitis  . FINGER AMPUTATION Right 2012   cut finger table saw and surgically amputated    Social History:  reports that he has been smoking cigarettes. He has a 3.75 pack-year smoking history. He has never used smokeless tobacco. He reports current alcohol use. He reports current drug use. Drug: Cocaine.  No Known Allergies  Family History  Problem Relation Age of Onset  . Stomach cancer Maternal Grandfather   . Colon cancer Neg Hx      Prior to Admission medications   Medication Sig Start Date End Date Taking? Authorizing Provider  acetaminophen (TYLENOL) 500 MG tablet Take 1,000 mg by mouth every 6 (six) hours as needed for headache.   Yes [provider]  Probiotic Product (PROBIOTIC PO) Take 1 tablet by mouth daily.   Yes [provider]    Physical Exam: Vitals:   01/01/20 0752 01/01/20 1118 01/01/20 1245 01/01/20 1645  BP:  111/63 94/74 116/89  Pulse: 75 70 80 65  Resp: (!) 22 16 18 16   Temp:  98.8 F (37.1 C) 98.4 F (36.9 C) 97.6 F (36.4 C)  TempSrc:  Oral Oral Oral  SpO2: 99% 97% 98% 100%  Weight:   68.8 kg   Height:  5' 11"  (1.803 m)     Constitutional: NAD, calm, comfortable Eyes: PERRL, lids and conjunctivae normal ENMT: Mucous membranes are moist. Posterior pharynx clear of any exudate or lesions.Normal dentition.  Neck: normal, supple, no masses, no thyromegaly Respiratory: clear to auscultation bilaterally, no wheezing, no crackles. Normal respiratory effort. No accessory muscle use.  Cardiovascular: Regular rate and rhythm, no murmurs / rubs / gallops. No extremity edema. 2+ pedal pulses. No carotid bruits.  Abdomen: no  tenderness, no masses palpated. No hepatosplenomegaly. Bowel sounds positive.  Musculoskeletal: no clubbing / cyanosis. No joint deformity upper and lower extremities. Good ROM, no contractures. Normal muscle tone.  Skin: no rashes, lesions, ulcers. No induration Neurologic: CN 2-12 grossly intact. Sensation intact, DTR normal. Strength 5/5 in all 4.  Psychiatric: Normal judgment and insight. Alert and oriented x 3. Normal mood.    Labs on Admission: I have personally reviewed following labs and imaging studies  CBC: Recent Labs  Lab 01/01/20 0534  WBC 10.7*  NEUTROABS 8.3*  HGB 17.4*  HCT 49.9  MCV 92.2  PLT 595   Basic Metabolic Panel: Recent Labs  Lab 01/01/20 0534  NA 133*  K 3.8  CL 91*  CO2 21*  GLUCOSE 117*  BUN 38*  CREATININE 3.27*  CALCIUM 10.8*   GFR: Estimated Creatinine Clearance: 28.6 mL/min (A) (by C-G formula based on SCr of 3.27 mg/dL (H)). Liver Function Tests: Recent Labs  Lab 01/01/20 0534  AST 36  ALT 33  ALKPHOS 75  BILITOT 1.9*  PROT 9.4*  ALBUMIN 5.5*   Recent Labs  Lab 01/01/20 1310  LIPASE 40   No results for input(s): AMMONIA in the last 168 hours. Coagulation Profile: No results for input(s): INR, PROTIME in the last 168 hours. Cardiac Enzymes: Recent Labs  Lab 01/01/20 1310  CKTOTAL 270   BNP (last 3 results) No results for input(s): PROBNP in the last 8760 hours. HbA1C: No results for input(s): HGBA1C in the last 72 hours. CBG: No results for input(s): GLUCAP in the last 168 hours. Lipid Profile: No results for input(s): CHOL, HDL, LDLCALC, TRIG, CHOLHDL, LDLDIRECT in the last 72 hours. Thyroid Function Tests: No results for input(s): TSH, T4TOTAL, FREET4, T3FREE, THYROIDAB in the last 72 hours. Anemia Panel: No results for input(s): VITAMINB12, FOLATE, FERRITIN, TIBC, IRON, RETICCTPCT in the last 72 hours. Urine analysis:    Component Value Date/Time   COLORURINE COLORLESS (A) 11/20/2016 2141   APPEARANCEUR  CLEAR 11/20/2016 2141   LABSPEC 1.002 (L) 11/20/2016 2141   PHURINE 7.0 11/20/2016 2141   GLUCOSEU NEGATIVE 11/20/2016 2141   HGBUR NEGATIVE 11/20/2016 2141   BILIRUBINUR NEGATIVE 11/20/2016 2141   KETONESUR NEGATIVE 11/20/2016 2141   PROTEINUR NEGATIVE 11/20/2016 2141   UROBILINOGEN 0.2 10/23/2010 0103   NITRITE NEGATIVE 11/20/2016 2141   LEUKOCYTESUR NEGATIVE 11/20/2016 2141    Radiological Exams on Admission: US RENAL  Result Date: 01/01/2020 CLINICAL DATA:  Acute kidney injury. EXAM: RENAL / URINARY TRACT ULTRASOUND COMPLETE COMPARISON:  CT abdomen and pelvis 11/21/2016. FINDINGS: Right Kidney: Renal measurements: 11.0 x 5.0 x 6.1 cm = volume: There is 174.4 mL. Echogenicity within normal limits. No mass or hydronephrosis visualized. Left Kidney: Renal measurements: 10.9 x 6.4 x 5.9 cm = volume: 217.3 mL. Echogenicity within normal limits. No mass or hydronephrosis visualized. Bladder: Appears normal for degree of bladder distention. Other: None. IMPRESSION: Negative exam. Electronically Signed   By: Inge Rise M.D.   On: 01/01/2020 15:37   DG Chest Portable  1 View  Result Date: 01/01/2020 CLINICAL DATA:  Shortness of breath. EXAM: PORTABLE CHEST 1 VIEW COMPARISON:  07/29/2018 FINDINGS: The heart size and mediastinal contours are within normal limits. Both lungs are clear. No discernible pneumothorax. No pleural effusion. The visualized skeletal structures are unremarkable. IMPRESSION: No acute cardiopulmonary disease. Electronically Signed   By: Margaretha Sheffield MD   On: 01/01/2020 08:04    EKG: Independently reviewed.  Sinus rhythm without acute changes  Assessment/Plan Active Problems:   Crohn's disease (H[facility])   AKI (acute kidney injury) ([place])   Vomiting   Cocaine abuse ([place])   Alcohol abuse     1. Acute kidney injury.  Likely related to volume depletion from persistent vomiting and likely vasoconstriction from cocaine.  Continue IV fluids.  Renal ultrasound is  unrevealing.  Monitor urine output. 2. Vomiting.  Symptoms appear to have resolved.  Start on clear liquids and advance as tolerated.  If vomiting persists/recurs, will consider further abdominal imaging. 3. Alcohol abuse.  Last drink of alcohol was yesterday.  Started on CIWA protocol. 4. Cocaine abuse.  TOC consult to offer resources for rehab programs 5. Crohn's disease.  No signs of active flares at this time.  DVT prophylaxis: Heparin Code Status: Full code Family Communication: Discussed with patient Disposition Plan: Discharge home in a.m. if renal function has improved Consults called:   Admission status: Observation, MedSurg  Kathie Dike MD [facility]   If 7PM-7AM, please contact night-coverage [email or web address]   01/01/2020, 6:32 PM

## 2020-01-01 NOTE — ED Notes (Signed)
Report given to 300 nurse

## 2020-01-02 LAB — COMPREHENSIVE METABOLIC PANEL
ALT: 25 U/L (ref 0–44)
AST: 37 U/L (ref 15–41)
Albumin: 3.4 g/dL — ABNORMAL LOW (ref 3.5–5.0)
Alkaline Phosphatase: 50 U/L (ref 38–126)
Anion gap: 9 (ref 5–15)
BUN: 21 mg/dL — ABNORMAL HIGH (ref 6–20)
CO2: 24 mmol/L (ref 22–32)
Calcium: 8.3 mg/dL — ABNORMAL LOW (ref 8.9–10.3)
Chloride: 102 mmol/L (ref 98–111)
Creatinine, Ser: 0.81 mg/dL (ref 0.61–1.24)
GFR calc Af Amer: >60 mL/min (ref >60)
GFR calc non Af Amer: >60 mL/min (ref >60)
Glucose, Bld: 93 mg/dL (ref 70–99)
Potassium: 3.3 mmol/L — ABNORMAL LOW (ref 3.5–5.1)
Sodium: 135 mmol/L (ref 135–145)
Total Bilirubin: 2.4 mg/dL — ABNORMAL HIGH (ref 0.3–1.2)
Total Protein: 6.2 g/dL — ABNORMAL LOW (ref 6.5–8.1)

## 2020-01-02 LAB — CBC
HCT: 41.3 % (ref 39.0–52.0)
Hemoglobin: 13.7 g/dL (ref 13.0–17.0)
MCH: 31.4 pg (ref 26.0–34.0)
MCHC: 33.2 g/dL (ref 30.0–36.0)
MCV: 94.7 fL (ref 80.0–100.0)
Platelets: 156 10*3/uL (ref 150–400)
RBC: 4.36 MIL/uL (ref 4.22–5.81)
RDW: 12.4 % (ref 11.5–15.5)
WBC: 4.6 10*3/uL (ref 4.0–10.5)
nRBC: 0 % (ref 0.0–0.2)

## 2020-01-02 MED ORDER — ONDANSETRON 4 MG PO TBDP: 4.0000 mg | ORAL_TABLET | Freq: Three times a day (TID) | ORAL | 0 refills | Status: DC | PRN

## 2020-01-02 MED ORDER — ALBUTEROL SULFATE HFA 108 (90 BASE) MCG/ACT IN AERS: 2.0000 | INHALATION_SPRAY | Freq: Four times a day (QID) | RESPIRATORY_TRACT | 2 refills | Status: DC | PRN

## 2020-01-02 NOTE — Discharge Summary (Signed)
Physician Discharge Summary  Shray Hunley Lundquist CHY:[id number] DOB: 11-02-1975 DOA: 01/01/2020  PCP: Patient, No Pcp Per  Admit date: 01/01/2020 Discharge date: 01/02/2020  Admitted From: Home Disposition: Home  Recommendations for Outpatient Follow-up:  1. Follow up with PCP in 1-2 weeks 2. Please obtain BMP/CBC in one week  Home Health: Equipment/Devices:  Discharge Condition: Stable CODE STATUS: Full code Diet recommendation: Heart healthy  Brief/Interim Summary: HPI: RANGER PETRICH is a 44 y.o. male with medical history significant of Crohn's disease who is not on any chronic maintenance therapy, alcohol use, cocaine use, used cocaine last night and developed persistent vomiting.  He reports vomiting every time he tried to take anything by mouth.  He denies any diarrhea.  No melena or hematochezia.  He has not had any fever.  He reports feeling unwell since he took his first Covid vaccination approximately 2 months ago.  Has been feeling increasingly fatigued.  ED Course: On arrival to the emergency room he is noted to be dehydrated mildly tachycardic.  Creatinine is elevated at 3.27.  Transaminases are in normal range.  Bilirubin mildly elevated at 1.9.  Lipase normal.  Total CK of 270.  Discharge Diagnoses:  Active Problems:   Crohn's disease ([place])   AKI (acute kidney injury) ([place])   Vomiting   Cocaine abuse ([place])   Alcohol abuse  1. Acute kidney injury.  Likely related to volume depletion from persistent vomiting and likely vasoconstriction from cocaine.    Renal ultrasound unrevealing.  Renal function has normalized with IV fluids. 2. Vomiting.  Symptoms appear to have resolved.    Diet was advanced and is not had any further vomiting. 3. Alcohol abuse.  Last drink of alcohol was on the day prior to admission.    Monitored on CIWA protocol.  No signs of withdrawal. 4. Cocaine abuse.  TOC consulted to offer resources for rehab programs 5. Crohn's disease.  No signs of  active flares at this time.  Discharge Instructions  Discharge Instructions    Diet - low sodium heart healthy   Complete by: As directed    Increase activity slowly   Complete by: As directed      Allergies as of 01/02/2020   No Known Allergies     Medication List    TAKE these medications   acetaminophen 500 MG tablet Commonly known as: TYLENOL Take 1,000 mg by mouth every 6 (six) hours as needed for headache.   albuterol 108 (90 Base) MCG/ACT inhaler Commonly known as: VENTOLIN HFA Inhale 2 puffs into the lungs every 6 (six) hours as needed for wheezing or shortness of breath.   ondansetron 4 MG disintegrating tablet Commonly known as: Zofran ODT Take 1 tablet (4 mg total) by mouth every 8 (eight) hours as needed for nausea or vomiting.   PROBIOTIC PO Take 1 tablet by mouth daily.       No Known Allergies  Consultations:     Procedures/Studies: US RENAL  Result Date: 01/01/2020 CLINICAL DATA:  Acute kidney injury. EXAM: RENAL / URINARY TRACT ULTRASOUND COMPLETE COMPARISON:  CT abdomen and pelvis 11/21/2016. FINDINGS: Right Kidney: Renal measurements: 11.0 x 5.0 x 6.1 cm = volume: There is 174.4 mL. Echogenicity within normal limits. No mass or hydronephrosis visualized. Left Kidney: Renal measurements: 10.9 x 6.4 x 5.9 cm = volume: 217.3 mL. Echogenicity within normal limits. No mass or hydronephrosis visualized. Bladder: Appears normal for degree of bladder distention. Other: None. IMPRESSION: Negative exam. Electronically Signed   By:  Inge Rise M.D.   On: 01/01/2020 15:37   DG Chest Portable 1 View  Result Date: 01/01/2020 CLINICAL DATA:  Shortness of breath. EXAM: PORTABLE CHEST 1 VIEW COMPARISON:  07/29/2018 FINDINGS: The heart size and mediastinal contours are within normal limits. Both lungs are clear. No discernible pneumothorax. No pleural effusion. The visualized skeletal structures are unremarkable. IMPRESSION: No acute cardiopulmonary disease.  Electronically Signed   By: Margaretha Sheffield MD   On: 01/01/2020 08:04       Subjective: Feels better.  Reports better urine output.  No nausea or vomiting.  Discharge Exam: Vitals:   01/01/20 2048 01/02/20 0047 01/02/20 0525 01/02/20 1340  BP: 110/78 93/72 118/79 122/78  Pulse: 63 75 79 82  Resp: 16 20 16 18   Temp: 98.6 F (37 C) 97.8 F (36.6 C) 98.2 F (36.8 C) 98.5 F (36.9 C)  TempSrc: Oral Oral Oral Oral  SpO2: 97% 98% 97% 99%  Weight:      Height:        General: Pt is alert, awake, not in acute distress Cardiovascular: RRR, S1/S2 +, no rubs, no gallops Respiratory: CTA bilaterally, no wheezing, no rhonchi Abdominal: Soft, NT, ND, bowel sounds + Extremities: no edema, no cyanosis    The results of significant diagnostics from this hospitalization (including imaging, microbiology, ancillary and laboratory) are listed below for reference.     Microbiology: Recent Results (from the past 240 hour(s))  SARS Coronavirus 2 by RT PCR (hospital order, performed in [facility] hospital lab) Nasopharyngeal Nasopharyngeal Swab     Status: None   Collection Time: 01/01/20  7:14 AM   Specimen: Nasopharyngeal Swab  Result Value Ref Range Status   SARS Coronavirus 2 NEGATIVE NEGATIVE Final    Comment: (NOTE) SARS-CoV-2 target nucleic acids are NOT DETECTED.  The SARS-CoV-2 RNA is generally detectable in upper and lower respiratory specimens during the acute phase of infection. The lowest concentration of SARS-CoV-2 viral copies this assay can detect is 250 copies / mL. A negative result does not preclude SARS-CoV-2 infection and should not be used as the sole basis for treatment or other patient management decisions.  A negative result may occur with improper specimen collection / handling, submission of specimen other than nasopharyngeal swab, presence of viral mutation(s) within the areas targeted by this assay, and inadequate number of viral copies (<250 copies /  mL). A negative result must be combined with clinical observations, patient history, and epidemiological information.  Fact Sheet for Patients:   StrictlyIdeas.no  Fact Sheet for Healthcare Providers: BankingDealers.co.za  This test is not yet approved or  cleared by the [place] FDA and has been authorized for detection and/or diagnosis of SARS-CoV-2 by FDA under an Emergency Use Authorization (EUA).  This EUA will remain in effect (meaning this test can be used) for the duration of the COVID-19 declaration under Section 564(b)(1) of the Act, 21 U.S.C. section 360bbb-3(b)(1), unless the authorization is terminated or revoked sooner.  Performed at [facility], [address] [place], [place] [address]      Labs: BNP (last 3 results) No results for input(s): BNP in the last 8760 hours. Basic Metabolic Panel: Recent Labs  Lab 01/01/20 0534 01/02/20 0555  NA 133* 135  K 3.8 3.3*  CL 91* 102  CO2 21* 24  GLUCOSE 117* 93  BUN 38* 21*  CREATININE 3.27* 0.81  CALCIUM 10.8* 8.3*   Liver Function Tests: Recent Labs  Lab 01/01/20 0534 01/02/20 0555  AST  36 37  ALT 33 25  ALKPHOS 75 50  BILITOT 1.9* 2.4*  PROT 9.4* 6.2*  ALBUMIN 5.5* 3.4*   Recent Labs  Lab 01/01/20 1310  LIPASE 40   No results for input(s): AMMONIA in the last 168 hours. CBC: Recent Labs  Lab 01/01/20 0534 01/02/20 0555  WBC 10.7* 4.6  NEUTROABS 8.3*  --   HGB 17.4* 13.7  HCT 49.9 41.3  MCV 92.2 94.7  PLT 221 156   Cardiac Enzymes: Recent Labs  Lab 01/01/20 1310  CKTOTAL 270   BNP: Invalid input(s): POCBNP CBG: No results for input(s): GLUCAP in the last 168 hours. D-Dimer No results for input(s): DDIMER in the last 72 hours. Hgb A1c No results for input(s): HGBA1C in the last 72 hours. Lipid Profile No results for input(s): CHOL, HDL, LDLCALC, TRIG, CHOLHDL, LDLDIRECT in the last 72 hours. Thyroid function studies No  results for input(s): TSH, T4TOTAL, T3FREE, THYROIDAB in the last 72 hours.  Invalid input(s): FREET3 Anemia work up No results for input(s): VITAMINB12, FOLATE, FERRITIN, TIBC, IRON, RETICCTPCT in the last 72 hours. Urinalysis    Component Value Date/Time   COLORURINE COLORLESS (A) 11/20/2016 2141   APPEARANCEUR CLEAR 11/20/2016 2141   LABSPEC 1.002 (L) 11/20/2016 2141   PHURINE 7.0 11/20/2016 2141   GLUCOSEU NEGATIVE 11/20/2016 2141   HGBUR NEGATIVE 11/20/2016 2141   BILIRUBINUR NEGATIVE 11/20/2016 2141   KETONESUR NEGATIVE 11/20/2016 2141   PROTEINUR NEGATIVE 11/20/2016 2141   UROBILINOGEN 0.2 10/23/2010 0103   NITRITE NEGATIVE 11/20/2016 2141   LEUKOCYTESUR NEGATIVE 11/20/2016 2141   Sepsis Labs Invalid input(s): PROCALCITONIN,  WBC,  LACTICIDVEN Microbiology Recent Results (from the past 240 hour(s))  SARS Coronavirus 2 by RT PCR (hospital order, performed in [facility] hospital lab) Nasopharyngeal Nasopharyngeal Swab     Status: None   Collection Time: 01/01/20  7:14 AM   Specimen: Nasopharyngeal Swab  Result Value Ref Range Status   SARS Coronavirus 2 NEGATIVE NEGATIVE Final    Comment: (NOTE) SARS-CoV-2 target nucleic acids are NOT DETECTED.  The SARS-CoV-2 RNA is generally detectable in upper and lower respiratory specimens during the acute phase of infection. The lowest concentration of SARS-CoV-2 viral copies this assay can detect is 250 copies / mL. A negative result does not preclude SARS-CoV-2 infection and should not be used as the sole basis for treatment or other patient management decisions.  A negative result may occur with improper specimen collection / handling, submission of specimen other than nasopharyngeal swab, presence of viral mutation(s) within the areas targeted by this assay, and inadequate number of viral copies (<250 copies / mL). A negative result must be combined with clinical observations, patient history, and epidemiological  information.  Fact Sheet for Patients:   StrictlyIdeas.no  Fact Sheet for Healthcare Providers: BankingDealers.co.za  This test is not yet approved or  cleared by the [place] FDA and has been authorized for detection and/or diagnosis of SARS-CoV-2 by FDA under an Emergency Use Authorization (EUA).  This EUA will remain in effect (meaning this test can be used) for the duration of the COVID-19 declaration under Section 564(b)(1) of the Act, 21 U.S.C. section 360bbb-3(b)(1), unless the authorization is terminated or revoked sooner.  Performed at [facility], [address] [place], [place] [address]      Time coordinating discharge: 62mns  SIGNED:   JKathie Dike MD  [facility] 01/02/2020, 8:01 PM   If 7PM-7AM, please contact night-coverage [email or web address]

## 2020-01-02 NOTE — TOC Transition Note (Signed)
Transition of Care [facility]) - CM/SW Discharge Note   Patient Details  Name: JEDRICK HUTCHERSON MRN: [id number] Date of Birth: 1975/06/15  [facility]) CM/SW Contact:  Natasha Bence, LCSW Phone Number: 01/02/2020, 3:28 PM   Clinical Narrative:    CSW received SA consult for patient. CSW provided SA consult for patient in addition a Health and safety inspector. Patient reported that he is working to identify employment that will allow for flexibility given his medical conditions. CSW discussed packets resources for employment assistance and , contact information for DSS for assistance with applying for disability. CSW also discussed options for SA treatment facilities and their ability to take individuals with out insurance. Patient inquired about medical care for his medical conditions. CSW  identified resources within guide that would assist with medical treatment for uninsured individuals and discussed previous referral for care connect. TOC signing off.    Final next level of care: Home/Self Care Barriers to Discharge: Barriers Resolved   Patient Goals and CMS Choice        Discharge Placement                Patient to be transferred to facility by: N/a Name of family member notified: N/a    Discharge Plan and Services In-house Referral: Clinical Social Work, Conservation officer, nature Services: NA Post Acute Care Choice: NA          DME Arranged: N/A DME Agency: NA       HH Arranged: NA HH Agency: NA        Social Determinants of Health (SDOH) Interventions     Readmission Risk Interventions No flowsheet data found.

## 2020-04-10 ENCOUNTER — Emergency Department (HOSPITAL_COMMUNITY): Payer: Self-pay

## 2020-04-10 ENCOUNTER — Encounter (HOSPITAL_COMMUNITY): Payer: Self-pay | Admitting: *Deleted

## 2020-04-10 ENCOUNTER — Other Ambulatory Visit: Payer: Self-pay

## 2020-04-10 ENCOUNTER — Emergency Department (HOSPITAL_COMMUNITY)
Admission: EM | Admit: 2020-04-10 | Discharge: 2020-04-10 | Disposition: A | Discharge status: Home / Self Care | Payer: Self-pay | Attending: Emergency Medicine | Admitting: Emergency Medicine

## 2020-04-10 DIAGNOSIS — R911 Solitary pulmonary nodule: Secondary | ICD-10-CM | POA: Insufficient documentation

## 2020-04-10 DIAGNOSIS — Y908 Blood alcohol level of 240 mg/100 ml or more: Secondary | ICD-10-CM | POA: Insufficient documentation

## 2020-04-10 DIAGNOSIS — R0602 Shortness of breath: Secondary | ICD-10-CM | POA: Insufficient documentation

## 2020-04-10 DIAGNOSIS — R059 Cough, unspecified: Secondary | ICD-10-CM | POA: Insufficient documentation

## 2020-04-10 DIAGNOSIS — F17210 Nicotine dependence, cigarettes, uncomplicated: Secondary | ICD-10-CM | POA: Insufficient documentation

## 2020-04-10 DIAGNOSIS — R5383 Other fatigue: Secondary | ICD-10-CM | POA: Insufficient documentation

## 2020-04-10 DIAGNOSIS — R109 Unspecified abdominal pain: Secondary | ICD-10-CM | POA: Insufficient documentation

## 2020-04-10 DIAGNOSIS — K219 Gastro-esophageal reflux disease without esophagitis: Secondary | ICD-10-CM | POA: Insufficient documentation

## 2020-04-10 DIAGNOSIS — R112 Nausea with vomiting, unspecified: Secondary | ICD-10-CM | POA: Insufficient documentation

## 2020-04-10 DIAGNOSIS — R062 Wheezing: Secondary | ICD-10-CM | POA: Insufficient documentation

## 2020-04-10 DIAGNOSIS — F1010 Alcohol abuse, uncomplicated: Secondary | ICD-10-CM | POA: Insufficient documentation

## 2020-04-10 LAB — BASIC METABOLIC PANEL
Anion gap: 14 (ref 5–15)
BUN: 12 mg/dL (ref 6–20)
CO2: 23 mmol/L (ref 22–32)
Calcium: 8.6 mg/dL — ABNORMAL LOW (ref 8.9–10.3)
Chloride: 102 mmol/L (ref 98–111)
Creatinine, Ser: 0.76 mg/dL (ref 0.61–1.24)
GFR, Estimated: >60 mL/min (ref >60)
Glucose, Bld: 110 mg/dL — ABNORMAL HIGH (ref 70–99)
Potassium: 3.8 mmol/L (ref 3.5–5.1)
Sodium: 139 mmol/L (ref 135–145)

## 2020-04-10 LAB — CBC
HCT: 42.1 % (ref 39.0–52.0)
Hemoglobin: 13.9 g/dL (ref 13.0–17.0)
MCH: 31.4 pg (ref 26.0–34.0)
MCHC: 33 g/dL (ref 30.0–36.0)
MCV: 95.2 fL (ref 80.0–100.0)
Platelets: 159 10*3/uL (ref 150–400)
RBC: 4.42 MIL/uL (ref 4.22–5.81)
RDW: 13.5 % (ref 11.5–15.5)
WBC: 5.3 10*3/uL (ref 4.0–10.5)
nRBC: 0 % (ref 0.0–0.2)

## 2020-04-10 LAB — TROPONIN I (HIGH SENSITIVITY)
Troponin I (High Sensitivity): 3 ng/L (ref <18)
Troponin I (High Sensitivity): 3 ng/L (ref <18)

## 2020-04-10 LAB — ETHANOL: Alcohol, Ethyl (B): 308 mg/dL (ref <10)

## 2020-04-10 MED ORDER — ALBUTEROL SULFATE HFA 108 (90 BASE) MCG/ACT IN AERS
2.0000 | INHALATION_SPRAY | Freq: Once | RESPIRATORY_TRACT | Status: AC
  Administered 2020-04-10: 2 via RESPIRATORY_TRACT
  Filled 2020-04-10: qty 6.7

## 2020-04-10 MED ORDER — CHLORDIAZEPOXIDE HCL 25 MG PO CAPS: ORAL_CAPSULE | ORAL | 0 refills | Status: DC

## 2020-04-10 NOTE — ED Provider Notes (Signed)
[facility] EMERGENCY DEPARTMENT Provider Note   CSN: [id number] Arrival date & time: 04/10/20  0139     History Chief Complaint  Patient presents with   Weakness    Brendan Ochoa is a 44 y.o. male.  The history is provided by the patient.  Weakness Severity:  Moderate Onset quality:  Gradual Timing:  Constant Progression:  Unchanged Chronicity:  New Context: alcohol use   Relieved by:  Nothing Worsened by:  Nothing Associated symptoms: cough, nausea and vomiting   Patient with history of asthma, Crohn's disease, alcohol abuse Presents for evaluation for generalized weakness and alcohol abuse. Patient reports he would like to get clean and go to detox.  He reports he has been binge drinking for the past month.  He reports generalized weakness.  He reports diffuse body aches.  He reports cough and some shortness of breath He reports he has intermittent abdominal pain due to his Crohn's disease.  He also reports he vomits every morning      Past Medical History:  Diagnosis Date   Asthma    Cocaine abuse (HCC)    in past   Crohn disease (HCC) 2008   Depression    in past, not current as of 2019    ETOH abuse    history of alcohol use   GERD (gastroesophageal reflux disease)     Patient Active Problem List   Diagnosis Date Noted   AKI (acute kidney injury) (HCC) 01/01/2020   Vomiting 01/01/2020   Cocaine abuse (HCC) 01/01/2020   Alcohol abuse 01/01/2020   Ileitis 02/18/2018   HYPERGLYCEMIA 11/01/2009   ABNORMAL WEIGHT GAIN 06/15/2009   TOBACCO ABUSE 07/18/2007   SUBSTANCE ABUSE, MULTIPLE 07/18/2007   DEPRESSION 07/18/2007   ALLERGIC RHINITIS 07/18/2007   ASTHMA 07/18/2007   GERD 07/18/2007   Crohn's disease (HCC) 07/18/2007    Past Surgical History:  Procedure Laterality Date   BIOPSY  10/15/2017   Procedure: BIOPSY;  Surgeon: West Bali, MD;  [facility];  Service: Endoscopy;;  terminal  ileum colon   COLONOSCOPY WITH PROPOFOL N/A 10/15/2017   redundant colon, normal rectum, external and internal hemorrhoids, quiescent crohn's. Pathology: inactive chronicn nonspecific ileitis   ESOPHAGOGASTRODUODENOSCOPY (EGD) WITH PROPOFOL N/A 10/15/2017   normal esophagus, small hiatal hernia, gastritis, duodenitis   FINGER AMPUTATION Right 2012   cut finger table saw and surgically amputated       Family History  Problem Relation Age of Onset   Stomach cancer Maternal Grandfather    Colon cancer Neg Hx     Social History   Tobacco Use   Smoking status: Current Every Day Smoker    Packs/day: 0.25    Years: 15.00    Pack years: 3.75    Types: Cigarettes   Smokeless tobacco: Never Used   Tobacco comment: 1/4 to 1/2 pack per day   Vaping Use   Vaping Use: Never used  Substance Use Topics   Alcohol use: Yes    Comment: occasionally   Drug use: Yes    Types: Cocaine    Comment: last use 2 days ago    Home Medications Prior to Admission medications   Medication Sig Start Date End Date Taking? Authorizing Provider  acetaminophen (TYLENOL) 500 MG tablet Take 1,000 mg by mouth every 6 (six) hours as needed for headache.    [provider]  albuterol (VENTOLIN HFA) 108 (90 Base) MCG/ACT inhaler Inhale 2 puffs into the lungs every 6 (  six) hours as needed for wheezing or shortness of breath. 01/02/20   Erick Blinks, MD  ondansetron (ZOFRAN ODT) 4 MG disintegrating tablet Take 1 tablet (4 mg total) by mouth every 8 (eight) hours as needed for nausea or vomiting. 01/02/20   Erick Blinks, MD  Probiotic Product (PROBIOTIC PO) Take 1 tablet by mouth daily.    [provider]    Allergies    Patient has no known allergies.  Review of Systems   Review of Systems  Constitutional: Positive for fatigue.  Respiratory: Positive for cough.   Gastrointestinal: Positive for nausea and vomiting.       Nonbloody emesis  Neurological: Positive for weakness.    All other systems reviewed and are negative.   Physical Exam Updated Vital Signs BP 129/78    Pulse 99    Temp 98 F (36.7 C)    Resp 18    Ht 1.803 m (5\' 11" )    Wt 77.1 kg    SpO2 96%    BMI 23.71 kg/m   Physical Exam CONSTITUTIONAL: Well developed/well nourished HEAD: Normocephalic/atraumatic EYES: EOMI/PERRL ENMT: Mucous membranes moist NECK: supple no meningeal signs SPINE/BACK:entire spine nontender CV: S1/S2 noted, no murmurs/rubs/gallops noted LUNGS: Scattered wheezing bilaterally, no acute distress ABDOMEN: soft, nontender, no rebound or guarding, bowel sounds noted throughout abdomen GU:no cva tenderness NEURO: Pt is awake/alert/appropriate, moves all extremitiesx4.  No facial droop.   EXTREMITIES: pulses normal/equal, full ROM SKIN: warm, color normal no bruising noted to torso PSYCH: no abnormalities of mood noted, alert and oriented to situation  ED Results / Procedures / Treatments   Labs (all labs ordered are listed, but only abnormal results are displayed) Labs Reviewed  BASIC METABOLIC PANEL - Abnormal; Notable for the following components:      Result Value   Glucose, Bld 110 (*)    Calcium 8.6 (*)    All other components within normal limits  ETHANOL - Abnormal; Notable for the following components:   Alcohol, Ethyl (B) 308 (*)    All other components within normal limits  CBC  TROPONIN I (HIGH SENSITIVITY)  TROPONIN I (HIGH SENSITIVITY)    EKG EKG Interpretation  Date/Time:  Sunday April 10 2020 01:55:53 EST Ventricular Rate:  90 PR Interval:  152 QRS Duration: 90 QT Interval:  378 QTC Calculation: 462 R Axis:   81 Text Interpretation: Normal sinus rhythm Left ventricular hypertrophy When compared with ECG of 01/01/2020, No significant change was found Confirmed by 01/03/2020 (Dione Booze) on 04/10/2020 2:38:38 AM Also confirmed by 04/12/2020 [phone])  on 04/10/2020 5:52:12 AM   Radiology DG Chest 2 View  Result Date:  04/10/2020 CLINICAL DATA:  Weakness EXAM: CHEST - 2 VIEW COMPARISON:  01/01/2020 FINDINGS: There is no evidence for an acute cardiopulmonary process. There is no pneumothorax or large pleural effusion. The heart size is mildly enlarged but stable from prior study. There is no acute osseous abnormality. There is a subtle nodular density projecting at the level of the lingula, only visualized on the frontal view. IMPRESSION: 1. No acute cardiopulmonary process. 2. Subtle nodular density projecting at the level of the lingula. A 4-6 week follow-up two-view chest x-ray is recommended for this finding. This was not well appreciated on the patient's recent prior chest x-ray. Electronically Signed   By: 01/03/2020 M.D.   On: 04/10/2020 02:41    Procedures Procedures   Medications Ordered in ED Medications  albuterol (VENTOLIN HFA) 108 (90 Base) MCG/ACT inhaler 2  puff (has no administration in time range)    ED Course  I have reviewed the triage vital signs and the nursing notes.  Pertinent labs & imaging results that were available during my care of the patient were reviewed by me and considered in my medical decision making (see chart for details).    MDM Rules/Calculators/A&P                          5:55 AM Patient presents requesting detox from alcohol as well as multiple other complaints.  He reports generalized weakness and fatigue. Patient is in no acute distress.  Patient is wheezing, will offer albuterol MDI and advised to quit smoking. He denies a previous history of alcohol withdrawal seizures Overall labs reassuring. EKG is unremarkable.  He has no focal abdominal tenderness. He will need to have a repeat x-ray of the next month to ensure there are no pulmonary nodules.  He will be referred to a PCP. I feel patient is appropriate for outpatient management 6:52 AM Patient updated on plan.  Patient understands he must follow-up as an outpatient.  Patient also informed he must  have repeat x-ray in 1 month to ensure no signs of pulmonary nodules or cancer  outpatient resources given.  also placed orders for case management and peers support to contact patient.  Patient reports his phone number is [phone] He has no signs of significant withdrawal at this time.  Patient's been resting comfortably.  Final Clinical Impression(s) / ED Diagnoses Final diagnoses:  Alcohol abuse  Wheezing  Pulmonary nodule    Rx / DC Orders ED Discharge Orders    None       Zadie Rhine, MD 04/10/20 [phone]

## 2020-04-10 NOTE — ED Triage Notes (Signed)
The pt reports that he is an alcoholic he has had alcohol every day this month now hes c/o weakness and hurting all over expecially his chest   He wants ti be detoxed from [employer]

## 2020-04-10 NOTE — ED Provider Notes (Signed)
Will give patient librium for withdrawal Pt agreeable with plan    Ripley Fraise, MD 04/10/20 [phone]

## 2020-04-10 NOTE — Discharge Instructions (Signed)
Substance Abuse Treatment Programs  Intensive Outpatient Programs [facility]     [address], [place]       [facility] [place] [address] [place], [place]  [facility]     (Inpatient and outpatient)     [address]           [place] [phone] (Suboxone and Methadone)  [place], [place] [address]      [place] [address] [place], [place]  [facility] [place] (Outpatient/Inpatient, [place])    (insurance only) [phone]             [facility] ([place]) [place], [place]     [facility]     [address]     [place], [place]       [facility] (for caregivers and family) [phone] [address] [place]een [place]. [place], [place]      Residential Treatment Programs [facility]      [address], [place] [address]  [phone]       [facility] [address], [place], [place] [address] [phone]  [facility] [place]        [phone]       [facility] [place] [phone]  [facility] ([place]             [place], [place] or [place] of [place], [address] [phone]  [facility] [place]    [address]      [place], [place]       [facility] [address] [place], [place]  [place]   [address] [place], [place] [address]     [phone]      Admissions: 8am-3pm M-F  [facility] (RTS) [address] [place],  [place]  [facility] Program: Residential Program [phone] Days)   [place], [place] or [phone]     ADATC: [facility] [place], [place] (Walk in Hours over the weekend or by referral)  [facility] [place], [place], [place] [address] [phone]  Crisis Mobile: [facility]:  [phone] (for crisis response 24 hours a day) [facility] Hotline:      5513683152 Outpatient Psychiatry and Counseling  [facility]: Mobile Crisis  Management 24 hours:  [phone]  [facility] of the [employer] sliding scale fee and walk in schedule: M-F 8am-12pm/1pm-3pm [address]  [place], [place] [address] [place], [place] [address] [phone]  [facility] (Formerly known as [facility] [employer])- new patient walk-in appointments available Monday - Friday 8am -3pm.          [address] [place], [place] [address] [phone] or crisis line- [place] [facility]/ Intensive Outpatient Therapy Program [place], [place] [address] [place]      [phone] [address] [place], [place] [address]                 [place]   [facility] [phone]. [place], [place] [address]   [employer] of Care          [address] Johnette Abraham  [place], [place] [address]       [phone]  [place], [place], [place] [address] [phone]  [facility]    [address] [place], [place] [address]     [place], [place] Joycelyn Man     [place] [address]     [phone]       [facility] [place] [address]  [facility]     [address], [place], [place], [place] [address] [place]     [address]     [place], [place] [address]     [phone]       [facility] [address] [place], [place] [address] [phone] Resources for Temporary Residential Assistance/Crisis [place] [facility]) M-F 8am-3pm   [address], [place] [address]   [phone] Services include: laundry, barbering, support groups, case management, phone  & computer access, showers, AA/NA mtgs, mental health/substance abuse nurse, job skills class, disability information, VA assistance, spiritual classes, etc.   [facility]OM[facility]ESS [facility]   [address] [place]     [place] (women and children)       [place]. [place], [place] [address] [phone] [email or web address] for application and process Application Required  [facility]   [address]    [place] [address]     [phone]                    [place], [place] [address] [phone](KCLEXNTZ application appt.) Application Required  [facility] (women only)    [address]     [place], [place] [address]     [phone]  Intake starts 6pm daily Need valid ID, SSC, & Police report [employer] [address] [place], [place] [phone] Application Required  [employer] (men only)     [place].      [place], [place]       [place] (Pregnant women only) [address] [place], [place]  [facility]      [place].      [place], [place] [address]     [phone]             [facility] [address] [place], [place] 90 day commitment/SA/Application process  [facility](men only)     [address]     [place], [place]       Check-in at [facility] of [facility] [address] [place], [place] [address] [phone] Men/Women/Women and Children must be there by 7 pm  [place], [place]

## 2020-04-11 NOTE — Social Work (Signed)
Per request of Dr. Bebe Shaggy, CSW followed up with the Pt via telephone @ [phone]. CSW gave Pt number for [facility] and Pt will call and make follow up appointment to connect with PCP. CSW also gave  Resources for Detox [place] should such resources be needed in the future. CSW and Pt also discussed Pt's history with recovery resources and Pt has utilized 12-step recovery in the past and has been reaching out to those connections in the past 2 days.  CSW also provided Pt with the number for [facility] records as Pt wished to be able to access his xrays from the ED. Pt expressed thanks.

## 2020-04-29 ENCOUNTER — Encounter (HOSPITAL_COMMUNITY): Payer: Self-pay | Admitting: Emergency Medicine

## 2020-04-29 ENCOUNTER — Other Ambulatory Visit: Payer: Self-pay

## 2020-04-29 ENCOUNTER — Emergency Department (HOSPITAL_COMMUNITY)
Admission: EM | Admit: 2020-04-29 | Discharge: 2020-04-30 | Disposition: A | Discharge status: Home / Self Care | Payer: Self-pay | Attending: Emergency Medicine | Admitting: Emergency Medicine

## 2020-04-29 ENCOUNTER — Emergency Department (HOSPITAL_COMMUNITY): Payer: Self-pay

## 2020-04-29 DIAGNOSIS — F17210 Nicotine dependence, cigarettes, uncomplicated: Secondary | ICD-10-CM | POA: Insufficient documentation

## 2020-04-29 DIAGNOSIS — R0789 Other chest pain: Secondary | ICD-10-CM | POA: Insufficient documentation

## 2020-04-29 DIAGNOSIS — E871 Hypo-osmolality and hyponatremia: Secondary | ICD-10-CM | POA: Insufficient documentation

## 2020-04-29 DIAGNOSIS — R079 Chest pain, unspecified: Secondary | ICD-10-CM

## 2020-04-29 DIAGNOSIS — R0602 Shortness of breath: Secondary | ICD-10-CM | POA: Insufficient documentation

## 2020-04-29 DIAGNOSIS — J45909 Unspecified asthma, uncomplicated: Secondary | ICD-10-CM | POA: Insufficient documentation

## 2020-04-29 NOTE — ED Triage Notes (Signed)
Pt c/o intermittent chest pressure over the last 2 hours. Reports 7 episodes during that duration. Pt has hx of alcohol abuse. Last used last night and admits to using meth Tuesday night as well as daily marijuana use.

## 2020-04-29 NOTE — ED Provider Notes (Signed)
[facility] Emergency Department Provider Note MRN:  [id number]  Arrival date & time: 04/30/20     Chief Complaint   Shortness of breath History of Present Illness   Brendan Ochoa is a 44 y.o. year-old male with a history of polysubstance abuse, Crohn's disease presenting to the ED with chief complaint of shortness of breath.  Sudden onset shortness of breath and chest pressure intermittently over the past few hours.  Location: Central chest Duration: 3 hours Onset: Sudden Timing: Intermittent Description: Sharp Severity: Mild to moderate Exacerbating/Alleviating Factors: None Associated Symptoms: Anxious, nondiaphoretic Pertinent Negatives: Denies nausea vomiting, no recent fever or cough, no abdominal pain, no numbness or weakness to the arms or legs   Review of Systems  A complete 10 system review of systems was obtained and all systems are negative except as noted in the HPI and PMH.   Patient's Health History    Past Medical History:  Diagnosis Date  . Asthma   . Cocaine abuse ([place])    in past  . Crohn disease ([place]) 2008  . Depression    in past, not current as of 2019   . ETOH abuse    history of alcohol use  . GERD (gastroesophageal reflux disease)     Past Surgical History:  Procedure Laterality Date  . BIOPSY  10/15/2017   Procedure: BIOPSY;  Surgeon: Danie Binder, MD;  Location[facility];  Service: Endoscopy;;  terminal ileum colon  . COLONOSCOPY WITH PROPOFOL N/A 10/15/2017   redundant colon, normal rectum, external and internal hemorrhoids, quiescent crohn's. Pathology: inactive chronicn nonspecific ileitis  . ESOPHAGOGASTRODUODENOSCOPY (EGD) WITH PROPOFOL N/A 10/15/2017   normal esophagus, small hiatal hernia, gastritis, duodenitis  . FINGER AMPUTATION Right 2012   cut finger table saw and surgically amputated    Family History  Problem Relation Age of Onset  . Stomach cancer Maternal Grandfather   . Colon cancer  Neg Hx     Social History   Socioeconomic History  . Marital status: Single    Spouse name: Not on file  . Number of children: Not on file  . Years of education: Not on file  . Highest education level: Not on file  Occupational History  . Not on file  Tobacco Use  . Smoking status: Current Every Day Smoker    Packs/day: 0.25    Years: 15.00    Pack years: 3.75    Types: Cigarettes  . Smokeless tobacco: Never Used  . Tobacco comment: 1/4 to 1/2 pack per day   Vaping Use  . Vaping Use: Never used  Substance and Sexual Activity  . Alcohol use: Yes    Comment: occasionally  . Drug use: Yes    Types: Cocaine    Comment: last use 2 days ago  . Sexual activity: Not on file  Other Topics Concern  . Not on file  Social History Narrative  . Not on file   Social Determinants of Health   Financial Resource Strain: Not on file  Food Insecurity: Not on file  Transportation Needs: Not on file  Physical Activity: Not on file  Stress: Not on file  Social Connections: Not on file  Intimate Partner Violence: Not on file     Physical Exam   Vitals:   04/30/20 0150 04/30/20 0230  BP:    Pulse:    Resp:  16  Temp: 98 F (36.7 C)   SpO2:      CONSTITUTIONAL: Well-appearing,  NAD NEURO:  Alert and oriented x 3, no focal deficits EYES:  eyes equal and reactive ENT/NECK:  no LAD, no JVD CARDIO: Regular rate, well-perfused, normal S1 and S2 PULM:  CTAB no wheezing or rhonchi GI/GU:  normal bowel sounds, non-distended, non-tender MSK/SPINE:  No gross deformities, no edema SKIN:  no rash, atraumatic PSYCH:  Appropriate speech and behavior  *Additional and/or pertinent findings included in MDM below  Diagnostic and Interventional Summary    EKG Interpretation  Date/Time:  Friday April 29 2020 21:52:47 EST Ventricular Rate:  69 PR Interval:    QRS Duration: 98 QT Interval:  411 QTC Calculation: 441 R Axis:   82 Text Interpretation: Sinus rhythm Left ventricular  hypertrophy since last tracing no significant change Confirmed by Daleen Bo [phone]) on 04/29/2020 10:09:35 PM      Labs Reviewed  COMPREHENSIVE METABOLIC PANEL - Abnormal; Notable for the following components:      Result Value   Sodium 128 (*)    Chloride 95 (*)    Glucose, Bld 107 (*)    AST 45 (*)    All other components within normal limits  CBC  TROPONIN I (HIGH SENSITIVITY)  TROPONIN I (HIGH SENSITIVITY)    DG Chest Port 1 View  Final Result      Medications  sodium chloride 0.9 % bolus 1,000 mL (1,000 mLs Intravenous New Bag/Given 04/30/20 0144)     Procedures  /  Critical Care Procedures  ED Course and Medical Decision Making  I have reviewed the triage vital signs, the nursing notes, and pertinent available records from the EMR.  Listed above are laboratory and imaging tests that I personally ordered, reviewed, and interpreted and then considered in my medical decision making (see below for details).  Considering ACS but favoring anxiety and/or intoxication/withdrawal symptoms.  Awaiting troponin.  PERC negative, doubt PE.     Patient feeling better, troponin negative x2, labs reveal hyponatremia.  Provided liter normal saline, suspect related to hypovolemia or [place] mania.Marland Kitchen  Appropriate for discharge.  Barth Kirks. [place] Small, [place] [email or web address]  Final Clinical Impressions(s) / ED Diagnoses     ICD-10-CM   1. Chest pain, unspecified type  R07.9   2. Hyponatremia  E87.1     ED Discharge Orders    None       Discharge Instructions Discussed with and Provided to Patient:     Discharge Instructions     You were evaluated in the Emergency Department and after careful evaluation, we did not find any emergent condition requiring admission or further testing in the hospital.  Your exam/testing today is overall reassuring.  No evidence of heart damage.  Symptoms may be related to dehydration or  low sodium levels.  We recommend cutting down on drinking and following up with a primary care doctor.  Please return to the Emergency Department if you experience any worsening of your condition.   Thank you for allowing [name] to be a part of your care.       Maudie Flakes, MD 04/30/20 [phone]

## 2020-04-30 LAB — TROPONIN I (HIGH SENSITIVITY)
Troponin I (High Sensitivity): 2 ng/L (ref <18)
Troponin I (High Sensitivity): <2 ng/L (ref <18)

## 2020-04-30 LAB — COMPREHENSIVE METABOLIC PANEL
ALT: 44 U/L (ref 0–44)
AST: 45 U/L — ABNORMAL HIGH (ref 15–41)
Albumin: 4.2 g/dL (ref 3.5–5.0)
Alkaline Phosphatase: 71 U/L (ref 38–126)
Anion gap: 10 (ref 5–15)
BUN: 13 mg/dL (ref 6–20)
CO2: 23 mmol/L (ref 22–32)
Calcium: 9.3 mg/dL (ref 8.9–10.3)
Chloride: 95 mmol/L — ABNORMAL LOW (ref 98–111)
Creatinine, Ser: 0.73 mg/dL (ref 0.61–1.24)
GFR, Estimated: >60 mL/min (ref >60)
Glucose, Bld: 107 mg/dL — ABNORMAL HIGH (ref 70–99)
Potassium: 4.1 mmol/L (ref 3.5–5.1)
Sodium: 128 mmol/L — ABNORMAL LOW (ref 135–145)
Total Bilirubin: 1.1 mg/dL (ref 0.3–1.2)
Total Protein: 7.7 g/dL (ref 6.5–8.1)

## 2020-04-30 LAB — CBC
HCT: 43.3 % (ref 39.0–52.0)
Hemoglobin: 14.9 g/dL (ref 13.0–17.0)
MCH: 32.3 pg (ref 26.0–34.0)
MCHC: 34.4 g/dL (ref 30.0–36.0)
MCV: 93.9 fL (ref 80.0–100.0)
Platelets: 287 10*3/uL (ref 150–400)
RBC: 4.61 MIL/uL (ref 4.22–5.81)
RDW: 12.2 % (ref 11.5–15.5)
WBC: 6.4 10*3/uL (ref 4.0–10.5)
nRBC: 0 % (ref 0.0–0.2)

## 2020-04-30 MED ORDER — SODIUM CHLORIDE 0.9 % IV BOLUS
1000.0000 mL | Freq: Once | INTRAVENOUS | Status: AC
  Administered 2020-04-30: 1000 mL via INTRAVENOUS

## 2020-04-30 NOTE — Discharge Instructions (Addendum)
You were evaluated in the Emergency Department and after careful evaluation, we did not find any emergent condition requiring admission or further testing in the hospital.  Your exam/testing today is overall reassuring.  No evidence of heart damage.  Symptoms may be related to dehydration or low sodium levels.  We recommend cutting down on drinking and following up with a primary care doctor.  Please return to the Emergency Department if you experience any worsening of your condition.   Thank you for allowing [name] to be a part of your care.

## 2020-04-30 NOTE — ED Notes (Signed)
Pt d/c . To lobby to call for the ride

## 2020-05-20 ENCOUNTER — Inpatient Hospital Stay: Payer: Self-pay | Admitting: Internal Medicine

## 2020-05-27 ENCOUNTER — Encounter: Payer: Self-pay | Admitting: Internal Medicine

## 2020-05-27 ENCOUNTER — Ambulatory Visit: Payer: Self-pay | Admitting: *Deleted

## 2020-05-27 ENCOUNTER — Other Ambulatory Visit: Payer: Self-pay

## 2020-05-27 ENCOUNTER — Ambulatory Visit: Payer: Self-pay | Attending: Internal Medicine | Admitting: Internal Medicine

## 2020-05-27 ENCOUNTER — Other Ambulatory Visit: Payer: Self-pay | Admitting: Internal Medicine

## 2020-05-27 ENCOUNTER — Ambulatory Visit (HOSPITAL_COMMUNITY)
Admission: RE | Admit: 2020-05-27 | Discharge: 2020-05-27 | Disposition: A | Discharge status: Home / Self Care | Payer: Self-pay | Source: Ambulatory Visit | Attending: Internal Medicine | Admitting: Internal Medicine

## 2020-05-27 VITALS — BP 130/86 | HR 89 | Temp 98.3°F | Resp 16 | Wt 153.2 lb

## 2020-05-27 DIAGNOSIS — Z2821 Immunization not carried out because of patient refusal: Secondary | ICD-10-CM | POA: Insufficient documentation

## 2020-05-27 DIAGNOSIS — N50812 Left testicular pain: Secondary | ICD-10-CM | POA: Insufficient documentation

## 2020-05-27 DIAGNOSIS — N50811 Right testicular pain: Secondary | ICD-10-CM

## 2020-05-27 DIAGNOSIS — R300 Dysuria: Secondary | ICD-10-CM

## 2020-05-27 DIAGNOSIS — R030 Elevated blood-pressure reading, without diagnosis of hypertension: Secondary | ICD-10-CM | POA: Insufficient documentation

## 2020-05-27 DIAGNOSIS — F1021 Alcohol dependence, in remission: Secondary | ICD-10-CM

## 2020-05-27 DIAGNOSIS — R9389 Abnormal findings on diagnostic imaging of other specified body structures: Secondary | ICD-10-CM

## 2020-05-27 DIAGNOSIS — F17200 Nicotine dependence, unspecified, uncomplicated: Secondary | ICD-10-CM

## 2020-05-27 LAB — POCT URINALYSIS DIP (CLINITEK)
Bilirubin, UA: NEGATIVE
Blood, UA: NEGATIVE
Glucose, UA: NEGATIVE mg/dL
Ketones, POC UA: NEGATIVE mg/dL
Leukocytes, UA: NEGATIVE
Nitrite, UA: NEGATIVE
POC PROTEIN,UA: NEGATIVE
Spec Grav, UA: 1.01 (ref 1.010–1.025)
Urobilinogen, UA: 0.2 E.U./dL
pH, UA: 6 (ref 5.0–8.0)

## 2020-05-27 MED ORDER — CIPROFLOXACIN HCL 500 MG PO TABS: 500.0000 mg | ORAL_TABLET | Freq: Two times a day (BID) | ORAL | 0 refills | Status: DC

## 2020-05-27 MED ORDER — NICOTINE POLACRILEX 2 MG MT GUM: 2.0000 mg | CHEWING_GUM | OROMUCOSAL | 1 refills | Status: DC | PRN

## 2020-05-27 MED ORDER — NICOTINE 14 MG/24HR TD PT24: 14.0000 mg | MEDICATED_PATCH | Freq: Every day | TRANSDERMAL | 1 refills | Status: DC

## 2020-05-27 MED ORDER — TRAMADOL HCL 50 MG PO TABS
50.0000 mg | ORAL_TABLET | Freq: Three times a day (TID) | ORAL | 0 refills | Status: DC | PRN
Start: 2020-05-27 — End: 2020-05-27

## 2020-05-27 MED FILL — CIPROFLOXACIN HCL 500 MG TA: 500 | 7 days supply | Qty: 14 | Fill #0

## 2020-05-27 MED FILL — NICOTINE 14 MG/24HR PATCH: 14 | 28 days supply | Qty: 28 | Fill #0

## 2020-05-27 MED FILL — traMADol HCL 50 MG TABS: 50 | 5 days supply | Qty: 15 | Fill #0

## 2020-05-27 NOTE — Progress Notes (Signed)
Patient ID: Brendan Ochoa, male    DOB: [birth year]  MRN: [id number]  CC: Hospitalization Follow-up   Subjective: Brendan Ochoa is a 45 y.o. male who presents for new pt  /ER f/u visit His concerns today include:  Patient with history of polysubstance abuse (cocaine and EtOH listed on the chart), Crohn's disease, GERD, tobacco dependence, asthma  No previous PCP.  Use to be seen at a free clinic in [place].  Last seen over 1 yr ago.  Dx with Crohn's 10 yrs ago by Dr. Renaldo Reel.  Has not seen him in about 10 yrs.  Last c-scope was about 2 yrs ago at [facility].  He reports no active flareup in quite a while.  Not on any medication for this.  Hx of polysubst abuse: reports mainly ETOH.  "I go back and forth many times with relapse."  Strong fhx of ETOH abuse in siblings and pt's father.  Currently in early remission.  Last drank 1.5-2 mths ago.  Went to [facility] and various detox programs a few times.  Last use cocaine over 1 yr ago. -currently in AA.  Reports has a good network of people for support  Seen in the emergency room the end of November.  Had chest x-ray that revealed questionable density in the lingula.  Repeat chest x-ray in 4 to 6 weeks was recommended.  Subsequently seen in the emergency room again the middle part of last month with chest pains.  Repeat chest x-ray no longer showed this abnormality.   Started smoking 22 yrs ago.  Was a pk a day. Quit about a dozen times in past.  Longest he quit for was 11 mths.  He used the patches and stayed busy.  Now at 7-8 cig/day sometimes a little more.  Wants to quit.  "I'm so tired of these cigarettes.  Not smelling them or not seeing them would help a lot."    BP elev today.Told in past yr a few times that BP was elev.  Hurts to urinate/dysuria and swelling in testicle x 2 wks No blood in urine. No fever.  No penile dischg. Last sexual activity was 7-8 mths ago and always used protection.   HM: decline flu shot. Had 1 shot of  Pfizer. Reports he felt flu like symptoms for 1 wk after taking the vaccine so did not go back for 2nd shot.   Past medical, surgical, family history reviewed. Patient Active Problem List   Diagnosis Date Noted  . AKI (acute kidney injury) (HCC) 01/01/2020  . Vomiting 01/01/2020  . Cocaine abuse (HCC) 01/01/2020  . Alcohol abuse 01/01/2020  . Ileitis 02/18/2018  . HYPERGLYCEMIA 11/01/2009  . ABNORMAL WEIGHT GAIN 06/15/2009  . TOBACCO ABUSE 07/18/2007  . SUBSTANCE ABUSE, MULTIPLE 07/18/2007  . DEPRESSION 07/18/2007  . ALLERGIC RHINITIS 07/18/2007  . ASTHMA 07/18/2007  . GERD 07/18/2007  . Crohn's disease (HCC) 07/18/2007     Current Outpatient Medications on File Prior to Visit  Medication Sig Dispense Refill  . acetaminophen (TYLENOL) 500 MG tablet Take 1,000 mg by mouth every 6 (six) hours as needed for headache. (Patient not taking: Reported on 05/27/2020)    . albuterol (VENTOLIN HFA) 108 (90 Base) MCG/ACT inhaler Inhale 2 puffs into the lungs every 6 (six) hours as needed for wheezing or shortness of breath. (Patient not taking: Reported on 05/27/2020) 8 g 2  . chlordiazePOXIDE (LIBRIUM) 25 MG capsule 50mg  PO TID x 1D, then 25-50mg  PO BID X 1D, then  25-50mg  PO QD X 1D (Patient not taking: Reported on 05/27/2020) 10 capsule 0  . ondansetron (ZOFRAN ODT) 4 MG disintegrating tablet Take 1 tablet (4 mg total) by mouth every 8 (eight) hours as needed for nausea or vomiting. (Patient not taking: Reported on 05/27/2020) 20 tablet 0  . Probiotic Product (PROBIOTIC PO) Take 1 tablet by mouth daily. (Patient not taking: Reported on 05/27/2020)     No current facility-administered medications on file prior to visit.    No Known Allergies  Social History   Socioeconomic History  . Marital status: Single    Spouse name: Not on file  . Number of children: Not on file  . Years of education: Not on file  . Highest education level: Not on file  Occupational History  . Not on file  Tobacco  Use  . Smoking status: Current Every Day Smoker    Packs/day: 0.25    Years: 15.00    Pack years: 3.75    Types: Cigarettes  . Smokeless tobacco: Never Used  . Tobacco comment: 1/4 to 1/2 pack per day   Vaping Use  . Vaping Use: Never used  Substance and Sexual Activity  . Alcohol use: Yes    Comment: occasionally  . Drug use: Yes    Types: Cocaine    Comment: last use 2 days ago  . Sexual activity: Not on file  Other Topics Concern  . Not on file  Social History Narrative  . Not on file   Social Determinants of Health   Financial Resource Strain: Not on file  Food Insecurity: Not on file  Transportation Needs: Not on file  Physical Activity: Not on file  Stress: Not on file  Social Connections: Not on file  Intimate Partner Violence: Not on file    Family History  Problem Relation Age of Onset  . Stomach cancer Maternal Grandfather   . Colon cancer Neg Hx     Past Surgical History:  Procedure Laterality Date  . BIOPSY  10/15/2017   Procedure: BIOPSY;  Surgeon: West Bali, MD;  Location: [facility];  Service: Endoscopy;;  terminal ileum colon  . COLONOSCOPY WITH PROPOFOL N/A 10/15/2017   redundant colon, normal rectum, external and internal hemorrhoids, quiescent crohn's. Pathology: inactive chronicn nonspecific ileitis  . ESOPHAGOGASTRODUODENOSCOPY (EGD) WITH PROPOFOL N/A 10/15/2017   normal esophagus, small hiatal hernia, gastritis, duodenitis  . FINGER AMPUTATION Right 2012   cut finger table saw and surgically amputated    ROS: Review of Systems Negative except as stated above  PHYSICAL EXAM: BP 130/86   Pulse 89   Temp 98.3 F (36.8 C)   Resp 16   Wt 153 lb 3.2 oz (69.5 kg)   SpO2 96%   BMI 21.37 kg/m   Physical Exam  General appearance - alert, well appearing, and in no distress.  However as the exam progressed, patient complained of significant pain in the groin area Mental status - normal mood, behavior, speech, dress, motor activity, and  thought processes Eyes - pupils equal and reactive, extraocular eye movements intact Chest - clear to auscultation, no wheezes, rales or rhonchi, symmetric air entry Heart - normal rate, regular rhythm, normal S1, S2, no murmurs, rubs, clicks or gallops Abdomen -normal bowel sounds.  Nondistended.  Soft.  Slight suprapubic tenderness without guarding or rebound.   GU Male - CMA B Debarah Crape present.  No inguinal lymphadenopathy appreciated.  Testicular sac has mild erythema but no significant edema noted of the  sac.  He has mild to moderate tenderness on palpation of the posterior aspect of both testicles . no penile discharge noted.  Patient attempted to do self swab but was too uncomfortable so we sent urine instead. Depression screen [facility] 2/9 05/27/2020 09/04/2017 07/23/2017  Decreased Interest 1 0 0  Down, Depressed, Hopeless 1 0 0  PHQ - 2 Score 2 0 0  Altered sleeping 2 - -  Tired, decreased energy 2 - -  Change in appetite 2 - -  Feeling bad or failure about yourself  1 - -  Trouble concentrating 1 - -  Moving slowly or fidgety/restless 1 - -  Suicidal thoughts 0 - -  PHQ-9 Score 11 - -   GAD 7 : Generalized Anxiety Score 05/27/2020  Nervous, Anxious, on Edge 1  Control/stop worrying 1  Worry too much - different things 1  Trouble relaxing 2  Restless 2  Easily annoyed or irritable 1  Afraid - awful might happen 1  Total GAD 7 Score 9     CMP Latest Ref Rng & Units 04/29/2020 04/10/2020 01/02/2020  Glucose 70 - 99 mg/dL 315(V) 761(Y) 93  BUN 6 - 20 mg/dL 13 12 07(P)  Creatinine 0.61 - 1.24 mg/dL 7.10 6.26 9.48  Sodium 135 - 145 mmol/L 128(L) 139 135  Potassium 3.5 - 5.1 mmol/L 4.1 3.8 3.3(L)  Chloride 98 - 111 mmol/L 95(L) 102 102  CO2 22 - 32 mmol/L 23 23 24   Calcium 8.9 - 10.3 mg/dL 9.3 ) 8.3(L)  Total Protein 6.5 - 8.1 g/dL 7.7 - 6.2(L)  Total Bilirubin 0.3 - 1.2 mg/dL 1.1 - 2.4(H)  Alkaline Phos 38 - 126 U/L 71 - 50  AST 15 - 41 U/L 45(H) - 37  ALT 0 - 44 U/L 44 - 25    Lipid Panel     Component Value Date/Time   CHOL 178 04/19/2017 0856   TRIG 124 04/19/2017 0856   HDL 65 04/19/2017 0856   CHOLHDL 2.7 04/19/2017 0856   VLDL 25 04/19/2017 0856   LDLCALC 88 04/19/2017 0856    CBC    Component Value Date/Time   WBC 6.4 04/29/2020 2340   RBC 4.61 04/29/2020 2340   HGB 14.9 04/29/2020 2340   HCT 43.3 04/29/2020 2340   PLT 287 04/29/2020 2340   MCV 93.9 04/29/2020 2340   MCH 32.3 04/29/2020 2340   MCHC 34.4 04/29/2020 2340   RDW 12.2 04/29/2020 2340   LYMPHSABS 1.2 01/01/2020 0534   MONOABS 1.1 (H) 01/01/2020 0534   EOSABS 0.0 01/01/2020 0534   BASOSABS 0.1 01/01/2020 0534   Results for orders placed or performed in visit on 05/27/20  POCT URINALYSIS DIP (CLINITEK)  Result Value Ref Range   Color, UA yellow yellow   Clarity, UA clear clear   Glucose, UA negative negative mg/dL   Bilirubin, UA negative negative   Ketones, POC UA negative negative mg/dL   Spec Grav, UA 05/29/20 2.703 - 1.025   Blood, UA negative negative   pH, UA 6.0 5.0 - 8.0   POC PROTEIN,UA negative negative, trace   Urobilinogen, UA 0.2 0.2 or 1.0 E.U./dL   Nitrite, UA Negative Negative   Leukocytes, UA Negative Negative    ASSESSMENT AND PLAN: 1. Pain in both testicles 2. Dysuria Differential diagnosis includes epididymitis, STD, testicular torsion.  UTI has been ruled out with UA. -Started on ciprofloxacin twice daily for 7 days.  Send urine for microscopy and culture.  Screen for STDs.  Testicular ultrasound ordered.  Referral to urology. - ciprofloxacin (CIPRO) 500 MG tablet; Take 1 tablet (500 mg total) by mouth 2 (two) times daily.  Dispense: 14 tablet; Refill: 0 - Urine cytology ancillary only - US SCROTUM W/DOPPLER; Future - ciprofloxacin (CIPRO) 500 MG tablet; Take 1 tablet (500 mg total) by mouth 2 (two) times daily.  Dispense: 14 tablet; Refill: 0 - POCT URINALYSIS DIP (CLINITEK) - Urinalysis, Routine w reflex microscopic - Urine Culture - Urine  cytology ancillary only  3. Tobacco dependence Advised to quit.  Health risk associated with smoking discussed.  Patient wanting to quit.  We discussed methods to help him quit.  He would like to try the nicotine patches and gum again.  Prescription sent to the pharmacy.  We went over how to use the patches.  He prefers to start at the intermediate dose.  Less than 5 minutes spent on counseling. - nicotine (NICODERM CQ - DOSED IN MG/24 HOURS) 14 mg/24hr patch; Place 1 patch (14 mg total) onto the skin daily.  Dispense: 28 patch; Refill: 1 - nicotine polacrilex (NICORETTE) 2 MG gum; Take 1 each (2 mg total) by mouth as needed for smoking cessation.  Dispense: 100 tablet; Refill: 1  4. Abnormal chest x-ray Abnormal finding seen on chest x-ray in November did not persist on repeat chest x-ray in December.  However the one done in December was a 1 view x-ray.  I did not realize this until after the patient had left.  On his follow-up visit we will discuss perhaps sending him for another x-ray 2 views.  Strongly encourage him to discontinue smoking.  5. Alcohol use disorder, severe, in early remission (HCC) Commended him on quitting.  Encouraged him to continue going to AA meetings to try to maintain his sobriety.  6. Elevated blood pressure reading without diagnosis of hypertension DASH diet discussed and encouraged.  7. Influenza vaccination declined Recommended.  Patient declined.  As we were finishing up patient reported some weight loss.  I told him we will take this up on follow-up visit.  Patient was given the opportunity to ask questions.  Patient verbalized understanding of the plan and was able to repeat key elements of the plan.   Orders Placed This Encounter  Procedures  . Urine Culture  . US SCROTUM W/DOPPLER  . Urinalysis, Routine w reflex microscopic  . POCT URINALYSIS DIP (CLINITEK)     Requested Prescriptions   Signed Prescriptions Disp Refills  . nicotine (NICODERM CQ  - DOSED IN MG/24 HOURS) 14 mg/24hr patch 28 patch 1    Sig: Place 1 patch (14 mg total) onto the skin daily.  . nicotine polacrilex (NICORETTE) 2 MG gum 100 tablet 1    Sig: Take 1 each (2 mg total) by mouth as needed for smoking cessation.  . ciprofloxacin (CIPRO) 500 MG tablet 14 tablet 0    Sig: Take 1 tablet (500 mg total) by mouth 2 (two) times daily.  . traMADol (ULTRAM) 50 MG tablet 15 tablet 0    Sig: Take 1 tablet (50 mg total) by mouth every 8 (eight) hours as needed for up to 5 days.    Return in about 4 weeks (around 06/24/2020).  Jonah Blue, MD, FACP

## 2020-05-27 NOTE — Telephone Encounter (Signed)
Will forward to pcp

## 2020-05-27 NOTE — Telephone Encounter (Signed)
I reviewed the results of the ultrasound of the scrotum that was done today.  Phone call placed to patient tonight.  Patient informed that the ultrasound did not show torsion but did confirm diagnosis of epididymitis.  Also showed cystic lesion in the head of the left epididymis which could be a cyst or an abscess.  Radiologist thinks it favors a cyst.  I explained to him that epididymitis is an infection of the epididymis track caused by bacteria.  Can be sexually transmitted bacteria or bacteria that got to the epididymis from the prostate or the bladder.  Patient maintains that he has not been sexually active in the past 7 to 8 months.  In that case I told him that the antibiotic that he is on call Cipro should give appropriate coverage for the most common organisms which are usually gram-negative's like E. coli.  He did pick up the antibiotic today.  He has already taken his first dose.  I had prescribed a 7-day course..  I told him that I will send a prescription to the pharmacy for an additional 4 days so that he is on the antibiotic for a total of 11 days.  Advised that if symptoms do not improve within 48 hours or pain gets worse or he develops a fever, he should be seen in the emergency room as he may need intravenous antibiotics.  I request that he follows up with me in about 2 to 4 weeks at which time we will repeat the ultrasound of the scrotum to make sure that cysts/abscess has resolved.  Patient expressed understanding of the plan and was able to repeat it back to me.

## 2020-05-27 NOTE — Telephone Encounter (Signed)
Mendel Ryder with [facility] ultrasound calling in a report. U/S of the testicles. Result is in Epic.    I called [place] and [facility] and let the flow coordinator know about the result.   She's going to get it high priority to the nurse.

## 2020-05-27 NOTE — Telephone Encounter (Signed)
Erline Levine from the radiology department called to give out STAT results. Erline Levine can be reached at [phone].

## 2020-05-27 NOTE — Addendum Note (Signed)
Addended by: Karle Plumber B on: 05/27/2020 10:13 PM   Modules accepted: Orders

## 2020-05-27 NOTE — Patient Instructions (Signed)
We have sent urine culture on you.  We have also sent cultures to screen for sexually transmitted infection including chlamydia and gonorrhea.  We will let you know if these come back positive.  In the meantime I have started you on an antibiotic called ciprofloxacin that you will take twice a day for 7 days.  The prescription has been sent to her pharmacy.  We will send you over to the hospital radiology department today to get an ultrasound of the testicles done.  I have also submitted a referral for you to see the urologist.

## 2020-05-28 LAB — URINALYSIS, ROUTINE W REFLEX MICROSCOPIC
Bilirubin, UA: NEGATIVE
Glucose, UA: NEGATIVE
Ketones, UA: NEGATIVE
Leukocytes,UA: NEGATIVE
Nitrite, UA: NEGATIVE
Protein,UA: NEGATIVE
RBC, UA: NEGATIVE
Specific Gravity, UA: 1.006 (ref 1.005–1.030)
Urobilinogen, Ur: 0.2 mg/dL (ref 0.2–1.0)
pH, UA: 6.5 (ref 5.0–7.5)

## 2020-05-29 LAB — URINE CULTURE: Organism ID, Bacteria: NO GROWTH

## 2020-05-29 NOTE — Progress Notes (Signed)
Let patient know that urinalysis and urine culture came back negative for any growth of bacteria.  Please inquire whether or not his symptoms are better with the antibiotic.

## 2020-05-30 ENCOUNTER — Telehealth: Payer: Self-pay

## 2020-05-30 ENCOUNTER — Telehealth: Payer: Self-pay | Admitting: *Deleted

## 2020-05-30 LAB — URINE CYTOLOGY ANCILLARY ONLY
Bacterial Vaginitis-Urine: NEGATIVE
Candida Urine: NEGATIVE
Chlamydia: NEGATIVE
Comment: NEGATIVE
Comment: NEGATIVE
Comment: NORMAL
Neisseria Gonorrhea: NEGATIVE
Trichomonas: NEGATIVE

## 2020-05-30 NOTE — Telephone Encounter (Signed)
[facility] is calling to report [name] results are in chart- please advised PCP

## 2020-05-30 NOTE — Telephone Encounter (Signed)
Contacted pt to go over lab results pt didn't answer lvm  

## 2020-05-30 NOTE — Telephone Encounter (Signed)
See note 05/27/20

## 2020-05-30 NOTE — Telephone Encounter (Signed)
Dr. Wynetta Emery is aware of results and has already reached out to pt

## 2020-05-30 NOTE — Telephone Encounter (Signed)
Pt returned call. Pt is aware of urine results. Pt states the antibiotics are working for him. Pt has been scheduled a 2 week f/u

## 2020-06-09 ENCOUNTER — Other Ambulatory Visit: Payer: Self-pay | Admitting: Internal Medicine

## 2020-06-09 ENCOUNTER — Telehealth: Payer: Self-pay | Admitting: Internal Medicine

## 2020-06-09 MED ORDER — CIPROFLOXACIN HCL 500 MG PO TABS: 500.0000 mg | ORAL_TABLET | Freq: Two times a day (BID) | ORAL | 0 refills | Status: DC

## 2020-06-09 NOTE — Telephone Encounter (Signed)
Will forward to pcp

## 2020-06-09 NOTE — Telephone Encounter (Signed)
Copied from [place] [id number]. Topic: General - Other >> Jun 08, 2020 12:48 PM Celene Kras wrote: Reason for CRM: Pt called stating that he was on an antibiotic for his kidney infection. He states that he has finished it, but that he was in a lot of pain last night. He states that this morning however he is not having barely any pain. Pt is requesting to know what to do. Please advise. e

## 2020-06-10 MED FILL — CIPROFLOXACIN HCL 500 MG TA: 500 | 3 days supply | Qty: 6 | Fill #0

## 2020-06-10 NOTE — Telephone Encounter (Signed)
Returned pt call to go over Dr. Wynetta Emery response pt didn't answer. Left a detailed vm and if he has any questions or concerns to give a call

## 2020-06-14 ENCOUNTER — Other Ambulatory Visit: Payer: Self-pay

## 2020-06-14 ENCOUNTER — Ambulatory Visit: Payer: Self-pay | Attending: Internal Medicine | Admitting: Internal Medicine

## 2020-06-14 ENCOUNTER — Encounter: Payer: Self-pay | Admitting: Internal Medicine

## 2020-06-14 VITALS — BP 121/80 | HR 84 | Resp 16 | Wt 155.6 lb

## 2020-06-14 DIAGNOSIS — R634 Abnormal weight loss: Secondary | ICD-10-CM | POA: Insufficient documentation

## 2020-06-14 DIAGNOSIS — K5090 Crohn's disease, unspecified, without complications: Secondary | ICD-10-CM

## 2020-06-14 DIAGNOSIS — N451 Epididymitis: Secondary | ICD-10-CM

## 2020-06-14 DIAGNOSIS — R5383 Other fatigue: Secondary | ICD-10-CM

## 2020-06-14 DIAGNOSIS — N503 Cyst of epididymis: Secondary | ICD-10-CM | POA: Insufficient documentation

## 2020-06-14 DIAGNOSIS — R9389 Abnormal findings on diagnostic imaging of other specified body structures: Secondary | ICD-10-CM

## 2020-06-14 DIAGNOSIS — F17200 Nicotine dependence, unspecified, uncomplicated: Secondary | ICD-10-CM

## 2020-06-14 NOTE — Patient Instructions (Signed)
When you go to radiology to have the follow-up ultrasound on the scrotum, please let them know that I have also ordered chest x-ray on you so that it can be done at the same time.

## 2020-06-14 NOTE — Progress Notes (Signed)
Patient ID: Brendan Ochoa, male    DOB: May 15, 1975  MRN: [id number]  CC: Follow-up (2 week )   Subjective: Brendan Ochoa is a 45 y.o. male who presents for 2 wks follow up His concerns today include:  Patient with history of polysubstance abuse (cocaine and EtOH listed on the chart), Crohn's disease, GERD, tobacco dependence, asthma  On last visit, he complained of pain and swelling in the scrotum.  Found to have epididymitis and was placed on ciprofloxacin.  Scrotal ultrasound findings are below: IMPRESSION: 1. Normal testes.  No evidence of orchitis or torsion. 2. Bilateral enlarged hyperemic epididymi. Findings most suggestive of bilateral epididymitis. 3. Cystic lesion in the head of the LEFT epididymis is favored benign cystic collection. Cannot exclude abscess but less favored. Consider antibiotic treatment and follow-up ultrasound with symptoms Resolve.  Doing better.  Pain once on left side that went away after we extended abx for 3 additional days for total of 2 wks. . Swelling went down, no fever.   Seen in the emergency room the end of November.  Had chest x-ray that revealed questionable density in the lingula.  Repeat chest x-ray in 4 to 6 weeks was recommended.  Subsequently seen in the emergency room again the middle part of last month with chest pains.  Repeat chest x-ray no longer showed this abnormality but it was only a one view study.  I told him that it is best for [name] to go ahead and do the two views.  Tob dep:did get the patches as prescribed on last visit. Has gone 4 days without smoking.  However, he admits to strong cravings.  Wgh loss:  Usually 165-170 lbs. Loss wgh since November of last year.  Sometimes he gets real tired with decrease strength and energy.  Requesting to have testosterone level checked.  Sleeping ok.  No flare of Crohn's disease No chronic cough. Appetite comes and goes.  Better over last few days.  Feels nicotine patches curbs  appetite for him Free of stree drugs for over a year and ETOH for about 2 months.   Patient Active Problem List   Diagnosis Date Noted   Elevated blood pressure reading without diagnosis of hypertension 05/27/2020   Influenza vaccination declined 05/27/2020   AKI (acute kidney injury) ([place]) 01/01/2020   Vomiting 01/01/2020   Cocaine abuse ([place]) 01/01/2020   Alcohol abuse 01/01/2020   Ileitis 02/18/2018   HYPERGLYCEMIA 11/01/2009   ABNORMAL WEIGHT GAIN 06/15/2009   Tobacco dependence 07/18/2007   SUBSTANCE ABUSE, MULTIPLE 07/18/2007   DEPRESSION 07/18/2007   ALLERGIC RHINITIS 07/18/2007   ASTHMA 07/18/2007   GERD 07/18/2007   Crohn's disease ([place]) 07/18/2007     Current Outpatient Medications on File Prior to Visit  Medication Sig Dispense Refill   acetaminophen (TYLENOL) 500 MG tablet Take 1,000 mg by mouth every 6 (six) hours as needed for headache. (Patient not taking: No sig reported)     albuterol (VENTOLIN HFA) 108 (90 Base) MCG/ACT inhaler Inhale 2 puffs into the lungs every 6 (six) hours as needed for wheezing or shortness of breath. (Patient not taking: No sig reported) 8 g 2   chlordiazePOXIDE (LIBRIUM) 25 MG capsule 48m PO TID x 1D, then 25-549mPO BID X 1D, then 25-5027mO QD X 1D (Patient not taking: No sig reported) 10 capsule 0   ciprofloxacin (CIPRO) 500 MG tablet Take 1 tablet (500 mg total) by mouth 2 (two) times daily. (Patient not taking: Reported on 06/14/2020)  14 tablet 0   ciprofloxacin (CIPRO) 500 MG tablet Take 1 tablet (500 mg total) by mouth 2 (two) times daily. (Patient not taking: Reported on 06/14/2020) 8 tablet 0   ciprofloxacin (CIPRO) 500 MG tablet Take 1 tablet (500 mg total) by mouth 2 (two) times daily. (Patient not taking: Reported on 06/14/2020) 6 tablet 0   nicotine (NICODERM CQ - DOSED IN MG/24 HOURS) 14 mg/24hr patch Place 1 patch (14 mg total) onto the skin daily. (Patient not taking: Reported on 06/14/2020) 28 patch 1    nicotine polacrilex (NICORETTE) 2 MG gum Take 1 each (2 mg total) by mouth as needed for smoking cessation. (Patient not taking: Reported on 06/14/2020) 100 tablet 1   ondansetron (ZOFRAN ODT) 4 MG disintegrating tablet Take 1 tablet (4 mg total) by mouth every 8 (eight) hours as needed for nausea or vomiting. (Patient not taking: No sig reported) 20 tablet 0   Probiotic Product (PROBIOTIC PO) Take 1 tablet by mouth daily. (Patient not taking: No sig reported)     No current facility-administered medications on file prior to visit.    No Known Allergies  Social History   Socioeconomic History   Marital status: Single    Spouse name: Not on file   Number of children: Not on file   Years of education: Not on file   Highest education level: Not on file  Occupational History   Not on file  Tobacco Use   Smoking status: Current Every Day Smoker    Packs/day: 0.25    Years: 15.00    Pack years: 3.75    Types: Cigarettes   Smokeless tobacco: Never Used   Tobacco comment: 1/4 to 1/2 pack per day   Vaping Use   Vaping Use: Never used  Substance and Sexual Activity   Alcohol use: Yes    Comment: occasionally   Drug use: Yes    Types: Cocaine    Comment: last use 2 days ago   Sexual activity: Not on file  Other Topics Concern   Not on file  Social History Narrative   Not on file   Social Determinants of Health   Financial Resource Strain: Not on file  Food Insecurity: Not on file  Transportation Needs: Not on file  Physical Activity: Not on file  Stress: Not on file  Social Connections: Not on file  Intimate Partner Violence: Not on file    Family History  Problem Relation Age of Onset   Stomach cancer Maternal Grandfather    Colon cancer Neg Hx     Past Surgical History:  Procedure Laterality Date   BIOPSY  10/15/2017   Procedure: BIOPSY;  Surgeon: Danie Binder, MD;  Location[facility];  Service: Endoscopy;;  terminal ileum colon    COLONOSCOPY WITH PROPOFOL N/A 10/15/2017   redundant colon, normal rectum, external and internal hemorrhoids, quiescent crohn's. Pathology: inactive chronicn nonspecific ileitis   ESOPHAGOGASTRODUODENOSCOPY (EGD) WITH PROPOFOL N/A 10/15/2017   normal esophagus, small hiatal hernia, gastritis, duodenitis   FINGER AMPUTATION Right 2012   cut finger table saw and surgically amputated    ROS: Review of Systems Negative except as stated above  PHYSICAL EXAM: BP 121/80    Pulse 84    Resp 16    Wt 155 lb 9.6 oz (70.6 kg)    SpO2 95%    BMI 21.70 kg/m   Wt Readings from Last 3 Encounters:  06/14/20 155 lb 9.6 oz (70.6 kg)  05/27/20 153  lb 3.2 oz (69.5 kg)  04/29/20 165 lb (74.8 kg)    Physical Exam  General appearance - alert, well appearing, young to middle-aged Caucasian male and in no distress Mental status - normal mood, behavior, speech, dress, motor activity, and thought processes Nose - normal and patent, no erythema, discharge or polyps Mouth - mucous membranes moist, pharynx normal without lesions Neck - supple, no significant adenopathy Chest - clear to auscultation, no wheezes, rales or rhonchi, symmetric air entry Heart - normal rate, regular rhythm, normal S1, S2, no murmurs, rubs, clicks or gallops Abdomen - soft, nontender, nondistended, no masses or organomegaly Extremities - peripheral pulses normal, no pedal edema, no clubbing or cyanosis  CMP Latest Ref Rng & Units 04/29/2020 04/10/2020 01/02/2020  Glucose 70 - 99 mg/dL 107(H) 110(H) 93  BUN 6 - 20 mg/dL 13 12 21(H)  Creatinine 0.61 - 1.24 mg/dL 0.73 0.76 0.81  Sodium 135 - 145 mmol/L 128(L) 139 135  Potassium 3.5 - 5.1 mmol/L 4.1 3.8 3.3(L)  Chloride 98 - 111 mmol/L 95(L) 102 102  CO2 22 - 32 mmol/L 23 23 24   Calcium 8.9 - 10.3 mg/dL 9.3 8.6(L) 8.3(L)  Total Protein 6.5 - 8.1 g/dL 7.7 - 6.2(L)  Total Bilirubin 0.3 - 1.2 mg/dL 1.1 - 2.4(H)  Alkaline Phos 38 - 126 U/L 71 - 50  AST 15 - 41 U/L 45(H) - 37  ALT 0 -  44 U/L 44 - 25   Lipid Panel     Component Value Date/Time   CHOL 178 04/19/2017 0856   TRIG 124 04/19/2017 0856   HDL 65 04/19/2017 0856   CHOLHDL 2.7 04/19/2017 0856   VLDL 25 04/19/2017 0856   LDLCALC 88 04/19/2017 0856    CBC    Component Value Date/Time   WBC 6.4 04/29/2020 2340   RBC 4.61 04/29/2020 2340   HGB 14.9 04/29/2020 2340   HCT 43.3 04/29/2020 2340   PLT 287 04/29/2020 2340   MCV 93.9 04/29/2020 2340   MCH 32.3 04/29/2020 2340   MCHC 34.4 04/29/2020 2340   RDW 12.2 04/29/2020 2340   LYMPHSABS 1.2 01/01/2020 0534   MONOABS 1.1 (H) 01/01/2020 0534   EOSABS 0.0 01/01/2020 0534   BASOSABS 0.1 01/01/2020 0534    ASSESSMENT AND PLAN: 1. Epididymitis 2. Epididymal cyst Patient doing much better post antibiotics.  However we will do a follow-up ultrasound to check on the cystic lesion seen in the head of the left epididymis. - US SCROTUM W/DOPPLER; Future  3. Unintentional weight loss He has had weight loss but weight has stayed stable since I last saw him 2 weeks ago.  We will do some baseline labs. - TSH - HIV Antibody (routine testing w rflx) - Hemoglobin A1c  4. Tobacco dependence Commended him on taking that first step in trying to quit by using the nicotine patches.  He will check with the pharmacy today to see if they have the nicotine gum.  I told him that using the gum will help in further decreasing his cravings.  Less than 5 minutes spent on counseling.  5. Crohn's disease without complication, unspecified gastrointestinal tract location ([place]) Stable without recent flare  6. Other fatigue - Testosterone - VITAMIN D 25 Hydroxy (Vit-D Deficiency, Fractures)  7. Abnormal chest x-ray - DG Chest 2 View; Future   Patient was given the opportunity to ask questions.  Patient verbalized understanding of the plan and was able to repeat key elements of the plan.  No orders of the defined types were placed in this encounter.    Requested  Prescriptions    No prescriptions requested or ordered in this encounter    No follow-ups on file.  Karle Plumber, MD, FACP

## 2020-06-15 ENCOUNTER — Telehealth: Payer: Self-pay

## 2020-06-15 ENCOUNTER — Other Ambulatory Visit: Payer: Self-pay | Admitting: Internal Medicine

## 2020-06-15 DIAGNOSIS — R7989 Other specified abnormal findings of blood chemistry: Secondary | ICD-10-CM

## 2020-06-15 LAB — HIV ANTIBODY (ROUTINE TESTING W REFLEX): HIV Screen 4th Generation wRfx: NONREACTIVE

## 2020-06-15 LAB — HEMOGLOBIN A1C
Est. average glucose Bld gHb Est-mCnc: 108 mg/dL
Hgb A1c MFr Bld: 5.4 % (ref 4.8–5.6)

## 2020-06-15 LAB — TSH: TSH: 2.44 u[IU]/mL (ref 0.450–4.500)

## 2020-06-15 LAB — TESTOSTERONE: Testosterone: 8 ng/dL — ABNORMAL LOW (ref 264–916)

## 2020-06-15 LAB — VITAMIN D 25 HYDROXY (VIT D DEFICIENCY, FRACTURES): Vit D, 25-Hydroxy: 53.8 ng/mL (ref 30.0–100.0)

## 2020-06-15 NOTE — Telephone Encounter (Signed)
Contacted pt to go over lab results pt is aware and doesn't have any questions or concerns 

## 2020-06-17 ENCOUNTER — Other Ambulatory Visit: Payer: Self-pay

## 2020-06-17 ENCOUNTER — Ambulatory Visit (HOSPITAL_COMMUNITY)
Admission: RE | Admit: 2020-06-17 | Discharge: 2020-06-17 | Disposition: A | Discharge status: Home / Self Care | Payer: Self-pay | Source: Ambulatory Visit | Attending: Internal Medicine | Admitting: Internal Medicine

## 2020-06-17 DIAGNOSIS — R9389 Abnormal findings on diagnostic imaging of other specified body structures: Secondary | ICD-10-CM | POA: Insufficient documentation

## 2020-06-17 DIAGNOSIS — N503 Cyst of epididymis: Secondary | ICD-10-CM | POA: Insufficient documentation

## 2020-07-28 ENCOUNTER — Other Ambulatory Visit: Payer: Self-pay

## 2020-07-28 ENCOUNTER — Inpatient Hospital Stay (HOSPITAL_COMMUNITY)
Admission: EM | Admit: 2020-07-28 | Discharge: 2020-08-02 | DRG: 896 | Disposition: A | Discharge status: Home / Self Care | Payer: Self-pay | Attending: Internal Medicine | Admitting: Internal Medicine

## 2020-07-28 DIAGNOSIS — M6282 Rhabdomyolysis: Secondary | ICD-10-CM | POA: Diagnosis present

## 2020-07-28 DIAGNOSIS — G929 Unspecified toxic encephalopathy: Secondary | ICD-10-CM | POA: Diagnosis present

## 2020-07-28 DIAGNOSIS — F09 Unspecified mental disorder due to known physiological condition: Secondary | ICD-10-CM | POA: Diagnosis present

## 2020-07-28 DIAGNOSIS — E876 Hypokalemia: Secondary | ICD-10-CM | POA: Diagnosis present

## 2020-07-28 DIAGNOSIS — R45851 Suicidal ideations: Secondary | ICD-10-CM | POA: Diagnosis present

## 2020-07-28 DIAGNOSIS — F17210 Nicotine dependence, cigarettes, uncomplicated: Secondary | ICD-10-CM | POA: Diagnosis present

## 2020-07-28 DIAGNOSIS — L03113 Cellulitis of right upper limb: Secondary | ICD-10-CM | POA: Diagnosis present

## 2020-07-28 DIAGNOSIS — J45909 Unspecified asthma, uncomplicated: Secondary | ICD-10-CM | POA: Diagnosis present

## 2020-07-28 DIAGNOSIS — F1910 Other psychoactive substance abuse, uncomplicated: Secondary | ICD-10-CM

## 2020-07-28 DIAGNOSIS — Z720 Tobacco use: Secondary | ICD-10-CM

## 2020-07-28 DIAGNOSIS — Z89021 Acquired absence of right finger(s): Secondary | ICD-10-CM

## 2020-07-28 DIAGNOSIS — Z79899 Other long term (current) drug therapy: Secondary | ICD-10-CM

## 2020-07-28 DIAGNOSIS — Z20822 Contact with and (suspected) exposure to covid-19: Secondary | ICD-10-CM | POA: Diagnosis present

## 2020-07-28 DIAGNOSIS — K5090 Crohn's disease, unspecified, without complications: Secondary | ICD-10-CM | POA: Diagnosis present

## 2020-07-28 DIAGNOSIS — G9340 Encephalopathy, unspecified: Secondary | ICD-10-CM

## 2020-07-28 DIAGNOSIS — K219 Gastro-esophageal reflux disease without esophagitis: Secondary | ICD-10-CM | POA: Diagnosis present

## 2020-07-28 DIAGNOSIS — F15959 Other stimulant use, unspecified with stimulant-induced psychotic disorder, unspecified: Principal | ICD-10-CM | POA: Diagnosis present

## 2020-07-28 DIAGNOSIS — Z781 Physical restraint status: Secondary | ICD-10-CM

## 2020-07-28 DIAGNOSIS — F19988 Other psychoactive substance use, unspecified with other psychoactive substance-induced disorder: Secondary | ICD-10-CM

## 2020-07-28 LAB — COMPREHENSIVE METABOLIC PANEL
ALT: 18 U/L (ref 0–44)
AST: 24 U/L (ref 15–41)
Albumin: 4.3 g/dL (ref 3.5–5.0)
Alkaline Phosphatase: 68 U/L (ref 38–126)
Anion gap: 13 (ref 5–15)
BUN: 9 mg/dL (ref 6–20)
CO2: 19 mmol/L — ABNORMAL LOW (ref 22–32)
Calcium: 9.3 mg/dL (ref 8.9–10.3)
Chloride: 102 mmol/L (ref 98–111)
Creatinine, Ser: 0.81 mg/dL (ref 0.61–1.24)
GFR, Estimated: >60 mL/min (ref >60)
Glucose, Bld: 103 mg/dL — ABNORMAL HIGH (ref 70–99)
Potassium: 3.7 mmol/L (ref 3.5–5.1)
Sodium: 134 mmol/L — ABNORMAL LOW (ref 135–145)
Total Bilirubin: 1.1 mg/dL (ref 0.3–1.2)
Total Protein: 7.6 g/dL (ref 6.5–8.1)

## 2020-07-28 LAB — CBC WITH DIFFERENTIAL/PLATELET
Abs Immature Granulocytes: 0.01 10*3/uL (ref 0.00–0.07)
Basophils Absolute: 0 10*3/uL (ref 0.0–0.1)
Basophils Relative: 1 %
Eosinophils Absolute: 0 10*3/uL (ref 0.0–0.5)
Eosinophils Relative: 0 %
HCT: 44.6 % (ref 39.0–52.0)
Hemoglobin: 15.2 g/dL (ref 13.0–17.0)
Immature Granulocytes: 0 %
Lymphocytes Relative: 21 %
Lymphs Abs: 1.8 10*3/uL (ref 0.7–4.0)
MCH: 30.2 pg (ref 26.0–34.0)
MCHC: 34.1 g/dL (ref 30.0–36.0)
MCV: 88.7 fL (ref 80.0–100.0)
Monocytes Absolute: 0.8 10*3/uL (ref 0.1–1.0)
Monocytes Relative: 10 %
Neutro Abs: 5.7 10*3/uL (ref 1.7–7.7)
Neutrophils Relative %: 68 %
Platelets: 339 10*3/uL (ref 150–400)
RBC: 5.03 MIL/uL (ref 4.22–5.81)
RDW: 12.2 % (ref 11.5–15.5)
WBC: 8.4 10*3/uL (ref 4.0–10.5)
nRBC: 0 % (ref 0.0–0.2)

## 2020-07-28 LAB — RESP PANEL BY RT-PCR (FLU A&B, COVID) ARPGX2
Influenza A by PCR: NEGATIVE
Influenza B by PCR: NEGATIVE
SARS Coronavirus 2 by RT PCR: NEGATIVE

## 2020-07-28 LAB — RAPID URINE DRUG SCREEN, HOSP PERFORMED
Amphetamines: POSITIVE — AB
Barbiturates: NOT DETECTED
Benzodiazepines: NOT DETECTED
Cocaine: POSITIVE — AB
Opiates: NOT DETECTED
Tetrahydrocannabinol: POSITIVE — AB

## 2020-07-28 LAB — SALICYLATE LEVEL: Salicylate Lvl: <7 mg/dL — ABNORMAL LOW (ref 7.0–30.0)

## 2020-07-28 LAB — ETHANOL: Alcohol, Ethyl (B): <10 mg/dL (ref <10)

## 2020-07-28 LAB — ACETAMINOPHEN LEVEL: Acetaminophen (Tylenol), Serum: <10 ug/mL — ABNORMAL LOW (ref 10–30)

## 2020-07-28 MED ORDER — THIAMINE HCL 100 MG PO TABS
100.0000 mg | ORAL_TABLET | Freq: Every day | ORAL | Status: DC
  Administered 2020-07-28: 100 mg via ORAL
  Filled 2020-07-28: qty 1

## 2020-07-28 MED ORDER — ONDANSETRON HCL 4 MG PO TABS: 4.0000 mg | ORAL_TABLET | Freq: Three times a day (TID) | ORAL | Status: DC | PRN

## 2020-07-28 MED ORDER — LORAZEPAM 1 MG PO TABS: 0.0000 mg | ORAL_TABLET | Freq: Two times a day (BID) | ORAL | Status: DC

## 2020-07-28 MED ORDER — ALBUTEROL SULFATE HFA 108 (90 BASE) MCG/ACT IN AERS: 2.0000 | INHALATION_SPRAY | Freq: Four times a day (QID) | RESPIRATORY_TRACT | Status: DC | PRN

## 2020-07-28 MED ORDER — THIAMINE HCL 100 MG/ML IJ SOLN
100.0000 mg | Freq: Every day | INTRAMUSCULAR | Status: DC
  Administered 2020-07-29: 100 mg via INTRAVENOUS
  Filled 2020-07-28: qty 2

## 2020-07-28 MED ORDER — NICOTINE 21 MG/24HR TD PT24
21.0000 mg | MEDICATED_PATCH | Freq: Every day | TRANSDERMAL | Status: DC
  Administered 2020-07-28: 21 mg via TRANSDERMAL
  Filled 2020-07-28: qty 1

## 2020-07-28 MED ORDER — LORAZEPAM 2 MG/ML IJ SOLN
0.0000 mg | Freq: Four times a day (QID) | INTRAMUSCULAR | Status: DC
  Administered 2020-07-28: 2 mg via INTRAVENOUS
  Administered 2020-07-29: 4 mg via INTRAVENOUS
  Filled 2020-07-28: qty 1
  Filled 2020-07-28: qty 2

## 2020-07-28 MED ORDER — ALUM & MAG HYDROXIDE-SIMETH 200-200-20 MG/5ML PO SUSP: 30.0000 mL | Freq: Four times a day (QID) | ORAL | Status: DC | PRN

## 2020-07-28 MED ORDER — LORAZEPAM 2 MG/ML IJ SOLN
2.0000 mg | Freq: Once | INTRAMUSCULAR | Status: AC
  Administered 2020-07-28: 2 mg via INTRAVENOUS
  Filled 2020-07-28: qty 1

## 2020-07-28 MED ORDER — LORAZEPAM 1 MG PO TABS: 0.0000 mg | ORAL_TABLET | Freq: Four times a day (QID) | ORAL | Status: DC

## 2020-07-28 MED ORDER — ZOLPIDEM TARTRATE 5 MG PO TABS
5.0000 mg | ORAL_TABLET | Freq: Every evening | ORAL | Status: DC | PRN
  Administered 2020-07-29 – 2020-08-01 (×5): 5 mg via ORAL
  Filled 2020-07-28 (×5): qty 1

## 2020-07-28 MED ORDER — LORAZEPAM 2 MG/ML IJ SOLN: 0.0000 mg | Freq: Two times a day (BID) | INTRAMUSCULAR | Status: DC

## 2020-07-28 MED ORDER — ACETAMINOPHEN 325 MG PO TABS
650.0000 mg | ORAL_TABLET | ORAL | Status: DC | PRN
  Administered 2020-07-30 – 2020-08-01 (×2): 650 mg via ORAL
  Filled 2020-07-28 (×2): qty 2

## 2020-07-28 MED ORDER — NICOTINE 21 MG/24HR TD PT24
21.0000 mg | MEDICATED_PATCH | Freq: Every day | TRANSDERMAL | Status: DC
  Administered 2020-07-29 – 2020-08-02 (×5): 21 mg via TRANSDERMAL
  Filled 2020-07-28 (×5): qty 1
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
## 2020-07-28 NOTE — ED Provider Notes (Signed)
[facility] EMERGENCY DEPARTMENT Provider Note   CSN: [id number] Arrival date & time: 07/28/20  1946     History Chief Complaint  Patient presents with   Suicidal    Brendan Ochoa is a 45 y.o. male.  HPI   Patient has a history of substance abuse including a longtime history of alcohol and cocaine use however he states over the last 6 months he has been using methamphetamines, heroin which he smokes almost daily.  For the last 7 days he has been "on a bender", he states that he feels "fucked up" -and has been feeling particularly bad today after using his last dose of methamphetamine which made him feel gritty all over, feels like he has involuntary jerking movements of his body, he feels like he is cold, he is sweaty, he is rolling around on the bed and unable to get comfortable.  The patient initially stated that he had suicidal ideations, he now reports to me that he does not want to die, he does not feel suicidal he is just really upset about the way the drugs are making him feel.  He reports that he recently broke up with his girlfriend, they were both "druggies" and that over the last 7 days because of the break-up he has been using more and more methamphetamine.  He states he has not been using it to kill himself just to get high.  He states that he thinks the last dose of whenever he was given was something different because it made him feel poorly like he does today.  He does have a history of Crohn's disease, he states he sees occasional blood in his stool, that has not changed recently.  He is not vomiting.  He is not having any swelling of his legs or rashes on his skin.  He denies hallucinations.  Past Medical History:  Diagnosis Date   Asthma    Cocaine abuse ([place])    in past   Crohn disease ([place]) 2008   Depression    in past, not current as of 2019    ETOH abuse    history of alcohol use   GERD (gastroesophageal reflux disease)     Patient Active Problem  List   Diagnosis Date Noted   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   Epididymal cyst 06/14/2020   Unintentional weight loss 06/14/2020   Abnormal chest x-ray 06/14/2020   Elevated blood pressure reading without diagnosis of hypertension 05/27/2020   Influenza vaccination declined 05/27/2020   AKI (acute kidney injury) ([place]) 01/01/2020   Vomiting 01/01/2020   Cocaine abuse ([place]) 01/01/2020   Alcohol abuse 01/01/2020   Ileitis 02/18/2018   HYPERGLYCEMIA 11/01/2009   ABNORMAL WEIGHT GAIN 06/15/2009   Tobacco dependence 07/18/2007   SUBSTANCE ABUSE, MULTIPLE 07/18/2007   DEPRESSION 07/18/2007   ALLERGIC RHINITIS 07/18/2007   ASTHMA 07/18/2007   GERD 07/18/2007   Crohn's disease ([place]) 07/18/2007    Past Surgical History:  Procedure Laterality Date   BIOPSY  10/15/2017   Procedure: BIOPSY;  Surgeon: Danie Binder, MD;  [facility];  Service: Endoscopy;;  terminal ileum colon   COLONOSCOPY WITH PROPOFOL N/A 10/15/2017   redundant colon, normal rectum, external and internal hemorrhoids, quiescent crohn's. Pathology: inactive chronicn nonspecific ileitis   ESOPHAGOGASTRODUODENOSCOPY (EGD) WITH PROPOFOL N/A 10/15/2017   normal esophagus, small hiatal hernia, gastritis, duodenitis   FINGER AMPUTATION Right 2012   cut finger table saw and surgically amputated  Family History  Problem Relation Age of Onset   Stomach cancer Maternal Grandfather    Colon cancer Neg Hx     Social History   Tobacco Use   Smoking status: Current Every Day Smoker    Packs/day: 0.25    Years: 15.00    Pack years: 3.75    Types: Cigarettes   Smokeless tobacco: Never Used   Tobacco comment: 1/4 to 1/2 pack per day   Vaping Use   Vaping Use: Never used  Substance Use Topics   Alcohol use: Yes    Comment: occasionally   Drug use: Yes    Types: Cocaine    Comment: last use 2 days ago    Home Medications Prior to Admission medications   Medication  Sig Start Date End Date Taking? Authorizing Provider  albuterol (VENTOLIN HFA) 108 (90 Base) MCG/ACT inhaler Inhale 2 puffs into the lungs every 6 (six) hours as needed for wheezing or shortness of breath. Patient not taking: No sig reported 01/02/20   Kathie Dike, MD  nicotine (NICODERM CQ - DOSED IN MG/24 HOURS) 14 mg/24hr patch Place 1 patch (14 mg total) onto the skin daily. Patient not taking: Reported on 06/14/2020 05/27/20   Ladell Pier, MD  nicotine polacrilex (NICORETTE) 2 MG gum Take 1 each (2 mg total) by mouth as needed for smoking cessation. Patient not taking: Reported on 06/14/2020 05/27/20   Ladell Pier, MD    Allergies    Patient has no known allergies.  Review of Systems   Review of Systems  All other systems reviewed and are negative.   Physical Exam Updated Vital Signs BP (!) 150/101 Comment: Pt uncontrollably jerking   Pulse (!) 101    Resp 20    Ht 1.803 m (5' 11" )    Wt 70.6 kg    SpO2 99%    BMI 21.71 kg/m   Physical Exam Vitals and nursing note reviewed.  Constitutional:      General: He is in acute distress.     Appearance: He is well-developed. He is diaphoretic.     Comments: Diaphoretic, agitated, rocking back and forth on the bed  HENT:     Head: Normocephalic and atraumatic.     Nose: Nose normal. No congestion or rhinorrhea.     Mouth/Throat:     Mouth: Mucous membranes are moist.     Pharynx: No oropharyngeal exudate or posterior oropharyngeal erythema.  Eyes:     General: No scleral icterus.       Right eye: No discharge.        Left eye: No discharge.     Conjunctiva/sclera: Conjunctivae normal.     Pupils: Pupils are equal, round, and reactive to light.  Neck:     Thyroid: No thyromegaly.     Vascular: No JVD.  Cardiovascular:     Rate and Rhythm: Regular rhythm. Tachycardia present.     Heart sounds: Normal heart sounds. No murmur heard. No friction rub. No gallop.      Comments: Mild tachycardia, normal pulses at the  radial arteries Pulmonary:     Effort: Pulmonary effort is normal. No respiratory distress.     Breath sounds: Normal breath sounds. No wheezing or rales.  Abdominal:     General: Bowel sounds are normal. There is no distension.     Palpations: Abdomen is soft. There is no mass.     Tenderness: There is abdominal tenderness.     Comments: Mild tenderness  diffuse abdomen, very soft, no guarding  Musculoskeletal:        General: No tenderness, deformity or signs of injury. Normal range of motion.     Cervical back: Normal range of motion and neck supple.     Right lower leg: No edema.     Left lower leg: No edema.  Lymphadenopathy:     Cervical: No cervical adenopathy.  Skin:    General: Skin is warm.     Findings: No erythema or rash.  Neurological:     General: No focal deficit present.     Mental Status: He is alert.     Coordination: Coordination normal.     Comments: No facial droop, clear speech, moves all 4 extremities with normal coordination, occasional myoclonic jerking movements occur, the patient is rocking back and forth on the bed but able to follow commands with all 4 extremities  Psychiatric:     Comments: Agitated, denies suicidal ideations, is not responding to internal stimuli     ED Results / Procedures / Treatments   Labs (all labs ordered are listed, but only abnormal results are displayed) Labs Reviewed - No data to display  EKG None  Radiology No results found.  Procedures Procedures   Medications Ordered in ED Medications - No data to display  ED Course  I have reviewed the triage vital signs and the nursing notes.  Pertinent labs & imaging results that were available during my care of the patient were reviewed by me and considered in my medical decision making (see chart for details).    MDM Rules/Calculators/A&P                          Since breaking up with his girlfriend the patient states he has been staying in drug houses, crack  houses, occasionally with friends on the couch and has been using drugs daily.  I suspect that his symptoms are in someway related to either drug use or heroin withdrawal or combination of the 2.  His vital signs are reassuring, he is mildly hypertensive, mildly tachycardic, he will get benzodiazepines to treat what appears to be his sympathomimetic state.  The patient is agreeable to the plan  After receiving Ativan the patient is now ambulatory, he has no longer having any shaking or jerking movements, he is having some abnormal thoughts including thinking that he is seeing his ex-girlfriend getting married in one of the rooms.  He is not tachycardic, he is not diaphoretic, his labs show that he is positive for cocaine, methamphetamine and marijuana.  His electrolytes are unremarkable, he is Covid negative, his alcohol is undetectable and this is consistent with his story on arrival that he was not drinking alcohol anymore and has not been using heroin but is been using methamphetamine all week.  With the Ativan improving his symptoms he will get another dose, he will need to resolve back to his normal mental status, he again denied that he was suicidal, he is not under involuntary commitment,  At change of shift care signed out to oncoming emergency department physician Dr. Betsey Holiday to follow-up patient's improvement and disposition accordingly  Final Clinical Impression(s) / ED Diagnoses Final diagnoses:  None    Rx / DC Orders ED Discharge Orders    None       Noemi Chapel, MD 07/28/20 2300

## 2020-07-28 NOTE — ED Notes (Signed)
Pt ambulated to the bathroom unassisted.

## 2020-07-29 DIAGNOSIS — F19988 Other psychoactive substance use, unspecified with other psychoactive substance-induced disorder: Secondary | ICD-10-CM

## 2020-07-29 DIAGNOSIS — F09 Unspecified mental disorder due to known physiological condition: Secondary | ICD-10-CM

## 2020-07-29 LAB — BASIC METABOLIC PANEL
Anion gap: 13 (ref 5–15)
BUN: 15 mg/dL (ref 6–20)
CO2: 21 mmol/L — ABNORMAL LOW (ref 22–32)
Calcium: 9.8 mg/dL (ref 8.9–10.3)
Chloride: 103 mmol/L (ref 98–111)
Creatinine, Ser: 0.96 mg/dL (ref 0.61–1.24)
GFR, Estimated: >60 mL/min (ref >60)
Glucose, Bld: 135 mg/dL — ABNORMAL HIGH (ref 70–99)
Potassium: 3.7 mmol/L (ref 3.5–5.1)
Sodium: 137 mmol/L (ref 135–145)

## 2020-07-29 LAB — URINALYSIS, ROUTINE W REFLEX MICROSCOPIC
Bilirubin Urine: NEGATIVE
Glucose, UA: NEGATIVE mg/dL
Hgb urine dipstick: NEGATIVE
Ketones, ur: 20 mg/dL — AB
Leukocytes,Ua: NEGATIVE
Nitrite: NEGATIVE
Protein, ur: NEGATIVE mg/dL
Specific Gravity, Urine: 1.024 (ref 1.005–1.030)
pH: 6 (ref 5.0–8.0)

## 2020-07-29 LAB — CK: Total CK: 856 U/L — ABNORMAL HIGH (ref 49–397)

## 2020-07-29 LAB — MRSA PCR SCREENING: MRSA by PCR: NEGATIVE

## 2020-07-29 LAB — LACTIC ACID, PLASMA: Lactic Acid, Venous: 2.6 mmol/L (ref 0.5–1.9)

## 2020-07-29 LAB — MAGNESIUM: Magnesium: 2.1 mg/dL (ref 1.7–2.4)

## 2020-07-29 LAB — PHOSPHORUS: Phosphorus: 4.5 mg/dL (ref 2.5–4.6)

## 2020-07-29 MED ORDER — THIAMINE HCL 100 MG PO TABS
100.0000 mg | ORAL_TABLET | Freq: Every day | ORAL | Status: DC
  Administered 2020-07-30 – 2020-08-02 (×4): 100 mg via ORAL
  Filled 2020-07-29 (×4): qty 1

## 2020-07-29 MED ORDER — ADULT MULTIVITAMIN W/MINERALS CH
1.0000 | ORAL_TABLET | Freq: Every day | ORAL | Status: DC
  Administered 2020-07-30 – 2020-08-02 (×4): 1 via ORAL
  Filled 2020-07-29 (×4): qty 1

## 2020-07-29 MED ORDER — LORAZEPAM 2 MG/ML IJ SOLN
1.0000 mg | INTRAMUSCULAR | Status: AC | PRN
Start: 2020-07-29 — End: 2020-07-30
  Administered 2020-07-29 – 2020-07-30 (×2): 1 mg via INTRAVENOUS
  Filled 2020-07-29 (×2): qty 1

## 2020-07-29 MED ORDER — KETOROLAC TROMETHAMINE 30 MG/ML IJ SOLN
30.0000 mg | Freq: Four times a day (QID) | INTRAMUSCULAR | Status: AC | PRN
  Administered 2020-07-30: 30 mg via INTRAVENOUS
  Filled 2020-07-29 (×2): qty 1

## 2020-07-29 MED ORDER — SODIUM CHLORIDE 0.9 % IV BOLUS
1000.0000 mL | Freq: Once | INTRAVENOUS | Status: AC
  Administered 2020-07-29: 1000 mL via INTRAVENOUS

## 2020-07-29 MED ORDER — ONDANSETRON HCL 4 MG PO TABS: 4.0000 mg | ORAL_TABLET | Freq: Four times a day (QID) | ORAL | Status: DC | PRN

## 2020-07-29 MED ORDER — PANTOPRAZOLE SODIUM 40 MG PO TBEC
40.0000 mg | DELAYED_RELEASE_TABLET | Freq: Every day | ORAL | Status: DC
  Administered 2020-07-30 – 2020-08-02 (×4): 40 mg via ORAL
  Filled 2020-07-29 (×4): qty 1

## 2020-07-29 MED ORDER — THIAMINE HCL 100 MG/ML IJ SOLN: 100.0000 mg | Freq: Every day | INTRAMUSCULAR | Status: DC

## 2020-07-29 MED ORDER — ONDANSETRON HCL 4 MG/2ML IJ SOLN: 4.0000 mg | Freq: Four times a day (QID) | INTRAMUSCULAR | Status: DC | PRN

## 2020-07-29 MED ORDER — ZIPRASIDONE MESYLATE 20 MG IM SOLR
20.0000 mg | Freq: Once | INTRAMUSCULAR | Status: AC
  Administered 2020-07-29: 20 mg via INTRAMUSCULAR
  Filled 2020-07-29: qty 20

## 2020-07-29 MED ORDER — SODIUM CHLORIDE 0.9 % IV SOLN: INTRAVENOUS | Status: DC

## 2020-07-29 MED ORDER — LORAZEPAM 2 MG/ML IJ SOLN
4.0000 mg | Freq: Once | INTRAMUSCULAR | Status: AC
  Administered 2020-07-29: 4 mg via INTRAMUSCULAR
  Filled 2020-07-29: qty 2

## 2020-07-29 MED ORDER — HEPARIN SODIUM (PORCINE) 5000 UNIT/ML IJ SOLN
5000.0000 [IU] | Freq: Three times a day (TID) | INTRAMUSCULAR | Status: DC
  Administered 2020-07-29 – 2020-08-02 (×13): 5000 [IU] via SUBCUTANEOUS
  Filled 2020-07-29 (×13): qty 1

## 2020-07-29 MED ORDER — HALOPERIDOL LACTATE 5 MG/ML IJ SOLN
5.0000 mg | Freq: Four times a day (QID) | INTRAMUSCULAR | Status: DC | PRN
  Administered 2020-07-29 – 2020-07-30 (×3): 5 mg via INTRAVENOUS
  Filled 2020-07-29 (×3): qty 1

## 2020-07-29 MED ORDER — FOLIC ACID 1 MG PO TABS
1.0000 mg | ORAL_TABLET | Freq: Every day | ORAL | Status: DC
  Administered 2020-07-30 – 2020-08-02 (×4): 1 mg via ORAL
  Filled 2020-07-29 (×4): qty 1

## 2020-07-29 MED ORDER — DIAZEPAM 5 MG/ML IJ SOLN
10.0000 mg | Freq: Once | INTRAMUSCULAR | Status: AC
  Administered 2020-07-29: 10 mg via INTRAVENOUS
  Filled 2020-07-29: qty 2

## 2020-07-29 MED ORDER — CHLORHEXIDINE GLUCONATE CLOTH 2 % EX PADS
6.0000 | MEDICATED_PAD | Freq: Every day | CUTANEOUS | Status: DC
  Administered 2020-07-29 – 2020-08-02 (×3): 6 via TOPICAL

## 2020-07-29 NOTE — Progress Notes (Signed)
[facility] night shift.  The nursing staff reported that the patient has been restless despite being administered haloperidol 5 mg IVP and zolpidem 5 mg p.o. earlier.  He received ziprasidone 20 mg IM x1, followed by lorazepam 4 mg IM x1 and Valium 10 mg IVP in the ED in the morning.  He currently has a bed sitter and soft restraints.  I will add lorazepam 1 mg IVP every 4 hours x 2 doses if haloperidol 5 mg IVP every 6 hours is not sufficient to treat his symptoms.  Tennis Must, MD.

## 2020-07-29 NOTE — Progress Notes (Signed)
Initial Nutrition Assessment  DOCUMENTATION CODES:   Not applicable  INTERVENTION:   -RD will follow for diet advancement and add supplements as appropriate  NUTRITION DIAGNOSIS:   Inadequate oral intake related to inability to eat as evidenced by NPO status.  GOAL:   Patient will meet greater than or equal to 90% of their needs  MONITOR:   PO intake,Supplement acceptance,Diet advancement,Labs,Weight trends,Skin,I & O's  REASON FOR ASSESSMENT:   Malnutrition Screening Tool    ASSESSMENT:   Patient has a history of substance abuse including a longtime history of alcohol and cocaine use however he states over the last 6 months he has been using methamphetamines, heroin which he smokes almost daily.  For the last 7 days he has been "on a bender", he states that he feels "fucked up" -and has been feeling particularly bad today after using his last dose of methamphetamine which made him feel gritty all over, feels like he has involuntary jerking movements of his body, he feels like he is cold, he is sweaty, he is rolling around on the bed and unable to get comfortable.  Pt admitted with delirium possibly secondary to withdrawal symptoms from substance abuse.   Pt with history of polysubstance abuse, including alcohol, cocaine, and amphetamines.   Per chart review, pt has been agitated and combative at times. He is currently in 2 point restraints. Upon entering room, pt was lying in bed with sheets covering entire body. Did not attempt to waken or complete nutrition-focused physcial exam at this time.   Reviewed wt hx; pt has experienced a 11.4% wt loss over the past 3 months, which is significantfor time frame.   Pt is at high risk for malnutrition given weight loss and polysubstance abuse. Suspect poor oral intake or diet of poor nutritional quality PTA. He would benefit from addition of oral nutrition supplements once diet is advanced.   Medications reviewed and include thiamine  and 0.9% sodium chloride infusion @ 100 ml/hr.   Labs reviewed.   Diet Order:   Diet Order            Diet NPO time specified  Diet effective now                 EDUCATION NEEDS:   No education needs have been identified at this time  Skin:  Skin Assessment: Reviewed RN Assessment  Last BM:  Unknown  Height:   Ht Readings from Last 1 Encounters:  07/29/20 5' 11"  (1.803 m)    Weight:   Wt Readings from Last 1 Encounters:  07/29/20 66.3 kg    Ideal Body Weight:  78.2 kg  BMI:  Body mass index is 20.39 kg/m.  Estimated Nutritional Needs:   Kcal:  2000-2200  Protein:  115-130 grams  Fluid:  > 2 L    Loistine Chance, RD, LDN, [place] Registered Dietitian II Certified Diabetes Care and Education Specialist Please refer to [facility] for RD and/or RD on-call/weekend/after hours pager

## 2020-07-29 NOTE — ED Notes (Signed)
Pt is less resistant to care.  Having longer periods of rest (naps).

## 2020-07-29 NOTE — ED Notes (Signed)
Pt assessed by this nurse. Pt is in 2 point restraints. One to left wrist and one to right ankle. Pt right wrist is swollen and red. Per previous nurse this has already been recognized and MD has been made aware. Vss. Equal rise and fall of chest. Bed lowered ad locked. Side rails up x2.

## 2020-07-29 NOTE — ED Notes (Signed)
Pt pulling on cuffs creating skin tears, coband has been reapplied.  Pt refuses to cooperate.  Officer at bedside

## 2020-07-29 NOTE — ED Notes (Signed)
Pt screaming out, thrashing around in bed, pulling at cuffs and still attempting to get out of bed. EDP made aware

## 2020-07-29 NOTE — ED Notes (Signed)
Pt TTS in progress

## 2020-07-29 NOTE — H&P (Signed)
History and Physical    Brendan Ochoa XKG:[id number] DOB: 1976/03/07 DOA: 07/28/2020  PCP: Ladell Pier, MD   Patient coming from: Home  I have personally briefly reviewed patient's old medical records in [place]  Chief Complaint: Acute psychosis/delirium in the setting of polysubstance abuse  HPI: Brendan Ochoa is a 45 y.o. male with medical history significant of with past medical history significant of polysubstance use, history of Crohn's, asthma, gastroesophageal flux disease and prior hx of alcohol abuse; who presented to the emergency department secondary to not feeling good and having vague suicidal thoughts.  Patient reports using cocaine: Heroine, marijuana and amphetamine recently in binge manners after breaking up with his girlfriend. Patient denied to EDP CP, SOB, nausea, vomiting, dysuria, focal weakness or overt bleeding.   Patient symptoms deteriorated, behavior became combative and very agitated. Not following commands and essentially in danger to himself and staff. Restrains applied, geodon and benzodiazepine given.   Patient is hemodynamically stable, no following commands and at the time of my examination very obtunded and sleeping.  Protecting airways.   Review of Systems: As per HPI otherwise all other systems reviewed and are negative.   Past Medical History:  Diagnosis Date  . Asthma   . Cocaine abuse ([place])    in past  . Crohn disease ([place]) 2008  . Depression    in past, not current as of 2019   . ETOH abuse    history of alcohol use  . GERD (gastroesophageal reflux disease)     Past Surgical History:  Procedure Laterality Date  . BIOPSY  10/15/2017   Procedure: BIOPSY;  Surgeon: Danie Binder, MD;  Location: [facility];  Service: Endoscopy;;  terminal ileum colon  . COLONOSCOPY WITH PROPOFOL N/A 10/15/2017   redundant colon, normal rectum, external and internal hemorrhoids, quiescent crohn's. Pathology: inactive chronicn  nonspecific ileitis  . ESOPHAGOGASTRODUODENOSCOPY (EGD) WITH PROPOFOL N/A 10/15/2017   normal esophagus, small hiatal hernia, gastritis, duodenitis  . FINGER AMPUTATION Right 2012   cut finger table saw and surgically amputated    Social History  reports that he has been smoking cigarettes. He has a 3.75 pack-year smoking history. He has never used smokeless tobacco. He reports current alcohol use. He reports current drug use. Drug: Cocaine.  No Known Allergies  Family History  Problem Relation Age of Onset  . Stomach cancer Maternal Grandfather   . Colon cancer Neg Hx     Prior to Admission medications   Medication Sig Start Date End Date Taking? Authorizing Provider  albuterol (VENTOLIN HFA) 108 (90 Base) MCG/ACT inhaler Inhale 2 puffs into the lungs every 6 (six) hours as needed for wheezing or shortness of breath. 01/02/20   Kathie Dike, MD  nicotine (NICODERM CQ - DOSED IN MG/24 HOURS) 14 mg/24hr patch Place 1 patch (14 mg total) onto the skin daily. 05/27/20   Ladell Pier, MD  nicotine polacrilex (NICORETTE) 2 MG gum Take 1 each (2 mg total) by mouth as needed for smoking cessation. 05/27/20   Ladell Pier, MD    Physical Exam:  Vitals:   07/28/20 1952 07/28/20 2002 07/28/20 2119 07/29/20 0835  BP:  (!) 150/101 (!) 133/103 (!) 128/105  Pulse:  (!) 101 90 76  Resp:  20 17 (!) 31  Temp:   98.9 F (37.2 C)   TempSrc:   Oral   SpO2:  99% 100% 100%  Weight: 70.6 kg     Height: 5'  11" (1.803 m)       Constitutional: obtunded, restrains in placed. Not following commands.  Vitals:   07/28/20 1952 07/28/20 2002 07/28/20 2119 07/29/20 0835  BP:  (!) 150/101 (!) 133/103 (!) 128/105  Pulse:  (!) 101 90 76  Resp:  20 17 (!) 31  Temp:   98.9 F (37.2 C)   TempSrc:   Oral   SpO2:  99% 100% 100%  Weight: 70.6 kg     Height: 5' 11"  (1.803 m)      Eyes: PERRL, lids and conjunctivae normal; no icterus. ENMT: Mucous membranes are moist. Posterior pharynx clear of  any exudate or lesions.Normal dentition.  Neck: normal, supple, no masses, no thyromegaly; no guarding. Respiratory: clear to auscultation bilaterally, no wheezing, no crackles. Normal respiratory effort. No accessory muscle use.  No using accessory muscle. Cardiovascular: Sinus tachycardia, no rubs, no gallops, no murmurs.    Abdomen: no tenderness, no masses palpated. No hepatosplenomegaly. Bowel sounds positive.  Musculoskeletal: no clubbing / cyanosis. No joint deformity upper and lower extremities. Good ROM, no contractures. Normal muscle tone.  Skin: no petechiae. Neurologic: CN 2-12 grossly intact. Sensation intact, DTR normal. Strength 5/5 in all 4.  Psychiatric: Normal judgment and insight. Alert and oriented x 3. Normal mood.    Labs on Admission: I have personally reviewed following labs and imaging studies  CBC: Recent Labs  Lab 07/28/20 2042  WBC 8.4  NEUTROABS 5.7  HGB 15.2  HCT 44.6  MCV 88.7  PLT 938    Basic Metabolic Panel: Recent Labs  Lab 07/28/20 2042 07/29/20 0634  NA 134* 137  K 3.7 3.7  CL 102 103  CO2 19* 21*  GLUCOSE 103* 135*  BUN 9 15  CREATININE 0.81 0.96  CALCIUM 9.3 9.8    GFR: Estimated Creatinine Clearance: 100.1 mL/min (by C-G formula based on SCr of 0.96 mg/dL).  Liver Function Tests: Recent Labs  Lab 07/28/20 2042  AST 24  ALT 18  ALKPHOS 68  BILITOT 1.1  PROT 7.6  ALBUMIN 4.3    Urine analysis:    Component Value Date/Time   COLORURINE COLORLESS (A) 11/20/2016 2141   APPEARANCEUR Clear 05/27/2020 1210   LABSPEC 1.002 (L) 11/20/2016 2141   PHURINE 7.0 11/20/2016 2141   GLUCOSEU Negative 05/27/2020 1210   HGBUR NEGATIVE 11/20/2016 2141   BILIRUBINUR Negative 05/27/2020 1210   BILIRUBINUR negative 05/27/2020 1208   KETONESUR negative 05/27/2020 [place] 11/20/2016 2141   PROTEINUR Negative 05/27/2020 1210   PROTEINUR NEGATIVE 11/20/2016 2141   UROBILINOGEN 0.2 05/27/2020 1208   UROBILINOGEN 0.2  10/23/2010 0103   NITRITE Negative 05/27/2020 1210   NITRITE Negative 05/27/2020 1208   NITRITE NEGATIVE 11/20/2016 2141   LEUKOCYTESUR Negative 05/27/2020 1210   LEUKOCYTESUR Negative 05/27/2020 1208    Radiological Exams on Admission: No results found.  EKG: Independently reviewed.  Left ventricle hypertrophy by voltage; no acute ischemic changes.  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 -Alcohol level negative after records has been for the last 20-month -will use CIWA monitoring and provide thiamine and folic; planning to treat with Ativan if CIWA score more than 8 -Once able to cooperate with examination will have TTS evaluation. -Due to concerns of passive suicidal thoughts on his initial presentation to ED we  will ask for bedside sitter. -Admitted to stepdown bed in case patient ended up requiring to be off Precedex drip.  2-history of asthma -Continue no wheezing no complaint of shortness of breath currently -Continue monitoring -If needed will use PRN bronchodilators  3-GERD -continue PPI  4-mild rhabdomyolysis -non traumatic -continue IVF's -follow CK  5-suicidal thoughts -will follow TTS eval and rec's -sitter at bedside for now -patient was IVC by EDP   DVT prophylaxis: Heparin Code Status:   Full code Family Communication:  No family at bedside. Disposition Plan:   Patient is from:  Home  Anticipated DC to:  To be determined  Anticipated DC date:  To be determined  Anticipated DC barriers: Improvement in mentation and psychiatry clearance; might need inpatient psych therapy.  Consults called:  TTS Admission status:  Inpatient, stepdown, length of stay more than 2  midnights.  Severity of Illness: 32 severe illness in the setting of acute psychosis triggered by polysubstance abuse and presumed withdrawal.   Barton Dubois MD [facility]  How to contact the [facility] Attending or Consulting provider [place] or covering provider during after hours [place], for this patient?   1. Check the care team in [facility] and look for a) attending/consulting [facility] provider listed and b) the [facility] team listed 2. Log into [email or web address] and use [place]'s universal password to access. If you do not have the password, please contact the hospital operator. 3. Locate the [facility] provider you are looking for under [facility] and page to a number that you can be directly reached. 4. If you still have difficulty reaching the provider, please page the [facility] (Director on Call) for the Hospitalists listed on amion for assistance.  07/29/2020, 9:05 AM

## 2020-07-29 NOTE — ED Notes (Signed)
pts mother karen updated on patients status.

## 2020-07-29 NOTE — ED Notes (Addendum)
Pt wrist wrapped in coband, cuffs replaced

## 2020-07-29 NOTE — ED Notes (Signed)
IVC papers faxed to Magistrate 

## 2020-07-29 NOTE — ED Notes (Signed)
Remains restless and not answering when name called.

## 2020-07-29 NOTE — ED Notes (Signed)
Pt agitated, screaming, trying to get restraints off. Will not cooperate. EDP made aware

## 2020-07-29 NOTE — ED Provider Notes (Signed)
Patient with delirium probably secondary to withdrawal symptoms from substance abuse.  Patient is awake but will not answer any questions and is very combative.  He will be admitted to medicine for his delirium and then have behavioral health involved when he gets better   Brendan Ferguson, MD 07/29/20 [phone]

## 2020-07-29 NOTE — ED Notes (Signed)
Pt threw up onself, head of bed raised cuffs moved by officer to try to keep pt from sliding down in bed.   EDP made aware

## 2020-07-29 NOTE — ED Provider Notes (Signed)
Patient has been agitated throughout the night.  He was given 2 doses of Geodon and Ativan with only minimal improvement.  This morning, patient has started to become profusely diaphoretic and more altered.  Initial agitation was felt to be with psychosis secondary to acute drug use.  He does, however, have a history of alcoholism.  Alcohol level was negative at arrival.  Withdrawal symptoms are also considered.  Will recheck labs and initiate IV fluid hydration.   Orpah Greek, MD 07/29/20 [phone]

## 2020-07-29 NOTE — ED Notes (Addendum)
Pt is restless, jerking around in the bed, refuses to cooperate with staff. EDP made aware

## 2020-07-29 NOTE — BH Assessment (Signed)
Pt unable to be assessed at this time.  Pt is thrashing about on the bed.  He has cuffs on feet and hands.  He cannot answer questions, he grunts and mutters.  Pt cannot give his DOB and is not oriented to situation, time or place or person.  Pt needs to be calm to participate in TTS assessment, he is not that now.

## 2020-07-29 NOTE — ED Notes (Signed)
Pt refuses to stay in room, walking around nurses station with unsteady gait. [employer] now at bedside

## 2020-07-29 NOTE — BH Assessment (Signed)
Patient to be medically admitted. TTS to see upon medical clearance.

## 2020-07-30 DIAGNOSIS — F15959 Other stimulant use, unspecified with stimulant-induced psychotic disorder, unspecified: Secondary | ICD-10-CM | POA: Diagnosis present

## 2020-07-30 LAB — CBC
HCT: 41.1 % (ref 39.0–52.0)
Hemoglobin: 13.8 g/dL (ref 13.0–17.0)
MCH: 30.5 pg (ref 26.0–34.0)
MCHC: 33.6 g/dL (ref 30.0–36.0)
MCV: 90.9 fL (ref 80.0–100.0)
Platelets: 240 10*3/uL (ref 150–400)
RBC: 4.52 MIL/uL (ref 4.22–5.81)
RDW: 12.1 % (ref 11.5–15.5)
WBC: 6.3 10*3/uL (ref 4.0–10.5)
nRBC: 0 % (ref 0.0–0.2)

## 2020-07-30 LAB — BASIC METABOLIC PANEL
Anion gap: 9 (ref 5–15)
BUN: 15 mg/dL (ref 6–20)
CO2: 21 mmol/L — ABNORMAL LOW (ref 22–32)
Calcium: 8.4 mg/dL — ABNORMAL LOW (ref 8.9–10.3)
Chloride: 107 mmol/L (ref 98–111)
Creatinine, Ser: 0.72 mg/dL (ref 0.61–1.24)
GFR, Estimated: >60 mL/min (ref >60)
Glucose, Bld: 98 mg/dL (ref 70–99)
Potassium: 3.4 mmol/L — ABNORMAL LOW (ref 3.5–5.1)
Sodium: 137 mmol/L (ref 135–145)

## 2020-07-30 MED ORDER — LORAZEPAM 2 MG/ML IJ SOLN
1.0000 mg | INTRAMUSCULAR | Status: AC | PRN
  Administered 2020-07-30 (×3): 2 mg via INTRAVENOUS
  Administered 2020-07-31 – 2020-08-01 (×3): 1 mg via INTRAVENOUS
  Administered 2020-08-02 (×2): 2 mg via INTRAVENOUS
  Administered 2020-08-02: 4 mg via INTRAVENOUS
  Filled 2020-07-30: qty 1
  Filled 2020-07-30: qty 2
  Filled 2020-07-30 (×5): qty 1
  Filled 2020-07-30: qty 2
  Filled 2020-07-30 (×2): qty 1

## 2020-07-30 MED ORDER — DOXYCYCLINE HYCLATE 100 MG PO TABS
100.0000 mg | ORAL_TABLET | Freq: Two times a day (BID) | ORAL | Status: DC
  Administered 2020-07-30 – 2020-08-02 (×6): 100 mg via ORAL
  Filled 2020-07-30 (×6): qty 1

## 2020-07-30 MED ORDER — IBUPROFEN 400 MG PO TABS
400.0000 mg | ORAL_TABLET | Freq: Three times a day (TID) | ORAL | Status: AC
  Administered 2020-07-30 – 2020-08-01 (×6): 400 mg via ORAL
  Filled 2020-07-30 (×6): qty 1

## 2020-07-30 MED ORDER — POTASSIUM CHLORIDE CRYS ER 20 MEQ PO TBCR
40.0000 meq | EXTENDED_RELEASE_TABLET | Freq: Once | ORAL | Status: AC
  Administered 2020-07-30: 40 meq via ORAL
  Filled 2020-07-30: qty 2

## 2020-07-30 MED ORDER — LORAZEPAM 1 MG PO TABS
1.0000 mg | ORAL_TABLET | ORAL | Status: AC | PRN
  Filled 2020-07-30: qty 2

## 2020-07-30 NOTE — Progress Notes (Addendum)
0700 Patient found not in restraints.  Patient following commands and has bedside sitter discontinuing the restraints.  Patients Right arm edematous and abrasion noted.  MD aware

## 2020-07-30 NOTE — Progress Notes (Signed)
Paged attending re: Patient very axious administered haldol 77m at 1048 unable to give again for 6 more hours.  Patient restless in bed and states he wants to leave now.  He wants to see a "psych doctor" so he can leave

## 2020-07-30 NOTE — Progress Notes (Signed)
Patient restless in bed.  States he needs something to help him calm down.  Administed 20m IV ativan per MD's order.

## 2020-07-30 NOTE — Progress Notes (Signed)
Patient resting quietly in bed with eyes closed with no S&S of distress or anxiety.

## 2020-07-30 NOTE — Consult Note (Addendum)
Patient was found with a noose around his neck after his girlfriend broke up with him.  He was also using multiple stimulants and agitated with restraints at times.  He should be IVC'd as he definitely needs psychiatric admission after he is medically cleared and stable.  Recommend gabapentin 300 mg TID to assist with stimulant detox along with haldol 5 mg, benadryl 50 mg, and Ativan 2 mg PRN agitation (please refrain if "obtunded" as per notes) every 6 hours PRN agitation (combination is typically more effective than haldol alone).  Repeat CK level to see if levels are increasing or decreasing, monitor for rhabdomyolysis.    Waylan Boga, PMHNP

## 2020-07-30 NOTE — Progress Notes (Signed)
Patient restless in bed and out.  Patient states he is anxious.  He is requesting something to help him calm down.  Administered Haldol 107m IV.

## 2020-07-30 NOTE — Progress Notes (Signed)
Patient resting quietly in bed no S&S of pain or anxiety.

## 2020-07-30 NOTE — Progress Notes (Signed)
PROGRESS NOTE    Brendan Ochoa  VEL:[id number] DOB: 09/15/1975 DOA: 07/28/2020 PCP: Ladell Pier, MD    Chief Complaint  Patient presents with  . Suicidal    Brief Narrative:  Brendan Ochoa is a 45 y.o. male with medical history significant of with past medical history significant of polysubstance use, history of Crohn's, asthma, gastroesophageal flux disease and prior hx of alcohol abuse; who presented to the emergency department secondary to not feeling good and having vague suicidal thoughts.  Patient reports using cocaine: Heroine, marijuana and amphetamine recently in binge manners after breaking up with his girlfriend. Patient denied to EDP CP, SOB, nausea, vomiting, dysuria, focal weakness or overt bleeding.   Patient symptoms deteriorated, behavior became combative and very agitated. Not following commands and essentially in danger to himself and staff. Restrains applied, geodon and benzodiazepine given.   Patient is hemodynamically stable, no following commands and at the time of my examination very obtunded and sleeping.  Protecting airways.  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 -Currently patient is not agitated or combative; following commands and denied suicidal thoughts. -Anxious and restless on examination -Reports ongoing binge of polysubstance use since after breaking up with his girlfriend for approximately 7 days straight prior to admission. -Continue as needed benzodiazepines, CIWA monitoring and as needed Haldol. -From internal medicine standpoint patient has been entirely stabilized and is ready to receive evaluation by TTS.  2-history of asthma -No wheezing, no shortness of breath complaints -Continue as needed bronchodilators.  3-gastroesophageal for disease -Continue PPI.  4-right upper extremity cellulitis -Apply cold compresses, keep leg elevated -Short course of oral doxycycline initiated. -ibuprofen  has been ordered to assist with pain and swelling.  5-mild rhabdomyolysis -Nontraumatic -Continue IV fluids and maintain adequate hydration.  6-hypokalemia -Will replete electrolytes and follow trend.   DVT prophylaxis: Heparin Code Status: Full code Family Communication: No family at bedside. Disposition:   Status is: Inpatient  Dispo: The patient is from: Home              Anticipated d/c is to: To be determined              Patient currently medically stable and overall improving in his psychotic presentation.  From internal medicine standpoint he is clear and waiting assessment and recommendations by psychiatry service.  We will continue as needed benzodiazepine and Haldol at this time; still restless.   Difficult to place patient no     Consultants:   Psychiatry service   Procedures:  See below for x-ray reports.   Antimicrobials:  Doxycycline   Subjective: No fever, no chest pain, no nausea vomiting.  Patient is hemodynamically stable currently.  Denies suicidal thoughts.  Right upper extremity with some pain, redness and warmth sensation.  Objective: Vitals:   07/30/20 1400 07/30/20 1444 07/30/20 1505 07/30/20 1600  BP:    (!) 164/138  Pulse:  77 96   Resp: (!) 23 18 (!) 26 (!) 24  Temp:    98.3 F (36.8 C)  TempSrc:      SpO2:      Weight:      Height:        Intake/Output Summary (Last 24 hours) at 07/30/2020 1625 Last data filed at 07/30/2020 1500 Gross per 24 hour  Intake 3711.66 ml  Output 3352 ml  Net 359.66 ml   Filed Weights   07/28/20 1952 07/29/20 1344  Weight: 70.6 kg 66.3 kg  Examination: General exam: Afebrile, no chest pain, no nausea, no vomiting.  Patient reports being hungry, and feeling restless and having pain on his right hand/forearm.  Currently denying suicidal thoughts. Respiratory system: Clear to auscultation. Respiratory effort normal.  No using accessory muscles.  Good saturation on room air. Cardiovascular system:  S1 & S2 heard, RRR. No JVD, murmurs, rubs, gallops or clicks. No pedal edema. Gastrointestinal system: Abdomen is nondistended, soft and nontender. No organomegaly or masses felt. Normal bowel sounds heard. Central nervous system: Alert and oriented. No focal neurological deficits. Extremities: No cyanosis or clubbing; right upper extremity with swelling hand/wrist and forearm.  Mild warm sensation to palpation and redness. Skin: No petechiae.  Early cellulitic changes appreciated on his right upper extremity. Psychiatry: Currently patient reports no suicidal thoughts.  Restless.   Data Reviewed: I have personally reviewed following labs and imaging studies  CBC: Recent Labs  Lab 07/28/20 2042 07/30/20 0351  WBC 8.4 6.3  NEUTROABS 5.7  --   HGB 15.2 13.8  HCT 44.6 41.1  MCV 88.7 90.9  PLT 339 671    Basic Metabolic Panel: Recent Labs  Lab 07/28/20 2042 07/29/20 0634 07/29/20 1849 07/30/20 0351  NA 134* 137  --  137  K 3.7 3.7  --  3.4*  CL 102 103  --  107  CO2 19* 21*  --  21*  GLUCOSE 103* 135*  --  98  BUN 9 15  --  15  CREATININE 0.81 0.96  --  0.72  CALCIUM 9.3 9.8  --  8.4*  MG  --   --  2.1  --   PHOS  --   --  4.5  --    GFR: Estimated Creatinine Clearance: 112.8 mL/min (by C-G formula based on SCr of 0.72 mg/dL).  Liver Function Tests: Recent Labs  Lab 07/28/20 2042  AST 24  ALT 18  ALKPHOS 68  BILITOT 1.1  PROT 7.6  ALBUMIN 4.3    CBG: No results for input(s): GLUCAP in the last 168 hours.   Recent Results (from the past 240 hour(s))  Resp Panel by RT-PCR (Flu A&B, Covid) Nasopharyngeal Swab     Status: None   Collection Time: 07/28/20  9:46 PM   Specimen: Nasopharyngeal Swab; Nasopharyngeal(NP) swabs in vial transport medium  Result Value Ref Range Status   SARS Coronavirus 2 by RT PCR NEGATIVE NEGATIVE Final    Comment: (NOTE) SARS-CoV-2 target nucleic acids are NOT DETECTED.  The SARS-CoV-2 RNA is generally detectable in upper  respiratory specimens during the acute phase of infection. The lowest concentration of SARS-CoV-2 viral copies this assay can detect is 138 copies/mL. A negative result does not preclude SARS-Cov-2 infection and should not be used as the sole basis for treatment or other patient management decisions. A negative result may occur with  improper specimen collection/handling, submission of specimen other than nasopharyngeal swab, presence of viral mutation(s) within the areas targeted by this assay, and inadequate number of viral copies(<138 copies/mL). A negative result must be combined with clinical observations, patient history, and epidemiological information. The expected result is Negative.  Fact Sheet for Patients:  EntrepreneurPulse.com.au  Fact Sheet for Healthcare Providers:  IncredibleEmployment.be  This test is no t yet approved or cleared by the [place] FDA and  has been authorized for detection and/or diagnosis of SARS-CoV-2 by FDA under an Emergency Use Authorization (EUA). This EUA will remain  in effect (meaning this test can be used) for the duration  of the COVID-19 declaration under Section 564(b)(1) of the Act, 21 U.S.C.section 360bbb-3(b)(1), unless the authorization is terminated  or revoked sooner.       Influenza A by PCR NEGATIVE NEGATIVE Final   Influenza B by PCR NEGATIVE NEGATIVE Final    Comment: (NOTE) The Xpert Xpress SARS-CoV-2/FLU/RSV plus assay is intended as an aid in the diagnosis of influenza from Nasopharyngeal swab specimens and should not be used as a sole basis for treatment. Nasal washings and aspirates are unacceptable for Xpert Xpress SARS-CoV-2/FLU/RSV testing.  Fact Sheet for Patients: EntrepreneurPulse.com.au  Fact Sheet for Healthcare Providers: IncredibleEmployment.be  This test is not yet approved or cleared by the [place] FDA and has been  authorized for detection and/or diagnosis of SARS-CoV-2 by FDA under an Emergency Use Authorization (EUA). This EUA will remain in effect (meaning this test can be used) for the duration of the COVID-19 declaration under Section 564(b)(1) of the Act, 21 U.S.C. section 360bbb-3(b)(1), unless the authorization is terminated or revoked.  Performed at [facility], [address] [place], [place] [address]   MRSA PCR Screening     Status: None   Collection Time: 07/29/20  1:42 PM   Specimen: Nasal Mucosa; Nasopharyngeal  Result Value Ref Range Status   MRSA by PCR NEGATIVE NEGATIVE Final    Comment:        The GeneXpert MRSA Assay (FDA approved for NASAL specimens only), is one component of a comprehensive MRSA colonization surveillance program. It is not intended to diagnose MRSA infection nor to guide or monitor treatment for MRSA infections. Performed at [facility], [address], [place], [place] [address]      Radiology Studies: No results found.   Scheduled Meds: . Chlorhexidine Gluconate Cloth  6 each Topical Daily  . folic acid  1 mg Oral Daily  . heparin injection (subcutaneous)  5,000 Units Subcutaneous Q8H  . multivitamin with minerals  1 tablet Oral Daily  . nicotine  21 mg Transdermal Daily  . pantoprazole  40 mg Oral Daily  . thiamine  100 mg Oral Daily   Or  . thiamine  100 mg Intravenous Daily   Continuous Infusions: . sodium chloride 100 mL/hr at 07/30/20 1500     LOS: 1 day    Time spent: 35 minutes  Barton Dubois, MD [facility]   To contact the attending provider between 7A-7P or the covering provider during after hours 7P-7A, please log into the web site [email or web address] and access using universal [place] password for that web site. If you do not have the password, please call the hospital operator.  07/30/2020, 4:25 PM

## 2020-07-30 NOTE — BH Assessment (Addendum)
Comprehensive Clinical Assessment (CCA) Note  07/30/2020 Brendan Ochoa [id number] Disposition: Marion Downer recommends a inpatient admission to assist with stabilization as appropriate bed placement is investigated.     Flowsheet Row ED to Hosp-Admission (Current) from 07/28/2020 in [facility]     Chief Complaint:  Chief Complaint  Patient presents with  . Suicidal  The patient demonstrates the following risk factors for suicide: Chronic risk factors for suicide include: substance use disorder. Acute risk factors for suicide include: family or marital conflict. Protective factors for this patient include: positive social support. Considering these factors, the overall suicide risk at this point appears to be high. Patient is appropriate for outpatient follow up.  Patient presents with ongoing S/I after he was found with a noose around his neck prior to arrival. Patient currently denies any S/I, H/I or AVH. Patient states he has been using multiple substances daily to include: methamphetamines in various amounts, cocaine and alcohol daily for the last 7 to 10 days. Patient is observed to be drowsy and renders limited history in reference to patterns of use, amounts used and time frame. Patient does report current withdrawals to include agitation, chills and nausea. Patient denies any prior attempts or gestures at self harm. Patient states he was diagnosed with depression "years ago" although denies having a current OP provider or being prescribed any medications for symptom management. Patient reports current stressors to include: a recent breakup with his partner (girlfriend) and ongoing SA issues. Patient reports depressive symptoms to include: feeling hopeless, isolating and feeling worthless. Patient does report that his mother is providing support and he wants to "get his life back on track" for himself and her. Patient states he will reside with  her on discharge. Patient denies access to firearms or history of trauma.      Per EDP note on arrival (07/28/20): Patient has a history of substance abuse including a longtime history of alcohol and cocaine use however he states over the last 6 months he has been using methamphetamines, heroin which he smokes almost daily.  For the last 7 days he has been "on a bender", he states that he feels "fucked up" -and has been feeling particularly bad today after using his last dose of methamphetamine which made him feel gritty all over, feels like he has involuntary jerking movements of his body, he feels like he is cold, he is sweaty, he is rolling around on the bed and unable to get comfortable.  The patient initially stated that he had suicidal ideations, he now reports to me that he does not want to die, he does not feel suicidal he is just really upset about the way the drugs are making him feel.  He reports that he recently broke up with his girlfriend, they were both "druggies" and that over the last 7 days because of the break-up he has been using more and more methamphetamine.  He states he has not been using it to kill himself just to get high.  He states that he thinks the last dose of whenever he was given was something different because it made him feel poorly like he does today.  He does have a history of Crohn's disease, he states he sees occasional blood in his stool, that has not changed recently.  He is not vomiting.  He is not having any swelling of his legs or rashes on his skin.  He denies hallucinations.  Lord DNP also evlauted patient and writes this date: Patient was found with a noose around his neck after his girlfriend broke up with him.  He was also using multiple stimulants and agitated with restraints at times.  He should be IVC'd as he definitely needs psychiatric admission after he is medically cleared and stable.  Recommend gabapentin 300 mg TID to assist with stimulant detox along  with haldol 5 mg, benadryl 50 mg, and Ativan 2 mg PRN agitation (please refrain if "obtunded" as per notes) every 6 hours PRN agitation (combination is typically more effective than haldol alone).  Repeat CK level to see if levels are increasing or decreasing, monitor for rhabdomyolysis.    Patient is observed to be drowsy and renders limited history at the time of assessment. Patient is oriented to person, situation and place. Patient speaks in a low soft voice and does not seem to process the content of this writer's questions at times. Patient does respond with prompting. Patient's affect is depressed and thoughts somewhat disorganized. Patient does not appear to be responding to internal stimuli. Patient agrees to a inpatient admission at this time.      Visit Diagnosis: MDD recurrent without psychotic features, severe, cocaine use, amphetamine and alcohol use.     CCA Screening, Triage and Referral (STR)  Patient Reported Information How did you hear about [name]? Self  Referral name: No data recorded Referral phone number: No data recorded  Whom do you see for routine medical problems? I don't have a doctor  Practice/Facility Name: No data recorded Practice/Facility Phone Number: No data recorded Name of Contact: No data recorded Contact Number: No data recorded Contact Fax Number: No data recorded Prescriber Name: No data recorded Prescriber Address (if known): No data recorded  What Is the Reason for Your Visit/Call Today? Ongoing S/I and polysubstance use  How Long Has This Been Causing You Problems? > than 6 months  What Do You Feel Would Help You the Most Today? Alcohol or Drug Use Treatment   Have You Recently Been in Any Inpatient Treatment (Hospital/Detox/Crisis Center/28-Day Program)? No  Name/Location of Program/Hospital:No data recorded How Long Were You There? No data recorded When Were You Discharged? No data recorded  Have You Ever Received Services From [facility] Before? No  Who Do You See at [facility]? No data recorded  Have You Recently Had Any Thoughts About Hurting Yourself? Yes  Are You Planning to Commit Suicide/Harm Yourself At This time? No   Have you Recently Had Thoughts About [place]? No  Explanation: No data recorded  Have You Used Any Alcohol or Drugs in the Past 24 Hours? No  How Long Ago Did You Use Drugs or Alcohol? No data recorded What Did You Use and How Much? No data recorded  Do You Currently Have a Therapist/Psychiatrist? No  Name of Therapist/Psychiatrist: No data recorded  Have You Been Recently Discharged From Any Office Practice or Programs? No  Explanation of Discharge From Practice/Program: No data recorded    CCA Screening Triage Referral Assessment Type of Contact: Face-to-Face  Is this Initial or Reassessment? No data recorded Date Telepsych consult ordered in [facility]:  No data recorded Time Telepsych consult ordered in [facility]:  No data recorded  Patient Reported Information Reviewed? Yes  Patient Left Without Being Seen? No data recorded Reason for Not Completing Assessment: No data recorded  Collateral Involvement: No data recorded  Does Patient Have a [place]? No data recorded  Name and Contact of Legal Guardian: No data recorded If Minor and Not Living with Parent(s), Who has Custody? No data recorded Is CPS involved or ever been involved? Never  Is APS involved or ever been involved? Never   Patient Determined To Be At Risk for Harm To Self or Others Based on Review of Patient Reported Information or Presenting Complaint? Yes, for Self-Harm  Method: No data recorded Availability of Means: No data recorded Intent: No data recorded Notification Required: No data recorded Additional Information for Danger to Others Potential: No data recorded Additional Comments for Danger to Others Potential: No data recorded Are There Guns or Other Weapons in Your  Home? No data recorded Types of Guns/Weapons: No data recorded Are These Weapons Safely Secured?                            No data recorded Who Could Verify You Are Able To Have These Secured: No data recorded Do You Have any Outstanding Charges, Pending Court Dates, Parole/Probation? No data recorded Contacted To Inform of Risk of Harm To Self or Others: Unable to Contact: (NA)   Location of Assessment: [facility]   Does Patient Present under Involuntary Commitment? No  IVC Papers Initial File Date: No data recorded  [place] of Residence: [name]   Patient Currently Receiving the Following Services: Not Receiving Services   Determination of Need: -- (To be determined)   Options For Referral: Outpatient Therapy     CCA Biopsychosocial Intake/Chief Complaint:  Ongoing S/I and polysubstance use  Current Symptoms/Problems: depression and anxiety   Patient Reported Schizophrenia/Schizoaffective Diagnosis in Past: No   Strengths: Pt is willing to participate in treatment  Preferences: OP care and medication management  Abilities: No data recorded  Type of Services Patient Feels are Needed: No data recorded  Initial Clinical Notes/Concerns: No data recorded  Mental Health Symptoms Depression:  Change in energy/activity   Duration of Depressive symptoms: Greater than two weeks   Mania:  None   Anxiety:   Difficulty concentrating   Psychosis:  None   Duration of Psychotic symptoms: No data recorded  Trauma:  None   Obsessions:  None   Compulsions:  None   Inattention:  None   Hyperactivity/Impulsivity:  N/A   Oppositional/Defiant Behaviors:  None   Emotional Irregularity:  Chronic feelings of emptiness   Other Mood/Personality Symptoms:  On depression    Mental Status Exam Appearance and self-care  Stature:  Average   Weight:  Average weight   Clothing:  Neat/clean   Grooming:  Normal   Cosmetic use:  None   Posture/gait:  Normal   Motor  activity:  Not Remarkable   Sensorium  Attention:  Normal   Concentration:  Normal   Orientation:  X5   Recall/memory:  Normal   Affect and Mood  Affect:  Anxious   Mood:  Depressed   Relating  Eye contact:  Normal   Facial expression:  Anxious   Attitude toward examiner:  Cooperative   Thought and Language  Speech flow: Soft; Slurred   Thought content:  Appropriate to Mood and Circumstances   Preoccupation:  None   Hallucinations:  None   Organization:  No data recorded  [employer] of Knowledge:  Fair   Intelligence:  Average   Abstraction:  Normal   Judgement:  Impaired   Reality Testing:  Realistic   Insight:  Fair   Decision Making:  Confused   Social Functioning  Social Maturity:  Responsible   Social Judgement:  Normal   Stress  Stressors:  Family conflict; Relationship   Coping Ability:  Advice worker Deficits:  Activities of daily living   Supports:  Usual     Religion: Religion/Spirituality Are You A Religious Person?: No  Leisure/Recreation: Leisure / Recreation Do You Have Hobbies?: No  Exercise/Diet: Exercise/Diet Do You Exercise?: No Have You Gained or Lost A Significant Amount of Weight in the Past Six Months?: No Do You Follow a Special Diet?: No Do You Have Any Trouble Sleeping?: No   CCA Employment/Education Employment/Work Situation: Employment / Work Copywriter, advertising Employment situation: Unemployed  Education:     CCA Family/Childhood History Family and Relationship History: Family history Marital status: Single  Childhood History:  Childhood History Did patient suffer any verbal/emotional/physical/sexual abuse as a child?: No Did patient suffer from severe childhood neglect?: No Has patient ever been sexually abused/assaulted/raped as an adolescent or adult?: No Was the patient ever a victim of a crime or a disaster?: No Witnessed domestic violence?: No Has patient been affected by  domestic violence as an adult?: No  Child/Adolescent Assessment:     CCA Substance Use Alcohol/Drug Use: Alcohol / Drug Use Pain Medications: See MAR Prescriptions: See MAR Over the Counter: See MAR History of alcohol / drug use?: Yes Longest period of sobriety (when/how long): Unknown Negative Consequences of Use: Personal relationships Withdrawal Symptoms: Agitation,Sweats,Weakness Substance #1 Name of Substance 1: Alcohol 1 - Age of First Use: 19 1 - Amount (size/oz): Varies 1 - Frequency: Varies 1 - Duration: Ongoing 1 - Last Use / Amount: Prior to arrival on admission 1- Route of Use: Oral Substance #2 Name of Substance 2: Methamphetamine 2 - Age of First Use: recent within the last six months 2 - Amount (size/oz): varies usually 1 gram 2 - Frequency: Daily for the last six months 2 - Duration: Ongoing 2 - Last Use / Amount: Prior to arrival on admission 2 - Route of Substance Use: Nasal/smoking Substance #3 Name of Substance 3: Cocaine 3 - Age of First Use: Recent within the last six months 3 - Amount (size/oz): varies 3 - Frequency: Daily for the last six months 3 - Duration: Ongoing 3 - Last Use / Amount: Prior to arrival on admission 3 - Route of Substance Use: Nasal                   ASAM's:  Six Dimensions of Multidimensional Assessment  Dimension 1:  Acute Intoxication and/or Withdrawal Potential:   Dimension 1:  Description of individual's past and current experiences of substance use and withdrawal: 2  Dimension 2:  Biomedical Conditions and Complications:   Dimension 2:  Description of patient's biomedical conditions and  complications: 2  Dimension 3:  Emotional, Behavioral, or Cognitive Conditions and Complications:  Dimension 3:  Description of emotional, behavioral, or cognitive conditions and complications: 3  Dimension 4:  Readiness to Change:  Dimension 4:  Description of Readiness to Change criteria: 3  Dimension 5:  Relapse, Continued  use, or Continued Problem Potential:  Dimension 5:  Relapse, continued use, or continued problem potential critiera description: 2  Dimension 6:  Recovery/Living Environment:  Dimension 6:  Recovery/Iiving environment criteria description: 2  ASAM Severity Score: ASAM's Severity Rating Score: 14  ASAM Recommended Level of Treatment:     Substance use Disorder (SUD) Substance Use Disorder (SUD)  Checklist Symptoms of Substance Use: Continued use  despite having a persistent/recurrent physical/psychological problem caused/exacerbated by use  Recommendations for Services/Supports/Treatments:    DSM5 Diagnoses: Patient Active Problem List   Diagnosis Date Noted  . 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  . Epididymal cyst 06/14/2020  . Unintentional weight loss 06/14/2020  . Abnormal chest x-ray 06/14/2020  . Elevated blood pressure reading without diagnosis of hypertension 05/27/2020  . Influenza vaccination declined 05/27/2020  . AKI (acute kidney injury) ([place]) 01/01/2020  . Vomiting 01/01/2020  . Cocaine abuse ([place]) 01/01/2020  . Alcohol abuse 01/01/2020  . Ileitis 02/18/2018  . HYPERGLYCEMIA 11/01/2009  . Tobacco dependence 07/18/2007  . SUBSTANCE ABUSE, MULTIPLE 07/18/2007  . ALLERGIC RHINITIS 07/18/2007  . ASTHMA 07/18/2007  . GERD 07/18/2007  . Crohn's disease ([place]) 07/18/2007    Patient Centered Plan: Patient is on the following Treatment Plan(s): Referrals to Alternative Service(s): Referred to Alternative Service(s):   Place:   Date:   Time:    Referred to Alternative Service(s):   Place:   Date:   Time:    Referred to Alternative Service(s):   Place:   Date:   Time:    Referred to Alternative Service(s):   Place:   Date:   Time:     Mamie Nick, LCAS

## 2020-07-31 DIAGNOSIS — G9340 Encephalopathy, unspecified: Secondary | ICD-10-CM

## 2020-07-31 DIAGNOSIS — F15959 Other stimulant use, unspecified with stimulant-induced psychotic disorder, unspecified: Principal | ICD-10-CM

## 2020-07-31 DIAGNOSIS — F1910 Other psychoactive substance abuse, uncomplicated: Secondary | ICD-10-CM

## 2020-07-31 LAB — CK: Total CK: 603 U/L — ABNORMAL HIGH (ref 49–397)

## 2020-07-31 LAB — COMPREHENSIVE METABOLIC PANEL
ALT: 19 U/L (ref 0–44)
AST: 30 U/L (ref 15–41)
Albumin: 3.1 g/dL — ABNORMAL LOW (ref 3.5–5.0)
Alkaline Phosphatase: 49 U/L (ref 38–126)
Anion gap: 7 (ref 5–15)
BUN: 9 mg/dL (ref 6–20)
CO2: 23 mmol/L (ref 22–32)
Calcium: 8.1 mg/dL — ABNORMAL LOW (ref 8.9–10.3)
Chloride: 104 mmol/L (ref 98–111)
Creatinine, Ser: 0.63 mg/dL (ref 0.61–1.24)
GFR, Estimated: >60 mL/min (ref >60)
Glucose, Bld: 135 mg/dL — ABNORMAL HIGH (ref 70–99)
Potassium: 3.1 mmol/L — ABNORMAL LOW (ref 3.5–5.1)
Sodium: 134 mmol/L — ABNORMAL LOW (ref 135–145)
Total Bilirubin: 0.7 mg/dL (ref 0.3–1.2)
Total Protein: 5.6 g/dL — ABNORMAL LOW (ref 6.5–8.1)

## 2020-07-31 LAB — MAGNESIUM: Magnesium: 1.9 mg/dL (ref 1.7–2.4)

## 2020-07-31 LAB — PHOSPHORUS: Phosphorus: 2.7 mg/dL (ref 2.5–4.6)

## 2020-07-31 MED ORDER — GABAPENTIN 300 MG PO CAPS
300.0000 mg | ORAL_CAPSULE | Freq: Two times a day (BID) | ORAL | Status: DC
  Administered 2020-07-31 – 2020-08-02 (×5): 300 mg via ORAL
  Filled 2020-07-31 (×5): qty 1

## 2020-07-31 MED ORDER — POTASSIUM CHLORIDE CRYS ER 20 MEQ PO TBCR
40.0000 meq | EXTENDED_RELEASE_TABLET | ORAL | Status: AC
Start: 2020-07-31 — End: 2020-07-31
  Administered 2020-07-31 (×2): 40 meq via ORAL
  Filled 2020-07-31 (×2): qty 2

## 2020-07-31 NOTE — Progress Notes (Signed)
PROGRESS NOTE    Brendan Ochoa  ZSW:[id number] DOB: Jun 15, 1975 DOA: 07/28/2020 PCP: Ladell Pier, MD    Chief Complaint  Patient presents with  . Suicidal    Brief Narrative:  Brendan Ochoa is a 46 y.o. male with medical history significant of with past medical history significant of polysubstance use, history of Crohn's, asthma, gastroesophageal flux disease and prior hx of alcohol abuse; who presented to the emergency department secondary to not feeling good and having vague suicidal thoughts.  Patient reports using cocaine: Heroine, marijuana and amphetamine recently in binge manners after breaking up with his girlfriend. Patient denied to EDP CP, SOB, nausea, vomiting, dysuria, focal weakness or overt bleeding.   Patient symptoms deteriorated, behavior became combative and very agitated. Not following commands and essentially in danger to himself and staff. Restrains applied, geodon and benzodiazepine given.   Patient is hemodynamically stable, no following commands and at the time of my examination very obtunded and sleeping.  Protecting airways.  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 -Currently patient is not agitated or combative; following commands and denied suicidal thoughts. -Anxious and restless on examination -Reports ongoing binge of polysubstance use since after breaking up with his girlfriend for approximately 7 days straight prior to admission. -Continue as needed benzodiazepines, CIWA monitoring and as needed Haldol. -Following recommendations by psychiatry service patient has been started Neurontin and if needed will also add the use of Benadryl; no significant agitation at this moment and trying to prevent oversedation. -From internal medicine standpoint patient has been entirely stabilized and is ready to be transferred to inpatient psychiatry facility when bed available for further management.  2-history of  asthma -No wheezing, no shortness of breath complaints -Continue as needed bronchodilators.  3-gastroesophageal for disease -Continue PPI.  4-right upper extremity cellulitis -Apply cold compresses, keep leg elevated -Short course of oral doxycycline initiated. -ibuprofen has been ordered to assist with pain and swelling.  5-mild rhabdomyolysis -Nontraumatic -Continue IV fluids and maintain adequate hydration.  6-hypokalemia -Will replete electrolytes and follow trend.   DVT prophylaxis: Heparin Code Status: Full code Family Communication: No family at bedside. Disposition:   Status is: Inpatient  Dispo: The patient is from: Home              Anticipated d/c is to: To be determined              Patient currently medically stable and overall improving in his psychotic presentation.  From internal medicine standpoint he is medically clear for discharge to psychiatry facility, for inpatient treatment as recommended by psychiatry service.   Difficult to place patient no     Consultants:   Psychiatry service   Procedures:  See below for x-ray reports.   Antimicrobials:  Doxycycline   Subjective: No fever, no chest pain, no nausea, no vomiting.  Patient is hemodynamically stable and denying suicidal ideation currently.  No overnight events, no episodes of agitation or combativeness reported.  Objective: Vitals:   07/30/20 1600 07/30/20 2034 07/31/20 0612 07/31/20 1432  BP: (!) 164/138 130/81 119/69 125/75  Pulse:    79  Resp: (!) 24 18 16 18   Temp: 98.3 F (36.8 C) 98.3 F (36.8 C) 98 F (36.7 C) 98.2 F (36.8 C)  TempSrc:  Oral Oral Oral  SpO2:  100% 100% 99%  Weight:      Height:  5' 11"  (1.803 m)      Intake/Output Summary (Last 24 hours) at  07/31/2020 1623 Last data filed at 07/31/2020 0730 Gross per 24 hour  Intake 1820 ml  Output 2300 ml  Net -480 ml   Filed Weights   07/28/20 1952 07/29/20 1344  Weight: 70.6 kg 66.3 kg     Examination: General exam: Alert, awake, oriented x 3; following commands appropriately continuing to set ideation currently.  Expressed being less restless and there is no signs of agitation or combativeness currently.  Continues to demonstrate anxious behavior. Respiratory system: Clear to auscultation. Respiratory effort normal.  No using accessory muscles Cardiovascular system:RRR. No murmurs, rubs, gallops.  No JVD Gastrointestinal system: Abdomen is nondistended, soft and nontender. No organomegaly or masses felt. Normal bowel sounds heard. Central nervous system: Alert and oriented. No focal neurological deficits. Extremities: No cyanosis or clubbing; right upper extremity redness and swelling significantly improved.  Patient expressed very mild pain. Skin: No petechiae. Psychiatry: Mood & affect appropriate currently; denies suicidal ideation..     Data Reviewed: I have personally reviewed following labs and imaging studies  CBC: Recent Labs  Lab 07/28/20 2042 07/30/20 0351  WBC 8.4 6.3  NEUTROABS 5.7  --   HGB 15.2 13.8  HCT 44.6 41.1  MCV 88.7 90.9  PLT 339 850    Basic Metabolic Panel: Recent Labs  Lab 07/28/20 2042 07/29/20 0634 07/29/20 1849 07/30/20 0351 07/31/20 0446  NA 134* 137  --  137 134*  K 3.7 3.7  --  3.4* 3.1*  CL 102 103  --  107 104  CO2 19* 21*  --  21* 23  GLUCOSE 103* 135*  --  98 135*  BUN 9 15  --  15 9  CREATININE 0.81 0.96  --  0.72 0.63  CALCIUM 9.3 9.8  --  8.4* 8.1*  MG  --   --  2.1  --  1.9  PHOS  --   --  4.5  --  2.7   GFR: Estimated Creatinine Clearance: 112.8 mL/min (by C-G formula based on SCr of 0.63 mg/dL).  Liver Function Tests: Recent Labs  Lab 07/28/20 2042 07/31/20 0446  AST 24 30  ALT 18 19  ALKPHOS 68 49  BILITOT 1.1 0.7  PROT 7.6 5.6*  ALBUMIN 4.3 3.1*    CBG: No results for input(s): GLUCAP in the last 168 hours.   Recent Results (from the past 240 hour(s))  Resp Panel by RT-PCR (Flu A&B,  Covid) Nasopharyngeal Swab     Status: None   Collection Time: 07/28/20  9:46 PM   Specimen: Nasopharyngeal Swab; Nasopharyngeal(NP) swabs in vial transport medium  Result Value Ref Range Status   SARS Coronavirus 2 by RT PCR NEGATIVE NEGATIVE Final    Comment: (NOTE) SARS-CoV-2 target nucleic acids are NOT DETECTED.  The SARS-CoV-2 RNA is generally detectable in upper respiratory specimens during the acute phase of infection. The lowest concentration of SARS-CoV-2 viral copies this assay can detect is 138 copies/mL. A negative result does not preclude SARS-Cov-2 infection and should not be used as the sole basis for treatment or other patient management decisions. A negative result may occur with  improper specimen collection/handling, submission of specimen other than nasopharyngeal swab, presence of viral mutation(s) within the areas targeted by this assay, and inadequate number of viral copies(<138 copies/mL). A negative result must be combined with clinical observations, patient history, and epidemiological information. The expected result is Negative.  Fact Sheet for Patients:  EntrepreneurPulse.com.au  Fact Sheet for Healthcare Providers:  IncredibleEmployment.be  This test is no  t yet approved or cleared by the [place] and  has been authorized for detection and/or diagnosis of SARS-CoV-2 by FDA under an Emergency Use Authorization (EUA). This EUA will remain  in effect (meaning this test can be used) for the duration of the COVID-19 declaration under Section 564(b)(1) of the Act, 21 U.S.C.section 360bbb-3(b)(1), unless the authorization is terminated  or revoked sooner.       Influenza A by PCR NEGATIVE NEGATIVE Final   Influenza B by PCR NEGATIVE NEGATIVE Final    Comment: (NOTE) The Xpert Xpress SARS-CoV-2/FLU/RSV plus assay is intended as an aid in the diagnosis of influenza from Nasopharyngeal swab specimens and should  not be used as a sole basis for treatment. Nasal washings and aspirates are unacceptable for Xpert Xpress SARS-CoV-2/FLU/RSV testing.  Fact Sheet for Patients: EntrepreneurPulse.com.au  Fact Sheet for Healthcare Providers: IncredibleEmployment.be  This test is not yet approved or cleared by the [place] FDA and has been authorized for detection and/or diagnosis of SARS-CoV-2 by FDA under an Emergency Use Authorization (EUA). This EUA will remain in effect (meaning this test can be used) for the duration of the COVID-19 declaration under Section 564(b)(1) of the Act, 21 U.S.C. section 360bbb-3(b)(1), unless the authorization is terminated or revoked.  Performed at [facility], [address], [place], [place] [address]   MRSA PCR Screening     Status: None   Collection Time: 07/29/20  1:42 PM   Specimen: Nasal Mucosa; Nasopharyngeal  Result Value Ref Range Status   MRSA by PCR NEGATIVE NEGATIVE Final    Comment:        The GeneXpert MRSA Assay (FDA approved for NASAL specimens only), is one component of a comprehensive MRSA colonization surveillance program. It is not intended to diagnose MRSA infection nor to guide or monitor treatment for MRSA infections. Performed at [facility], [address] [place], [place] [address]      Radiology Studies: No results found.   Scheduled Meds: . Chlorhexidine Gluconate Cloth  6 each Topical Daily  . doxycycline  100 mg Oral Q12H  . folic acid  1 mg Oral Daily  . gabapentin  300 mg Oral BID  . heparin injection (subcutaneous)  5,000 Units Subcutaneous Q8H  . ibuprofen  400 mg Oral TID  . multivitamin with minerals  1 tablet Oral Daily  . nicotine  21 mg Transdermal Daily  . pantoprazole  40 mg Oral Daily  . thiamine  100 mg Oral Daily   Or  . thiamine  100 mg Intravenous Daily   Continuous Infusions: . sodium chloride 100 mL/hr at 07/31/20 0006     LOS: 2 days    Time spent:  30 minutes  Barton Dubois, MD [facility]   To contact the attending provider between 7A-7P or the covering provider during after hours 7P-7A, please log into the web site [email or web address] and access using universal [place] password for that web site. If you do not have the password, please call the hospital operator.  07/31/2020, 4:23 PM

## 2020-07-31 NOTE — BHH Counselor (Signed)
Pt is a 45 year old male who was transported to [facility] due to suicidal ideation and gesture (made a noose out of an extension cord, had it around his neck per report).  Pt endorsed despondency following break-up with his girlfriend, as well as substance use -- cocaine, meth, heroin.    Per assessment:  Patient presents with ongoing S/I after he was found with a noose around his neck prior to arrival. Patient currently denies any S/I, H/I or AVH. Patient states he has been using multiple substances daily to include: methamphetamines in various amounts, cocaine and alcohol daily for the last 7 to 10 days. Patient is observed to be drowsy and renders limited history in reference to patterns of use, amounts used and time frame.   Pt was reassessed this AM.  He was sitting upright and speaking his mother who was present.  Pt admitted that he felt suicidal yesterday but stated today that he is not suicidal.  ''I'm doing much better.''  Pt stated that he ''acted crazy'' on 3/19 due to break-up.  ''I went on a bender.''  Pt acknowledged using an unknown quantity of cocaine, meth, and heroin.  Pt stated that he doe snot have an outpatient psychiatrist at this time.  Consulted with Molly Maduro, NP, who confirmed that Pt continues to meet inpatient criteria.

## 2020-07-31 NOTE — Progress Notes (Signed)
Patient was walking in hall with SI sitter. Patient darted to the stairwell and SI sitter and this nurse stood with patient as he voiced his fears of "being in a psych place and not getting treatment". As patient was standing in stairwell and two staff members were trying to convince him to go back to his room, MD walked by and down stairwell. Patient eventually was convinced to return to room. Patient remained in room calmly with SI sitter.

## 2020-07-31 NOTE — Progress Notes (Addendum)
At approximately 1715 patient told SI sitter that he was going to leave. Patient continually stated throughout the day that he "wanted to go outside for some fresh air". SI sitter talked with patient and patient was calm. SI sitter and staff tech were in room and patient lunged toward both staff members and suddenly, quickly charged out of the room and down the stairwell. Secretary called security immediately and several staff members ran after him in attempts to catch him. Patient made it to the ground floor and exited and proceeded to cross the street to the [facility], jump over multiple fences. Staff members were in pursuit however could not keep up with him. SI sitter called the [place] office to let local patrol know to look out for him. [employer] security also notified police to be on patrol. Patient was seen by bystanders sitting hiding out in a backyard, staff members were in the area and patient calmly obeyed orders to come with staff back to hospital. Local police officer cuffed him and escorted him back to hospital. Patient is now resting in his room with SI sitter. MD was made aware of this situation.   AC notified the [facility] person on call about this event.  Safety Zone Portal submitted.

## 2020-07-31 NOTE — Progress Notes (Signed)
Per Nanine Means, DNP, patient meets criteria for inpatient treatment. There are no available or appropriate beds at [facility] today. CSW faxed referrals to the following facilities for review:  [facility]  [place] [facility]   TTS will continue to seek bed placement.  Crissie Reese, MSW, LCSW-A, LCAS-A Phone: [phone] Disposition/TOC

## 2020-07-31 NOTE — Progress Notes (Signed)
I have been contacted by patient's daughters to inform that he had a lengthy hospital disregarding safety sitter instructions and advices. At this moment he is medically stable, but not psychiatry clear and by that IVC. Patient is in need of police officer/security to enforced proper behavior and to wait for available bed at psychiatry facility to be discharge to.  Of note, no orders found on Epic for police officer sit; case discussed with bedside RN and AC.  Barton Dubois MD [phone]

## 2020-07-31 NOTE — Progress Notes (Signed)
At [employer] sitter and nurse tech was in patients room. Patient stated to sitter that he was going to leave. Sitter talked to patient in the attempted to keep him in his room. Sitter stated patient lunged  at her and the patient took off in the stair well. Secretary called security. This nurse, nurse tech and sitter went after patient down the stair well to the ground floor. Once patient reached ground floor he exited through an exit door went in into the patio then ran up the stair outside main entrance this nurse called charge nurse. The sitter called [place] PD stated that a IVC patient fled the hospital on foot. This nurse informed AC. Patient ran across street toward the [facility]. Jumped multiple fences running through backyards. Staff was unable to keep up with patient. bystanders were walking side walk and spotted patient sitting hiding out in a private backyard staff was able to approach patient. Patient calmly obeyed to walk back with staff to hospital. [place] PD saw staff with patient. PD cuffed patient and escorted patient back to hospital. Patient resting in bed with SI sitter in room. MD aware of situation.

## 2020-08-01 MED ORDER — POTASSIUM CHLORIDE CRYS ER 20 MEQ PO TBCR
40.0000 meq | EXTENDED_RELEASE_TABLET | Freq: Every day | ORAL | Status: DC
  Administered 2020-08-01 – 2020-08-02 (×2): 40 meq via ORAL
  Filled 2020-08-01 (×2): qty 2

## 2020-08-01 NOTE — Consult Note (Signed)
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 male patient that was admitted to [facility] ED under IVC after ongoing suicide ideations after he was found with a noose around his neck prior to his admission.  HPI:    Brendan Ochoa is a 45 year old male with a past psychiatric history significant for polysubstance abuse and stimulant induced psychosis who is seen via telehealth visit by this provider, consulted with Dr. Mallie Darting, and chart reviewed on 08/01/2020. On evaluation, patient states that he is a little aggravated because he is been in the ED for awhile and he feels like the walls are closing in on him. Patient reports that he has had a hard time struggling due to breaking up with his long time girlfriend. Patient reports that after he broke up with his girlfriend, he stared using a variety of drugs including meth, coke, and hroin for 7 days before abruptly stopping causing him to go into serious withdrawal symptoms. Patient reports that he threatened to kill himself but does not recall ever putting a noose around his neck. Patient states that he threw an extension cord into the tree but never wrapped a noose around his neck. Patient states that he would never willing hurt himself. He reports that he wishes he could have his girlfriend back but states that she is still using.  Patient denies suicidal or homicidal ideations and states that he is just sad over  missing his girlfriend. Patient denies auditory or visual hallucinations and does not appear to be responding to internal/external stimuli. Patient denies paranoia. He reports that he has been battling addiction for over 20 years and has experienced fleeting moments of sobriety only to be done in by giving into his addiction.  Past Psychiatric History:  Substance induced psychosis  Risk to Self: Yes Risk to Others: No Prior Inpatient Therapy: Yes Prior Outpatient Therapy: No  Past Medical History:  Past Medical History:  Diagnosis Date  . Asthma   . Cocaine abuse ([place])    in past  . Crohn disease ([place]) 2008  . Depression    in past, not current as of 2019   . ETOH abuse    history of alcohol use  . GERD (gastroesophageal reflux disease)     Past Surgical History:  Procedure Laterality Date  . BIOPSY  10/15/2017   Procedure: BIOPSY;  Surgeon: Danie Binder, MD;  Location: [facility] SUITE;  Service: Endoscopy;;  terminal ileum colon  . COLONOSCOPY WITH PROPOFOL N/A 10/15/2017   redundant colon, normal rectum, external and internal hemorrhoids, quiescent crohn's. Pathology: inactive chronicn nonspecific ileitis  . ESOPHAGOGASTRODUODENOSCOPY (EGD) WITH PROPOFOL N/A 10/15/2017   normal esophagus, small hiatal hernia, gastritis, duodenitis  . FINGER AMPUTATION Right 2012   cut finger table saw and surgically amputated   Family History:  Family History  Problem Relation Age of Onset  . Stomach cancer Maternal Grandfather   . Colon cancer Neg Hx    Family Psychiatric  History: Not reported Social History:  Social History   Substance and  Sexual Activity  Alcohol Use Yes   Comment: occasionally     Social History   Substance and Sexual Activity  Drug Use Yes  . Types: Cocaine   Comment: last use 2 days ago    Social History   Socioeconomic History  . Marital status: Single    Spouse name: Not on file  . Number of children: Not on file  . Years of education: Not on file   . Highest education level: Not on file  Occupational History  . Not on file  Tobacco Use  . Smoking status: Current Every Day Smoker    Packs/day: 0.25    Years: 15.00    Pack years: 3.75    Types: Cigarettes  . Smokeless tobacco: Never Used  . Tobacco comment: 1/4 to 1/2 pack per day   Vaping Use  . Vaping Use: Never used  Substance and Sexual Activity  . Alcohol use: Yes    Comment: occasionally  . Drug use: Yes    Types: Cocaine    Comment: last use 2 days ago  . Sexual activity: Not on file  Other Topics Concern  . Not on file  Social History Narrative  . Not on file   Social Determinants of Health   Financial Resource Strain: Not on file  Food Insecurity: Not on file  Transportation Needs: Not on file  Physical Activity: Not on file  Stress: Not on file  Social Connections: Not on file   Additional Social History:    Allergies:  No Known Allergies  Labs:  Results for orders placed or performed during the hospital encounter of 07/28/20 (from the past 48 hour(s))  Comprehensive metabolic panel     Status: Abnormal   Collection Time: 07/31/20  4:46 AM  Result Value Ref Range   Sodium 134 (L) 135 - 145 mmol/L   Potassium 3.1 (L) 3.5 - 5.1 mmol/L   Chloride 104 98 - 111 mmol/L   CO2 23 22 - 32 mmol/L   Glucose, Bld 135 (H) 70 - 99 mg/dL    Comment: Glucose reference range applies only to samples taken after fasting for at least 8 hours.   BUN 9 6 - 20 mg/dL   Creatinine, Ser 0.63 0.61 - 1.24 mg/dL   Calcium 8.1 (L) 8.9 - 10.3 mg/dL   Total Protein 5.6 (L) 6.5 - 8.1 g/dL   Albumin 3.1 (L) 3.5 - 5.0 g/dL   AST 30 15 - 41 U/L   ALT 19 0 - 44 U/L   Alkaline Phosphatase 49 38 - 126 U/L   Total Bilirubin 0.7 0.3 - 1.2 mg/dL   GFR, Estimated >60 >60 mL/min    Comment: (NOTE) Calculated using the CKD-EPI Creatinine Equation (2021)    Anion gap 7 5 - 15    Comment: Performed at [facility], [address], [place], [place] [address]  Magnesium     Status:  None   Collection Time: 07/31/20  4:46 AM  Result Value Ref Range   Magnesium 1.9 1.7 - 2.4 mg/dL    Comment: Performed at [facility], [address] [place], [place] [address]  Phosphorus     Status: None   Collection Time: 07/31/20  4:46 AM  Result Value Ref Range   Phosphorus 2.7 2.5 - 4.6 mg/dL    Comment: Performed at [facility], [address], [place], [place] [address]  CK     Status: Abnormal   Collection Time: 07/31/20  4:46 AM  Result Value Ref Range   Total CK 603 (H) 49 - 397 U/L    Comment: Performed at [facility], [address] [place], [place] [address]    Medications:  Current Facility-Administered Medications  Medication Dose Route Frequency Provider Last Rate Last Admin  . 0.9 %  sodium chloride infusion   Intravenous Continuous Barton Dubois, MD 100 mL/hr at 07/31/20 0006 New Bag at 07/31/20 0006  . acetaminophen (TYLENOL) tablet 650 mg  650 mg Oral Q4H PRN Barton Dubois, MD   650 mg at 08/01/20 0526  . albuterol (VENTOLIN HFA) 108 (90 Base) MCG/ACT inhaler 2 puff  2 puff Inhalation Q6H PRN Barton Dubois, MD      . alum & mag hydroxide-simeth (MAALOX/MYLANTA) 200-200-20 MG/5ML suspension 30 mL  30 mL Oral Q6H PRN Barton Dubois, MD      . Chlorhexidine Gluconate Cloth 2 % PADS 6 each  6 each Topical Daily Barton Dubois, MD   6 each at 07/30/20 1054  . doxycycline (VIBRA-TABS) tablet 100 mg  100 mg Oral Q12H Barton Dubois, MD   100 mg at 08/01/20 0836  . folic acid (FOLVITE) tablet 1 mg  1 mg Oral Daily Barton Dubois, MD   1 mg at 08/01/20 7893  . gabapentin (NEURONTIN) capsule 300 mg  300 mg Oral BID Barton Dubois, MD   300 mg at 08/01/20 0834  . haloperidol lactate (HALDOL) injection 5 mg  5 mg Intravenous Q6H PRN Barton Dubois, MD   5 mg at 07/30/20 2220  . heparin injection 5,000 Units  5,000 Units Subcutaneous Q8H Barton Dubois, MD   5,000 Units at 08/01/20 1430  . LORazepam (ATIVAN) tablet 1-4 mg  1-4 mg Oral Q1H PRN Barton Dubois, MD       Or   . LORazepam (ATIVAN) injection 1-4 mg  1-4 mg Intravenous Q1H PRN Barton Dubois, MD   1 mg at 08/01/20 1057  . multivitamin with minerals tablet 1 tablet  1 tablet Oral Daily Barton Dubois, MD   1 tablet at 08/01/20 [phone]  . nicotine (NICODERM CQ - dosed in mg/24 hours) patch 21 mg  21 mg Transdermal Daily Barton Dubois, MD   21 mg at 08/01/20 0836  . ondansetron (ZOFRAN) tablet 4 mg  4 mg Oral Q6H PRN Barton Dubois, MD       Or  . ondansetron [facility]) injection 4 mg  4 mg Intravenous Q6H PRN Barton Dubois, MD      . pantoprazole (PROTONIX) EC tablet 40 mg  40 mg Oral Daily Barton Dubois, MD   40 mg at 08/01/20 0835  . thiamine tablet 100 mg  100 mg Oral Daily Barton Dubois, MD   100 mg at 08/01/20 7510   Or  . thiamine (B-1) injection 100 mg  100 mg Intravenous Daily Barton Dubois, MD      . zolpidem Lorrin Mais) tablet 5 mg  5 mg Oral QHS PRN Barton Dubois, MD   5 mg at 07/31/20 2057    Musculoskeletal: Strength & Muscle Tone: within normal limits Gait & Station: normal Patient leans: N/A  Psychiatric Specialty Exam: Physical Exam Constitutional:      Appearance: Normal appearance.  HENT:     Head: Normocephalic and atraumatic.     Nose: Nose normal.  Cardiovascular:     Rate and Rhythm: Normal rate.  Pulmonary:     Effort: Pulmonary effort is normal.  Musculoskeletal:        General: Normal range of motion.  Cervical back: Normal range of motion and neck supple.  Neurological:     General: No focal deficit present.     Mental Status: He is alert and oriented to person, place, and time.  Psychiatric:        Attention and Perception: Attention and perception normal. He does not perceive auditory or visual hallucinations.        Mood and Affect: Mood is anxious and depressed. Affect is tearful.        Speech: Speech is tangential.        Behavior: Behavior is agitated. Behavior is not hyperactive. Behavior is cooperative.        Thought Content: Thought content  normal. Thought content does not include homicidal or suicidal ideation. Thought content does not include suicidal plan.        Cognition and Memory: Cognition and memory normal.        Judgment: Judgment normal.     Review of Systems  Constitutional: Negative.   HENT: Negative.   Eyes: Negative.   Respiratory: Negative.   Cardiovascular: Negative.   Gastrointestinal: Negative.   Genitourinary: Negative.   Musculoskeletal: Negative.   Skin: Negative.   Neurological: Negative.   Psychiatric/Behavioral: Positive for agitation and dysphoric mood. Negative for decreased concentration, hallucinations, self-injury, sleep disturbance and suicidal ideas. The patient is nervous/anxious. The patient is not hyperactive.     Blood pressure 130/76, pulse 66, temperature 98.6 F (37 C), temperature source Oral, resp. rate 17, height 5' 11"  (1.803 m), weight 66.3 kg, SpO2 100 %.Body mass index is 20.39 kg/m.  General Appearance: Fairly Groomed  Eye Contact:  Good  Speech:  Clear and Coherent and Normal Rate  Volume:  Normal  Mood:  Anxious, Dysphoric and Irritable  Affect:  Congruent, Depressed and Tearful  Thought Process:  Coherent and Descriptions of Associations: Tangential  Orientation:  Full (Time, Place, and Person)  Thought Content:  WDL, Rumination and Tangential  Suicidal Thoughts:  No  Homicidal Thoughts:  No  Memory:  Immediate;   Fair Recent;   Fair Remote;   Fair  Judgement:  Fair  Insight:  Lacking  Psychomotor Activity:  Normal  Concentration:  Concentration: Good and Attention Span: Good  Recall:  [employer] of Knowledge:  Fair  Language:  Good  Akathisia:  NA  Handed:  Right  AIMS (if indicated):     Assets:  Communication Skills Desire for Improvement  ADL's:  Intact  Cognition:  WNL  Sleep:        Treatment Plan Summary: Plan Patient meets criteria for psychiatric inpatient treatment  Disposition: Recommend psychiatric Inpatient admission when medically  cleared. Supportive therapy provided about ongoing stressors.  This service was provided via telemedicine using a 2-way, interactive audio and video technology.  Names of all persons participating in this telemedicine service and their role in this encounter. Name: Vanessa Kick [facility] Role: PA-C  Name: Dr. Sharma Covert Role: Psychiatrist  Name: Brendan Ochoa Role: Patient  Name: Dallie Piles Role: RN    Malachy Mood, [place] 08/01/2020 2:58 PM

## 2020-08-01 NOTE — Progress Notes (Signed)
PROGRESS NOTE    Brendan Ochoa  VWU:[id number] DOB: [birth year] DOA: 07/28/2020 PCP: Ladell Pier, MD    Chief Complaint  Patient presents with  . Suicidal    Brief Narrative:  Brendan Ochoa is a 45 y.o. male with medical history significant of with past medical history significant of polysubstance use, history of Crohn's, asthma, gastroesophageal flux disease and prior hx of alcohol abuse; who presented to the emergency department secondary to not feeling good and having vague suicidal thoughts.  Patient reports using cocaine: Heroine, marijuana and amphetamine recently in binge manners after breaking up with his girlfriend. Patient denied to EDP CP, SOB, nausea, vomiting, dysuria, focal weakness or overt bleeding.   Patient symptoms deteriorated, behavior became combative and very agitated. Not following commands and essentially in danger to himself and staff. Restrains applied, geodon and benzodiazepine given.   Patient is hemodynamically stable, no following commands and at the time of my examination very obtunded and sleeping.  Protecting airways.  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 -Currently patient is not agitated or combative; following commands and denied suicidal thoughts. -Anxious and restless on examination -Reports ongoing binge of polysubstance use since after breaking up with his girlfriend for approximately 7 days straight prior to admission. -Continue as needed benzodiazepines, CIWA monitoring and as needed Haldol. -Following recommendations by psychiatry service patient has been started Neurontin and if needed will also add the use of Benadryl; no significant agitation at this moment and trying to prevent oversedation. -From internal medicine standpoint patient has been entirely stabilized and is ready to be transferred to inpatient psychiatry facility when bed available for further management.  2-history of  asthma -No wheezing, no shortness of breath complaints -Continue as needed bronchodilators.  3-gastroesophageal for disease -Continue PPI.  4-right upper extremity cellulitis -Apply cold compresses, keep leg elevated -Short course of oral doxycycline initiated (planning for a total of 7 days course). -ibuprofen has been ordered to assist with pain and swelling.  5-mild rhabdomyolysis -Nontraumatic -Continue IV fluids and maintain adequate hydration.  6-hypokalemia -Continue repletion as needed and use daily maintenance. -Will follow electrolytes trend.   DVT prophylaxis: Heparin Code Status: Full code Family Communication: No family at bedside. Disposition:   Status is: Inpatient  Dispo: The patient is from: Home              Anticipated d/c is to: To be determined              Patient currently medically stable and overall improving in his psychotic presentation.  From internal medicine standpoint he is medically clear for discharge to psychiatry facility, for inpatient treatment as recommended by psychiatry service.   Difficult to place patient no     Consultants:   Psychiatry service   Procedures:  See below for x-ray reports.   Antimicrobials:  Doxycycline   Subjective: Chest today patient ran away from the hospital, was able to be escorted back into his room by nursing staff.  Patient currently in agreement to go to psychiatric facility and also interested in detox placement after valve.  Objective: Vitals:   07/31/20 1432 07/31/20 2135 08/01/20 0538 08/01/20 1405  BP: 125/75 136/70 133/82 130/76  Pulse: 79 64 61 66  Resp: 18 18 16 17   Temp: 98.2 F (36.8 C) 97.6 F (36.4 C)  98.6 F (37 C)  TempSrc: Oral   Oral  SpO2: 99% 100% 100% 100%  Weight:  Height:        Intake/Output Summary (Last 24 hours) at 08/01/2020 1644 Last data filed at 08/01/2020 1234 Gross per 24 hour  Intake 960 ml  Output -  Net 960 ml   Filed Weights   07/28/20  1952 07/29/20 1344  Weight: 70.6 kg 66.3 kg    Examination: General exam: Alert, awake, oriented x 3, answering questions appropriately. Suicidal thoughts currently and expressing no acute complaints.  No agitation, no combativeness, following commands appropriately. Respiratory system: Clear to auscultation. Respiratory effort normal.  No requiring oxygen supplementation. Cardiovascular system:RRR. No murmurs, rubs, gallops. Gastrointestinal system: Abdomen is nondistended, soft and nontender. No organomegaly or masses felt. Normal bowel sounds heard. Central nervous system: Alert and oriented. No focal neurological deficits. Extremities: No cyanosis or clubbing. Skin: No petechiae.;  Almost completely resolved erythematous changes on the right upper extremity.  Expressing very mild pain with movement and no longer having swelling. Psychiatry:  Mood & affect appropriate currently.     Data Reviewed: I have personally reviewed following labs and imaging studies  CBC: Recent Labs  Lab 07/28/20 2042 07/30/20 0351  WBC 8.4 6.3  NEUTROABS 5.7  --   HGB 15.2 13.8  HCT 44.6 41.1  MCV 88.7 90.9  PLT 339 166    Basic Metabolic Panel: Recent Labs  Lab 07/28/20 2042 07/29/20 0634 07/29/20 1849 07/30/20 0351 07/31/20 0446  NA 134* 137  --  137 134*  K 3.7 3.7  --  3.4* 3.1*  CL 102 103  --  107 104  CO2 19* 21*  --  21* 23  GLUCOSE 103* 135*  --  98 135*  BUN 9 15  --  15 9  CREATININE 0.81 0.96  --  0.72 0.63  CALCIUM 9.3 9.8  --  8.4* 8.1*  MG  --   --  2.1  --  1.9  PHOS  --   --  4.5  --  2.7   GFR: Estimated Creatinine Clearance: 112.8 mL/min (by C-G formula based on SCr of 0.63 mg/dL).  Liver Function Tests: Recent Labs  Lab 07/28/20 2042 07/31/20 0446  AST 24 30  ALT 18 19  ALKPHOS 68 49  BILITOT 1.1 0.7  PROT 7.6 5.6*  ALBUMIN 4.3 3.1*    CBG: No results for input(s): GLUCAP in the last 168 hours.  Recent Results (from the past 240 hour(s))  Resp  Panel by RT-PCR (Flu A&B, Covid) Nasopharyngeal Swab     Status: None   Collection Time: 07/28/20  9:46 PM   Specimen: Nasopharyngeal Swab; Nasopharyngeal(NP) swabs in vial transport medium  Result Value Ref Range Status   SARS Coronavirus 2 by RT PCR NEGATIVE NEGATIVE Final    Comment: (NOTE) SARS-CoV-2 target nucleic acids are NOT DETECTED.  The SARS-CoV-2 RNA is generally detectable in upper respiratory specimens during the acute phase of infection. The lowest concentration of SARS-CoV-2 viral copies this assay can detect is 138 copies/mL. A negative result does not preclude SARS-Cov-2 infection and should not be used as the sole basis for treatment or other patient management decisions. A negative result may occur with  improper specimen collection/handling, submission of specimen other than nasopharyngeal swab, presence of viral mutation(s) within the areas targeted by this assay, and inadequate number of viral copies(<138 copies/mL). A negative result must be combined with clinical observations, patient history, and epidemiological information. The expected result is Negative.  Fact Sheet for Patients:  EntrepreneurPulse.com.au  Fact Sheet for Healthcare Providers:  IncredibleEmployment.be  This test is no t yet approved or cleared by the [place] and  has been authorized for detection and/or diagnosis of SARS-CoV-2 by FDA under an Emergency Use Authorization (EUA). This EUA will remain  in effect (meaning this test can be used) for the duration of the COVID-19 declaration under Section 564(b)(1) of the Act, 21 U.S.C.section 360bbb-3(b)(1), unless the authorization is terminated  or revoked sooner.       Influenza A by PCR NEGATIVE NEGATIVE Final   Influenza B by PCR NEGATIVE NEGATIVE Final    Comment: (NOTE) The Xpert Xpress SARS-CoV-2/FLU/RSV plus assay is intended as an aid in the diagnosis of influenza from Nasopharyngeal  swab specimens and should not be used as a sole basis for treatment. Nasal washings and aspirates are unacceptable for Xpert Xpress SARS-CoV-2/FLU/RSV testing.  Fact Sheet for Patients: EntrepreneurPulse.com.au  Fact Sheet for Healthcare Providers: IncredibleEmployment.be  This test is not yet approved or cleared by the [place] FDA and has been authorized for detection and/or diagnosis of SARS-CoV-2 by FDA under an Emergency Use Authorization (EUA). This EUA will remain in effect (meaning this test can be used) for the duration of the COVID-19 declaration under Section 564(b)(1) of the Act, 21 U.S.C. section 360bbb-3(b)(1), unless the authorization is terminated or revoked.  Performed at [facility], [address] [place], [place] [address]   MRSA PCR Screening     Status: None   Collection Time: 07/29/20  1:42 PM   Specimen: Nasal Mucosa; Nasopharyngeal  Result Value Ref Range Status   MRSA by PCR NEGATIVE NEGATIVE Final    Comment:        The GeneXpert MRSA Assay (FDA approved for NASAL specimens only), is one component of a comprehensive MRSA colonization surveillance program. It is not intended to diagnose MRSA infection nor to guide or monitor treatment for MRSA infections. Performed at [facility], [address] [place], [place] [address]      Radiology Studies: No results found.   Scheduled Meds: . Chlorhexidine Gluconate Cloth  6 each Topical Daily  . doxycycline  100 mg Oral Q12H  . folic acid  1 mg Oral Daily  . gabapentin  300 mg Oral BID  . heparin injection (subcutaneous)  5,000 Units Subcutaneous Q8H  . multivitamin with minerals  1 tablet Oral Daily  . nicotine  21 mg Transdermal Daily  . pantoprazole  40 mg Oral Daily  . thiamine  100 mg Oral Daily   Or  . thiamine  100 mg Intravenous Daily   Continuous Infusions: . sodium chloride 100 mL/hr at 07/31/20 0006     LOS: 3 days    Time spent: 30  minutes  Barton Dubois, MD [facility]   To contact the attending provider between 7A-7P or the covering provider during after hours 7P-7A, please log into the web site [email or web address] and access using universal  password for that web site. If you do not have the password, please call the hospital operator.  08/01/2020, 4:44 PM

## 2020-08-02 ENCOUNTER — Other Ambulatory Visit (HOSPITAL_COMMUNITY): Payer: Self-pay | Admitting: Internal Medicine

## 2020-08-02 DIAGNOSIS — G9340 Encephalopathy, unspecified: Secondary | ICD-10-CM

## 2020-08-02 DIAGNOSIS — Z720 Tobacco use: Secondary | ICD-10-CM

## 2020-08-02 DIAGNOSIS — E876 Hypokalemia: Secondary | ICD-10-CM

## 2020-08-02 DIAGNOSIS — F1010 Alcohol abuse, uncomplicated: Secondary | ICD-10-CM

## 2020-08-02 LAB — BASIC METABOLIC PANEL
Anion gap: 9 (ref 5–15)
BUN: 11 mg/dL (ref 6–20)
CO2: 22 mmol/L (ref 22–32)
Calcium: 8.7 mg/dL — ABNORMAL LOW (ref 8.9–10.3)
Chloride: 104 mmol/L (ref 98–111)
Creatinine, Ser: 0.7 mg/dL (ref 0.61–1.24)
GFR, Estimated: >60 mL/min (ref >60)
Glucose, Bld: 111 mg/dL — ABNORMAL HIGH (ref 70–99)
Potassium: 4.3 mmol/L (ref 3.5–5.1)
Sodium: 135 mmol/L (ref 135–145)

## 2020-08-02 MED ORDER — POTASSIUM CHLORIDE CRYS ER 20 MEQ PO TBCR: 20.0000 meq | EXTENDED_RELEASE_TABLET | Freq: Every day | ORAL | 1 refills | Status: DC

## 2020-08-02 MED ORDER — TEMAZEPAM 15 MG PO CAPS: 15.0000 mg | ORAL_CAPSULE | Freq: Every evening | ORAL | 0 refills | Status: DC | PRN

## 2020-08-02 MED ORDER — SERTRALINE HCL 25 MG PO TABS: 25.0000 mg | ORAL_TABLET | Freq: Every day | ORAL | 2 refills | Status: DC

## 2020-08-02 MED ORDER — SERTRALINE HCL 50 MG PO TABS
25.0000 mg | ORAL_TABLET | Freq: Every day | ORAL | Status: DC
  Administered 2020-08-02: 25 mg via ORAL
  Filled 2020-08-02: qty 1

## 2020-08-02 MED ORDER — GABAPENTIN 300 MG PO CAPS: 300.0000 mg | ORAL_CAPSULE | Freq: Two times a day (BID) | ORAL | 1 refills | Status: DC

## 2020-08-02 MED ORDER — DOXYCYCLINE HYCLATE 100 MG PO TABS: 100.0000 mg | ORAL_TABLET | Freq: Two times a day (BID) | ORAL | 0 refills | Status: DC

## 2020-08-02 MED ORDER — ALBUTEROL SULFATE HFA 108 (90 BASE) MCG/ACT IN AERS: 2.0000 | INHALATION_SPRAY | Freq: Four times a day (QID) | RESPIRATORY_TRACT | 2 refills | Status: DC | PRN

## 2020-08-02 MED ORDER — NICOTINE 21 MG/24HR TD PT24: 21.0000 mg | MEDICATED_PATCH | Freq: Every day | TRANSDERMAL | 3 refills | Status: DC

## 2020-08-02 MED ORDER — TEMAZEPAM 15 MG PO CAPS: 15.0000 mg | ORAL_CAPSULE | Freq: Every evening | ORAL | Status: DC | PRN

## 2020-08-02 MED ORDER — ADULT MULTIVITAMIN W/MINERALS CH: 1.0000 | ORAL_TABLET | Freq: Every day | ORAL | 3 refills | Status: DC

## 2020-08-02 NOTE — Progress Notes (Signed)
Discharge instructions reviewed with patient follow up visits reviewed with patient. Patient verbalized understanding of instructions.  Patient discharged home with family in stable condition.

## 2020-08-02 NOTE — TOC Progression Note (Signed)
Transition of Care [facility]) - Progression Note    Patient Details  Name: Brendan Ochoa MRN: [id number] Date of Birth: [birth year]  Transition of Care [facility]) CM/SW Contact  Shade Flood, LCSW Phone Number: 08/02/2020, 3:23 PM  Clinical Narrative:      Pt cleared by TTS. RN working on documentation for IVC discontinuation. Met with pt to provide substance use treatment resources. Pt verbalized gratitude and stated he would follow up. No other TOC needs at this time.      Expected Discharge Plan and Services                                                 Social Determinants of Health (SDOH) Interventions    Readmission Risk Interventions No flowsheet data found.

## 2020-08-02 NOTE — Progress Notes (Signed)
Patient information has been sent to [facility] via secure chat to review for potential admission. Patient meets inpatient criteria per Ileene Musa, PA .   Situation ongoing, CSW will continue to monitor progress.    Signed:  Durenda Hurt, MSW, [place], LCASA 08/02/2020 11:06 AM

## 2020-08-02 NOTE — Progress Notes (Addendum)
PROGRESS NOTE    Brendan Ochoa  NKN:[id number] DOB: 1975-06-24 DOA: 07/28/2020 PCP: Ladell Pier, MD    Chief Complaint  Patient presents with  . Suicidal    Brief Narrative:  Brendan Ochoa is a 45 y.o. male with medical history significant of with past medical history significant of polysubstance use, history of Crohn's, asthma, gastroesophageal flux disease and prior hx of alcohol abuse; who presented to the emergency department secondary to not feeling good and having vague suicidal thoughts.  Patient reports using cocaine: Heroine, marijuana and amphetamine recently in binge manners after breaking up with his girlfriend. Patient denied to EDP CP, SOB, nausea, vomiting, dysuria, focal weakness or overt bleeding.   Patient symptoms deteriorated, behavior became combative and very agitated. Not following commands and essentially in danger to himself and staff. Restrains applied, geodon and benzodiazepine given.   Patient is hemodynamically stable, no following commands and at the time of my examination very obtunded and sleeping.  Protecting airways.  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 -Currently patient is not agitated or combative; following commands and denied suicidal thoughts. -Anxious and restless on examination -Reports ongoing binge of polysubstance use since after breaking up with his girlfriend for approximately 7 days straight prior to admission. -Continue as needed benzodiazepines, CIWA monitoring and as needed Haldol. -Following recommendations by psychiatry service patient has been started Neurontin and if needed will also add the use of Benadryl; no significant agitation at this moment and trying to prevent oversedation. -From internal medicine standpoint patient has been entirely stabilized and is ready to be transferred to inpatient psychiatry facility when bed available for further management.  2-history of  asthma -No wheezing, no shortness of breath complaints -Continue as needed bronchodilators.  3-gastroesophageal for disease -Continue PPI.  4-right upper extremity cellulitis -Apply cold compresses, keep leg elevated -Short course of oral doxycycline initiated (planning for a total of 7 days course). -ibuprofen has been ordered to assist with pain and swelling. -Improving and patient is expressing no discomfort currently.  5-mild rhabdomyolysis -Nontraumatic -Continue IV fluids and maintain adequate hydration.  6-hypokalemia -Continue repletion as needed and use daily maintenance. -Will follow electrolytes trend.   DVT prophylaxis: Heparin Code Status: Full code Family Communication: No family at bedside. Disposition:   Status is: Inpatient  Dispo: The patient is from: Home              Anticipated d/c is to: To be determined              Patient currently medically stable and overall improving in his psychotic presentation.  From internal medicine standpoint he is medically clear for discharge to psychiatry facility, for inpatient treatment as recommended by psychiatry service.   Difficult to place patient no     Consultants:   Psychiatry service   Procedures:  See below for x-ray reports.   Antimicrobials:  Doxycycline   Subjective: No chest pain, no fever, no nausea, no vomiting, patient reports no dysuria or any other complaints.  Objective: Vitals:   08/01/20 1405 08/01/20 2055 08/02/20 0100 08/02/20 0632  BP: 130/76 128/77  118/86  Pulse: 66 72 75 62  Resp: 17 16  20   Temp: 98.6 F (37 C) 97.9 F (36.6 C)  97.7 F (36.5 C)  TempSrc: Oral   Temporal  SpO2: 100% 100%  100%  Weight:      Height:        Intake/Output Summary (Last 24 hours)  at 08/02/2020 1222 Last data filed at 08/02/2020 0100 Gross per 24 hour  Intake 1080 ml  Output --  Net 1080 ml   Filed Weights   07/28/20 1952 07/29/20 1344  Weight: 70.6 kg 66.3 kg     Examination: General exam: Alert, awake, oriented x 3; answering questions appropriately.  Patient reports no suicidal ideation at this time.  Anxious behavior noticed. Respiratory system: Clear to auscultation. Respiratory effort normal. Cardiovascular system:RRR. No murmurs, rubs, gallops. Gastrointestinal system: Abdomen is nondistended, soft and nontender. No organomegaly or masses felt. Normal bowel sounds heard. Central nervous system: No focal neurological deficits. Extremities: No cyanosis or clubbing; right upper extremity with essentially complete resolution of erythema and swelling from acute cellulitis. Skin: No petechiae. Psychiatry: Following commands appropriately.  Anxious behavior.  Suicidal ideation.   Data Reviewed: I have personally reviewed following labs and imaging studies  CBC: Recent Labs  Lab 07/28/20 2042 07/30/20 0351  WBC 8.4 6.3  NEUTROABS 5.7  --   HGB 15.2 13.8  HCT 44.6 41.1  MCV 88.7 90.9  PLT 339 696    Basic Metabolic Panel: Recent Labs  Lab 07/28/20 2042 07/29/20 0634 07/29/20 1849 07/30/20 0351 07/31/20 0446 08/02/20 0600  NA 134* 137  --  137 134* 135  K 3.7 3.7  --  3.4* 3.1* 4.3  CL 102 103  --  107 104 104  CO2 19* 21*  --  21* 23 22  GLUCOSE 103* 135*  --  98 135* 111*  BUN 9 15  --  15 9 11   CREATININE 0.81 0.96  --  0.72 0.63 0.70  CALCIUM 9.3 9.8  --  8.4* 8.1* 8.7*  MG  --   --  2.1  --  1.9  --   PHOS  --   --  4.5  --  2.7  --    GFR: Estimated Creatinine Clearance: 112.8 mL/min (by C-G formula based on SCr of 0.7 mg/dL).  Liver Function Tests: Recent Labs  Lab 07/28/20 2042 07/31/20 0446  AST 24 30  ALT 18 19  ALKPHOS 68 49  BILITOT 1.1 0.7  PROT 7.6 5.6*  ALBUMIN 4.3 3.1*    CBG: No results for input(s): GLUCAP in the last 168 hours.  Recent Results (from the past 240 hour(s))  Resp Panel by RT-PCR (Flu A&B, Covid) Nasopharyngeal Swab     Status: None   Collection Time: 07/28/20  9:46 PM    Specimen: Nasopharyngeal Swab; Nasopharyngeal(NP) swabs in vial transport medium  Result Value Ref Range Status   SARS Coronavirus 2 by RT PCR NEGATIVE NEGATIVE Final    Comment: (NOTE) SARS-CoV-2 target nucleic acids are NOT DETECTED.  The SARS-CoV-2 RNA is generally detectable in upper respiratory specimens during the acute phase of infection. The lowest concentration of SARS-CoV-2 viral copies this assay can detect is 138 copies/mL. A negative result does not preclude SARS-Cov-2 infection and should not be used as the sole basis for treatment or other patient management decisions. A negative result may occur with  improper specimen collection/handling, submission of specimen other than nasopharyngeal swab, presence of viral mutation(s) within the areas targeted by this assay, and inadequate number of viral copies(<138 copies/mL). A negative result must be combined with clinical observations, patient history, and epidemiological information. The expected result is Negative.  Fact Sheet for Patients:  EntrepreneurPulse.com.au  Fact Sheet for Healthcare Providers:  IncredibleEmployment.be  This test is no t yet approved or cleared by the [place] FDA  and  has been authorized for detection and/or diagnosis of SARS-CoV-2 by FDA under an Emergency Use Authorization (EUA). This EUA will remain  in effect (meaning this test can be used) for the duration of the COVID-19 declaration under Section 564(b)(1) of the Act, 21 U.S.C.section 360bbb-3(b)(1), unless the authorization is terminated  or revoked sooner.       Influenza A by PCR NEGATIVE NEGATIVE Final   Influenza B by PCR NEGATIVE NEGATIVE Final    Comment: (NOTE) The Xpert Xpress SARS-CoV-2/FLU/RSV plus assay is intended as an aid in the diagnosis of influenza from Nasopharyngeal swab specimens and should not be used as a sole basis for treatment. Nasal washings and aspirates are  unacceptable for Xpert Xpress SARS-CoV-2/FLU/RSV testing.  Fact Sheet for Patients: EntrepreneurPulse.com.au  Fact Sheet for Healthcare Providers: IncredibleEmployment.be  This test is not yet approved or cleared by the [place] FDA and has been authorized for detection and/or diagnosis of SARS-CoV-2 by FDA under an Emergency Use Authorization (EUA). This EUA will remain in effect (meaning this test can be used) for the duration of the COVID-19 declaration under Section 564(b)(1) of the Act, 21 U.S.C. section 360bbb-3(b)(1), unless the authorization is terminated or revoked.  Performed at [facility], [address], [place], [place] [address]   MRSA PCR Screening     Status: None   Collection Time: 07/29/20  1:42 PM   Specimen: Nasal Mucosa; Nasopharyngeal  Result Value Ref Range Status   MRSA by PCR NEGATIVE NEGATIVE Final    Comment:        The GeneXpert MRSA Assay (FDA approved for NASAL specimens only), is one component of a comprehensive MRSA colonization surveillance program. It is not intended to diagnose MRSA infection nor to guide or monitor treatment for MRSA infections. Performed at [facility], [address], [place],  [address]      Radiology Studies: No results found.   Scheduled Meds: . Chlorhexidine Gluconate Cloth  6 each Topical Daily  . doxycycline  100 mg Oral Q12H  . folic acid  1 mg Oral Daily  . gabapentin  300 mg Oral BID  . heparin injection (subcutaneous)  5,000 Units Subcutaneous Q8H  . multivitamin with minerals  1 tablet Oral Daily  . nicotine  21 mg Transdermal Daily  . pantoprazole  40 mg Oral Daily  . potassium chloride  40 mEq Oral Daily  . sertraline  25 mg Oral Daily  . thiamine  100 mg Oral Daily   Or  . thiamine  100 mg Intravenous Daily   Continuous Infusions: . sodium chloride 50 mL/hr at 08/01/20 1743     LOS: 4 days    Time spent: 30 minutes  Barton Dubois,  MD [facility]   To contact the attending provider between 7A-7P or the covering provider during after hours 7P-7A, please log into the web site [email or web address] and access using universal [place] password for that web site. If you do not have the password, please call the hospital operator.  08/02/2020, 12:22 PM

## 2020-08-02 NOTE — Discharge Summary (Signed)
Physician Discharge Summary  Brendan Ochoa ALP:[id number] DOB: 07-03-1975 DOA: 07/28/2020  PCP: Ladell Pier, MD  Admit date: 07/28/2020 Discharge date: 08/02/2020  Time spent: 35 minutes  Recommendations for Outpatient Follow-up:  Continue to monitor mental electrolytes and renal function Continue assisting patient with polysubstance abuse cessation Reassess right upper extremity cellulitic process to assure complete resolution after completing antibiotic therapy.  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  Discharged home with instruction to follow-up with PCP and psychiatry as an outpatient.  CODE STATUS: Full code.  Diet recommendation: Regular diet  Filed Weights   07/28/20 1952 07/29/20 1344  Weight: 70.6 kg 66.3 kg    History of present illness:  Brendan Aydelott Kightlingeris a 45 y.o.malewith medical history significant ofwith past medical history significant of polysubstance use, history of Crohn's, asthma, gastroesophageal flux diseaseand prior hx of alcohol abuse;who presented to the emergency department secondary to not feeling good and having vague suicidal thoughts. Patient reports using cocaine: Heroine, marijuana and amphetamine recently in binge manners after breaking up with his girlfriend. Patient denied to EDP CP, SOB, nausea, vomiting, dysuria, focal weakness or overt bleeding.   Patient symptoms deteriorated, behavior became combative and very agitated. Not following commands and essentially in danger to himself and staff. Restrains applied, geodon and benzodiazepine given.  Patient is hemodynamically stable, no following commands and at the time of my examination very obtunded and sleeping. Protecting airways.  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 -Currently patient is not agitated or combative; following commands and deniying suicidal thoughts. -Anxious and restless on examination; wanting to go home. -Reported ongoing binge of polysubstance use since after breaking up with his girlfriend for approximately 7 days straight prior to admission. -CIWA score 6; denies visual disturbances and hallucinations. -Following recommendations by psychiatry service patient has been started on Neurontin and zoloft. -he has been clear for outpatient psychiatry and detox therapy.   2-history of asthma -No wheezing, no shortness of breath complaints -Continue as needed bronchodilators.  3-gastroesophageal for disease -Continue PPI.  4-right upper extremity cellulitis -Apply cold compresses, keep leg elevated -Short course of oral doxycycline initiated (planning for a total of 7 days course). -Improved swelling/erythema (essentially resolved) and patient is expressing no discomfort currently.  5-mild rhabdomyolysis -Nontraumatic -Continue to maintain adequate hydration.  6-hypokalemia -Continue repletion as needed and use daily maintenance. -Will follow electrolytes trend.  7-tobacco abuse -Cessation counseling provided -Nicotine patch ordered.  Procedures:  See below for x-ray reports.  Consultations:  Psychiatry service  Discharge Exam: Vitals:   08/02/20 0632 08/02/20 1433  BP: 118/86 120/78  Pulse: 62 66  Resp: 20 19  Temp: 97.7 F (36.5 C) 97.6 F (36.4 C)  SpO2: 100% 100%   General exam: Alert, awake, oriented x 3; answering questions appropriately.  Patient reports no suicidal ideation at this time.  Anxious behavior noticed. Respiratory system: Clear to auscultation. Respiratory effort normal. Cardiovascular system:RRR. No murmurs, rubs, gallops. Gastrointestinal system: Abdomen is nondistended, soft and nontender. No organomegaly or masses felt. Normal bowel  sounds heard. Central nervous system: No focal neurological deficits. Extremities: No cyanosis or clubbing; right upper extremity with essentially complete resolution of erythema and swelling from acute cellulitis. Skin: No petechiae. Psychiatry: Following commands appropriately.  Anxious behavior.  Suicidal ideation.    Discharge Instructions   Discharge Instructions    Diet - low sodium heart healthy   Complete by: As directed    Discharge instructions   Complete by: As directed    Stop the use of recreational drugs, alcohol and tobacco abuse. Take medications as prescribed Maintain adequate hydration Follow-up with psychiatry service as instructed Follow-up with PCP in 1 week   No wound care   Complete by: As directed      Allergies as of 08/02/2020   No Known Allergies     Medication List    STOP taking these medications   nicotine 14 mg/24hr patch Commonly known as: NICODERM CQ - dosed in mg/24 hours Replaced by: nicotine 21 mg/24hr patch   nicotine polacrilex 2 MG gum Commonly known as: NICORETTE     TAKE these medications   albuterol 108 (90 Base) MCG/ACT inhaler Commonly known as: VENTOLIN HFA Inhale 2 puffs into the lungs every 6 (six) hours as needed for wheezing or shortness of breath.   doxycycline 100 MG tablet Commonly known as: VIBRA-TABS Take 1 tablet (100 mg total) by mouth every 12 (twelve) hours for 4 days.   gabapentin 300 MG capsule Commonly known as: NEURONTIN Take 1 capsule (300 mg total) by mouth 2 (two) times daily.   multivitamin with minerals Tabs tablet Take 1 tablet by mouth daily. Start taking on: August 03, 2020   nicotine 21 mg/24hr patch Commonly known as: NICODERM CQ - dosed in mg/24 hours Place 1 patch (21 mg total) onto the skin daily. Start taking on: August 03, 2020 Replaces: nicotine 14 mg/24hr patch   potassium chloride SA 20 MEQ tablet Commonly known as: KLOR-CON Take 1 tablet (20 mEq total) by mouth daily. Start  taking on: August 03, 2020   sertraline 25 MG tablet Commonly known as: ZOLOFT Take 1 tablet (25 mg total) by mouth daily. Start taking on: August 03, 2020   temazepam 15 MG capsule Commonly known as: RESTORIL Take 1 capsule (15 mg total) by mouth at bedtime as needed for sleep.      No Known Allergies  Follow-up Information    Services, [facility]. Schedule an appointment as soon as possible for a visit.   Why: Please call to make appointment for substance use outpatient services.  Contact information: [place] [address] [phone]        Ladell Pier, MD. Schedule an appointment as soon as possible for a visit in 1 week(s).   Specialty: Internal Medicine Contact information: [place] [address] [phone]               The results of significant diagnostics from this hospitalization (including imaging, microbiology, ancillary and laboratory) are listed below for reference.    Significant Diagnostic Studies: No results found.  Microbiology: Recent Results (from the past 240 hour(s))  Resp Panel by RT-PCR (Flu A&B, Covid) Nasopharyngeal Swab     Status: None   Collection Time: 07/28/20  9:46 PM   Specimen: Nasopharyngeal Swab; Nasopharyngeal(NP) swabs in vial transport medium  Result Value Ref Range Status   SARS Coronavirus 2 by RT PCR NEGATIVE NEGATIVE Final    Comment: (NOTE) SARS-CoV-2 target nucleic acids are NOT DETECTED.  The SARS-CoV-2 RNA is generally detectable in upper respiratory specimens during the acute phase of infection. The lowest concentration of SARS-CoV-2 viral copies this assay can detect is 138 copies/mL. A negative result does not preclude  SARS-Cov-2 infection and should not be used as the sole basis for treatment or other patient management decisions. A negative result may occur with  improper specimen collection/handling, submission of specimen other than nasopharyngeal swab,  presence of viral mutation(s) within the areas targeted by this assay, and inadequate number of viral copies(<138 copies/mL). A negative result must be combined with clinical observations, patient history, and epidemiological information. The expected result is Negative.  Fact Sheet for Patients:  EntrepreneurPulse.com.au  Fact Sheet for Healthcare Providers:  IncredibleEmployment.be  This test is no t yet approved or cleared by the [place] FDA and  has been authorized for detection and/or diagnosis of SARS-CoV-2 by FDA under an Emergency Use Authorization (EUA). This EUA will remain  in effect (meaning this test can be used) for the duration of the COVID-19 declaration under Section 564(b)(1) of the Act, 21 U.S.C.section 360bbb-3(b)(1), unless the authorization is terminated  or revoked sooner.       Influenza A by PCR NEGATIVE NEGATIVE Final   Influenza B by PCR NEGATIVE NEGATIVE Final    Comment: (NOTE) The Xpert Xpress SARS-CoV-2/FLU/RSV plus assay is intended as an aid in the diagnosis of influenza from Nasopharyngeal swab specimens and should not be used as a sole basis for treatment. Nasal washings and aspirates are unacceptable for Xpert Xpress SARS-CoV-2/FLU/RSV testing.  Fact Sheet for Patients: EntrepreneurPulse.com.au  Fact Sheet for Healthcare Providers: IncredibleEmployment.be  This test is not yet approved or cleared by the [place] FDA and has been authorized for detection and/or diagnosis of SARS-CoV-2 by FDA under an Emergency Use Authorization (EUA). This EUA will remain in effect (meaning this test can be used) for the duration of the COVID-19 declaration under Section 564(b)(1) of the Act, 21 U.S.C. section 360bbb-3(b)(1), unless the authorization is terminated or revoked.  Performed at [facility], [address], [place], [place] [address]   MRSA PCR Screening      Status: None   Collection Time: 07/29/20  1:42 PM   Specimen: Nasal Mucosa; Nasopharyngeal  Result Value Ref Range Status   MRSA by PCR NEGATIVE NEGATIVE Final    Comment:        The GeneXpert MRSA Assay (FDA approved for NASAL specimens only), is one component of a comprehensive MRSA colonization surveillance program. It is not intended to diagnose MRSA infection nor to guide or monitor treatment for MRSA infections. Performed at [facility], [address], [place], [place] [address]      Labs: Basic Metabolic Panel: Recent Labs  Lab 07/28/20 2042 07/29/20 0634 07/29/20 1849 07/30/20 0351 07/31/20 0446 08/02/20 0600  NA 134* 137  --  137 134* 135  K 3.7 3.7  --  3.4* 3.1* 4.3  CL 102 103  --  107 104 104  CO2 19* 21*  --  21* 23 22  GLUCOSE 103* 135*  --  98 135* 111*  BUN 9 15  --  15 9 11   CREATININE 0.81 0.96  --  0.72 0.63 0.70  CALCIUM 9.3 9.8  --  8.4* 8.1* 8.7*  MG  --   --  2.1  --  1.9  --   PHOS  --   --  4.5  --  2.7  --    Liver Function Tests: Recent Labs  Lab 07/28/20 2042 07/31/20 0446  AST 24 30  ALT 18 19  ALKPHOS 68 49  BILITOT 1.1 0.7  PROT 7.6 5.6*  ALBUMIN 4.3 3.1*   CBC: Recent Labs  Lab 07/28/20 2042 07/30/20 0351  WBC 8.4 6.3  NEUTROABS 5.7  --   HGB 15.2 13.8  HCT 44.6 41.1  MCV 88.7 90.9  PLT 339 240   Cardiac Enzymes: Recent Labs  Lab 07/29/20 0634 07/31/20 0446  CKTOTAL 856* 603*    Signed:  Barton Dubois MD.  [facility] 08/02/2020, 4:43 PM

## 2020-08-02 NOTE — Discharge Instructions (Signed)
Substance abuse resources and Residential Options:  [facility] day residential substance abuse facility (not an option if you have active assault charges). [address] [place], [place] [address] Phone: [phone]: Ask for Myrlene Broker in admissions to complete intake if interested in pursuing this option.  [facility]: Can get intake scheduled; (not an option if you have active assault charges). [address] [place], [place] [phone]) Call Mon-Fri.  [facility] ([facility]): (offers outpatient therapy and intensive outpatient substance abuse therapy).  [address] [place], [place] [address] Phone: [phone]  [facility]: Offers FREE recovery skills classes, support groups, 1:1 Peer Support, and Compeer Classes. [address] [place], [place] [address] Phone: [phone] (Call to complete intake).   [facility] [place], [place] [address] Phone: [phone] ext [phone]  The [facility] provides food, shelter and other programs and services to the homeless men of [place] through our [employer] program.  By offering safe shelter, three meals a day, clean clothing, Biblical counseling, financial planning, vocational training, GED/education and employment assistance, we've helped mend the shattered lives of many homeless men since opening in 1974.  We have approximately 267 beds available, with a max of 312 beds including mats for emergency situations and currently house an average of 270 men a night.  Prospective Client Check-In Information Photo ID Required (State/ Out of State/ [facility]) - if photo ID is not available, clients are required to have a printout of a police/sheriff's criminal history report. Help out with chores around the [place]. No sex offender of any type (pending, charged, registered and/or any other sex related offenses) will be permitted to check in. Must be  willing to abide by all rules, regulations, and policies established by the [employer]. The following will be provided - shelter, food, clothing, and biblical counseling. If you or someone you know is in need of assistance at our [facility] shelter in [place], [place], please call [phone] ext. [phone].

## 2020-08-02 NOTE — Consult Note (Signed)
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 male patient.  Patient states "I had a bad break-up with my girlfriend and I went on and 1 week binge of methamphetamine, cocaine and heroin."  HPI:   Patient reassessed by nurse practitioner.  Patient alert and oriented, answers appropriately.  Patient pleasant and cooperative during assessment.  Patient reports he recently, approximately 6 months ago began using methamphetamine.  Approximately 1 week ago he began using heroin.  Patient reports he has used alcohol and cocaine for many years.  Patient reports heroin and methamphetamine affected his mood in a negative way.  He reports a plan to abstain from substance and alcohol use moving forward. Brendan Ochoa states he would like to return to [facility] for [facility], reports he has thought treatment here several times before.  Patient denies suicidal and homicidal ideations currently.  Patient denies any history of suicide attempts denies any self-harm behaviors.  He also denies auditory visual hallucinations.  There is no evidence of delusional thought content and no indication that patient is responding to internal stimuli.  He denies symptoms of paranoia.  Patient currently resides with his parents in [place].  He denies access to weapons. Brendan Ochoa is employed in [employer].  He endorses average sleep and appetite.  Brendan Ochoa reports he was diagnosed with depression many  years ago.  He reports he recently saw outpatient psychiatry at that time.  He denies any current outpatient psychiatry follow-up, denies current therapy. Jahkeem reports he is interested in following up with outpatient psychiatry moving forward.  He would like to address his substance use prior to outpatient psychiatry.  Patient offered support and encouragement.  Brendan Ochoa gives consent to speak with his father, Brendan Ochoa phone number [phone].  Patient's father denies concern for patient safety.  Brendan Ochoa does verbalize concern for patient's continued substance use. Brendan Ochoa also verbalizes concern that patient is not completely vested in substance use abstinence.  Patient's father reports he will transport patient home today.  Past Psychiatric History: Polysubstance use disorder, stimulant-induced psychotic disorder, cocaine abuse, alcohol abuse  Risk to Self:   Denies Risk to Others:   Denies Prior Inpatient Therapy:   several instances of inpatient substance use treatment Prior Outpatient Therapy:  reports therapy in the distant past  Past Medical History:  Past Medical History:  Diagnosis Date  . Asthma   . Cocaine abuse (HCC)    in past  . Crohn disease (HCC) 2008  . Depression    in past, not current as of 2019   . ETOH abuse    history of alcohol use  . GERD (gastroesophageal reflux disease)     Past Surgical History:  Procedure Laterality Date  . BIOPSY  10/15/2017   Procedure: BIOPSY;  Surgeon: West Bali, MD;  Location: [facility];  Service: Endoscopy;;  terminal ileum colon  . COLONOSCOPY WITH PROPOFOL N/A 10/15/2017   redundant colon, normal rectum, external and internal hemorrhoids, quiescent crohn's. Pathology: inactive chronicn nonspecific ileitis  . ESOPHAGOGASTRODUODENOSCOPY (EGD) WITH PROPOFOL N/A 10/15/2017  normal esophagus, small hiatal hernia, gastritis, duodenitis  . FINGER AMPUTATION Right 2012   cut finger table saw and surgically amputated   Family History:   Family History  Problem Relation Age of Onset  . Stomach cancer Maternal Grandfather   . Colon cancer Neg Hx    Family Psychiatric  History: sister and brother- history of substance use disorder Social History:  Social History   Substance and Sexual Activity  Alcohol Use Yes   Comment: occasionally     Social History   Substance and Sexual Activity  Drug Use Yes  . Types: Cocaine   Comment: last use 2 days ago    Social History   Socioeconomic History  . Marital status: Single    Spouse name: Not on file  . Number of children: Not on file  . Years of education: Not on file  . Highest education level: Not on file  Occupational History  . Not on file  Tobacco Use  . Smoking status: Current Every Day Smoker    Packs/day: 0.25    Years: 15.00    Pack years: 3.75    Types: Cigarettes  . Smokeless tobacco: Never Used  . Tobacco comment: 1/4 to 1/2 pack per day   Vaping Use  . Vaping Use: Never used  Substance and Sexual Activity  . Alcohol use: Yes    Comment: occasionally  . Drug use: Yes    Types: Cocaine    Comment: last use 2 days ago  . Sexual activity: Not on file  Other Topics Concern  . Not on file  Social History Narrative  . Not on file   Social Determinants of Health   Financial Resource Strain: Not on file  Food Insecurity: Not on file  Transportation Needs: Not on file  Physical Activity: Not on file  Stress: Not on file  Social Connections: Not on file   Additional Social History:    Allergies:  No Known Allergies  Labs:  Results for orders placed or performed during the hospital encounter of 07/28/20 (from the past 48 hour(s))  Basic metabolic panel     Status: Abnormal   Collection Time: 08/02/20  6:00 AM  Result Value Ref Range   Sodium 135 135 - 145 mmol/L   Potassium 4.3 3.5 - 5.1 mmol/L   Chloride 104 98 - 111 mmol/L   CO2 22 22 - 32 mmol/L   Glucose, Bld 111 (H) 70 - 99 mg/dL    Comment: Glucose reference range applies only  to samples taken after fasting for at least 8 hours.   BUN 11 6 - 20 mg/dL   Creatinine, Ser 9.02 0.61 - 1.24 mg/dL   Calcium 8.7 (L) 8.9 - 10.3 mg/dL   GFR, Estimated >40 >97 mL/min    Comment: (NOTE) Calculated using the CKD-EPI Creatinine Equation (2021)    Anion gap 9 5 - 15    Comment: Performed at [facility], [address] [place], [place] [address]    Medications:  Current Facility-Administered Medications  Medication Dose Route Frequency Provider Last Rate Last Admin  . 0.9 %  sodium chloride infusion   Intravenous Continuous Vassie Loll, MD 50 mL/hr at 08/01/20 1743 Rate Change at 08/01/20 1743  . acetaminophen (TYLENOL) tablet 650 mg  650 mg Oral Q4H PRN Vassie Loll, MD   650 mg at 08/01/20 0526  . albuterol (VENTOLIN HFA) 108 (90 Base) MCG/ACT inhaler 2 puff  2 puff Inhalation Q6H PRN Vassie Loll, MD      .  alum & mag hydroxide-simeth (MAALOX/MYLANTA) 200-200-20 MG/5ML suspension 30 mL  30 mL Oral Q6H PRN Vassie Loll, MD      . Chlorhexidine Gluconate Cloth 2 % PADS 6 each  6 each Topical Daily Vassie Loll, MD   6 each at 08/02/20 1209  . doxycycline (VIBRA-TABS) tablet 100 mg  100 mg Oral Q12H Vassie Loll, MD   100 mg at 08/02/20 0855  . folic acid (FOLVITE) tablet 1 mg  1 mg Oral Daily Vassie Loll, MD   1 mg at 08/02/20 0855  . gabapentin (NEURONTIN) capsule 300 mg  300 mg Oral BID Vassie Loll, MD   300 mg at 08/02/20 0854  . haloperidol lactate (HALDOL) injection 5 mg  5 mg Intravenous Q6H PRN Vassie Loll, MD   5 mg at 07/30/20 2220  . heparin injection 5,000 Units  5,000 Units Subcutaneous Q8H Vassie Loll, MD   5,000 Units at 08/02/20 0518  . multivitamin with minerals tablet 1 tablet  1 tablet Oral Daily Vassie Loll, MD   1 tablet at 08/02/20 0855  . nicotine (NICODERM CQ - dosed in mg/24 hours) patch 21 mg  21 mg Transdermal Daily Vassie Loll, MD   21 mg at 08/02/20 0858  . ondansetron (ZOFRAN) tablet 4 mg  4 mg Oral Q6H PRN Vassie Loll, MD       Or  . ondansetron [facility]) injection 4 mg  4 mg Intravenous Q6H PRN Vassie Loll, MD      . pantoprazole (PROTONIX) EC tablet 40 mg  40 mg Oral Daily Vassie Loll, MD   40 mg at 08/02/20 0855  . potassium chloride SA (KLOR-CON) CR tablet 40 mEq  40 mEq Oral Daily Vassie Loll, MD   40 mEq at 08/02/20 0856  . sertraline (ZOLOFT) tablet 25 mg  25 mg Oral Daily Nwoko, Uchenna E, PA   25 mg at 08/02/20 0855  . temazepam (RESTORIL) capsule 15 mg  15 mg Oral QHS PRN Vassie Loll, MD      . thiamine tablet 100 mg  100 mg Oral Daily Vassie Loll, MD   100 mg at 08/02/20 0263   Or  . thiamine (B-1) injection 100 mg  100 mg Intravenous Daily Vassie Loll, MD        Musculoskeletal: Strength & Muscle Tone: within normal limits Gait & Station: normal Patient leans: N/A  Psychiatric Specialty Exam: Physical Exam Vitals and nursing note reviewed.  Constitutional:      Appearance: He is well-developed.  HENT:     Head: Normocephalic.  Cardiovascular:     Rate and Rhythm: Normal rate.  Pulmonary:     Effort: Pulmonary effort is normal.  Neurological:     Mental Status: He is alert and oriented to person, place, and time.  Psychiatric:        Attention and Perception: Attention and perception normal.        Mood and Affect: Mood and affect normal.        Speech: Speech normal.        Behavior: Behavior normal. Behavior is cooperative.        Thought Content: Thought content normal.        Cognition and Memory: Cognition and memory normal.        Judgment: Judgment normal.     Review of Systems  Constitutional: Negative.   HENT: Negative.   Eyes: Negative.   Respiratory: Negative.   Cardiovascular: Negative.   Gastrointestinal: Negative.   Genitourinary: Negative.  Musculoskeletal: Negative.   Skin: Negative.   Neurological: Negative.   Psychiatric/Behavioral: Negative.     Blood pressure 118/86, pulse 62, temperature 97.7 F (36.5 C), temperature  source Temporal, resp. rate 20, height 5\' 11"  (1.803 m), weight 66.3 kg, SpO2 100 %.Body mass index is 20.39 kg/m.  General Appearance: Casual and Fairly Groomed  Eye Contact:  Good  Speech:  Clear and Coherent and Normal Rate  Volume:  Normal  Mood:  Euthymic  Affect:  Appropriate and Congruent  Thought Process:  Coherent, Goal Directed and Descriptions of Associations: Intact  Orientation:  Full (Time, Place, and Person)  Thought Content:  WDL and Logical  Suicidal Thoughts:  No  Homicidal Thoughts:  No  Memory:  Immediate;   Good Recent;   Good Remote;   Good  Judgement:  Good  Insight:  Good  Psychomotor Activity:  Normal  Concentration:  Concentration: Good and Attention Span: Good  Recall:  Good  Fund of Knowledge:  Good  Language:  Good  Akathisia:  No  Handed:  Right  AIMS (if indicated):     Assets:  Communication Skills Desire for Improvement Financial Resources/Insurance Housing Intimacy Leisure Time Physical Health Resilience Social Support Talents/Skills Transportation  ADL's:  Intact  Cognition:  WNL  Sleep:        Treatment Plan Summary: Patient reviewed with Dr .  Patient cleared by psychiatry. Follow-up with outpatient psychiatry. Follow-up with substance use treatment options, resources provided.   Disposition: No evidence of imminent risk to self or others at present.   Patient does not meet criteria for psychiatric inpatient admission. Supportive therapy provided about ongoing stressors. Discussed crisis plan, support from social network, calling 911, coming to the Emergency Department, and calling Suicide Hotline.  This service was provided via telemedicine using a 2-way, interactive audio and video technology.  Names of all persons participating in this telemedicine service and their role in this encounter. Name: Lucianne Muss Role: Patient  Name: Elvis Coil telephone Role: Patient's father  Name: Randolm Idol Role: FNP   Name: Dr Berneice Heinrich Role: Psychiatry    Lucianne Muss, FNP 08/02/2020 2:14 PM

## 2020-08-02 NOTE — B[facility] Counselor (Addendum)
TTS Probation officer completed reassessment on patient. Patient was observed in scrubs , eating lunch on hospital bed. Patient denied SI/ HI/ AVH . Stated he has detoxed since being in the hospital and wishes to be admitted to inpatient substance abuse program [place], [place]). Patient stated that he went on a binge for 7 days after girlfriend broke up with him . Patient reports using Meth, Cocaine, Heroin , ICE and drinking Alcohol. Patient reports he was angry and hurt about the break up and made some self harm statements. Patient reports he does not want to die and has a great support network of family and friends to support him. Patient provided his next of kin , his mother and gave permission to speak with her Jackalyn Lombard [phone] or [phone]). Writer staffed with Letitia Libra , NP who will assess the patient. Tawny Asal, NP patient is psych cleared.

## 2020-08-03 ENCOUNTER — Telehealth: Payer: Self-pay

## 2020-08-03 NOTE — Telephone Encounter (Signed)
Transition Care Management Unsuccessful Follow-up Telephone Call  Date of discharge and from where:  08/02/2020, [facility]   Attempts:  1st Attempt  Reason for unsuccessful TCM follow-up call:  Voice mail full # [phone].   Need to discuss scheduling a follow up appointment with Dr Wynetta Emery

## 2020-08-04 ENCOUNTER — Telehealth: Payer: Self-pay

## 2020-08-04 NOTE — Telephone Encounter (Signed)
Transition Care Management Unsuccessful Follow-up Telephone Call  Date of discharge and from where:  08/02/2020, [facility]   Attempts:  2nd Attempt  Reason for unsuccessful TCM follow-up call:  Left voice message on  3 [phone].  Call placed to # [phone], message left with call back requested to this CM.  Need to discuss scheduling a follow up appointment with PCP.

## 2020-08-05 ENCOUNTER — Telehealth: Payer: Self-pay

## 2020-08-05 NOTE — Telephone Encounter (Signed)
Transition Care Management Unsuccessful Follow-up Telephone Call  Date of discharge and from where:  08/02/2020, [facility]   Attempts:  3nd Attempt  Reason for unsuccessful TCM follow-up call:  Left voice message on  3 [phone].  Call placed at  [phone], message left with call back.  Need to discuss scheduling a follow up appointment with PCP for HFU.

## 2020-08-08 ENCOUNTER — Telehealth: Payer: Self-pay

## 2020-08-08 NOTE — Telephone Encounter (Signed)
Letter sent to patient requesting he contact [facility] to schedule hospital follow up appointment

## 2020-08-10 MED FILL — SERTRALINE HCL 25 MG TABLET: 25 | 30 days supply | Qty: 30 | Fill #0

## 2020-08-10 MED FILL — DOXYCYCLINE HYCLATE 100 MG: 100 | 4 days supply | Qty: 8 | Fill #0

## 2020-08-10 MED FILL — GABAPENTIN 300 MG CAPSULE: 300 | 30 days supply | Qty: 60 | Fill #0

## 2020-09-12 ENCOUNTER — Ambulatory Visit: Payer: Self-pay | Admitting: Internal Medicine

## 2020-09-26 ENCOUNTER — Emergency Department (HOSPITAL_COMMUNITY): Payer: Self-pay

## 2020-09-26 ENCOUNTER — Other Ambulatory Visit: Payer: Self-pay

## 2020-09-26 ENCOUNTER — Emergency Department (HOSPITAL_COMMUNITY)
Admission: EM | Admit: 2020-09-26 | Discharge: 2020-09-26 | Disposition: A | Discharge status: Home / Self Care | Payer: Self-pay | Attending: Emergency Medicine | Admitting: Emergency Medicine

## 2020-09-26 DIAGNOSIS — T1490XA Injury, unspecified, initial encounter: Secondary | ICD-10-CM

## 2020-09-26 DIAGNOSIS — S0181XA Laceration without foreign body of other part of head, initial encounter: Secondary | ICD-10-CM

## 2020-09-26 DIAGNOSIS — S01411A Laceration without foreign body of right cheek and temporomandibular area, initial encounter: Secondary | ICD-10-CM | POA: Insufficient documentation

## 2020-09-26 DIAGNOSIS — Y906 Blood alcohol level of 120-199 mg/100 ml: Secondary | ICD-10-CM | POA: Insufficient documentation

## 2020-09-26 DIAGNOSIS — R4182 Altered mental status, unspecified: Secondary | ICD-10-CM | POA: Insufficient documentation

## 2020-09-26 DIAGNOSIS — F10929 Alcohol use, unspecified with intoxication, unspecified: Secondary | ICD-10-CM | POA: Insufficient documentation

## 2020-09-26 DIAGNOSIS — S01112A Laceration without foreign body of left eyelid and periocular area, initial encounter: Secondary | ICD-10-CM | POA: Insufficient documentation

## 2020-09-26 DIAGNOSIS — Y92410 Unspecified street and highway as the place of occurrence of the external cause: Secondary | ICD-10-CM | POA: Insufficient documentation

## 2020-09-26 LAB — I-STAT CHEM 8, ED
BUN: 11 mg/dL (ref 6–20)
Calcium, Ion: 1.24 mmol/L (ref 1.15–1.40)
Chloride: 102 mmol/L (ref 98–111)
Creatinine, Ser: 0.9 mg/dL (ref 0.61–1.24)
Glucose, Bld: 91 mg/dL (ref 70–99)
HCT: 42 % (ref 39.0–52.0)
Hemoglobin: 14.3 g/dL (ref 13.0–17.0)
Potassium: 3.6 mmol/L (ref 3.5–5.1)
Sodium: 139 mmol/L (ref 135–145)
TCO2: 23 mmol/L (ref 22–32)

## 2020-09-26 LAB — COMPREHENSIVE METABOLIC PANEL WITH GFR
ALT: 15 U/L (ref 0–44)
AST: 21 U/L (ref 15–41)
Albumin: 3.8 g/dL (ref 3.5–5.0)
Alkaline Phosphatase: 66 U/L (ref 38–126)
Anion gap: 10 (ref 5–15)
BUN: 11 mg/dL (ref 6–20)
CO2: 26 mmol/L (ref 22–32)
Calcium: 10.1 mg/dL (ref 8.9–10.3)
Chloride: 102 mmol/L (ref 98–111)
Creatinine, Ser: 0.92 mg/dL (ref 0.61–1.24)
GFR, Estimated: 56 mL/min — ABNORMAL LOW
Glucose, Bld: 96 mg/dL (ref 70–99)
Potassium: 3.6 mmol/L (ref 3.5–5.1)
Sodium: 138 mmol/L (ref 135–145)
Total Bilirubin: 0.9 mg/dL (ref 0.3–1.2)
Total Protein: 6.7 g/dL (ref 6.5–8.1)

## 2020-09-26 LAB — CBC
HCT: 43 % (ref 39.0–52.0)
Hemoglobin: 14.4 g/dL (ref 13.0–17.0)
MCH: 30.4 pg (ref 26.0–34.0)
MCHC: 33.5 g/dL (ref 30.0–36.0)
MCV: 90.9 fL (ref 80.0–100.0)
Platelets: 269 K/uL (ref 150–400)
RBC: 4.73 MIL/uL (ref 4.22–5.81)
RDW: 12.7 % (ref 11.5–15.5)
WBC: 9.2 K/uL (ref 4.0–10.5)
nRBC: 0 % (ref 0.0–0.2)

## 2020-09-26 LAB — PROTIME-INR
INR: 0.9 (ref 0.8–1.2)
Prothrombin Time: 12 s (ref 11.4–15.2)

## 2020-09-26 LAB — ETHANOL: Alcohol, Ethyl (B): 131 mg/dL — ABNORMAL HIGH (ref <10)

## 2020-09-26 LAB — SAMPLE TO BLOOD BANK

## 2020-09-26 LAB — LACTIC ACID, PLASMA: Lactic Acid, Venous: 3 mmol/L (ref 0.5–1.9)

## 2020-09-26 MED ORDER — IOHEXOL 300 MG/ML  SOLN
100.0000 mL | Freq: Once | INTRAMUSCULAR | Status: AC | PRN
  Administered 2020-09-26: 100 mL via INTRAVENOUS

## 2020-09-26 MED ORDER — IBUPROFEN 400 MG PO TABS
600.0000 mg | ORAL_TABLET | Freq: Once | ORAL | Status: AC
  Administered 2020-09-26: 600 mg via ORAL
  Filled 2020-09-26: qty 1

## 2020-09-26 MED ORDER — SODIUM CHLORIDE 0.9 % IV SOLN: 1000.0000 mL | INTRAVENOUS | Status: DC

## 2020-09-26 MED ORDER — SODIUM CHLORIDE 0.9 % IV SOLN
INTRAVENOUS | Status: AC | PRN
  Administered 2020-09-26: 1000 mL via INTRAVENOUS

## 2020-09-26 MED ORDER — SODIUM CHLORIDE 0.9 % IV BOLUS (SEPSIS)
1000.0000 mL | Freq: Once | INTRAVENOUS | Status: AC
  Administered 2020-09-26: 1000 mL via INTRAVENOUS
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
## 2020-09-26 NOTE — ED Notes (Signed)
Field collar replaced with [place] collar with MD at bedside; C-spine precautions maintained

## 2020-09-26 NOTE — ED Notes (Signed)
Back from CT

## 2020-09-26 NOTE — ED Notes (Signed)
Attempted to call family multiple times. Family did not pick up. Pt was offered a taxi, but he refused.  Pt was able to ambulate without assistance.
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
## 2020-09-26 NOTE — ED Notes (Signed)
Patient transported to CT 

## 2020-10-02 ENCOUNTER — Other Ambulatory Visit: Payer: Self-pay

## 2020-10-02 ENCOUNTER — Encounter (HOSPITAL_COMMUNITY): Payer: Self-pay

## 2020-10-02 ENCOUNTER — Emergency Department (HOSPITAL_COMMUNITY)
Admission: EM | Admit: 2020-10-02 | Discharge: 2020-10-02 | Disposition: A | Discharge status: Home / Self Care | Payer: Self-pay | Attending: Emergency Medicine | Admitting: Emergency Medicine

## 2020-10-02 ENCOUNTER — Emergency Department (HOSPITAL_COMMUNITY): Payer: Self-pay

## 2020-10-02 DIAGNOSIS — J45909 Unspecified asthma, uncomplicated: Secondary | ICD-10-CM | POA: Insufficient documentation

## 2020-10-02 DIAGNOSIS — F17210 Nicotine dependence, cigarettes, uncomplicated: Secondary | ICD-10-CM | POA: Insufficient documentation

## 2020-10-02 DIAGNOSIS — L03213 Periorbital cellulitis: Secondary | ICD-10-CM | POA: Insufficient documentation

## 2020-10-02 LAB — CBC WITH DIFFERENTIAL/PLATELET
Abs Immature Granulocytes: 0.09 10*3/uL — ABNORMAL HIGH (ref 0.00–0.07)
Basophils Absolute: 0 10*3/uL (ref 0.0–0.1)
Basophils Relative: 0 %
Eosinophils Absolute: 0.1 10*3/uL (ref 0.0–0.5)
Eosinophils Relative: 1 %
HCT: 43.6 % (ref 39.0–52.0)
Hemoglobin: 14.6 g/dL (ref 13.0–17.0)
Immature Granulocytes: 1 %
Lymphocytes Relative: 11 %
Lymphs Abs: 1.2 10*3/uL (ref 0.7–4.0)
MCH: 30.4 pg (ref 26.0–34.0)
MCHC: 33.5 g/dL (ref 30.0–36.0)
MCV: 90.8 fL (ref 80.0–100.0)
Monocytes Absolute: 1 10*3/uL (ref 0.1–1.0)
Monocytes Relative: 8 %
Neutro Abs: 9.4 10*3/uL — ABNORMAL HIGH (ref 1.7–7.7)
Neutrophils Relative %: 79 %
Platelets: 290 10*3/uL (ref 150–400)
RBC: 4.8 MIL/uL (ref 4.22–5.81)
RDW: 12.9 % (ref 11.5–15.5)
WBC: 11.8 10*3/uL — ABNORMAL HIGH (ref 4.0–10.5)
nRBC: 0 % (ref 0.0–0.2)

## 2020-10-02 LAB — URINALYSIS, ROUTINE W REFLEX MICROSCOPIC
Bilirubin Urine: NEGATIVE
Glucose, UA: NEGATIVE mg/dL
Hgb urine dipstick: NEGATIVE
Ketones, ur: NEGATIVE mg/dL
Leukocytes,Ua: NEGATIVE
Nitrite: NEGATIVE
Protein, ur: NEGATIVE mg/dL
Specific Gravity, Urine: 1.004 — ABNORMAL LOW (ref 1.005–1.030)
pH: 7 (ref 5.0–8.0)

## 2020-10-02 LAB — COMPREHENSIVE METABOLIC PANEL
ALT: 13 U/L (ref 0–44)
AST: 14 U/L — ABNORMAL LOW (ref 15–41)
Albumin: 3.6 g/dL (ref 3.5–5.0)
Alkaline Phosphatase: 76 U/L (ref 38–126)
Anion gap: 11 (ref 5–15)
BUN: 7 mg/dL (ref 6–20)
CO2: 22 mmol/L (ref 22–32)
Calcium: 9.7 mg/dL (ref 8.9–10.3)
Chloride: 104 mmol/L (ref 98–111)
Creatinine, Ser: 0.7 mg/dL (ref 0.61–1.24)
GFR, Estimated: >60 mL/min (ref >60)
Glucose, Bld: 150 mg/dL — ABNORMAL HIGH (ref 70–99)
Potassium: 4.1 mmol/L (ref 3.5–5.1)
Sodium: 137 mmol/L (ref 135–145)
Total Bilirubin: 0.7 mg/dL (ref 0.3–1.2)
Total Protein: 6.8 g/dL (ref 6.5–8.1)

## 2020-10-02 LAB — LACTIC ACID, PLASMA: Lactic Acid, Venous: 1.6 mmol/L (ref 0.5–1.9)

## 2020-10-02 MED ORDER — SULFAMETHOXAZOLE-TRIMETHOPRIM 800-160 MG PO TABS: 1.0000 | ORAL_TABLET | Freq: Two times a day (BID) | ORAL | 0 refills | Status: AC

## 2020-10-02 MED ORDER — AMOXICILLIN-POT CLAVULANATE 875-125 MG PO TABS: 1.0000 | ORAL_TABLET | Freq: Two times a day (BID) | ORAL | 0 refills | Status: AC

## 2020-10-02 MED ORDER — IOHEXOL 300 MG/ML  SOLN
75.0000 mL | Freq: Once | INTRAMUSCULAR | Status: AC | PRN
  Administered 2020-10-02: 75 mL via INTRAVENOUS

## 2020-10-02 MED ORDER — SODIUM CHLORIDE 0.9 % IV BOLUS
1000.0000 mL | Freq: Once | INTRAVENOUS | Status: AC
  Administered 2020-10-02: 1000 mL via INTRAVENOUS

## 2020-10-02 MED ORDER — FENTANYL CITRATE (PF) 100 MCG/2ML IJ SOLN
50.0000 ug | Freq: Once | INTRAMUSCULAR | Status: AC
  Administered 2020-10-02: 50 ug via INTRAVENOUS
  Filled 2020-10-02: qty 2

## 2020-10-02 MED ORDER — SODIUM CHLORIDE 0.9 % IV SOLN
3.0000 g | Freq: Once | INTRAVENOUS | Status: AC
  Administered 2020-10-02: 3 g via INTRAVENOUS
  Filled 2020-10-02 (×2): qty 8

## 2020-10-02 MED ORDER — SULFAMETHOXAZOLE-TRIMETHOPRIM 800-160 MG PO TABS
1.0000 | ORAL_TABLET | Freq: Once | ORAL | Status: AC
  Administered 2020-10-02: 1 via ORAL
  Filled 2020-10-02: qty 1

## 2020-10-02 MED ORDER — ACETAMINOPHEN 325 MG PO TABS
650.0000 mg | ORAL_TABLET | Freq: Once | ORAL | Status: AC
  Administered 2020-10-02: 650 mg via ORAL
  Filled 2020-10-02: qty 2

## 2020-10-02 NOTE — ED Notes (Signed)
Patient transported to CT 
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
## 2020-10-02 NOTE — ED Triage Notes (Signed)
Pt c/o left eyelid swelling and redness since yesterday. Pt unable to open eyelid and reports discharge coming from sutured wound (left eyebrow).

## 2020-10-02 NOTE — Discharge Instructions (Addendum)
You were evaluated in the Emergency Department and after careful evaluation, we did not find any emergent condition requiring admission or further testing in the hospital.  Your exam/testing today is overall reassuring.  Symptoms seem to be due to skin infection.  Please take both the Bactrim and Augmentin antibiotics as directed, use Tylenol and Motrin for pain,  Please return to the Emergency Department if you experience any worsening of your condition.   Thank you for allowing [name] to be a part of your care.

## 2020-10-02 NOTE — ED Notes (Signed)
E-signature pad unavailable at time of pt discharge. This RN discussed discharge materials with pt and answered all pt questions. Pt stated understanding of discharge material. ? ?

## 2020-10-07 LAB — CULTURE, BLOOD (ROUTINE X 2)
Culture: NO GROWTH
Culture: NO GROWTH
Special Requests: ADEQUATE
Special Requests: ADEQUATE

## 2020-10-20 ENCOUNTER — Ambulatory Visit: Payer: Self-pay | Admitting: Internal Medicine

## 2020-10-28 ENCOUNTER — Telehealth: Payer: Self-pay | Admitting: Internal Medicine

## 2020-10-28 NOTE — Telephone Encounter (Signed)
Pt father on [place], [place] Father [phone]   [phone]  Is calling to speak to Opal Sidles - pt is in needing of medication refills. Father does not know the names. Pt will be transition from [facility] to [place] . Needing 1 month supply and one refill. [place]- [phone]
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
## 2020-12-12 ENCOUNTER — Ambulatory Visit: Payer: Self-pay | Admitting: Physician Assistant

## 2020-12-12 ENCOUNTER — Other Ambulatory Visit: Payer: Self-pay

## 2020-12-12 ENCOUNTER — Encounter: Payer: Self-pay | Admitting: Physician Assistant

## 2020-12-12 VITALS — BP 109/78 | HR 83 | Temp 98.8°F | Wt 163.0 lb

## 2020-12-12 DIAGNOSIS — K5090 Crohn's disease, unspecified, without complications: Secondary | ICD-10-CM

## 2020-12-12 DIAGNOSIS — F1411 Cocaine abuse, in remission: Secondary | ICD-10-CM

## 2020-12-12 DIAGNOSIS — Z7689 Persons encountering health services in other specified circumstances: Secondary | ICD-10-CM

## 2020-12-12 DIAGNOSIS — F1011 Alcohol abuse, in remission: Secondary | ICD-10-CM

## 2020-12-12 DIAGNOSIS — R7989 Other specified abnormal findings of blood chemistry: Secondary | ICD-10-CM

## 2020-12-12 DIAGNOSIS — F17200 Nicotine dependence, unspecified, uncomplicated: Secondary | ICD-10-CM

## 2020-12-12 DIAGNOSIS — F489 Nonpsychotic mental disorder, unspecified: Secondary | ICD-10-CM

## 2020-12-12 NOTE — Progress Notes (Signed)
BP 109/78   Pulse 83   Temp 98.8 F (37.1 C)   Wt 163 lb (73.9 kg)   SpO2 96%   BMI 22.73 kg/m    Subjective:    Patient ID: Brendan Ochoa, male    DOB: [birth year], 45 y.o.   MRN: [id number]  HPI: Brendan Ochoa is a 45 y.o. male presenting on 12/12/2020 for Establish Care   HPI   Pt had a negative covid 19 screening questionnaire.   Chief Complaint  Patient presents with   New Patient (Initial Visit)     Pt was Last seen at this office 09/25/2017.  He has been back in [place] for a week.  He had been at [facility] for a month prior to that.  He had been going to clinic in [facility] because he was living there  Pt is requesting testosterone replacement and "move free" (glucosamine and chondroitin)  He says his Crohns hasn't been bothering him.   Pt got first covid vaccination shot but did not get a second one.  He says he has never used IV drugs  Lipids labs good 04/19/17     Relevant past medical, surgical, family and social history reviewed and updated as indicated. Interim medical history since our last visit reviewed. Allergies and medications reviewed and updated.   Current Outpatient Medications:    acetaminophen (TYLENOL) 500 MG tablet, Take 1,000 mg by mouth every 6 (six) hours as needed for moderate pain or headache., Disp: , Rfl:    cloNIDine (CATAPRES) 0.2 MG tablet, Take 0.2 mg by mouth 2 (two) times daily. Pt to call back with correct dosage, Disp: , Rfl:    Glucos-Chond-Hyal Ac-Ca Fructo (MOVE FREE JOINT HEALTH ADVANCE) TABS, Take by mouth., Disp: , Rfl:    ibuprofen (ADVIL) 200 MG tablet, Take 400 mg by mouth every 6 (six) hours as needed for headache or moderate pain., Disp: , Rfl:    lamoTRIgine (LAMICTAL) 25 MG tablet, Take 25 mg by mouth 2 (two) times daily., Disp: , Rfl:    loratadine (CLARITIN) 10 MG tablet, Take 10 mg by mouth daily as needed for allergies., Disp: , Rfl:    Multiple Vitamin (MULTIVITAMIN WITH MINERALS) TABS  tablet, Take 1 tablet by mouth daily., Disp: 30 tablet, Rfl: 3   albuterol (VENTOLIN HFA) 108 (90 Base) MCG/ACT inhaler, Inhale 2 puffs into the lungs every 6 (six) hours as needed for wheezing or shortness of breath. (Patient not taking: Reported on 12/12/2020), Disp: 8 g, Rfl: 2     Review of Systems  Per HPI unless specifically indicated above     Objective:    BP 109/78   Pulse 83   Temp 98.8 F (37.1 C)   Wt 163 lb (73.9 kg)   SpO2 96%   BMI 22.73 kg/m   Wt Readings from Last 3 Encounters:  12/12/20 163 lb (73.9 kg)  10/02/20 154 lb 15.7 oz (70.3 kg)  09/26/20 155 lb (70.3 kg)    Physical Exam Vitals reviewed.  Constitutional:      General: He is not in acute distress.    Appearance: He is well-developed. He is not ill-appearing.  HENT:     Head: Normocephalic and atraumatic.     Right Ear: Tympanic membrane, ear canal and external ear normal.     Left Ear: Tympanic membrane, ear canal and external ear normal.  Eyes:     Extraocular Movements: Extraocular movements intact.     Conjunctiva/sclera: Conjunctivae  normal.     Pupils: Pupils are equal, round, and reactive to light.  Neck:     Thyroid: No thyromegaly.  Cardiovascular:     Rate and Rhythm: Normal rate and regular rhythm.  Pulmonary:     Effort: Pulmonary effort is normal.     Breath sounds: Normal breath sounds. No wheezing or rales.  Abdominal:     General: Bowel sounds are normal.     Palpations: Abdomen is soft. There is no mass.     Tenderness: There is no abdominal tenderness.  Musculoskeletal:     Cervical back: Neck supple.     Right lower leg: No edema.     Left lower leg: No edema.     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  Right thumb doesn't bend/fused.  Lymphadenopathy:     Cervical: No cervical adenopathy.  Skin:    General: Skin is warm and dry.     Findings: No rash.  Neurological:     Mental Status: He is alert and oriented to person, place, and time.     Motor:  No weakness or tremor.     Gait: Gait is intact.  Psychiatric:        Attention and Perception: Attention normal.        Mood and Affect: Affect is not inappropriate.        Speech: Speech normal.        Behavior: Behavior normal. Behavior is cooperative.          Assessment & Plan:    Encounter Diagnoses  Name Primary?   Encounter to establish care Yes   Cocaine abuse in remission ([place])    Alcohol abuse, in remission    Tobacco use disorder    Low testosterone    Mental health problem    Crohn's disease without complication, unspecified gastrointestinal tract location [facility])      -Refer to endocrinologist for low testosterone -pt was given [facility] financial assistance application -no additional labs at this time.  Discussed with pt that endocrinologist will want to repeat testosterone level and may order additional tests -pt to continue with [facility] for [place] issues  -pt is to follow up 1 year.  He is to contact office sooner prn

## 2021-04-26 ENCOUNTER — Encounter (HOSPITAL_COMMUNITY): Payer: Self-pay | Admitting: Student

## 2021-04-26 ENCOUNTER — Other Ambulatory Visit (HOSPITAL_COMMUNITY)
Admission: EM | Admit: 2021-04-26 | Discharge: 2021-04-28 | Disposition: A | Discharge status: Home / Self Care | Payer: No Payment, Other | Attending: Behavioral Health | Admitting: Behavioral Health

## 2021-04-26 DIAGNOSIS — F1024 Alcohol dependence with alcohol-induced mood disorder: Secondary | ICD-10-CM | POA: Diagnosis not present

## 2021-04-26 DIAGNOSIS — F17210 Nicotine dependence, cigarettes, uncomplicated: Secondary | ICD-10-CM | POA: Insufficient documentation

## 2021-04-26 DIAGNOSIS — Z20822 Contact with and (suspected) exposure to covid-19: Secondary | ICD-10-CM | POA: Insufficient documentation

## 2021-04-26 DIAGNOSIS — F1020 Alcohol dependence, uncomplicated: Secondary | ICD-10-CM

## 2021-04-26 DIAGNOSIS — Y908 Blood alcohol level of 240 mg/100 ml or more: Secondary | ICD-10-CM | POA: Insufficient documentation

## 2021-04-26 DIAGNOSIS — F1094 Alcohol use, unspecified with alcohol-induced mood disorder: Secondary | ICD-10-CM

## 2021-04-26 DIAGNOSIS — F32A Depression, unspecified: Secondary | ICD-10-CM | POA: Insufficient documentation

## 2021-04-26 DIAGNOSIS — F1410 Cocaine abuse, uncomplicated: Secondary | ICD-10-CM | POA: Insufficient documentation

## 2021-04-26 DIAGNOSIS — F1290 Cannabis use, unspecified, uncomplicated: Secondary | ICD-10-CM | POA: Insufficient documentation

## 2021-04-26 DIAGNOSIS — F10139 Alcohol abuse with withdrawal, unspecified: Secondary | ICD-10-CM | POA: Diagnosis present

## 2021-04-26 DIAGNOSIS — Z79899 Other long term (current) drug therapy: Secondary | ICD-10-CM | POA: Insufficient documentation

## 2021-04-26 NOTE — BH Assessment (Signed)
Comprehensive Clinical Assessment (CCA) Note  04/27/2021 [name] [id number]  Discharge Disposition: Margorie John, PA-C, reviewed pt's chart and information and met with pt face-to-face and determined pt should receive Continuous Assessment and be reviewed for [facility] tomorrow. Pt was accepted to [facility] for Continuous Assessment.  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 Acute risk factors for suicide include: N/A. Protective factors for this patient include: positive social support, positive therapeutic relationship, and hope for the future. Considering these factors, the overall suicide risk at this point appears to be none. Patient is not appropriate for outpatient follow up.  Therefore, no sitter is recommended for suicide precautions.  [place] [facility] from 04/26/2021 in [facility] ED from 10/02/2020 in [place] ED to Hosp-Admission (Discharged) from 07/28/2020 in [place] No Risk No Risk High Risk     Chief Complaint:  Chief Complaint  Patient presents with   Addiction Problem   Visit Diagnosis: F10.24, Alcohol-induced depressive disorder, With severe use disorder  CCA Screening, Triage and Referral (STR) Brendan Ochoa is a 45 year old patient who came to the [facility] due to ongoing SA. Pt shares he has been at [facility] for approximately 9 weeks. He states his drinking has gotten progressively worse since he got there and that he is currently drinking a case (24 12-ounce beers)/day. Pt states he's also switched brands of beer from Bud Lite to [employer] for the higher alcohol content. Pt shares he last drank 3-4 hours ago and that he drank approximately 15 beers today; he states he has been  vomiting daily for the last week and now feels drink whether he consumes EtOH or not.   Pt denies SI, though acknowledges, "I'm just tired of life." He denies he's ever attempted to kill himself or that he has a plan to kill himself. Pt denies he's ever been hospitalized for mental health concerns that are not associated with his SA. Pt denies HI, AVH, NSSIB, access to guns/weapons, or SA other than EtOH. Pt states he has a court date of January 6 due to driving with a revoked license.  Pt is oriented x5. His recent/remote memory is intact. Pt was cooperative throughout the assessment process. Pt's insight, judgement, and impulse control is impaired at this time.  Patient Reported Information How did you hear about [name]? Self  What Is the Reason for Your Visit/Call Today? Pt shares he has been at [facility] for approximately 9 weeks. He states his drinking has gotten progressively worse since he got there and that he is currently drinking a case 924 12-ounce beers)/day. Pt states he's also switched brands of beer from Bud Lite to [employer] for the higher alcohol content. Pt shares he last drank 3-4 hours ago and that he drank approximately 15 beers today; he states he has been vomiting daily for the last week and now feels drink whether he consumes EtOH or not. Pt denies SI, though acknowledges, "I'm just tired of life." He denies he's ever attempted to kill himself or that he has a plan to kill himself. Pt denies he's ever been hospitalized for mental health concerns that are not associated with his SA. Pt denies HI, AVH, NSSIB, access to guns/weapons, or SA other than EtOH. Pt states he has a court date of  January 6 due to driving with a revoked license.  How Long Has This Been Causing You Problems? 1-6 months  What Do You Feel Would Help You the Most Today? Alcohol or Drug Use Treatment; Medication(s)   Have You Recently Had Any Thoughts About Hurting Yourself? No  Are You Planning to Commit  Suicide/Harm Yourself At This time? No   Have you Recently Had Thoughts About [place]? No  Are You Planning to Harm Someone at This Time? No  Explanation: No data recorded  Have You Used Any Alcohol or Drugs in the Past 24 Hours? Yes  How Long Ago Did You Use Drugs or Alcohol? No data recorded What Did You Use and How Much? Pt states he drank 15 12-ounce beers today   Do You Currently Have a Therapist/Psychiatrist? No  Name of Therapist/Psychiatrist: No data recorded  Have You Been Recently Discharged From Any Office Practice or Programs? No  Explanation of Discharge From Practice/Program: No data recorded    CCA Screening Triage Referral Assessment Type of Contact: Face-to-Face  Telemedicine Service Delivery:   Is this Initial or Reassessment? No data recorded Date Telepsych consult ordered in CH[facility]:  No data recorded Time Telepsych consult ordered in [facility]:  No data recorded Location of Assessment: [facility] Assessment Services  Provider Location: [facility] Assessment Services   Collateral Involvement: None currently   Does Patient Have a [place]? No data recorded Name and Contact of Legal Guardian: No data recorded If Minor and Not Living with Parent(s), Who has Custody? N/A  Is CPS involved or ever been involved? Never  Is APS involved or ever been involved? Never   Patient Determined To Be At Risk for Harm To Self or Others Based on Review of Patient Reported Information or Presenting Complaint? No  Method: No data recorded Availability of Means: No data recorded Intent: No data recorded Notification Required: No data recorded Additional Information for Danger to Others Potential: No data recorded Additional Comments for Danger to Others Potential: No data recorded Are There Guns or Other Weapons in Your Home? No data recorded Types of Guns/Weapons: No data recorded Are These Weapons Safely Secured?                            No  data recorded Who Could Verify You Are Able To Have These Secured: No data recorded Do You Have any Outstanding Charges, Pending Court Dates, Parole/Probation? No data recorded Contacted To Inform of Risk of Harm To Self or Others: -- (N/A)    Does Patient Present under Involuntary Commitment? No  IVC Papers Initial File Date: No data recorded  [place] of Residence: [name]   Patient Currently Receiving the Following Services: -- (Sober Living at [facility])   Determination of Need: Urgent (48 hours)   Options For Referral: [facility] Urgent Care; Medication Management; Outpatient Therapy; Other: Comment (Review for [facility] tomorrow)     CCA Biopsychosocial Patient Reported Schizophrenia/Schizoaffective Diagnosis in Past: No   Strengths: Pt is voluntarily seeking treatment for his SA.   Mental Health Symptoms Depression:   Change in energy/activity; Difficulty Concentrating; Fatigue; Hopelessness; Worthlessness; Increase/decrease in appetite; Sleep (too much or little)   Duration of Depressive symptoms:  Duration of Depressive Symptoms: Greater than two weeks   Mania:   None   Anxiety:    Difficulty concentrating; Fatigue; Sleep; Tension; Worrying   Psychosis:   None   Duration of  Psychotic symptoms:    Trauma:   None   Obsessions:   None   Compulsions:   None   Inattention:   None   Hyperactivity/Impulsivity:   N/A   Oppositional/Defiant Behaviors:   None   Emotional Irregularity:   Chronic feelings of emptiness; Mood lability; Potentially harmful impulsivity   Other Mood/Personality Symptoms:   None noted    Mental Status Exam Appearance and self-care  Stature:   Average   Weight:   Average weight   Clothing:   Neat/clean   Grooming:   Normal   Cosmetic use:   None   Posture/gait:   Normal   Motor activity:   Not Remarkable   Sensorium  Attention:   Normal   Concentration:   Normal   Orientation:   X5   Recall/memory:    Normal   Affect and Mood  Affect:   Depressed   Mood:   Depressed   Relating  Eye contact:   Normal   Facial expression:   Depressed   Attitude toward examiner:   Cooperative   Thought and Language  Speech flow:  Soft   Thought content:   Appropriate to Mood and Circumstances   Preoccupation:   None   Hallucinations:   None   Organization:  No data recorded  [employer] of Knowledge:   Average   Intelligence:   Average   Abstraction:   Normal   Judgement:   Impaired   Reality Testing:   Adequate   Insight:   Fair   Decision Making:   Impulsive   Social Functioning  Social Maturity:   Impulsive   Social Judgement:   Normal   Stress  Stressors:   Housing; Scientist, research (physical sciences); Work; Transitions   Coping Ability:   Exhausted   Skill Deficits:   Activities of daily living; Self-control   Supports:   Usual; Friends/Service system     Religion: Religion/Spirituality Are You A Religious Person?: No How Might This Affect Treatment?: Not assessed  Leisure/Recreation: Leisure / Recreation Do You Have Hobbies?: No  Exercise/Diet: Exercise/Diet Do You Exercise?: No Have You Gained or Lost A Significant Amount of Weight in the Past Six Months?: No Do You Follow a Special Diet?: No Do You Have Any Trouble Sleeping?: Yes Explanation of Sleeping Difficulties: Pt shares he has difficulties falling asleep and is typically up after 2-ish hours   CCA Employment/Education Employment/Work Situation: Employment / Work Situation Employment Situation: Employed Work Stressors: 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 Job has Been Impacted by Current Illness: Yes Describe how Patient's Job has Been Impacted: Pt feels sick frequently Has Patient ever Been in the [employer]?: No  Education: Education Is Patient Currently Attending School?: No Last Grade Completed:  (Some college at [employer]) Did You Attend College?:  Yes What Type of College Degree Do you Have?: Some college at [employer] Did You Have An Individualized Education Program (IIEP): No Did You Have Any Difficulty At School?: No Patient's Education Has Been Impacted by Current Illness: No   CCA Family/Childhood History Family and Relationship History: Family history Marital status: Single Does patient have children?: No  Childhood History:  Childhood History By whom was/is the patient raised?:  (Not assessed) Did patient suffer any verbal/emotional/physical/sexual abuse as a child?: No Did patient suffer from severe childhood neglect?: No Has patient ever been sexually abused/assaulted/raped as an adolescent or adult?: No Was the patient ever a victim of a crime or  a disaster?: No Witnessed domestic violence?: No Has patient been affected by domestic violence as an adult?: No  Child/Adolescent Assessment:     CCA Substance Use Alcohol/Drug Use: Alcohol / Drug Use Pain Medications: See MAR Prescriptions: See MAR Over the Counter: See MAR History of alcohol / drug use?: Yes Longest period of sobriety (when/how long): 2 years sober approximately 10 years ago Negative Consequences of Use: Personal relationships, Legal, Financial Withdrawal Symptoms: Agitation, Sweats, Weakness, Nausea / Vomiting, Irritability, Fever / Chills, DTs, Patient aware of relationship between substance abuse and physical/medical complications, Tremors Substance #1 Name of Substance 1: EtOH 1 - Age of First Use: Unknown 1 - Amount (size/oz): 24 12-ounce beers 1 - Frequency: Daily 1 - Duration: Unknown 1 - Last Use / Amount: Today 1 - Method of Aquiring: Purchase 1- Route of Use: Oral                       ASAM's:  Six Dimensions of Multidimensional Assessment  Dimension 1:  Acute Intoxication and/or Withdrawal Potential:   Dimension 1:  Description of individual's past and current experiences of substance use and withdrawal: Pt shares he's  experienced VH, sweats, nausea and vomiting, shakes, DTs, etc when w/d  Dimension 2:  Biomedical Conditions and Complications:   Dimension 2:  Description of patient's biomedical conditions and  complications: Biomedical conditions/complications are related to pt's SA  Dimension 3:  Emotional, Behavioral, or Cognitive Conditions and Complications:  Dimension 3:  Description of emotional, behavioral, or cognitive conditions and complications: Pt is able to identify the correlation between SA and EBC conditions  Dimension 4:  Readiness to Change:  Dimension 4:  Description of Readiness to Change criteria: Pt shares he is ready to stop drinking; he is getting older and it's getting harder for him to recover  Dimension 5:  Relapse, Continued use, or Continued Problem Potential:  Dimension 5:  Relapse, continued use, or continued problem potential critiera description: Pt has an extensive hx of relapsing  Dimension 6:  Recovery/Living Environment:  Dimension 6:  Recovery/Iiving environment criteria description: Pt is currently living at [facility], though he states they don't really care if they use  ASAM Severity Score: ASAM's Severity Rating Score: 12  ASAM Recommended Level of Treatment: ASAM Recommended Level of Treatment: Level III Residential Treatment   Substance use Disorder (SUD) Substance Use Disorder (SUD)  Checklist Symptoms of Substance Use: Continued use despite having a persistent/recurrent physical/psychological problem caused/exacerbated by use, Continued use despite persistent or recurrent social, interpersonal problems, caused or exacerbated by use, Evidence of tolerance, Evidence of withdrawal (Comment), Persistent desire or unsuccessful efforts to cut down or control use, Presence of craving or strong urge to use, Substance(s) often taken in larger amounts or over longer times than was intended  Recommendations for Services/Supports/Treatments: Recommendations for  Services/Supports/Treatments Recommendations For Services/Supports/Treatments: Medication Management, Individual Therapy, Other (Comment) (Sober Living, Continuous Assessment)  Discharge Disposition: Discharge Disposition Medical Exam completed: Yes Disposition of Patient: Admit Mode of transportation if patient is discharged/movement?: N/A  Margorie John, PA-C, reviewed pt's chart and information and met with pt face-to-face and determined pt should receive Continuous Assessment and be reviewed for [facility] tomorrow. Pt was accepted to [facility] for Continuous Assessment.  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   Epididymal cyst 06/14/2020   Unintentional weight loss 06/14/2020   Abnormal chest x-ray 06/14/2020   Elevated blood pressure reading without diagnosis of hypertension 05/27/2020   Influenza vaccination declined 05/27/2020   AKI (acute kidney injury) ([place]) 01/01/2020   Vomiting 01/01/2020   Cocaine abuse ([place]) 01/01/2020   Alcohol abuse 01/01/2020   Ileitis 02/18/2018   HYPERGLYCEMIA 11/01/2009   Tobacco dependence 07/18/2007   SUBSTANCE ABUSE, MULTIPLE 07/18/2007   ALLERGIC RHINITIS 07/18/2007   ASTHMA 07/18/2007   GERD 07/18/2007   Crohn's disease ([place]) 07/18/2007     Referrals to Alternative Service(s): Referred to Alternative Service(s):   Place:   Date:   Time:    Referred to Alternative Service(s):   Place:   Date:   Time:    Referred to Alternative Service(s):   Place:   Date:   Time:    Referred to Alternative Service(s):   Place:   Date:   Time:     Dannielle Burn, LMFT

## 2021-04-27 ENCOUNTER — Encounter (HOSPITAL_COMMUNITY): Payer: Self-pay | Admitting: Student

## 2021-04-27 ENCOUNTER — Other Ambulatory Visit: Payer: Self-pay

## 2021-04-27 DIAGNOSIS — F32A Depression, unspecified: Secondary | ICD-10-CM | POA: Diagnosis not present

## 2021-04-27 DIAGNOSIS — F1024 Alcohol dependence with alcohol-induced mood disorder: Secondary | ICD-10-CM | POA: Diagnosis not present

## 2021-04-27 DIAGNOSIS — F1410 Cocaine abuse, uncomplicated: Secondary | ICD-10-CM | POA: Diagnosis not present

## 2021-04-27 DIAGNOSIS — Z20822 Contact with and (suspected) exposure to covid-19: Secondary | ICD-10-CM | POA: Diagnosis not present

## 2021-04-27 DIAGNOSIS — F10139 Alcohol abuse with withdrawal, unspecified: Secondary | ICD-10-CM | POA: Diagnosis present

## 2021-04-27 LAB — LIPID PANEL
Cholesterol: 209 mg/dL — ABNORMAL HIGH (ref 0–200)
HDL: 66 mg/dL (ref >40)
LDL Cholesterol: UNDETERMINED mg/dL (ref 0–99)
Total CHOL/HDL Ratio: 3.2 RATIO
Triglycerides: 615 mg/dL — ABNORMAL HIGH (ref <150)
VLDL: UNDETERMINED mg/dL (ref 0–40)

## 2021-04-27 LAB — CBC WITH DIFFERENTIAL/PLATELET
Abs Immature Granulocytes: 0.01 10*3/uL (ref 0.00–0.07)
Basophils Absolute: 0 10*3/uL (ref 0.0–0.1)
Basophils Relative: 1 %
Eosinophils Absolute: 0.2 10*3/uL (ref 0.0–0.5)
Eosinophils Relative: 3 %
HCT: 45.5 % (ref 39.0–52.0)
Hemoglobin: 15.9 g/dL (ref 13.0–17.0)
Immature Granulocytes: 0 %
Lymphocytes Relative: 55 %
Lymphs Abs: 3.4 10*3/uL (ref 0.7–4.0)
MCH: 30.6 pg (ref 26.0–34.0)
MCHC: 34.9 g/dL (ref 30.0–36.0)
MCV: 87.7 fL (ref 80.0–100.0)
Monocytes Absolute: 0.4 10*3/uL (ref 0.1–1.0)
Monocytes Relative: 7 %
Neutro Abs: 2.1 10*3/uL (ref 1.7–7.7)
Neutrophils Relative %: 34 %
Platelets: 269 10*3/uL (ref 150–400)
RBC: 5.19 MIL/uL (ref 4.22–5.81)
RDW: 13.6 % (ref 11.5–15.5)
WBC: 6.2 10*3/uL (ref 4.0–10.5)
nRBC: 0 % (ref 0.0–0.2)

## 2021-04-27 LAB — POCT URINE DRUG SCREEN - MANUAL ENTRY (I-SCREEN)
POC Amphetamine UR: NOT DETECTED
POC Buprenorphine (BUP): NOT DETECTED
POC Cocaine UR: NOT DETECTED
POC Marijuana UR: NOT DETECTED
POC Methadone UR: NOT DETECTED
POC Methamphetamine UR: NOT DETECTED
POC Morphine: NOT DETECTED
POC Oxazepam (BZO): NOT DETECTED
POC Oxycodone UR: NOT DETECTED
POC Secobarbital (BAR): NOT DETECTED

## 2021-04-27 LAB — ETHANOL: Alcohol, Ethyl (B): 251 mg/dL — ABNORMAL HIGH (ref <10)

## 2021-04-27 LAB — COMPREHENSIVE METABOLIC PANEL
ALT: 23 U/L (ref 0–44)
AST: 38 U/L (ref 15–41)
Albumin: 4 g/dL (ref 3.5–5.0)
Alkaline Phosphatase: 89 U/L (ref 38–126)
Anion gap: 9 (ref 5–15)
BUN: 13 mg/dL (ref 6–20)
CO2: 24 mmol/L (ref 22–32)
Calcium: 9.5 mg/dL (ref 8.9–10.3)
Chloride: 105 mmol/L (ref 98–111)
Creatinine, Ser: 0.73 mg/dL (ref 0.61–1.24)
GFR, Estimated: >60 mL/min (ref >60)
Glucose, Bld: 89 mg/dL (ref 70–99)
Potassium: 4.2 mmol/L (ref 3.5–5.1)
Sodium: 138 mmol/L (ref 135–145)
Total Bilirubin: 0.8 mg/dL (ref 0.3–1.2)
Total Protein: 6.6 g/dL (ref 6.5–8.1)

## 2021-04-27 LAB — RESP PANEL BY RT-PCR (FLU A&B, COVID) ARPGX2
Influenza A by PCR: NEGATIVE
Influenza B by PCR: NEGATIVE
SARS Coronavirus 2 by RT PCR: NEGATIVE

## 2021-04-27 LAB — POC SARS CORONAVIRUS 2 AG -  ED: SARS Coronavirus 2 Ag: NEGATIVE

## 2021-04-27 LAB — HEMOGLOBIN A1C
Hgb A1c MFr Bld: 5.7 % — ABNORMAL HIGH (ref 4.8–5.6)
Mean Plasma Glucose: 116.89 mg/dL

## 2021-04-27 LAB — POC SARS CORONAVIRUS 2 AG: SARSCOV2ONAVIRUS 2 AG: NEGATIVE

## 2021-04-27 LAB — LDL CHOLESTEROL, DIRECT: Direct LDL: 103.4 mg/dL — ABNORMAL HIGH (ref 0–99)

## 2021-04-27 LAB — TSH: TSH: 2.502 u[IU]/mL (ref 0.350–4.500)

## 2021-04-27 MED ORDER — LORAZEPAM 1 MG PO TABS: 1.0000 mg | ORAL_TABLET | Freq: Every day | ORAL | Status: DC

## 2021-04-27 MED ORDER — MAGNESIUM HYDROXIDE 400 MG/5ML PO SUSP: 30.0000 mL | Freq: Every day | ORAL | Status: DC | PRN

## 2021-04-27 MED ORDER — GABAPENTIN 300 MG PO CAPS
300.0000 mg | ORAL_CAPSULE | Freq: Three times a day (TID) | ORAL | Status: DC
  Administered 2021-04-27 – 2021-04-28 (×4): 300 mg via ORAL
  Filled 2021-04-27: qty 21
  Filled 2021-04-27 (×4): qty 1

## 2021-04-27 MED ORDER — CLONIDINE HCL 0.1 MG PO TABS
0.1000 mg | ORAL_TABLET | Freq: Once | ORAL | Status: AC
  Administered 2021-04-27: 0.1 mg via ORAL
  Filled 2021-04-27: qty 1

## 2021-04-27 MED ORDER — LORAZEPAM 1 MG PO TABS
1.0000 mg | ORAL_TABLET | Freq: Four times a day (QID) | ORAL | Status: AC
  Administered 2021-04-27 – 2021-04-28 (×6): 1 mg via ORAL
  Filled 2021-04-27 (×6): qty 1

## 2021-04-27 MED ORDER — LORAZEPAM 1 MG PO TABS
1.0000 mg | ORAL_TABLET | Freq: Four times a day (QID) | ORAL | Status: DC | PRN
  Administered 2021-04-27: 1 mg via ORAL
  Filled 2021-04-27: qty 1

## 2021-04-27 MED ORDER — ONDANSETRON 4 MG PO TBDP
4.0000 mg | ORAL_TABLET | Freq: Four times a day (QID) | ORAL | Status: DC | PRN
  Administered 2021-04-27 (×2): 4 mg via ORAL
  Filled 2021-04-27 (×2): qty 1

## 2021-04-27 MED ORDER — LOPERAMIDE HCL 2 MG PO CAPS: 2.0000 mg | ORAL_CAPSULE | ORAL | Status: DC | PRN

## 2021-04-27 MED ORDER — ADULT MULTIVITAMIN W/MINERALS CH
1.0000 | ORAL_TABLET | Freq: Every day | ORAL | Status: DC
  Administered 2021-04-27 – 2021-04-28 (×2): 1 via ORAL
  Filled 2021-04-27: qty 7
  Filled 2021-04-27 (×2): qty 1

## 2021-04-27 MED ORDER — NICOTINE 21 MG/24HR TD PT24
21.0000 mg | MEDICATED_PATCH | Freq: Every day | TRANSDERMAL | Status: DC
  Administered 2021-04-27 – 2021-04-28 (×2): 21 mg via TRANSDERMAL
  Filled 2021-04-27 (×2): qty 1

## 2021-04-27 MED ORDER — ALUM & MAG HYDROXIDE-SIMETH 200-200-20 MG/5ML PO SUSP
30.0000 mL | ORAL | Status: DC | PRN
  Administered 2021-04-27 (×2): 30 mL via ORAL
  Filled 2021-04-27 (×2): qty 30

## 2021-04-27 MED ORDER — THIAMINE HCL 100 MG PO TABS
100.0000 mg | ORAL_TABLET | Freq: Every day | ORAL | Status: DC
  Administered 2021-04-28: 100 mg via ORAL
  Filled 2021-04-27: qty 1

## 2021-04-27 MED ORDER — ALBUTEROL SULFATE HFA 108 (90 BASE) MCG/ACT IN AERS
2.0000 | INHALATION_SPRAY | Freq: Four times a day (QID) | RESPIRATORY_TRACT | Status: DC | PRN
  Filled 2021-04-27 (×2): qty 8

## 2021-04-27 MED ORDER — LORAZEPAM 1 MG PO TABS: 1.0000 mg | ORAL_TABLET | Freq: Three times a day (TID) | ORAL | Status: DC

## 2021-04-27 MED ORDER — LORAZEPAM 1 MG PO TABS: 1.0000 mg | ORAL_TABLET | Freq: Two times a day (BID) | ORAL | Status: DC

## 2021-04-27 MED ORDER — HYDROXYZINE HCL 25 MG PO TABS: 25.0000 mg | ORAL_TABLET | Freq: Four times a day (QID) | ORAL | Status: DC | PRN

## 2021-04-27 MED ORDER — ACETAMINOPHEN 325 MG PO TABS
650.0000 mg | ORAL_TABLET | Freq: Four times a day (QID) | ORAL | Status: DC | PRN
  Administered 2021-04-27: 650 mg via ORAL
  Filled 2021-04-27: qty 2

## 2021-04-27 NOTE — ED Notes (Addendum)
Received report from Renae, LPN, of pt's acceptance to [facility] unit. Pt transferred to [facility] per NP orders for continuum of care. Pt verbalized understanding. Safety maintained.

## 2021-04-27 NOTE — ED Notes (Signed)
Pt requesting medication to help him sleep tonight. States he used to take Clonidine which helped him and reports Trazodone gave him "bad nightmares." Advised will let provider know when they arrive.

## 2021-04-27 NOTE — ED Notes (Signed)
Pt ate dinner. Denies concerns. Safety maintained.

## 2021-04-27 NOTE — Clinical Social Work Psych Note (Signed)
CSW Initial Note   CSW and Dr. Serafina Mitchell met with Brendan Ochoa for introduction and to begin discussions regarding treatment and potential discharge planning.   Brendan Ochoa shared with providers that he has  was binge drinking for two months while living at an [facility] in the area. He reports drinking a case of beer, which consisted of a 24, 12 ounce beers daily.   Brendan Ochoa shared that he recently completed the residential treatment program at [facility] back in August 2022. Following his discharge from [facility], Brendan Ochoa reports he spent 1 month in jail due to charges of theft. Brendan Ochoa shared that after he was released from jail, he began living in transitional housing at an [facility]. He reports he has lived at the [facility] for the last 9 weeks.   Brendan Ochoa shared that he and other roommates at his [facility] placement have relapsed and engage in substance use, while living there. He reports the house directors are unaware, however he does not know if that is the best environment to remain in while on the road to recovery. He reports he plans to speak with his sponsor and other supports to determine possible alternative living arrangements.   Brendan Ochoa reports his goal for his admission to the [facility], is to receive detox services for his ETOH use. He reports he has experienced severe withdrawal symptoms in the past, and wanted to be safe. Brendan Ochoa was started on am Ativan taper. It is expected for his taper to end Sunday or Monday. He reports he does not want his probation officer or [facility] staff notified of this admission.   Brendan Ochoa declined any residential treatment programs at this time. He did express interest in outpatient substance abuse services, however he reports he has established care with [facility] for outpatient psychiatric and substance abuse services. Brendan Ochoa also reports he plans to continue to follow up with his sponsor and [employer].    CSW will continue to follow for any additional concerns or  needs identified.       Radonna Ricker, MSW, LCSW Clinical Education officer, museum ([place]) [facility]

## 2021-04-27 NOTE — ED Provider Notes (Addendum)
Behavioral Health Admission H&P [facility] & OBS)  Date: 04/27/21 Patient Name: Brendan Ochoa MRN: [id number] Chief Complaint:  Chief Complaint  Patient presents with   Addiction Problem      Diagnoses:  Final diagnoses:  Alcohol-induced mood disorder with depressive symptoms ([place])  Alcohol use disorder, severe, dependence ([place])    HPI: Brendan Ochoa is a 45 year old male with documented past psychiatric history significant for alcohol use disorder, MDD, substance-induced psychotic disorder, cocaine abuse, mild THC abuse, and benzodiazepine abuse, as well as documented past medical history significant for asthma, GERD, Crohn's disease, ileitis, AKI, epididymal cyst, and acute encephalopathy, who presents to the [facility] behavioral health urgent care [facility]) unaccompanied as a voluntary walk-in, requesting assistance for detox/substance abuse treatment for alcohol abuse.  Patient reports that he is currently living in the [place] and has been living there for the past 9 weeks.  Patient states that despite living at [facility] for the past 9 weeks, he has been drinking 1 case of beer/24 12 ounce beers daily for the past 2.5 to 3 months.  Patient states that he is able to drink alcohol easily at [facility] because the staff there "does not care".  Patient reports that prior to relapsing on alcohol 2.5 to 3 months ago, he had been completely sober from drinking alcohol for about 2 to 3 weeks.  Patient reports that his last alcohol consumption was about 3 to 4 hours ago earlier today in which he states he had about 1512 ounce beers at that time.  Patient reports that his longest period of sobriety was 2 years, which was about 10 years ago.  He endorses history of the following alcohol withdrawal symptoms: Nausea, vomiting, chills, diaphoresis, tremor, and delirium tremens.  He denies history of seizures or any additional alcohol withdrawal symptoms.  He reports that the last time he  experienced delirium tremens was multiple years ago.  He endorses nausea, headache, and chills currently on exam.  He reports that he had 1 episode of emesis earlier today on 04/26/2021 and he states that he has had 1 episode of emesis daily for the past week.  Patient denies hematemesis.  Patient denies any additional physical symptoms on exam at this time.    Patient endorses smoking 1 pack of cigarettes daily.  He denies use of any illicit substances currently or within the past few months (UDS negative for all substances).  He reports that he attended a 30-day rehab/detox treatment program at [facility]  back in August 2022.  Patient states that prior to his living at [facility], he was in jail for 1 month due to charges of stealing while under the influence of methamphetamine and he states after being released from jail prior to living in [place], he was living in a halfway house and was kicked out, but he does not elaborate on this further patient states that he currently has a court date scheduled for 05/19/2020 for a charge related to driving with a revoked license.  Per chart review, patient was psychiatrically hospitalized at [facility] from 09/17/2020 to 09/21/2020 for alcohol use disorder.  Chart review also shows that the patient has been psychiatrically hospitalized on numerous other occasions as well, which include admissions at [facility] in April 2021, November 2020, in February 2020.  Chart review also shows that patient was psychiatrically hospitalized at [facility] in June 2012,  September 01, 2010, June 2011, December 2010, November 2010, July 2010, June 2010, July 2009, in November 2008.  Chart review also shows that patient was medically hospitalized for stimulant induced psychotic disorder and acute encephalopathy from 07/28/2020 to 08/02/2020, in which patient was followed by psychiatry  during that admission and patient was ultimately psych cleared with recommendations for outpatient psychiatry follow-up.  Patient denies any additional inpatient psychiatric hospitalizations since the May 2022 [facility] admission noted above.  Patient denies SI on exam, but patient does state "I'm just tired of life".  He denies history of any past suicide attempts or self-injurious behaviors via intentionally cutting or burning himself.  He denies HI or AVH.  He reports that his sleep has been poor recently, about 2 hours per night.  He endorses feelings of anhedonia as well as feelings of guilt, hopelessness, and worthlessness.  He endorses declines in energy, concentration, appetite over the past few months.  He denies weight changes over the past few months.  He states that his depressive symptoms are secondary to his alcohol abuse.  Patient denies taking any psychotropic medications at this time.  He reports that he was previously taking Lamictal and gabapentin, but he states that he stopped taking these medications about 6 to 7 months ago.  Per chart review, patient had prescriptions for gabapentin 300 mg 3 times daily on 02/06/2021 and a prescription for Lamictal 25 mg twice daily on 01/01/2021.  Patient denies taking any additional home medications at this time aside from albuterol inhaler.  Patient denies having a therapist or psychiatrist at this time.  He reports that he has an appointment [facility] [place] on 06/11/2020.  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   On exam, patient is sitting upright in no acute distress with disheveled appearance. Patient does smell strongly of alcohol on exam. Eye contact is good.  Speech is clear and coherent with normal rate and volume.  Mood is depressed with congruent affect.  Thought process is coherent, goal directed, and linear.  Patient is alert and oriented x4, cooperative, and answers all questions  appropriately during the evaluation.  No indication the patient is responding to internal/external stimuli on exam.  No delusional thought content noted on exam.  No apparent evidence of psychosis at this time on exam.  PHQ 2-9:  [place] Office Visit from 06/14/2020 in [place] Office Visit from 05/27/2020 in [place]  Thoughts that you would be better off dead, or of hurting yourself in some way Not at all Not at all  PHQ-9 Total Score 15 [place] [facility] from 10/02/2020 in [place] ED to Hosp-Admission (Discharged) from 07/28/2020 in [place] No Risk High Risk        Total Time spent with patient: 30 minutes  Musculoskeletal  Strength & Muscle Tone: within normal limits Gait & Station: normal Patient leans: N/A  Psychiatric Specialty Exam  Presentation General Appearance: Appropriate for Environment; Disheveled  Eye Contact:Good  Speech:Clear and Coherent; Normal Rate  Speech Volume:Normal  Handedness:No data recorded  Mood and Affect  Mood:Depressed  Affect:Congruent   Thought Process  Thought Processes:Coherent; Goal Directed; Linear  Descriptions of Associations:Intact  Orientation:Full (Time, Place and Person)  Thought Content:WDL; Logical  Diagnosis of Schizophrenia or Schizoaffective disorder in past:  No   Hallucinations:Hallucinations: None  Ideas of Reference:None  Suicidal Thoughts:Suicidal Thoughts: -- (Patient denies SI, but states "I'm just tired of life".)  Homicidal Thoughts:Homicidal Thoughts: No   Sensorium  Memory:Immediate Fair; Recent Fair; Remote Fair  Judgment:Impaired  Insight:Shallow; Present   Executive Functions  Concentration:Good  Attention Span:Good  [place]  Language:Good   Psychomotor Activity  Psychomotor  Activity:Psychomotor Activity: Normal   Assets  Assets:Communication Skills; Desire for Improvement; Housing; Leisure Time; Physical Health; Resilience; Talents/Skills; Vocational/Educational   Sleep  Sleep:Sleep: Poor Number of Hours of Sleep: 2   Nutritional Assessment (For OBS and FBC admissions only) Has the patient had a weight loss or gain of 10 pounds or more in the last 3 months?: No Has the patient had a decrease in food intake/or appetite?: Yes Does the patient have dental problems?: No Does the patient have eating habits or behaviors that may be indicators of an eating disorder including binging or inducing vomiting?: No Has the patient recently lost weight without trying?: 0 Has the patient been eating poorly because of a decreased appetite?: 1 Malnutrition Screening Tool Score: 1    Physical Exam Vitals reviewed.  Constitutional:      General: He is not in acute distress.    Appearance: He is not toxic-appearing or diaphoretic.     Comments: Patient smells of alcohol  HENT:     Head: Normocephalic and atraumatic.     Right Ear: External ear normal.     Left Ear: External ear normal.     Nose: Nose normal.  Eyes:     General:        Right eye: No discharge.        Left eye: No discharge.     Conjunctiva/sclera: Conjunctivae normal.  Cardiovascular:     Rate and Rhythm: Normal rate.  Pulmonary:     Effort: Pulmonary effort is normal. No respiratory distress.  Musculoskeletal:        General: Normal range of motion.     Cervical back: Normal range of motion.  Neurological:     General: No focal deficit present.     Mental Status: He is alert and oriented to person, place, and time.     Comments: No tremor noted.   Psychiatric:        Attention and Perception: Attention and perception normal. He does not perceive auditory or visual hallucinations.        Mood and Affect: Mood is depressed.        Speech: Speech normal.        Behavior: Behavior is not  agitated, slowed, aggressive, hyperactive or combative. Behavior is cooperative.        Thought Content: Thought content is not paranoid or delusional. Thought content does not include homicidal ideation.     Comments: Affect mood congruent. Patient denies SI, but states "I'm just tired of life".   Review of Systems  Constitutional:  Positive for chills, diaphoresis and malaise/fatigue. Negative for fever and weight loss.  HENT:  Negative for congestion.   Respiratory:  Negative for cough and shortness of breath.   Cardiovascular:  Negative for chest pain and palpitations.  Gastrointestinal:  Positive for nausea and vomiting. Negative for abdominal pain, constipation and diarrhea.  Musculoskeletal:  Negative for joint pain and myalgias.  Neurological:  Positive for tremors and headaches. Negative for dizziness and seizures.       + for history of DTs  Psychiatric/Behavioral:  Positive  for depression and substance abuse. Negative for hallucinations and memory loss. The patient has insomnia. The patient is not nervous/anxious.        Patient denies SI, but states "I'm just tired of life"  All other systems reviewed and are negative. He endorses history of the following alcohol withdrawal symptoms: Nausea, vomiting, chills, diaphoresis, tremor, and delirium tremens.  He denies history of seizures or any additional alcohol withdrawal symptoms.  He reports that the last time he experienced delirium tremens was multiple years ago.  He endorses nausea, headache, and chills currently on exam.  He reports that he had 1 episode of emesis earlier today on 04/26/2021 and he states that he has had 1 episode of emesis daily for the past week.  Patient denies hematemesis.  Patient denies any additional physical symptoms on exam at this time.    Vitals: Blood pressure (!) 134/98, pulse 88, temperature 98.3 F (36.8 C), temperature source Oral, resp. rate 20, SpO2 96 %. There is no height or weight on file to  calculate BMI.  Past Psychiatric History: past psychiatric history significant for alcohol use disorder, MDD, substance-induced psychotic disorder, cocaine abuse, mild THC abuse, and benzodiazepine abuse   Is the patient at risk to self? Yes  Has the patient been a risk to self in the past 6 months? Yes .    Has the patient been a risk to self within the distant past? No   Is the patient a risk to others? No   Has the patient been a risk to others in the past 6 months? No   Has the patient been a risk to others within the distant past? No   Past Medical History:  Past Medical History:  Diagnosis Date   Asthma    Cocaine abuse (HCC)    in past   Crohn disease (HCC) 2008   Depression    in past, not current as of 2019    ETOH abuse    history of alcohol use   GERD (gastroesophageal reflux disease)     Past Surgical History:  Procedure Laterality Date   BIOPSY  10/15/2017   Procedure: BIOPSY;  Surgeon: West Bali, MD;  Location: [facility];  Service: Endoscopy;;  terminal ileum colon   COLONOSCOPY WITH PROPOFOL N/A 10/15/2017   redundant colon, normal rectum, external and internal hemorrhoids, quiescent crohn's. Pathology: inactive chronicn nonspecific ileitis   ESOPHAGOGASTRODUODENOSCOPY (EGD) WITH PROPOFOL N/A 10/15/2017   normal esophagus, small hiatal hernia, gastritis, duodenitis   FINGER AMPUTATION Right 2012   cut finger table saw and surgically amputated    Family History:  Family History  Problem Relation Age of Onset   Stomach cancer Maternal Grandfather    Colon cancer Neg Hx     Social History:  Social History   Socioeconomic History   Marital status: Single    Spouse name: Not on file   Number of children: Not on file   Years of education: Not on file   Highest education level: Not on file  Occupational History   Not on file  Tobacco Use   Smoking status: Every Day    Packs/day: 0.50    Years: 15.00    Pack years: 7.50    Types: Cigarettes    Smokeless tobacco: Never   Tobacco comments:    1/4 to 1/2 pack per day   Vaping Use   Vaping Use: Never used  Substance and Sexual Activity   Alcohol use: Not Currently  Comment: alcoholic- last use [id number]   Drug use: Not Currently    Types: Cocaine, Marijuana, Methamphetamines    Comment: last cocaine use 10/10/20.  last meth april 2022   Sexual activity: Not on file  Other Topics Concern   Not on file  Social History Narrative   Not on file   Social Determinants of Health   Financial Resource Strain: Not on file  Food Insecurity: Not on file  Transportation Needs: Not on file  Physical Activity: Not on file  Stress: Not on file  Social Connections: Not on file  Intimate Partner Violence: Not on file    SDOH:  SDOH Screenings   Alcohol Screen: Not on file  Depression (PHQ2-9): Medium Risk   PHQ-2 Score: 15  Financial Resource Strain: Not on file  Food Insecurity: Not on file  Housing: Not on file  Physical Activity: Not on file  Social Connections: Not on file  Stress: Not on file  Tobacco Use: High Risk   Smoking Tobacco Use: Every Day   Smokeless Tobacco Use: Never   Passive Exposure: Not on file  Transportation Needs: Not on file    Last Labs:  No visits with results within 6 Month(s) from this visit.  Latest known visit with results is:  Admission on 10/02/2020, Discharged on 10/02/2020  Component Date Value Ref Range Status   Lactic Acid, Venous 10/02/2020 1.6  0.5 - 1.9 mmol/L Final   Performed at [facility], [place] [address] [place], [place] [address]   Sodium 10/02/2020 137  135 - 145 mmol/L Final   Potassium 10/02/2020 4.1  3.5 - 5.1 mmol/L Final   Chloride 10/02/2020 104  98 - 111 mmol/L Final   CO2 10/02/2020 22  22 - 32 mmol/L Final   Glucose, Bld 10/02/2020 150 (H)  70 - 99 mg/dL Final   Glucose reference range applies only to samples taken after fasting for at least 8 hours.   BUN 10/02/2020 7  6 - 20 mg/dL Final   Creatinine, Ser  10/02/2020 0.70  0.61 - 1.24 mg/dL Final   Calcium 10/02/2020 9.7  8.9 - 10.3 mg/dL Final   Total Protein 10/02/2020 6.8  6.5 - 8.1 g/dL Final   Albumin 10/02/2020 3.6  3.5 - 5.0 g/dL Final   AST 10/02/2020 14 (L)  15 - 41 U/L Final   ALT 10/02/2020 13  0 - 44 U/L Final   Alkaline Phosphatase 10/02/2020 76  38 - 126 U/L Final   Total Bilirubin 10/02/2020 0.7  0.3 - 1.2 mg/dL Final   GFR, Estimated 10/02/2020 >60  >60 mL/min Final   Comment: (NOTE) Calculated using the CKD-EPI Creatinine Equation (2021)    Anion gap 10/02/2020 11  5 - 15 Final   Performed at [facility] [address] [place], [place] [address]   WBC 10/02/2020 11.8 (H)  4.0 - 10.5 K/uL Final   RBC 10/02/2020 4.80  4.22 - 5.81 MIL/uL Final   Hemoglobin 10/02/2020 14.6  13.0 - 17.0 g/dL Final   HCT 10/02/2020 43.6  39.0 - 52.0 % Final   MCV 10/02/2020 90.8  80.0 - 100.0 fL Final   MCH 10/02/2020 30.4  26.0 - 34.0 pg Final   MCHC 10/02/2020 33.5  30.0 - 36.0 g/dL Final   RDW 10/02/2020 12.9  11.5 - 15.5 % Final   Platelets 10/02/2020 290  150 - 400 K/uL Final   nRBC 10/02/2020 0.0  0.0 - 0.2 % Final  Neutrophils Relative % 10/02/2020 79  % Final   Neutro Abs 10/02/2020 9.4 (H)  1.7 - 7.7 K/uL Final   Lymphocytes Relative 10/02/2020 11  % Final   Lymphs Abs 10/02/2020 1.2  0.7 - 4.0 K/uL Final   Monocytes Relative 10/02/2020 8  % Final   Monocytes Absolute 10/02/2020 1.0  0.1 - 1.0 K/uL Final   Eosinophils Relative 10/02/2020 1  % Final   Eosinophils Absolute 10/02/2020 0.1  0.0 - 0.5 K/uL Final   Basophils Relative 10/02/2020 0  % Final   Basophils Absolute 10/02/2020 0.0  0.0 - 0.1 K/uL Final   Immature Granulocytes 10/02/2020 1  % Final   Abs Immature Granulocytes 10/02/2020 0.09 (H)  0.00 - 0.07 K/uL Final   Performed at [facility], [place] [address], [place], [place] [address]   Color, Urine 10/02/2020 STRAW (A)  YELLOW Final   APPearance 10/02/2020 CLEAR  CLEAR Final   Specific Gravity, Urine  10/02/2020 1.004 (L)  1.005 - 1.030 Final   pH 10/02/2020 7.0  5.0 - 8.0 Final   Glucose, UA 10/02/2020 NEGATIVE  NEGATIVE mg/dL Final   Hgb urine dipstick 10/02/2020 NEGATIVE  NEGATIVE Final   Bilirubin Urine 10/02/2020 NEGATIVE  NEGATIVE Final   Ketones, ur 10/02/2020 NEGATIVE  NEGATIVE mg/dL Final   Protein, ur 10/02/2020 NEGATIVE  NEGATIVE mg/dL Final   Nitrite 10/02/2020 NEGATIVE  NEGATIVE Final   Leukocytes,Ua 10/02/2020 NEGATIVE  NEGATIVE Final   Performed at [facility], [place] [address] [place], [place] [address]   Specimen Description 10/02/2020 BLOOD RIGHT ARM   Final   Special Requests 10/02/2020 BOTTLES DRAWN AEROBIC AND ANAEROBIC Blood Culture adequate volume   Final   Culture 10/02/2020    Final                   Value:NO GROWTH 5 DAYS Performed at [facility], [place] [address], [place], [place] [address]    Report Status 10/02/2020 10/07/2020 FINAL   Final   Specimen Description 10/02/2020 BLOOD LEFT ARM   Final   Special Requests 10/02/2020 BOTTLES DRAWN AEROBIC ONLY Blood Culture adequate volume   Final   Culture 10/02/2020    Final                   Value:NO GROWTH 5 DAYS Performed at [facility], [place] [address] [place], [place] [address]    Report Status 10/02/2020 10/07/2020 FINAL   Final    Allergies: Patient has no known allergies.  PTA Medications: (Not in a hospital admission)   Medical Decision Making  Patient is a 45 year old male with past psychiatric and medical history as stated above who presents to the [facility] voluntarily requesting assistance with detox/substance abuse treatment for alcohol abuse.  Patient appears to be intoxicated at this time.  Based on patient's reported increased volume of daily alcohol consumption as well as patient's apparent current presentation of intoxication and patient's depressive symptoms which appear to be secondary to his alcohol use and appear to be significantly negatively impacting his ability to  function in his activities of daily living, believe that the patient meets criteria for continuous assessment at this time.  Patient may be appropriate for admission to [facility] facility based crisis [facility]) unit upon psychiatry reevaluation on 04/27/2021 after patient has become more sober, has metabolized the alcohol that he has consumed within the past 24 hours, and can be observed for development of any severe alcohol  withdrawal symptoms.   Recommendations  Based on my evaluation the patient does not appear to have an emergency medical condition.  Patient will be admitted to [facility] continuous assessment for further crisis stabilization and treatment.  Patient will be reevaluated by the treatment team on 04/27/2021 and disposition to be determined at that time.  Patient may be appropriate for admission to [facility] facility based crisis [facility]) unit upon psychiatry reevaluation on 04/27/2021 after patient has become more sober, has metabolized the alcohol that he has consumed within the past 24 hours, and can be observed for development of any severe alcohol withdrawal symptoms.  Based on patient's current presentation and past medical history, I do not suspect that patient's nausea and vomiting is indicative of an emergent medical process at this time and rather suspect that patient's nausea and vomiting is likely secondary to his alcohol consumption/alcohol withdrawal/issues related to poorly controlled GERD or Crohn's disease (patient is not on any medications for these conditions at this time).  We will order as needed Zofran for patient's nausea/vomiting (see details below).  Labs ordered and reviewed:  -PCR Flu A&B, COVID: Negative  -UDS: Negative for all substances  -CBC with differential: Within normal limits  -CMP: Within normal limits  -Hemoglobin A1c: Slightly elevated in the prediabetic range at 5.7%  -Ethanol: Elevated at 251 mg/dL.  This is consistent with patient's presentation, as patient smelled  strongly of alcohol and appears to be intoxicated on exam.  -Lipid panel: Total cholesterol slightly elevated at 209 mg/dL.  Triglycerides significantly elevated at 615 mg/dL.  VLDL  and LDL cholesterol unable to be calculated on lipid panel due to triglyceride values being over 400 mg/dL.  Direct LDL cholesterol slightly elevated at 103.4 mg/dL.  Lipid panel otherwise unremarkable. Patient will need to have his lipid panel values further worked up in the outpatient setting upon future discharge.  -TSH: Within normal limits at 2.502 uIU/mL  We will continue the following home medications at this time:  -Albuterol 108 MCG/ACT inhaler: 2 puffs inhalation every 6 hours as needed for wheezing/shortness of breath  For potential development of alcohol withdrawal symptoms, CIWA protocol initiated with the following medication orders:  -Ativan taper:   -Ativan 1 mg p.o. times daily (first dose on 04/27/2021 at 1000) for 6 doses followed by Ativan 1 mg p.o. 3 times daily (first dose on 04/28/2021 at 2200) for 3 doses followed by Ativan 1 mg p.o. twice daily (first dose on 04/29/2021 at 2200) for 2 doses followed by Ativan 1 mg p.o. daily on 05/01/2021 at 1000 for 1 dose  -Ativan 1 mg p.o. every 6 hours as needed for CIWA score greater than 10  -Vistaril 25 mg p.o. every 6 hours as needed for anxiety/agitation or CIWA score less than or equal to 10  -Imodium capsule 2 to 4 mg p.o. as needed for diarrhea or loose stools  -Multivitamin with minerals tablet: 1 tablet p.o. daily for nutritional supplementation  -Zofran ODT 4 mg p.o. every 6 hours as needed for nausea/vomiting  -Thiamine tablet 100 mg p.o. daily for nutritional supplementation   -Will restart patient's previous home medication of Gabapentin at 300 mg PO TID for alcohol withdrawal   Tylenol 650 mg p.o. every 6 hours as needed ordered for mild pain Maalox/Mylanta 30 mL p.o. every 4 hours as needed ordered for indigestion Milk of Magnesia 30 mL  p.o. daily as needed ordered for mild constipation  Nicotine patch 21 mg Daily ordered for tobacco cessation/nicotine cravings  Prescilla Sours, PA-C 04/27/21  12:19 AM

## 2021-04-27 NOTE — ED Notes (Signed)
Pt A&O x 4, presents from [facility], stating his drinking  has gotten progressively worse.   Pt admits to drinking a case of beer/day.  Complaint of nausea & vomiting x 1 week.  Complaint of nausea at present.  Comfort measures given.  Denies SI, HI or AVH.  Court Date scheduled for 09/17/95--[id number] license.  Pt calm & cooperative at present.  Monitoring for safety.

## 2021-04-27 NOTE — ED Notes (Signed)
Pt sitting in dining room watching TV. A&O x4, calm and cooperative. Pt denies SI/HI/AVH. Snack provided by [facility]. No signs of acute distress noted. Will continue to monitor for safety.

## 2021-04-27 NOTE — ED Notes (Signed)
Pt asleep in bed. Respirations even and unlabored. Will continue to monitor for safety. ?

## 2021-04-27 NOTE — ED Notes (Addendum)
Pt admitted to [facility] unit due to ETOH detox. Pt denies SI/HI/AVH. Pt states, " I currently live at a [facility] and I just need to come down off of this ETOH. I don't think I need a rehab facility but I have had some bad withdrawals in the past so I need to stay here until I'm stable. I hope to leave by Sunday. Calm, cooperative throughout assessment. Mild withdrawal sx to include mild tremors and mild anxiety. CIWA protocol initiated. CIWA score 3. No other sx noted.  Pt currently speaking with MD. Will monitor for safety.

## 2021-04-27 NOTE — ED Notes (Signed)
Admin PRN Zofran per Pt request. Pt currently resting on pull out bed w/o any distress. Safety maintained and will continue to monitor.

## 2021-04-27 NOTE — ED Notes (Addendum)
Pt lying on bed awake. Denies concerns at present. Denies withdrawal sx from ETOH. Ativan 1mg  given per detox protocol. Informed pt to notify staff with any needs or concerns. Safety maintained.

## 2021-04-27 NOTE — ED Provider Notes (Signed)
Behavioral Health Progress Note  Date and Time: 04/27/2021 10:18 AM Name: Brendan Ochoa MRN:  [id number]  Subjective:  Brendan Ochoa is a 45 year old male with documented past psychiatric history significant for alcohol use disorder, MDD, substance-induced psychotic disorder, cocaine abuse, mild THC abuse, and benzodiazepine abuse, as well as documented past medical history significant for asthma, GERD, Crohn's disease, ileitis, AKI, epididymal cyst, and acute encephalopathy, who presents to the [facility] behavioral health urgent care [facility]) unaccompanied as a voluntary walk-in, requesting assistance for detox/substance abuse treatment for alcohol abuse.  Patient reports that he is currently living in the [facility] and has been living there for the past 9 weeks.  Patient states that despite living at [facility] for the past 9 weeks, he has been drinking 1 case of beer/24 12 ounce beers daily for the past 2.5 to 3 months.  Patient states that he is able to drink alcohol easily at [facility] because the staff there "does not care".  Patient reports that prior to relapsing on alcohol 2.5 to 3 months ago, he had been completely sober from drinking alcohol for about 2 to 3 weeks.  Patient reports that his last alcohol consumption was about 3 to 4 hours ago earlier today in which he states he had about 15 (12) ounce beers at that time. Patient reports that his longest period of sobriety was 2 years, which was about 10 years ago.    Patient seen and evaluated face-to-face by this provider, chart reviewed and case discussed with Dr. Serafina Mitchell. On evaluation, patient is alert and oriented x4. His thought process is logical and speech is clear and coherent. He has good eye contact. His mood is anxious and affect is congruent. He appears casually dressed.  Today, he denies suicidal, and homicidal ideations. He reports a history of bipolar and states that he was prescribed Lamictal 25 mg twice  daily and gabapentin 300 mg 3 times daily. He states that he has been off his medications for over a month. He states that he was following up with Dr. Rowe Robert at [facility] but has not followed up in a long time. He denies current depressive or manic symptoms at this time. He states that he is mostly mostly experiencing withdrawal symptoms from alcohol. He describes his withdrawal symptoms as headache and rates his pain 6 out of 10 with 10 being the worst, nausea, chills, and hand tremors. He is noted to have coarse hand tremors. He denies auditory and visual hallucinations. There is no objective evidence that he is responding to internal or external stimuli. He reports fair sleep last night. He reports a poor appetite due to nausea.   He report drinking every day for the last couple months.  He reports that he last drank alcohol last night around 7 PM.  He reports drinking a case of beer daily on average.  He reports that the last time he received detox was about 3 to 4 months ago at a facility and [facility]. He denies a history of alcohol withdrawal seizures. He states that he is unsure as to whether or not he has experienced delirium tremens in the past but recalls experiencing severe shakes, seeing shadows and thinking that people were in trees over 10 years ago. He states that he was detoxed at that time at [facility]  He states that he was not admitted to the ICU at that time. He denies using illicit drugs.  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 He states that he is currently on probation and has an upcoming parole visit on Monday, 05/01/21.  He states that he has an upcoming court date on January 6th.  Diagnosis:  Final diagnoses:  Alcohol-induced mood disorder with depressive symptoms (HCC)  Alcohol use disorder, severe, dependence ([place])    Total Time spent with patient: 15 minutes  Past Psychiatric History: past  psychiatric history significant for alcohol use disorder, MDD, substance-induced psychotic disorder, cocaine abuse, mild THC abuse, and benzodiazepine abuse  Past Medical History:  Past Medical History:  Diagnosis Date   Asthma    Cocaine abuse ([place])    in past   Crohn disease ([place]) 2008   Depression    in past, not current as of 2019    ETOH abuse    history of alcohol use   GERD (gastroesophageal reflux disease)     Past Surgical History:  Procedure Laterality Date   BIOPSY  10/15/2017   Procedure: BIOPSY;  Surgeon: Danie Binder, MD;  Location: [facility];  Service: Endoscopy;;  terminal ileum colon   COLONOSCOPY WITH PROPOFOL N/A 10/15/2017   redundant colon, normal rectum, external and internal hemorrhoids, quiescent crohn's. Pathology: inactive chronicn nonspecific ileitis   ESOPHAGOGASTRODUODENOSCOPY (EGD) WITH PROPOFOL N/A 10/15/2017   normal esophagus, small hiatal hernia, gastritis, duodenitis   FINGER AMPUTATION Right 2012   cut finger table saw and surgically amputated   Family History:  Family History  Problem Relation Age of Onset   Stomach cancer Maternal Grandfather    Colon cancer Neg Hx    Family Psychiatric  History:  Social History:  Social History   Substance and Sexual Activity  Alcohol Use Not Currently   Comment: alcoholic- last use [id number]     Social History   Substance and Sexual Activity  Drug Use Not Currently   Types: Cocaine, Marijuana, Methamphetamines   Comment: last cocaine use 10/10/20.  last meth april 2022    Social History   Socioeconomic History   Marital status: Single    Spouse name: Not on file   Number of children: Not on file   Years of education: Not on file   Highest education level: Not on file  Occupational History   Not on file  Tobacco Use   Smoking status: Every Day    Packs/day: 0.50    Years: 15.00    Pack years: 7.50    Types: Cigarettes   Smokeless tobacco: Never   Tobacco comments:    1/4 to 1/2 pack  per day   Vaping Use   Vaping Use: Never used  Substance and Sexual Activity   Alcohol use: Not Currently    Comment: alcoholic- last use [id number]   Drug use: Not Currently    Types: Cocaine, Marijuana, Methamphetamines    Comment: last cocaine use 10/10/20.  last meth april 2022   Sexual activity: Not on file  Other Topics Concern   Not on file  Social History Narrative   Not on file   Social Determinants of Health   Financial Resource Strain: Not on file  Food Insecurity: Not on file  Transportation Needs: Not on file  Physical Activity: Not on file  Stress: Not on file  Social Connections: Not on file   SDOH:  SDOH Screenings   Alcohol Screen: Not on file  Depression (PHQ2-9): Medium Risk   PHQ-2 Score: 19  Financial Resource Strain: Not  on file  Food Insecurity: Not on file  Housing: Not on file  Physical Activity: Not on file  Social Connections: Not on file  Stress: Not on file  Tobacco Use: High Risk   Smoking Tobacco Use: Every Day   Smokeless Tobacco Use: Never   Passive Exposure: Not on file  Transportation Needs: Not on file   Additional Social History:    Pain Medications: See MAR Prescriptions: See MAR Over the Counter: See MAR History of alcohol / drug use?: Yes Longest period of sobriety (when/how long): 2 years sober approximately 10 years ago Negative Consequences of Use: Personal relationships, Legal, Financial Withdrawal Symptoms: Agitation, Sweats, Weakness, Nausea / Vomiting, Irritability, Fever / Chills, DTs, Patient aware of relationship between substance abuse and physical/medical complications, Tremors Name of Substance 1: EtOH 1 - Age of First Use: Unknown 1 - Amount (size/oz): 24 12-ounce beers 1 - Frequency: Daily 1 - Duration: Unknown 1 - Last Use / Amount: Today 1 - Method of Aquiring: Purchase 1- Route of Use: Oral    Current Medications:  Current Facility-Administered Medications  Medication Dose Route Frequency Provider  Last Rate Last Admin   acetaminophen (TYLENOL) tablet 650 mg  650 mg Oral Q6H PRN Prescilla Sours, PA-C   650 mg at 04/27/21 0121   albuterol (VENTOLIN HFA) 108 (90 Base) MCG/ACT inhaler 2 puff  2 puff Inhalation Q6H PRN Prescilla Sours, PA-C       alum & mag hydroxide-simeth (MAALOX/MYLANTA) 200-200-20 MG/5ML suspension 30 mL  30 mL Oral Q4H PRN Margorie John W, PA-C   30 mL at 04/27/21 [id number]   gabapentin (NEURONTIN) capsule 300 mg  300 mg Oral TID Prescilla Sours, PA-C   300 mg at 04/27/21 0901   hydrOXYzine (ATARAX) tablet 25 mg  25 mg Oral Q6H PRN Margorie John W, PA-C       loperamide (IMODIUM) capsule 2-4 mg  2-4 mg Oral PRN Margorie John W, PA-C       LORazepam (ATIVAN) tablet 1 mg  1 mg Oral Q6H PRN Prescilla Sours, PA-C   1 mg at 04/27/21 0122   LORazepam (ATIVAN) tablet 1 mg  1 mg Oral QID Margorie John W, PA-C   1 mg at 04/27/21 [id number]   Followed by   Derrill Memo ON 04/28/2021] LORazepam (ATIVAN) tablet 1 mg  1 mg Oral TID Prescilla Sours, PA-C       Followed by   Derrill Memo ON 04/29/2021] LORazepam (ATIVAN) tablet 1 mg  1 mg Oral BID Prescilla Sours, PA-C       Followed by   Derrill Memo ON 05/01/2021] LORazepam (ATIVAN) tablet 1 mg  1 mg Oral Daily Lovena Le, Cody W, PA-C       magnesium hydroxide (MILK OF MAGNESIA) suspension 30 mL  30 mL Oral Daily PRN Margorie John W, PA-C       multivitamin with minerals tablet 1 tablet  1 tablet Oral Daily Margorie John W, PA-C   1 tablet at 04/27/21 0901   nicotine (NICODERM CQ - dosed in mg/24 hours) patch 21 mg  21 mg Transdermal Daily Margorie John W, PA-C   21 mg at 04/27/21 0904   ondansetron (ZOFRAN-ODT) disintegrating tablet 4 mg  4 mg Oral Q6H PRN Prescilla Sours, PA-C   4 mg at 04/27/21 0827   [START ON 04/28/2021] thiamine tablet 100 mg  100 mg Oral Daily Prescilla Sours, PA-C       Current Outpatient Medications  Medication  Sig Dispense Refill   acetaminophen (TYLENOL) 500 MG tablet Take 1,000 mg by mouth every 6 (six) hours as needed for moderate pain or headache.      albuterol (VENTOLIN HFA) 108 (90 Base) MCG/ACT inhaler Inhale 2 puffs into the lungs every 6 (six) hours as needed for wheezing or shortness of breath. 8 g 2   gabapentin (NEURONTIN) 300 MG capsule Take 300 mg by mouth 3 (three) times daily.     Glucos-Chond-Hyal Ac-Ca Fructo (MOVE FREE JOINT HEALTH ADVANCE) TABS Take by mouth.     ibuprofen (ADVIL) 200 MG tablet Take 400 mg by mouth every 6 (six) hours as needed for headache or moderate pain.     lamoTRIgine (LAMICTAL) 25 MG tablet Take 25 mg by mouth 2 (two) times daily.     Multiple Vitamin (MULTIVITAMIN WITH MINERALS) TABS tablet Take 1 tablet by mouth daily. 30 tablet 3   omeprazole (PRILOSEC) 20 MG capsule Take 20 mg by mouth daily as needed (For heartburn or indigestion).     loratadine (CLARITIN) 10 MG tablet Take 10 mg by mouth daily as needed for allergies.      Labs  Lab Results:  Admission on 04/26/2021  Component Date Value Ref Range Status   SARS Coronavirus 2 by RT PCR 04/27/2021 NEGATIVE  NEGATIVE Final   Comment: (NOTE) SARS-CoV-2 target nucleic acids are NOT DETECTED.  The SARS-CoV-2 RNA is generally detectable in upper respiratory specimens during the acute phase of infection. The lowest concentration of SARS-CoV-2 viral copies this assay can detect is 138 copies/mL. A negative result does not preclude SARS-Cov-2 infection and should not be used as the sole basis for treatment or other patient management decisions. A negative result may occur with  improper specimen collection/handling, submission of specimen other than nasopharyngeal swab, presence of viral mutation(s) within the areas targeted by this assay, and inadequate number of viral copies(<138 copies/mL). A negative result must be combined with clinical observations, patient history, and epidemiological information. The expected result is Negative.  Fact Sheet for Patients:  BloggerCourse.com  Fact Sheet for Healthcare  Providers:  SeriousBroker.it  This test is no                          t yet approved or cleared by the [place] FDA and  has been authorized for detection and/or diagnosis of SARS-CoV-2 by FDA under an Emergency Use Authorization (EUA). This EUA will remain  in effect (meaning this test can be used) for the duration of the COVID-19 declaration under Section 564(b)(1) of the Act, 21 U.S.C.section 360bbb-3(b)(1), unless the authorization is terminated  or revoked sooner.       Influenza A by PCR 04/27/2021 NEGATIVE  NEGATIVE Final   Influenza B by PCR 04/27/2021 NEGATIVE  NEGATIVE Final   Comment: (NOTE) The Xpert Xpress SARS-CoV-2/FLU/RSV plus assay is intended as an aid in the diagnosis of influenza from Nasopharyngeal swab specimens and should not be used as a sole basis for treatment. Nasal washings and aspirates are unacceptable for Xpert Xpress SARS-CoV-2/FLU/RSV testing.  Fact Sheet for Patients: BloggerCourse.com  Fact Sheet for Healthcare Providers: SeriousBroker.it  This test is not yet approved or cleared by the [place] FDA and has been authorized for detection and/or diagnosis of SARS-CoV-2 by FDA under an Emergency Use Authorization (EUA). This EUA will remain in effect (meaning this test can be used) for the duration of the COVID-19 declaration under Section 564(b)(1) of the  Act, 21 U.S.C. section 360bbb-3(b)(1), unless the authorization is terminated or revoked.  Performed at [facility], [place] [address], [place], [place] [address]    SARS Coronavirus 2 Ag 04/27/2021 Negative  Negative Preliminary   WBC 04/27/2021 6.2  4.0 - 10.5 K/uL Final   RBC 04/27/2021 5.19  4.22 - 5.81 MIL/uL Final   Hemoglobin 04/27/2021 15.9  13.0 - 17.0 g/dL Final   HCT 04/27/2021 45.5  39.0 - 52.0 % Final   MCV 04/27/2021 87.7  80.0 - 100.0 fL Final   MCH 04/27/2021 30.6  26.0 - 34.0 pg  Final   MCHC 04/27/2021 34.9  30.0 - 36.0 g/dL Final   RDW 04/27/2021 13.6  11.5 - 15.5 % Final   Platelets 04/27/2021 269  150 - 400 K/uL Final   nRBC 04/27/2021 0.0  0.0 - 0.2 % Final   Neutrophils Relative % 04/27/2021 34  % Final   Neutro Abs 04/27/2021 2.1  1.7 - 7.7 K/uL Final   Lymphocytes Relative 04/27/2021 55  % Final   Lymphs Abs 04/27/2021 3.4  0.7 - 4.0 K/uL Final   Monocytes Relative 04/27/2021 7  % Final   Monocytes Absolute 04/27/2021 0.4  0.1 - 1.0 K/uL Final   Eosinophils Relative 04/27/2021 3  % Final   Eosinophils Absolute 04/27/2021 0.2  0.0 - 0.5 K/uL Final   Basophils Relative 04/27/2021 1  % Final   Basophils Absolute 04/27/2021 0.0  0.0 - 0.1 K/uL Final   Immature Granulocytes 04/27/2021 0  % Final   Abs Immature Granulocytes 04/27/2021 0.01  0.00 - 0.07 K/uL Final   Performed at [facility], [place] [address], [place], [place] [address]   Sodium 04/27/2021 138  135 - 145 mmol/L Final   Potassium 04/27/2021 4.2  3.5 - 5.1 mmol/L Final   Chloride 04/27/2021 105  98 - 111 mmol/L Final   CO2 04/27/2021 24  22 - 32 mmol/L Final   Glucose, Bld 04/27/2021 89  70 - 99 mg/dL Final   Glucose reference range applies only to samples taken after fasting for at least 8 hours.   BUN 04/27/2021 13  6 - 20 mg/dL Final   Creatinine, Ser 04/27/2021 0.73  0.61 - 1.24 mg/dL Final   Calcium 04/27/2021 9.5  8.9 - 10.3 mg/dL Final   Total Protein 04/27/2021 6.6  6.5 - 8.1 g/dL Final   Albumin 04/27/2021 4.0  3.5 - 5.0 g/dL Final   AST 04/27/2021 38  15 - 41 U/L Final   ALT 04/27/2021 23  0 - 44 U/L Final   Alkaline Phosphatase 04/27/2021 89  38 - 126 U/L Final   Total Bilirubin 04/27/2021 0.8  0.3 - 1.2 mg/dL Final   GFR, Estimated 04/27/2021 >60  >60 mL/min Final   Comment: (NOTE) Calculated using the CKD-EPI Creatinine Equation (2021)    Anion gap 04/27/2021 9  5 - 15 Final   Performed at [facility] [address] [place], [place] [address]   Hgb A1c MFr Bld  04/27/2021 5.7 (H)  4.8 - 5.6 % Final   Comment: (NOTE) Pre diabetes:          5.7%-6.4%  Diabetes:              >6.4%  Glycemic control for   <7.0% adults with diabetes    Mean Plasma Glucose 04/27/2021 116.89  mg/dL Final   Performed at [facility], [place] [address] [place], [place] [address]  Alcohol, Ethyl (B) 04/27/2021 251 (H)  <10 mg/dL Final   Comment: (NOTE) Lowest detectable limit for serum alcohol is 10 mg/dL.  For medical purposes only. Performed at [facility], [place] [address] [place], [place] [address]    Cholesterol 04/27/2021 209 (H)  0 - 200 mg/dL Final   Triglycerides 04/27/2021 615 (H)  <150 mg/dL Final   HDL 04/27/2021 66  >40 mg/dL Final   Total CHOL/HDL Ratio 04/27/2021 3.2  RATIO Final   VLDL 04/27/2021 UNABLE TO CALCULATE IF TRIGLYCERIDE OVER 400 mg/dL  0 - 40 mg/dL Final   LDL Cholesterol 04/27/2021 UNABLE TO CALCULATE IF TRIGLYCERIDE OVER 400 mg/dL  0 - 99 mg/dL Final   Comment:        Total Cholesterol/HDL:CHD Risk Coronary Heart Disease Risk Table                     Men   Women  1/2 Average Risk   3.4   3.3  Average Risk       5.0   4.4  2 X Average Risk   9.6   7.1  3 X Average Risk  23.4   11.0        Use the calculated Patient Ratio above and the CHD Risk Table to determine the patient's CHD Risk.        ATP III CLASSIFICATION (LDL):  <100     mg/dL   Optimal  100-129  mg/dL   Near or Above                    Optimal  130-159  mg/dL   Borderline  160-189  mg/dL   High  >190     mg/dL   Very High Performed at [facility] [address], [place],  [address]    TSH 04/27/2021 2.502  0.350 - 4.500 uIU/mL Final   Comment: Performed by a 3rd Generation assay with a functional sensitivity of <=0.01 uIU/mL. Performed at [facility], [place] [address], [place], [place] [address]    POC Amphetamine UR 04/27/2021 None Detected  NONE DETECTED (Cut Off Level 1000 ng/mL) Final   POC Secobarbital (BAR) 04/27/2021  None Detected  NONE DETECTED (Cut Off Level 300 ng/mL) Final   POC Buprenorphine (BUP) 04/27/2021 None Detected  NONE DETECTED (Cut Off Level 10 ng/mL) Final   POC Oxazepam (BZO) 04/27/2021 None Detected  NONE DETECTED (Cut Off Level 300 ng/mL) Final   POC Cocaine UR 04/27/2021 None Detected  NONE DETECTED (Cut Off Level 300 ng/mL) Final   POC Methamphetamine UR 04/27/2021 None Detected  NONE DETECTED (Cut Off Level 1000 ng/mL) Final   POC Morphine 04/27/2021 None Detected  NONE DETECTED (Cut Off Level 300 ng/mL) Final   POC Oxycodone UR 04/27/2021 None Detected  NONE DETECTED (Cut Off Level 100 ng/mL) Final   POC Methadone UR 04/27/2021 None Detected  NONE DETECTED (Cut Off Level 300 ng/mL) Final   POC Marijuana UR 04/27/2021 None Detected  NONE DETECTED (Cut Off Level 50 ng/mL) Final   SARSCOV2ONAVIRUS 2 AG 04/27/2021 NEGATIVE  NEGATIVE Final   Comment: (NOTE) SARS-CoV-2 antigen NOT DETECTED.   Negative results are presumptive.  Negative results do not preclude SARS-CoV-2 infection and should not be used as the sole basis for treatment or other patient management decisions, including infection  control decisions, particularly in the presence of clinical signs and  symptoms consistent with COVID-19, or in those who  have been in contact with the virus.  Negative results must be combined with clinical observations, patient history, and epidemiological information. The expected result is Negative.  Fact Sheet for Patients: HandmadeRecipes.com.cy  Fact Sheet for Healthcare Providers: FuneralLife.at  This test is not yet approved or cleared by the [place] FDA and  has been authorized for detection and/or diagnosis of SARS-CoV-2 by FDA under an Emergency Use Authorization (EUA).  This EUA will remain in effect (meaning this test can be used) for the duration of  the COV                          ID-19 declaration under Section 564(b)(1) of  the Act, 21 U.S.C. section 360bbb-3(b)(1), unless the authorization is terminated or revoked sooner.     Direct LDL 04/27/2021 103.4 (H)  0 - 99 mg/dL Final   Performed at [facility] [address], [place], [place] [address]    Blood Alcohol level:  Lab Results  Component Value Date   ETH 251 (H) 04/27/2021   ETH 131 (H) [id number]    Metabolic Disorder Labs: Lab Results  Component Value Date   HGBA1C 5.7 (H) 04/27/2021   MPG 116.89 04/27/2021   MPG 117 04/17/2009   No results found for: PROLACTIN Lab Results  Component Value Date   CHOL 209 (H) 04/27/2021   TRIG 615 (H) 04/27/2021   HDL 66 04/27/2021   CHOLHDL 3.2 04/27/2021   VLDL UNABLE TO CALCULATE IF TRIGLYCERIDE OVER 400 mg/dL 04/27/2021   LDLCALC UNABLE TO CALCULATE IF TRIGLYCERIDE OVER 400 mg/dL 04/27/2021   LDLCALC 88 04/19/2017    Therapeutic Lab Levels: No results found for: LITHIUM No results found for: VALPROATE No components found for:  CBMZ  Physical Findings   GAD-7    Flowsheet Row Office Visit from 06/14/2020 in [facility] Office Visit from 05/27/2020 in [facility]  Total GAD-7 Score 11 9      PHQ2-9    [facility] from 04/26/2021 in [facility] Office Visit from 06/14/2020 in [facility] Office Visit from 05/27/2020 in [facility] Nutrition from [facility]2017 [facility] from 07/23/2017 in [facility]  PHQ-2 Total Score 4 2 2  0 0  PHQ-9 Total Score 19 15 11  -- --      Flowsheet Row [facility] in [facility] from 10/02/2020 in [facility] ED to Hosp-Admission (Discharged) from 07/28/2020 in [facility] High Risk No Risk High Risk         Musculoskeletal  Strength & Muscle Tone: within normal limits Gait & Station: normal Patient leans: N/A  Psychiatric Specialty Exam  Presentation  General Appearance: Appropriate for Environment  Eye Contact:Fair  Speech:Clear and Coherent  Speech Volume:Normal  Handedness:No data recorded  Mood and Affect  Mood:Anxious  Affect:Congruent   Thought Process  Thought Processes:Coherent; Goal Directed  Descriptions of Associations:Intact  Orientation:Full (Time, Place and Person)  Thought Content:Logical; WDL  Diagnosis of Schizophrenia or Schizoaffective disorder in past: No    Hallucinations:Hallucinations: None  Ideas of Reference:None  Suicidal Thoughts:Suicidal Thoughts: No  Homicidal Thoughts:Homicidal Thoughts: No   Sensorium  Memory:Immediate Fair; Recent Fair; Remote Fair  Judgment:Intact  Insight:Present   Community education officer  Concentration:Fair  Attention Span:Fair  Recall:Fair  Fund of Knowledge:Fair  Language:Fair   Psychomotor Activity  Psychomotor Activity:Psychomotor Activity: Normal   Assets  Assets:Communication Skills; Desire for Improvement; Housing; Health and safety inspector; Leisure Time; Physical Health; Resilience; Social Support; Talents/Skills   Sleep  Sleep:Sleep: Fair Number of Hours of Sleep: 6   Nutritional Assessment (For OBS and FBC admissions only) Has the patient had a weight loss or gain of 10 pounds or more in the last 3 months?: No Has the patient had a decrease in food intake/or appetite?: Yes Does the patient have dental problems?: No Does the patient have eating habits or behaviors that may be indicators of an eating disorder including binging or inducing vomiting?: No Has the patient recently lost weight without trying?: 0 Has the patient been eating poorly because of a decreased appetite?: 1 Malnutrition Screening Tool Score: 1   Physical Exam  Physical Exam Constitutional:       Appearance: Normal appearance.  HENT:     Head: Normocephalic.     Nose: Nose normal.  Eyes:     Conjunctiva/sclera: Conjunctivae normal.  Cardiovascular:     Rate and Rhythm: Normal rate.  Pulmonary:     Effort: Pulmonary effort is normal.  Musculoskeletal:        General: Normal range of motion.  Neurological:     Mental Status: He is alert and oriented to person, place, and time.     Motor: Tremor present.   Review of Systems  Constitutional:  Positive for chills.  HENT: Negative.    Eyes: Negative.   Respiratory: Negative.    Cardiovascular: Negative.   Gastrointestinal:  Positive for nausea.  Genitourinary: Negative.   Musculoskeletal: Negative.   Skin: Negative.   Neurological:  Positive for tremors.  Endo/Heme/Allergies: Negative.   Psychiatric/Behavioral:  Positive for substance abuse.   Blood pressure 121/81, pulse (!) 110, temperature 98.4 F (36.9 C), temperature source Oral, resp. rate 20, SpO2 95 %. There is no height or weight on file to calculate BMI.  Treatment Plan Summary: Patient admitted to the [facility] for substance use treatment and mood stabilization.   We will continue the following home medications at this time:             -Albuterol 108 MCG/ACT inhaler: 2 puffs inhalation every 6 hours as needed for wheezing/shortness of breath   Alcohol withdrawal symptoms, CIWA protocol initiated with the following medication orders:             -Ativan taper:                         -Ativan 1 mg p.o. times daily (first dose on 04/27/2021 at 1000) for 6 doses followed by Ativan 1 mg p.o. 3 times daily (first dose on 04/28/2021 at 2200) for 3 doses followed by Ativan 1 mg p.o. twice daily (first dose on 04/29/2021 at 2200) for 2 doses followed by Ativan 1 mg p.o. daily on 05/01/2021 at 1000 for 1 dose             -Ativan 1 mg p.o. every 6 hours as needed for CIWA score greater than 10             -Vistaril 25 mg p.o. every 6 hours as needed  for anxiety/agitation or CIWA score less than or equal to 10             -Imodium capsule 2 to 4 mg  p.o. as needed for diarrhea or loose stools             -Multivitamin with minerals tablet: 1 tablet p.o. daily for nutritional supplementation             -Zofran ODT 4 mg p.o. every 6 hours as needed for nausea/vomiting             -Thiamine tablet 100 mg p.o. daily for nutritional supplementation   -Continue home medication of Gabapentin at 300 mg PO TID for alcohol withdrawal    Labs reviewed:  Ethanol: Elevated at 251 mg/dL.     Dispo-ongoing-establish outpatient services with psychiatry   Marissa Calamity, NP 04/27/2021 10:18 AM

## 2021-04-27 NOTE — ED Notes (Signed)
Patient communicated to staff in wrap up group that he is ready to get his addiction behind him, and have a better quality of life. Staff commended patient for sharing his story and giving positive feedback.

## 2021-04-28 DIAGNOSIS — Z20822 Contact with and (suspected) exposure to covid-19: Secondary | ICD-10-CM | POA: Diagnosis not present

## 2021-04-28 DIAGNOSIS — F32A Depression, unspecified: Secondary | ICD-10-CM | POA: Diagnosis not present

## 2021-04-28 DIAGNOSIS — F1410 Cocaine abuse, uncomplicated: Secondary | ICD-10-CM | POA: Diagnosis not present

## 2021-04-28 DIAGNOSIS — F1024 Alcohol dependence with alcohol-induced mood disorder: Secondary | ICD-10-CM | POA: Diagnosis not present

## 2021-04-28 MED ORDER — GABAPENTIN 300 MG PO CAPS
300.0000 mg | ORAL_CAPSULE | Freq: Three times a day (TID) | ORAL | 0 refills | Status: DC
Start: 2021-04-28 — End: 2021-06-09

## 2021-04-28 MED ORDER — ALBUTEROL SULFATE HFA 108 (90 BASE) MCG/ACT IN AERS: 2.0000 | INHALATION_SPRAY | Freq: Four times a day (QID) | RESPIRATORY_TRACT | 0 refills | Status: DC | PRN

## 2021-04-28 MED ORDER — ADULT MULTIVITAMIN W/MINERALS CH: 1.0000 | ORAL_TABLET | Freq: Every day | ORAL | 0 refills | Status: DC

## 2021-04-28 NOTE — ED Provider Notes (Signed)
FBC/OBS ASAP Discharge Summary  Date and Time: 04/28/2021 2:20 PM  Name: Brendan Ochoa  MRN:  [id number]   Discharge Diagnoses:  Final diagnoses:  Alcohol-induced mood disorder with depressive symptoms ([place])  Alcohol use disorder, severe, dependence ([place])    Subjective:  Patient seen and chart reviewed-most recent CIWA score 2, prior to that 1.  Upon interview this afternoon patient immediately requested discharge.  He states that he is "a lot better".  He reports sleeping well and goes on to state that "I probably did not need to do detox".  Patient states that prior to coming in he felt that he had been experiencing withdrawal symptoms for a couple days and had been "tapering down" on his alcohol.  EtOH 251 on presentation.  Patient denies anxiety, headache, anorexia, nausea, vomiting.  Patient states he has a good appetite and has been eating well.  He reports some mild tremor in hands, this is noted with outsteched hands as well.  Discussed at length (as noted below) recommendation to remain in the [facility] and to continue Ativan taper which is scheduled to end on Monday 12/19.  Patient states that he does not wish to stay in the [facility] until that time and states that he has a Research officer, trade union that he has to meet on Monday and expresses that he would like to discharge today.   Discussed the risks and benefits of discharging early versus staying to complete taper at length.  After discussion, patient maintained that he feels well enough for discharge.  Patient states that he has a Engineer, manufacturing systems and has a meeting with them on Monday and raises concern that he may need to take a drug test.  Patient states that he has been talking to some friends and he was told that Ativan may show up in the drug test.  Patient states that he would like to discharge and get the Ativan out of his system in the event he is asked to take a urine drug test he will pass.  Patient is reporting improvement in withdrawal  symptoms overall and objectively withdrawal symptoms are mild at the moment.  Discussed at length  risks of leaving and not completing taper including seizure and death-patient verbalized understanding and was agreeable to sign an AMA form which was placed in chart.  Stay Summary:  45 year old male with history of substance use disorder, alcohol use disorder, MDD, self-reported bipolar disorder, cocaine abuse who presented to the [facility] on 12/14 for assistance with alcohol detox.  Patient was re-started on previously prescribed gabapentin 300 mg TID for AUD. He was started on Ativan taper and CIWA protocol and was admitted to the [facility] for further alcohol detox on 12/15.  The following day patient requested discharge and stated he feels "a lot better".  He denied SI/HI/AVH.  CIWA scores have been low-scoring 1-2 within the last 24 hours.  Discussed at length recommendation to remain in the [facility] to complete taper; however, patient is insistent on discharge.  Discussed the risks and benefits of discharging early versus staying to complete taper length.  After discussion, patient maintained that he feels well enough for discharge.  Patient states that he has a Engineer, manufacturing systems and has a meeting with them on Monday and raises concern that he may need to take a drug test.  Patient states that he has been talking to some friends and he was told that Ativan may show up in the drug test.  Patient states that he would  like to discharge and get the Ativan out of his system in the event he is asked to take a urine drug test he will pass.  Patient is reporting improvement in withdrawal symptoms overall and objectively withdrawal symptoms are mild at the moment.  Discussed at length  risks of leaving and not completing taper including seizure and death-patient verbalized understanding and was agreeable to sign an AMA form which was placed in chart.   On my interview today, day of discharge, , patient is in NAD, alert,  oriented, calm, cooperative, and attentive, with normal affect, speech, and behavior. Objectively, there is no evidence of psychosis/ mania (able to converse coherently, linear and goal directed thought, no RIS, no distractibility, not pre-occupied, no FOI, etc) nor depression to the point of suicidality (able to concentrate, affect full and reactive, speech normal r/v/t, no psychomotor retardation/agitation, etc).  Overall, patient appears to be at the point, in the absence of inhibiting or disinhibiting symptoms, where he can successfully move to lesser restrictive setting for care.     Total Time spent with patient: 30 minutes  Past Psychiatric History: AUD, MDD, cocaine abuse, self reported bioplar disorder Past Medical History:  Past Medical History:  Diagnosis Date   Asthma    Cocaine abuse (HCC)    in past   Crohn disease (HCC) 2008   Depression    in past, not current as of 2019    ETOH abuse    history of alcohol use   GERD (gastroesophageal reflux disease)     Past Surgical History:  Procedure Laterality Date   BIOPSY  10/15/2017   Procedure: BIOPSY;  Surgeon: West Bali, MD;  Location: [facility];  Service: Endoscopy;;  terminal ileum colon   COLONOSCOPY WITH PROPOFOL N/A 10/15/2017   redundant colon, normal rectum, external and internal hemorrhoids, quiescent crohn's. Pathology: inactive chronicn nonspecific ileitis   ESOPHAGOGASTRODUODENOSCOPY (EGD) WITH PROPOFOL N/A 10/15/2017   normal esophagus, small hiatal hernia, gastritis, duodenitis   FINGER AMPUTATION Right 2012   cut finger table saw and surgically amputated   Family History:  Family History  Problem Relation Age of Onset   Stomach cancer Maternal Grandfather    Colon cancer Neg Hx    Family Psychiatric History:  Maternal grandfather-bipolar disorder "Rampant substance use" throughout the family No history of attempted or completed suicides in the family to his knowledge Social History:  Social  History   Substance and Sexual Activity  Alcohol Use Not Currently   Comment: alcoholic- last use 11/10/20     Social History   Substance and Sexual Activity  Drug Use Not Currently   Types: Cocaine, Marijuana, Methamphetamines   Comment: last cocaine use 10/10/20.  last meth april 2022    Social History   Socioeconomic History   Marital status: Single    Spouse name: Not on file   Number of children: Not on file   Years of education: Not on file   Highest education level: Not on file  Occupational History   Not on file  Tobacco Use   Smoking status: Every Day    Packs/day: 0.50    Years: 15.00    Pack years: 7.50    Types: Cigarettes   Smokeless tobacco: Never   Tobacco comments:    1/4 to 1/2 pack per day   Vaping Use   Vaping Use: Never used  Substance and Sexual Activity   Alcohol use: Not Currently    Comment: alcoholic- last use 11/10/20  Drug use: Not Currently    Types: Cocaine, Marijuana, Methamphetamines    Comment: last cocaine use 10/10/20.  last meth april 2022   Sexual activity: Not on file  Other Topics Concern   Not on file  Social History Narrative   Not on file   Social Determinants of Health   Financial Resource Strain: Not on file  Food Insecurity: Not on file  Transportation Needs: Not on file  Physical Activity: Not on file  Stress: Not on file  Social Connections: Not on file   SDOH:  SDOH Screenings   Alcohol Screen: Not on file  Depression (PHQ2-9): Medium Risk   PHQ-2 Score: 19  Financial Resource Strain: Not on file  Food Insecurity: Not on file  Housing: Not on file  Physical Activity: Not on file  Social Connections: Not on file  Stress: Not on file  Tobacco Use: High Risk   Smoking Tobacco Use: Every Day   Smokeless Tobacco Use: Never   Passive Exposure: Not on file  Transportation Needs: Not on file    Tobacco Cessation:  A prescription for an FDA-approved tobacco cessation medication was offered at discharge and  the patient refused  Current Medications:  Current Facility-Administered Medications  Medication Dose Route Frequency Provider Last Rate Last Admin   acetaminophen (TYLENOL) tablet 650 mg  650 mg Oral Q6H PRN Prescilla Sours, PA-C   650 mg at 04/27/21 0121   albuterol (VENTOLIN HFA) 108 (90 Base) MCG/ACT inhaler 2 puff  2 puff Inhalation Q6H PRN Prescilla Sours, PA-C       alum & mag hydroxide-simeth (MAALOX/MYLANTA) 200-200-20 MG/5ML suspension 30 mL  30 mL Oral Q4H PRN Margorie John W, PA-C   30 mL at 04/27/21 2115   gabapentin (NEURONTIN) capsule 300 mg  300 mg Oral TID Prescilla Sours, PA-C   300 mg at 04/28/21 [id number]   hydrOXYzine (ATARAX) tablet 25 mg  25 mg Oral Q6H PRN Margorie John W, PA-C       loperamide (IMODIUM) capsule 2-4 mg  2-4 mg Oral PRN Margorie John W, PA-C       LORazepam (ATIVAN) tablet 1 mg  1 mg Oral Q6H PRN Prescilla Sours, PA-C   1 mg at 04/27/21 0122   LORazepam (ATIVAN) tablet 1 mg  1 mg Oral TID Prescilla Sours, PA-C       Followed by   Derrill Memo ON 04/29/2021] LORazepam (ATIVAN) tablet 1 mg  1 mg Oral BID Margorie John W, PA-C       Followed by   Derrill Memo ON 05/01/2021] LORazepam (ATIVAN) tablet 1 mg  1 mg Oral Daily Lovena Le, Cody W, PA-C       magnesium hydroxide (MILK OF MAGNESIA) suspension 30 mL  30 mL Oral Daily PRN Margorie John W, PA-C       multivitamin with minerals tablet 1 tablet  1 tablet Oral Daily Margorie John W, PA-C   1 tablet at 04/28/21 0914   nicotine (NICODERM CQ - dosed in mg/24 hours) patch 21 mg  21 mg Transdermal Daily Margorie John W, PA-C   21 mg at 04/28/21 0914   ondansetron (ZOFRAN-ODT) disintegrating tablet 4 mg  4 mg Oral Q6H PRN Prescilla Sours, PA-C   4 mg at 04/27/21 0827   thiamine tablet 100 mg  100 mg Oral Daily Margorie John W, PA-C   100 mg at 04/28/21 [id number]   Current Outpatient Medications  Medication Sig Dispense Refill   albuterol (VENTOLIN  HFA) 108 (90 Base) MCG/ACT inhaler Inhale 2 puffs into the lungs every 6 (six) hours as needed for  wheezing or shortness of breath. 8 g 0   gabapentin (NEURONTIN) 300 MG capsule Take 1 capsule (300 mg total) by mouth 3 (three) times daily. 90 capsule 0   Multiple Vitamin (MULTIVITAMIN WITH MINERALS) TABS tablet Take 1 tablet by mouth daily. 30 tablet 0    PTA Medications: (Not in a hospital admission)   Musculoskeletal  Strength & Muscle Tone: within normal limits Gait & Station: normal Patient leans: N/A  Psychiatric Specialty Exam  Presentation  General Appearance: Appropriate for Environment; Casual  Eye Contact:Good  Speech:Clear and Coherent; Normal Rate  Speech Volume:Normal  Handedness:No data recorded  Mood and Affect  Mood:Euthymic  Affect:Appropriate; Congruent   Thought Process  Thought Processes:Coherent; Goal Directed; Linear  Descriptions of Associations:Intact  Orientation:Full (Time, Place and Person)  Thought Content:Logical; WDL; Perseveration; Other (comment) (perseverating about need to discharge)  Diagnosis of Schizophrenia or Schizoaffective disorder in past: No    Hallucinations:Hallucinations: None  Ideas of Reference:None  Suicidal Thoughts:Suicidal Thoughts: No  Homicidal Thoughts:Homicidal Thoughts: No   Sensorium  Memory:Immediate Good; Recent Good; Remote Good  Judgment:Fair  Insight:Present   Executive Functions  Concentration:Fair  Attention Span:Good  [place] of Knowledge:Good  Language:Good   Psychomotor Activity  Psychomotor Activity:Psychomotor Activity: Tremor (mild tremor)   Assets  Assets:Communication Skills; Desire for Improvement; Physical Health; Social Support; Housing; Talents/Skills   Sleep  Sleep:Sleep: Fair Number of Hours of Sleep: 6   Nutritional Assessment (For OBS and FBC admissions only) Has the patient had a weight loss or gain of 10 pounds or more in the last 3 months?: No Has the patient had a decrease in food intake/or appetite?: Yes Does the patient have dental  problems?: No Does the patient have eating habits or behaviors that may be indicators of an eating disorder including binging or inducing vomiting?: No Has the patient recently lost weight without trying?: 0 Has the patient been eating poorly because of a decreased appetite?: 1 Malnutrition Screening Tool Score: 1   Physical Exam  Physical Exam Constitutional:      Appearance: Normal appearance. He is normal weight.  HENT:     Head: Normocephalic and atraumatic.  Eyes:     Extraocular Movements: Extraocular movements intact.  Pulmonary:     Effort: Pulmonary effort is normal.  Musculoskeletal:        General: Deformity present.     Comments: Right ring finger amputation  Neurological:     General: No focal deficit present.     Mental Status: He is alert and oriented to person, place, and time.     Comments: mild tremor noted in BUE with outstretched arms   Psychiatric:        Attention and Perception: Attention and perception normal.        Speech: Speech normal.        Behavior: Behavior normal. Behavior is cooperative.        Thought Content: Thought content normal.   Review of Systems  Constitutional:  Negative for chills and fever.  HENT:  Negative for hearing loss.   Eyes:  Negative for discharge and redness.  Respiratory:  Negative for cough.   Cardiovascular:  Negative for chest pain.  Gastrointestinal:  Negative for abdominal pain.  Musculoskeletal:  Negative for myalgias.  Neurological:  Positive for tremors. Negative for seizures and headaches.       Tremor (  improved) in bilateral outstretched hands   Psychiatric/Behavioral:  Positive for substance abuse. Negative for depression, hallucinations and suicidal ideas.   Blood pressure 126/87, pulse 96, temperature (!) 97.5 F (36.4 C), temperature source Oral, resp. rate 18, SpO2 99 %. There is no height or weight on file to calculate BMI.  Demographic Factors:  Male, Caucasian, Low socioeconomic status, and  Access to firearms  Loss Factors: Legal issues  Historical Factors: Family history of mental illness or substance abuse  Risk Reduction Factors:   Sense of responsibility to family, Employed, Living with another person, especially a relative, Positive social support, and Positive coping skills or problem solving skills  Continued Clinical Symptoms:  Depression:   Comorbid alcohol abuse/dependence Alcohol/Substance Abuse/Dependencies  Cognitive Features That Contribute To Risk:  Thought constriction (tunnel vision)    Suicide Risk:  Minimal: No identifiable suicidal ideation.  Patients presenting with no risk factors but with morbid ruminations; may be classified as minimal risk based on the severity of the depressive symptoms  Plan Of Care/Follow-up recommendations:  Activity:  as tolerated Diet:  regular Other:     Take all medications as prescribed by his/her mental healthcare provider. Report any adverse effects and or reactions from the medicines to your outpatient provider promptly. Do not engage in alcohol and or illegal drug use while on prescription medicines. In the event of worsening symptoms, call the crisis hotline, 911 and or go to the nearest ED for appropriate evaluation and treatment of symptoms. follow-up with your primary care provider for your other medical issues, concerns and or health care needs.    Please come to [facility] (this facility) during walk in hours for appointment with psychiatrist for further medication management and for therapists for therapy.    Walk in hours are 8-11 AM Monday through Thursday for medication management. Therapy walk in hours are Monday-Wednesday 8 AM-1PM.   It is first come, first -serve; it is best to arrive by 7:00 AM.   On Friday from 1 pm to 4 pm for therapy intake only. Please arrive by 12:00 pm as it is  first come, first -serve.    When you arrive please go upstairs for your  appointment. If you are unsure of where to go, inform the front desk that you are here for a walk in appointment and they will assist you with directions upstairs.  Address:  [address] in [place], [place] Ph: [phone]     Allergies as of 04/28/2021   No Known Allergies      Medication List     STOP taking these medications    acetaminophen 500 MG tablet Commonly known as: TYLENOL   ibuprofen 200 MG tablet Commonly known as: ADVIL   lamoTRIgine 25 MG tablet Commonly known as: LAMICTAL   loratadine 10 MG tablet Commonly known as: Psychologist, forensic Advance Tabs   omeprazole 20 MG capsule Commonly known as: PRILOSEC       TAKE these medications    albuterol 108 (90 Base) MCG/ACT inhaler Commonly known as: VENTOLIN HFA Inhale 2 puffs into the lungs every 6 (six) hours as needed for wheezing or shortness of breath.   gabapentin 300 MG capsule Commonly known as: NEURONTIN Take 1 capsule (300 mg total) by mouth 3 (three) times daily.   multivitamin with minerals Tabs tablet Take 1 tablet by mouth daily.       Patient provided with 7-day samples of albuterol, gabapentin,  multivitamin.  30-day printed prescriptions were provided at discharge.  Disposition: self care  Ival Bible, MD 04/28/2021, 2:20 PM

## 2021-04-28 NOTE — Discharge Instructions (Signed)
Take all medications as prescribed by his/her mental healthcare provider. °Report any adverse effects and or reactions from the medicines to your outpatient provider promptly. °Do not engage in alcohol and or illegal drug use while on prescription medicines. °In the event of worsening symptoms, call the crisis hotline, 911 and or go to the nearest ED for appropriate evaluation and treatment of symptoms. °follow-up with your primary care provider for your other medical issues, concerns and or health care needs. ° ° ° ° °Please come to [name] (this facility) during walk in hours for appointment with psychiatrist for further medication management and for therapists for therapy.  ° ° Walk in hours are 8-11 AM Monday through Thursday for medication management. Therapy walk in hours are Monday-Wednesday 8 AM-1PM.   It is first come, first -serve; it is best to arrive by 7:00 AM.  ° °On Friday from 1 pm to 4 pm for therapy intake only. Please arrive by 12:00 pm as it is  first come, first -serve.   ° °When you arrive please go upstairs for your appointment. If you are unsure of where to go, inform the front desk that you are here for a walk in appointment and they will assist you with directions upstairs. ° °Address:  °[address], in [place], [address] °Ph: [phone]  ° °

## 2021-04-28 NOTE — Progress Notes (Signed)
Patient in dining room eating breakfast. Denied SI, HI, AVH.  Reported feeling better than he thought that he would.  Stated his withdrawals aren't as bad and he currently only has tremors.  Patient stated that he's staying at the [facility] and hopes to discharge back there tomorrow.  Denied any other complaints. Continue to monitor for safety.

## 2021-04-28 NOTE — Progress Notes (Signed)
AVS, including RX/samples and follow up appointments and resources, reviewed with patient.  All questions answered.  Patient verbalized understanding of information presented.  Patient denied SI, HI, AVH.  Stated he felt safe and ready for discharge. All belongings returned and belongings sheet signed.  Patient ambulated independently to sally port without issue.  Patient discharged to [facility] by safe transport.  Patient discharged in stable condition; no acute distress noted.

## 2021-04-28 NOTE — Group Note (Signed)
Group Topic: Relapse and Recovery  Group Date: 04/28/2021 Start Time: 1040 End Time: 1120 Facilitators: Levander Campion  Department: [facility]  Number of Participants: 1  Group Focus: relapse prevention Treatment Modality:  Behavior Modification Therapy Interventions utilized were patient education Purpose: enhance coping skills, relapse prevention strategies, and trigger / craving management  Name: Brendan Ochoa Date of Birth: 1975-07-31  MR: [id number]    Level of Participation: active Quality of Participation: attentive and cooperative Interactions with others: gave feedback Mood/Affect: appropriate Triggers (if applicable): n/a Cognition: coherent/clear Progress: Moderate Response: n/a Plan: follow-up needed  Patients Problems:  Patient Active Problem List   Diagnosis Date Noted   Alcohol abuse with withdrawal (HCC) 04/27/2021   Acute encephalopathy    Hypokalemia    Tobacco abuse    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

## 2021-04-28 NOTE — ED Notes (Signed)
Pt asleep in bed. Respirations even and unlabored. Will continue to monitor for safety. ?

## 2021-04-28 NOTE — Progress Notes (Signed)
Patient in dining room eating lunch.  Stated he's feeling much better and is hoping to dc home tomorrow.  Continue to monitor for safety.

## 2021-04-28 NOTE — Progress Notes (Signed)
Patient resting in bed with eyes closed.  Respirations even and unlabored.  Continue to monitor for safety.

## 2021-05-08 ENCOUNTER — Telehealth (HOSPITAL_COMMUNITY): Payer: Self-pay

## 2021-05-08 NOTE — BH Assessment (Signed)
Care Management - FBC Follow Up Discharges   Writer attempted to make contact with patient today and was unsuccessful.  Writer left a HIPPA compliant voice message.   Per chart review, patient was discharged and went back to the [facility].  Pt was also provided with mental health and substance abuse outpatient resources.

## 2021-06-08 ENCOUNTER — Other Ambulatory Visit (HOSPITAL_COMMUNITY)
Admission: EM | Admit: 2021-06-08 | Discharge: 2021-06-10 | Disposition: A | Discharge status: Home / Self Care | Payer: No Payment, Other | Attending: Family | Admitting: Family

## 2021-06-08 DIAGNOSIS — F1410 Cocaine abuse, uncomplicated: Secondary | ICD-10-CM | POA: Insufficient documentation

## 2021-06-08 DIAGNOSIS — Z20822 Contact with and (suspected) exposure to covid-19: Secondary | ICD-10-CM | POA: Insufficient documentation

## 2021-06-08 DIAGNOSIS — F1020 Alcohol dependence, uncomplicated: Secondary | ICD-10-CM | POA: Diagnosis present

## 2021-06-08 DIAGNOSIS — F1010 Alcohol abuse, uncomplicated: Secondary | ICD-10-CM | POA: Diagnosis not present

## 2021-06-08 DIAGNOSIS — F1420 Cocaine dependence, uncomplicated: Secondary | ICD-10-CM | POA: Insufficient documentation

## 2021-06-08 DIAGNOSIS — F1220 Cannabis dependence, uncomplicated: Secondary | ICD-10-CM | POA: Insufficient documentation

## 2021-06-08 LAB — COMPREHENSIVE METABOLIC PANEL
ALT: 23 U/L (ref 0–44)
AST: 31 U/L (ref 15–41)
Albumin: 4.2 g/dL (ref 3.5–5.0)
Alkaline Phosphatase: 63 U/L (ref 38–126)
Anion gap: 16 — ABNORMAL HIGH (ref 5–15)
BUN: 13 mg/dL (ref 6–20)
CO2: 21 mmol/L — ABNORMAL LOW (ref 22–32)
Calcium: 9 mg/dL (ref 8.9–10.3)
Chloride: 101 mmol/L (ref 98–111)
Creatinine, Ser: 0.81 mg/dL (ref 0.61–1.24)
GFR, Estimated: >60 mL/min (ref >60)
Glucose, Bld: 99 mg/dL (ref 70–99)
Potassium: 3.6 mmol/L (ref 3.5–5.1)
Sodium: 138 mmol/L (ref 135–145)
Total Bilirubin: 0.8 mg/dL (ref 0.3–1.2)
Total Protein: 7 g/dL (ref 6.5–8.1)

## 2021-06-08 LAB — POCT URINE DRUG SCREEN - MANUAL ENTRY (I-SCREEN)
POC Amphetamine UR: NOT DETECTED
POC Buprenorphine (BUP): NOT DETECTED
POC Cocaine UR: POSITIVE — AB
POC Marijuana UR: POSITIVE — AB
POC Methadone UR: NOT DETECTED
POC Methamphetamine UR: NOT DETECTED
POC Morphine: NOT DETECTED
POC Oxazepam (BZO): NOT DETECTED
POC Oxycodone UR: NOT DETECTED
POC Secobarbital (BAR): NOT DETECTED

## 2021-06-08 LAB — CBC WITH DIFFERENTIAL/PLATELET
Abs Immature Granulocytes: 0.02 10*3/uL (ref 0.00–0.07)
Basophils Absolute: 0 10*3/uL (ref 0.0–0.1)
Basophils Relative: 0 %
Eosinophils Absolute: 0.1 10*3/uL (ref 0.0–0.5)
Eosinophils Relative: 1 %
HCT: 42.2 % (ref 39.0–52.0)
Hemoglobin: 14.3 g/dL (ref 13.0–17.0)
Immature Granulocytes: 0 %
Lymphocytes Relative: 30 %
Lymphs Abs: 2.2 10*3/uL (ref 0.7–4.0)
MCH: 30.5 pg (ref 26.0–34.0)
MCHC: 33.9 g/dL (ref 30.0–36.0)
MCV: 90 fL (ref 80.0–100.0)
Monocytes Absolute: 0.5 10*3/uL (ref 0.1–1.0)
Monocytes Relative: 7 %
Neutro Abs: 4.5 10*3/uL (ref 1.7–7.7)
Neutrophils Relative %: 62 %
Platelets: 266 10*3/uL (ref 150–400)
RBC: 4.69 MIL/uL (ref 4.22–5.81)
RDW: 13.6 % (ref 11.5–15.5)
WBC: 7.4 10*3/uL (ref 4.0–10.5)
nRBC: 0 % (ref 0.0–0.2)

## 2021-06-08 LAB — RESP PANEL BY RT-PCR (FLU A&B, COVID) ARPGX2
Influenza A by PCR: NEGATIVE
Influenza B by PCR: NEGATIVE
SARS Coronavirus 2 by RT PCR: NEGATIVE

## 2021-06-08 LAB — TSH: TSH: 2.022 u[IU]/mL (ref 0.350–4.500)

## 2021-06-08 LAB — LIPID PANEL
Cholesterol: 227 mg/dL — ABNORMAL HIGH (ref 0–200)
HDL: 96 mg/dL (ref >40)
LDL Cholesterol: 104 mg/dL — ABNORMAL HIGH (ref 0–99)
Total CHOL/HDL Ratio: 2.4 RATIO
Triglycerides: 135 mg/dL (ref <150)
VLDL: 27 mg/dL (ref 0–40)

## 2021-06-08 LAB — MAGNESIUM: Magnesium: 2.3 mg/dL (ref 1.7–2.4)

## 2021-06-08 LAB — ETHANOL: Alcohol, Ethyl (B): 166 mg/dL — ABNORMAL HIGH (ref <10)

## 2021-06-08 MED ORDER — HYDROXYZINE HCL 25 MG PO TABS
25.0000 mg | ORAL_TABLET | Freq: Three times a day (TID) | ORAL | Status: DC | PRN
  Administered 2021-06-10: 25 mg via ORAL
  Filled 2021-06-08: qty 1

## 2021-06-08 MED ORDER — ALBUTEROL SULFATE HFA 108 (90 BASE) MCG/ACT IN AERS: 2.0000 | INHALATION_SPRAY | Freq: Four times a day (QID) | RESPIRATORY_TRACT | Status: DC | PRN

## 2021-06-08 MED ORDER — THIAMINE HCL 100 MG/ML IJ SOLN
100.0000 mg | Freq: Once | INTRAMUSCULAR | Status: AC
  Administered 2021-06-08: 100 mg via INTRAMUSCULAR
  Filled 2021-06-08: qty 2

## 2021-06-08 MED ORDER — HYDROXYZINE HCL 25 MG PO TABS: 25.0000 mg | ORAL_TABLET | Freq: Four times a day (QID) | ORAL | Status: DC | PRN

## 2021-06-08 MED ORDER — ADULT MULTIVITAMIN W/MINERALS CH
1.0000 | ORAL_TABLET | Freq: Every day | ORAL | Status: DC
  Administered 2021-06-08 – 2021-06-10 (×3): 1 via ORAL
  Filled 2021-06-08 (×3): qty 1

## 2021-06-08 MED ORDER — ALUM & MAG HYDROXIDE-SIMETH 200-200-20 MG/5ML PO SUSP
30.0000 mL | ORAL | Status: DC | PRN
  Administered 2021-06-10: 30 mL via ORAL
  Filled 2021-06-08: qty 30

## 2021-06-08 MED ORDER — LORAZEPAM 1 MG PO TABS
1.0000 mg | ORAL_TABLET | Freq: Four times a day (QID) | ORAL | Status: DC | PRN
Start: 2021-06-08 — End: 2021-06-10
  Administered 2021-06-09 (×2): 1 mg via ORAL
  Filled 2021-06-08 (×2): qty 1

## 2021-06-08 MED ORDER — TRAZODONE HCL 50 MG PO TABS
50.0000 mg | ORAL_TABLET | Freq: Every evening | ORAL | Status: DC | PRN
  Administered 2021-06-09: 50 mg via ORAL
  Filled 2021-06-08: qty 1

## 2021-06-08 MED ORDER — THIAMINE HCL 100 MG PO TABS
100.0000 mg | ORAL_TABLET | Freq: Every day | ORAL | Status: DC
  Administered 2021-06-09 – 2021-06-10 (×2): 100 mg via ORAL
  Filled 2021-06-08 (×2): qty 1

## 2021-06-08 MED ORDER — MAGNESIUM HYDROXIDE 400 MG/5ML PO SUSP: 30.0000 mL | Freq: Every day | ORAL | Status: DC | PRN

## 2021-06-08 MED ORDER — ONDANSETRON 4 MG PO TBDP
4.0000 mg | ORAL_TABLET | Freq: Four times a day (QID) | ORAL | Status: DC | PRN
  Administered 2021-06-09 (×2): 4 mg via ORAL
  Filled 2021-06-08 (×2): qty 1

## 2021-06-08 MED ORDER — ACETAMINOPHEN 325 MG PO TABS
650.0000 mg | ORAL_TABLET | Freq: Four times a day (QID) | ORAL | Status: DC | PRN
  Administered 2021-06-09 – 2021-06-10 (×2): 650 mg via ORAL
  Filled 2021-06-08 (×2): qty 2

## 2021-06-08 MED ORDER — GABAPENTIN 300 MG PO CAPS
300.0000 mg | ORAL_CAPSULE | Freq: Three times a day (TID) | ORAL | Status: DC
  Administered 2021-06-08 – 2021-06-10 (×7): 300 mg via ORAL
  Filled 2021-06-08 (×7): qty 1

## 2021-06-08 MED ORDER — LOPERAMIDE HCL 2 MG PO CAPS: 2.0000 mg | ORAL_CAPSULE | ORAL | Status: DC | PRN

## 2021-06-08 NOTE — ED Notes (Signed)
Alert and oriented x4 , reports some anxiety and also has been thinking about his past ; loss of girlfriend , substance use and desire to get off drugs and alcohol . Pt denies SI / HI , affect is blunted , speech is organizied and goal directed , denies SI / HI will continue to monitor for safety .

## 2021-06-08 NOTE — Progress Notes (Signed)
Patient oriented to the unit.  Holding to go to [facility] for substance abuse help.

## 2021-06-08 NOTE — Progress Notes (Signed)
Patient presents to the [facility] as a walk-in seeking detoxification services for his alcohol problem.  Patient states that he has a history of sobriety up to two years or more at a time in his past. He has been in treatment in the past at [facility], [facility], [facility], he has been in [facility] in the past  Most recently, he states that he was sober for three months, but relapsed 6-7 weeks ago.  Patient states that on average that he has been drinking 15 beers daily on average and states that he has drank eight beers today since 11:00 am.  Patient states that he has also been smoking 2 grams of marijuana daily and states that he has been snorting cocaine, an undetermined amount, every other day. Patient states that he currently not in any withdrawal, but states that when he does not drink that he experiences withdrawal symptoms.  He states that he has experienced DTs on one occasion in the past.  Patient denies any SI/HI/Psychosis.  He states that because of his drinking that he has expereinced sleep and appetite disturbance.  He denies any history of abuse of self-mutilation.

## 2021-06-08 NOTE — ED Notes (Signed)
PT has been brought on the unit and familiarized with the unit. PT stated he was hungry when brought over. PT was given a [place] sandwich, chips and water

## 2021-06-08 NOTE — BH Assessment (Signed)
Comprehensive Clinical Assessment (CCA) Note  06/08/2021 Brendan Ochoa [id number]  Disposition:  Per Beatriz Stallion, NP, [facility] admission for detox is recommended   The patient demonstrates the following risk factors for suicide: Chronic risk factors for suicide include: psychiatric disorder of depression and substance use disorder. Acute risk factors for suicide include: N/A. Protective factors for this patient include: hope for the future. Considering these factors, the overall suicide risk at this point appears to be low. Patient is not appropriate for outpatient follow up due for his need for detox.  [place] Office Visit from 06/14/2020 in [place] Office Visit from 05/27/2020 in [place]  Total GAD-7 Score 11 9      PHQ2-9    Flowsheet Row ED from 04/26/2021 in [facility] Office Visit from 06/14/2020 in [place] Office Visit from 05/27/2020 in [place] [facility] [facility]/2017 [facility]  PHQ-2 Total Score 4 2 2  0 0  PHQ-9 Total Score 19 15 11  -- --      Flowsheet Row ED from 06/08/2021 in [facility] ED from 04/26/2021 in [facility] ED from 10/02/2020 in [place] No Risk No Risk No Risk        Chief Complaint:  Chief Complaint  Patient presents with   Addiction Problem   Visit Diagnosis: F10.20 Alcohol Use Disorder Severe, F 14.20 Cocaine Use Disorder Severe, F12.20 Cannabis Use Disorder Severe     CCA Screening, Triage and Referral (STR)  Patient Reported Information How did you hear about [name]? Self  What Is the Reason for Your Visit/Call Today? Patient  presents to the [facility] as a walk-in seeking detoxification services for his alcohol problem.  Patient states that he has a history of sobriety up to two years or more at a time in his past. He has been in treatment in the past at [facility], [facility], [facility], he has been in [facility] in the past  Most recently, he states that he was sober for three months, but relapsed 6-7 weeks ago.  Patient states that on average that he has been drinking 15 beers daily on average and states that he has drank eight beers today since 11:00 am.  Patient states that he has also been smoking 2 grams of marijuana daily and states that he has been snorting cocaine, an undetermined amount, every other day. Patient states that he currently not in any withdrawal, but states that when he does not drink that he experiences withdrawal symptoms.  He states that he has experienced DTs on one occasion in the past.  Patient denies any SI/HI/Psychosis.  He states that because of his drinking that he has expereinced sleep and appetite disturbance.  He denies any history of abuse of self-mutilation.  How Long Has This Been Causing You Problems? 1-6 months  What Do You Feel Would Help You the Most Today? Alcohol or Drug Use Treatment   Have You Recently Had Any Thoughts About Hurting Yourself? No  Are You Planning to Commit Suicide/Harm Yourself At This time? No   Have you Recently Had Thoughts About [place]? No  Are You Planning to Harm Someone at This Time? No  Explanation:  No data recorded  Have You Used Any Alcohol or Drugs in the Past 24 Hours? Yes  How Long Ago Did You Use Drugs or Alcohol? No data recorded What Did You Use and How Much? 8 beers   Do You Currently Have a Therapist/Psychiatrist? No  Name of Therapist/Psychiatrist: No data recorded  Have You Been Recently Discharged From Any Office Practice or Programs? No  Explanation of Discharge From Practice/Program: No data  recorded    CCA Screening Triage Referral Assessment Type of Contact: Face-to-Face  Telemedicine Service Delivery:   Is this Initial or Reassessment? No data recorded Date Telepsych consult ordered in [facility]:  No data recorded Time Telepsych consult ordered in [facility]:  No data recorded Location of Assessment: [facility]  Provider Location: [facility]   Collateral Involvement: None currently   Does Patient Have a [place]? No data recorded Name and Contact of Legal Guardian: No data recorded If Minor and Not Living with Parent(s), Who has Custody? N/A  Is CPS involved or ever been involved? Never  Is APS involved or ever been involved? Never   Patient Determined To Be At Risk for Harm To Self or Others Based on Review of Patient Reported Information or Presenting Complaint? No  Method: No data recorded Availability of Means: No data recorded Intent: No data recorded Notification Required: No data recorded Additional Information for Danger to Others Potential: No data recorded Additional Comments for Danger to Others Potential: No data recorded Are There Guns or Other Weapons in Your Home? No data recorded Types of Guns/Weapons: No data recorded Are These Weapons Safely Secured?                            No data recorded Who Could Verify You Are Able To Have These Secured: No data recorded Do You Have any Outstanding Charges, Pending Court Dates, Parole/Probation? No data recorded Contacted To Inform of Risk of Harm To Self or Others: -- (N/A)    Does Patient Present under Involuntary Commitment? No  IVC Papers Initial File Date: No data recorded  [place] of Residence: [name]   Patient Currently Receiving the Following Services: -- ([facility])   Determination of Need: Urgent (48 hours)   Options For Referral: Facility-Based Crisis     CCA Biopsychosocial Patient Reported  Schizophrenia/Schizoaffective Diagnosis in Past: No   Strengths: Pt is voluntarily seeking treatment for his SA.   Mental Health Symptoms Depression:   Change in energy/activity; Difficulty Concentrating; Fatigue; Hopelessness; Worthlessness; Increase/decrease in appetite; Sleep (too much or little)   Duration of Depressive symptoms:    Mania:   None   Anxiety:    Difficulty concentrating; Fatigue; Sleep; Tension; Worrying   Psychosis:   None   Duration of Psychotic symptoms:    Trauma:   None   Obsessions:   None   Compulsions:   None   Inattention:   None   Hyperactivity/Impulsivity:   N/A   Oppositional/Defiant Behaviors:   None   Emotional Irregularity:   Chronic feelings of emptiness; Mood lability; Potentially harmful impulsivity   Other Mood/Personality Symptoms:   None noted    Mental Status Exam Appearance and self-care  Stature:   Average   Weight:   Average weight   Clothing:   Neat/clean   Grooming:   Normal   Cosmetic use:   None   Posture/gait:   Normal  Motor activity:   Not Remarkable   Sensorium  Attention:   Normal   Concentration:   Normal   Orientation:   X5   Recall/memory:   Normal   Affect and Mood  Affect:   Depressed   Mood:   Depressed   Relating  Eye contact:   Normal   Facial expression:   Depressed   Attitude toward examiner:   Cooperative   Thought and Language  Speech flow:  Soft   Thought content:   Appropriate to Mood and Circumstances   Preoccupation:   None   Hallucinations:   None   Organization:  No data recorded  [employer] of Knowledge:   Average   Intelligence:   Average   Abstraction:   Normal   Judgement:   Impaired   Reality Testing:   Adequate   Insight:   Fair   Decision Making:   Impulsive   Social Functioning  Social Maturity:   Impulsive   Social Judgement:   Normal   Stress  Stressors:   Housing; Scientist, research (physical sciences); Work;  Transitions   Coping Ability:   Exhausted   Skill Deficits:   Activities of daily living; Self-control   Supports:   Usual; Friends/Service system     Religion: Religion/Spirituality Are You A Religious Person?: No How Might This Affect Treatment?: Not assessed  Leisure/Recreation: Leisure / Recreation Do You Have Hobbies?: No  Exercise/Diet: Exercise/Diet Do You Exercise?: No Have You Gained or Lost A Significant Amount of Weight in the Past Six Months?: No Do You Follow a Special Diet?: No Do You Have Any Trouble Sleeping?: Yes Explanation of Sleeping Difficulties: Pt shares he has difficulties falling asleep and is typically up after 2-ish hours   CCA Employment/Education Employment/Work Situation: Employment / Work Situation Employment Situation: Employed Work Stressors: 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 Job has Been Impacted by Current Illness: Yes Describe how Patient's Job has Been Impacted: Pt feels sick frequently Has Patient ever Been in the [employer]?: No  Education: Education Is Patient Currently Attending School?: No Last Grade Completed: 12 Did You Attend College?: Yes What Type of College Degree Do you Have?: Some college at [employer] Did You Have An Individualized Education Program (IIEP): No Patient's Education Has Been Impacted by Current Illness: No   CCA Family/Childhood History Family and Relationship History: Family history Marital status: Single Does patient have children?: No  Childhood History:  Childhood History By whom was/is the patient raised?: Both parents Did patient suffer any verbal/emotional/physical/sexual abuse as a child?: No Did patient suffer from severe childhood neglect?: No Has patient ever been sexually abused/assaulted/raped as an adolescent or adult?: No Was the patient ever a victim of a crime or a disaster?: No Witnessed domestic violence?: No Has patient been affected by  domestic violence as an adult?: No  Child/Adolescent Assessment:     CCA Substance Use Alcohol/Drug Use: Alcohol / Drug Use Pain Medications: See MAR Over the Counter: See MAR History of alcohol / drug use?: Yes Longest period of sobriety (when/how long): 2 years sober approximately 10 years ago Negative Consequences of Use: Personal relationships, Legal, Financial Withdrawal Symptoms: Agitation, Sweats, Weakness, Nausea / Vomiting, Irritability, Fever / Chills, DTs, Patient aware of relationship between substance abuse and physical/medical complications, Tremors Substance #1 Name of Substance 1: EtOH 1 - Age of First Use: Unknown 1 - Amount (size/oz): 15 beers daily 1 - Frequency: Daily 1 - Duration: Unknown 1 -  Last Use / Amount: Today 1 - Method of Aquiring: Purchase 1- Route of Use: oral Substance #2 Name of Substance 2: cocaine 2 - Age of First Use: unknown 2 - Amount (size/oz): varies usually 1 gram 2 - Frequency: past 6-7 weeks 2 - Duration: Ongoing 2 - Last Use / Amount: unknown 2 - Method of Aquiring: off the street 2 - Route of Substance Use: snort Substance #3 Name of Substance 3: marijuana 3 - Age of First Use: dailyx 6-7 weeks 3 - Amount (size/oz): 2 grams 3 - Frequency: daily 3 - Duration: unknown 3 - Last Use / Amount: unknown 3 - Method of Aquiring: off the street 3 - Route of Substance Use: smoke                   ASAM's:  Six Dimensions of Multidimensional Assessment  Dimension 1:  Acute Intoxication and/or Withdrawal Potential:   Dimension 1:  Description of individual's past and current experiences of substance use and withdrawal: Pt shares he's experienced VH, sweats, nausea and vomiting, shakes, DTs, etc when w/d  Dimension 2:  Biomedical Conditions and Complications:   Dimension 2:  Description of patient's biomedical conditions and  complications: Biomedical conditions/complications are related to pt's SA  Dimension 3:  Emotional,  Behavioral, or Cognitive Conditions and Complications:  Dimension 3:  Description of emotional, behavioral, or cognitive conditions and complications: Pt is able to identify the correlation between SA and EBC conditions  Dimension 4:  Readiness to Change:  Dimension 4:  Description of Readiness to Change criteria: Pt shares he is ready to stop drinking; he is getting older and it's getting harder for him to recover  Dimension 5:  Relapse, Continued use, or Continued Problem Potential:  Dimension 5:  Relapse, continued use, or continued problem potential critiera description: Pt has an extensive hx of relapsing  Dimension 6:  Recovery/Living Environment:  Dimension 6:  Recovery/Iiving environment criteria description: Pt is currently living at [facility], though he states they don't really care if they use  ASAM Severity Score: ASAM's Severity Rating Score: 15  ASAM Recommended Level of Treatment:     Substance use Disorder (SUD) Substance Use Disorder (SUD)  Checklist Symptoms of Substance Use: Continued use despite having a persistent/recurrent physical/psychological problem caused/exacerbated by use, Continued use despite persistent or recurrent social, interpersonal problems, caused or exacerbated by use, Evidence of tolerance, Evidence of withdrawal (Comment), Persistent desire or unsuccessful efforts to cut down or control use, Presence of craving or strong urge to use, Substance(s) often taken in larger amounts or over longer times than was intended  Recommendations for Services/Supports/Treatments: Recommendations for Services/Supports/Treatments Recommendations For Services/Supports/Treatments: Medication Management, Individual Therapy, Other (Comment)  Discharge Disposition:    DSM5 Diagnoses: Patient Active Problem List   Diagnosis Date Noted   Alcohol use disorder, severe, dependence (HCC)    Cocaine use disorder, severe, dependence (HCC)    Cannabis use disorder, severe,  dependence (HCC)    Alcohol abuse with withdrawal (HCC) 04/27/2021   Acute encephalopathy    Hypokalemia    Tobacco abuse    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   Epididymal cyst 06/14/2020   Unintentional weight loss 06/14/2020   Abnormal chest x-ray 06/14/2020   Elevated blood pressure reading without diagnosis of hypertension 05/27/2020   Influenza vaccination declined 05/27/2020   AKI (acute kidney injury) (HCC) 01/01/2020  Vomiting 01/01/2020   Cocaine abuse ([place]) 01/01/2020   Alcohol abuse 01/01/2020   Ileitis 02/18/2018   HYPERGLYCEMIA 11/01/2009   Tobacco dependence 07/18/2007   SUBSTANCE ABUSE, MULTIPLE 07/18/2007   ALLERGIC RHINITIS 07/18/2007   ASTHMA 07/18/2007   GERD 07/18/2007   Crohn's disease ([place]) 07/18/2007     Referrals to Alternative Service(s): Referred to Alternative Service(s):   Place:   Date:   Time:    Referred to Alternative Service(s):   Place:   Date:   Time:    Referred to Alternative Service(s):   Place:   Date:   Time:    Referred to Alternative Service(s):   Place:   Date:   Time:     Iyauna Sing J Sayward Horvath, LCAS

## 2021-06-08 NOTE — ED Provider Notes (Signed)
Behavioral Health Admission H&P [facility] & OBS)  Date: 06/08/21 Patient Name: Brendan Ochoa MRN: [id number] Chief Complaint:  Chief Complaint  Patient presents with   Addiction Problem      Diagnoses:  Final diagnoses:  Alcohol abuse  Cocaine abuse [facility])    HPI: Patient presents voluntarily to [facility] behavioral health for walk-in assessment.  Patient states "I am tired of drinking, I need a kick start because I want to quit drinking, I am a little scared because I have suffered severe withdrawal in the past."  [name] shares that recent stressors include "bouncing around between family and friends for the last month for a place to stay."  He has resided with his parents, reports he gets along well with his mother, he "butts heads a lot with my father."  Patient reports he would like to "go to a long-term program, I feel like I need a lot of time away."  Patient relapsed on alcohol approximately 1 month ago.  He has consumed an average of 15 beers per day, every day for 1 month.  Last alcohol use, earlier today.  He also uses marijuana daily.  Last use of marijuana earlier today.  He has used cocaine intermittently over the past month.  Typically uses cocaine about every other day.  Last use of cocaine on yesterday.  [name]'s ex-girlfriend passed away 2 weeks ago after an unintentional heroin overdose.  He reports he was very close with his ex-girlfriend.  He is also scheduled for 2 upcoming court dates.  On 06/25/21 he has court after driving with a revoked license.  On 07/07/2021 he has court "for DWI that by attorney did not handle appropriately."  As he understands it he is not required to present for either of these court dates.  He has spoken with his probation officer, states probation officer agrees with plan to seek long-term, residential substance use treatment.  Patient is assessed face-to-face by nurse practitioner.  He is seated in assessment area, no acute distress.  He is  alert and oriented, pleasant and cooperative during assessment.   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  He is currently followed by outpatient psychiatry [facility] [place].  He meets with his therapist periodically.  He also began attending classes on every Tuesday and Thursday 2 weeks ago.  He has an upcoming appointment with psychiatry for medication management on Tuesday, June 13, 2021.  He presents with euthymic mood, congruent affect. He denies suicidal and homicidal ideations.  He denies history of suicide attempts, denies history of  non suicidal self-harm behaviors.  He contracts verbally for safety with this Probation officer.  Jaisean has normal speech and behavior.  He denies both auditory and visual hallucinations.  Patient is able to converse coherently with goal-directed thoughts and no distractibility or preoccupation.  He denies paranoia.  Objectively there is no evidence of psychosis/mania or delusional thinking.  [name] has recently resided with friends and family.  He denies access to weapons.  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  Patient endorses average sleep and appetite.  Patient offered support and encouragement.   PHQ 2-9:  [place] ED from 04/26/2021 in [facility] Office Visit from 06/14/2020 in [place] Office Visit from 05/27/2020 in [place]  Thoughts  that you would be better off dead, or of hurting yourself in some way Several days Not at all Not at all  PHQ-9 Total Score 19 15 11        Flowsheet Row ED from 06/08/2021 in [facility] ED from 04/26/2021 in [facility] ED from 10/02/2020 in [place] No Risk No Risk No Risk        Total Time spent with patient: 20 minutes  Musculoskeletal  Strength & Muscle Tone: within normal limits Gait & Station: normal Patient leans: N/A  Psychiatric Specialty Exam  Presentation General Appearance: Appropriate for Environment; Casual  Eye Contact:Good  Speech:Clear and Coherent; Normal Rate  Speech Volume:Normal  Handedness:Right   Mood and Affect  Mood:Euthymic  Affect:Appropriate; Congruent   Thought Process  Thought Processes:Coherent; Goal Directed; Linear  Descriptions of Associations:Intact  Orientation:Full (Time, Place and Person)  Thought Content:Logical; WDL  Diagnosis of Schizophrenia or Schizoaffective disorder in past: No   Hallucinations:Hallucinations: None  Ideas of Reference:None  Suicidal Thoughts:Suicidal Thoughts: No  Homicidal Thoughts:Homicidal Thoughts: No   Sensorium  Memory:Immediate Good; Recent Good; Remote Good  Judgment:Fair  Insight:Present   Executive Functions  Concentration:Good  Attention Span:Good  [place] of Knowledge:Good  Language:Good   Psychomotor Activity  Psychomotor Activity:Psychomotor Activity: Normal   Assets  Assets:Communication Skills; Desire for Improvement; Leisure Time; Resilience; Social Support   Sleep  Sleep:Sleep: Fair   Nutritional Assessment (For OBS and FBC admissions only) Has the patient had a weight loss or gain of 10 pounds or more in the last 3 months?: No Has the patient had a decrease in food intake/or appetite?: No Does the patient have dental problems?: No Does the patient have eating habits or behaviors that may be indicators of an eating disorder including binging or inducing vomiting?: No Has the patient recently lost weight without trying?: 0 Has the patient been eating poorly because of a decreased appetite?: 0 Malnutrition Screening Tool Score: 0    Physical Exam Vitals and nursing note  reviewed.  Constitutional:      Appearance: Normal appearance. He is well-developed.  HENT:     Head: Normocephalic and atraumatic.     Nose: Nose normal.  Cardiovascular:     Rate and Rhythm: Normal rate.  Pulmonary:     Effort: Pulmonary effort is normal.  Musculoskeletal:        General: Normal range of motion.     Cervical back: Normal range of motion.  Skin:    General: Skin is warm and dry.  Neurological:     Mental Status: He is alert and oriented to person, place, and time.  Psychiatric:        Attention and Perception: Attention and perception normal.        Mood and Affect: Mood and affect normal.        Speech: Speech normal.        Behavior: Behavior normal. Behavior is cooperative.        Thought Content: Thought content normal.        Cognition and Memory: Cognition and memory normal.        Judgment: Judgment normal.   Review of Systems  Constitutional: Negative.   HENT: Negative.    Eyes: Negative.   Respiratory: Negative.    Cardiovascular: Negative.   Gastrointestinal: Negative.   Genitourinary: Negative.   Musculoskeletal: Negative.   Skin: Negative.  Neurological: Negative.   Endo/Heme/Allergies: Negative.   Psychiatric/Behavioral:  Positive for substance abuse.    Blood pressure 129/78, pulse (!) 101, temperature 98.1 F (36.7 C), temperature source Oral, resp. rate 18, SpO2 96 %. There is no height or weight on file to calculate BMI.  Past Psychiatric History: Alcohol use disorder, cocaine use disorder, cannabis use disorder, stimulant induced psychosis  Is the patient at risk to self? No  Has the patient been a risk to self in the past 6 months? No .    Has the patient been a risk to self within the distant past? No   Is the patient a risk to others? No   Has the patient been a risk to others in the past 6 months? No   Has the patient been a risk to others within the distant past? No   Past Medical History:  Past Medical History:   Diagnosis Date   Asthma    Cocaine abuse ([place])    in past   Crohn disease ([place]) 2008   Depression    in past, not current as of 2019    ETOH abuse    history of alcohol use   GERD (gastroesophageal reflux disease)     Past Surgical History:  Procedure Laterality Date   BIOPSY  10/15/2017   Procedure: BIOPSY;  Surgeon: Danie Binder, MD;  Location: [facility];  Service: Endoscopy;;  terminal ileum colon   COLONOSCOPY WITH PROPOFOL N/A 10/15/2017   redundant colon, normal rectum, external and internal hemorrhoids, quiescent crohn's. Pathology: inactive chronicn nonspecific ileitis   ESOPHAGOGASTRODUODENOSCOPY (EGD) WITH PROPOFOL N/A 10/15/2017   normal esophagus, small hiatal hernia, gastritis, duodenitis   FINGER AMPUTATION Right 2012   cut finger table saw and surgically amputated    Family History:  Family History  Problem Relation Age of Onset   Stomach cancer Maternal Grandfather    Colon cancer Neg Hx     Social History:  Social History   Socioeconomic History   Marital status: Single    Spouse name: Not on file   Number of children: Not on file   Years of education: Not on file   Highest education level: Not on file  Occupational History   Not on file  Tobacco Use   Smoking status: Every Day    Packs/day: 0.50    Years: 15.00    Pack years: 7.50    Types: Cigarettes   Smokeless tobacco: Never   Tobacco comments:    1/4 to 1/2 pack per day   Vaping Use   Vaping Use: Never used  Substance and Sexual Activity   Alcohol use: Not Currently    Comment: alcoholic- last use [id number]   Drug use: Not Currently    Types: Cocaine, Marijuana, Methamphetamines    Comment: last cocaine use 10/10/20.  last meth april 2022   Sexual activity: Not on file  Other Topics Concern   Not on file  Social History Narrative   Not on file   Social Determinants of Health   Financial Resource Strain: Not on file  Food Insecurity: Not on file  Transportation Needs: Not on  file  Physical Activity: Not on file  Stress: Not on file  Social Connections: Not on file  Intimate Partner Violence: Not on file    SDOH:  SDOH Screenings   Alcohol Screen: Not on file  Depression (PHQ2-9): Medium Risk   PHQ-2 Score: 19  Financial Resource Strain: Not on file  Food Insecurity: Not on file  Housing: Not on file  Physical Activity: Not on file  Social Connections: Not on file  Stress: Not on file  Tobacco Use: High Risk   Smoking Tobacco Use: Every Day   Smokeless Tobacco Use: Never   Passive Exposure: Not on file  Transportation Needs: Not on file    Last Labs:  Admission on 04/26/2021, Discharged on 04/28/2021  Component Date Value Ref Range Status   SARS Coronavirus 2 by RT PCR 04/27/2021 NEGATIVE  NEGATIVE Final   Comment: (NOTE) SARS-CoV-2 target nucleic acids are NOT DETECTED.  The SARS-CoV-2 RNA is generally detectable in upper respiratory specimens during the acute phase of infection. The lowest concentration of SARS-CoV-2 viral copies this assay can detect is 138 copies/mL. A negative result does not preclude SARS-Cov-2 infection and should not be used as the sole basis for treatment or other patient management decisions. A negative result may occur with  improper specimen collection/handling, submission of specimen other than nasopharyngeal swab, presence of viral mutation(s) within the areas targeted by this assay, and inadequate number of viral copies(<138 copies/mL). A negative result must be combined with clinical observations, patient history, and epidemiological information. The expected result is Negative.  Fact Sheet for Patients:  EntrepreneurPulse.com.au  Fact Sheet for Healthcare Providers:  IncredibleEmployment.be  This test is no                          t yet approved or cleared by the [place] FDA and  has been authorized for detection and/or diagnosis of SARS-CoV-2 by FDA under an  Emergency Use Authorization (EUA). This EUA will remain  in effect (meaning this test can be used) for the duration of the COVID-19 declaration under Section 564(b)(1) of the Act, 21 U.S.C.section 360bbb-3(b)(1), unless the authorization is terminated  or revoked sooner.       Influenza A by PCR 04/27/2021 NEGATIVE  NEGATIVE Final   Influenza B by PCR 04/27/2021 NEGATIVE  NEGATIVE Final   Comment: (NOTE) The Xpert Xpress SARS-CoV-2/FLU/RSV plus assay is intended as an aid in the diagnosis of influenza from Nasopharyngeal swab specimens and should not be used as a sole basis for treatment. Nasal washings and aspirates are unacceptable for Xpert Xpress SARS-CoV-2/FLU/RSV testing.  Fact Sheet for Patients: EntrepreneurPulse.com.au  Fact Sheet for Healthcare Providers: IncredibleEmployment.be  This test is not yet approved or cleared by the [place] FDA and has been authorized for detection and/or diagnosis of SARS-CoV-2 by FDA under an Emergency Use Authorization (EUA). This EUA will remain in effect (meaning this test can be used) for the duration of the COVID-19 declaration under Section 564(b)(1) of the Act, 21 U.S.C. section 360bbb-3(b)(1), unless the authorization is terminated or revoked.  Performed at [facility], [place] [address] [place], [place] [address]    SARS Coronavirus 2 Ag 04/27/2021 Negative  Negative Preliminary   WBC 04/27/2021 6.2  4.0 - 10.5 K/uL Final   RBC 04/27/2021 5.19  4.22 - 5.81 MIL/uL Final   Hemoglobin 04/27/2021 15.9  13.0 - 17.0 g/dL Final   HCT 04/27/2021 45.5  39.0 - 52.0 % Final   MCV 04/27/2021 87.7  80.0 - 100.0 fL Final   MCH 04/27/2021 30.6  26.0 - 34.0 pg Final   MCHC 04/27/2021 34.9  30.0 - 36.0 g/dL Final   RDW 04/27/2021 13.6  11.5 - 15.5 % Final   Platelets 04/27/2021 269  150 - 400 K/uL  Final   nRBC 04/27/2021 0.0  0.0 - 0.2 % Final   Neutrophils Relative % 04/27/2021 34  % Final    Neutro Abs 04/27/2021 2.1  1.7 - 7.7 K/uL Final   Lymphocytes Relative 04/27/2021 55  % Final   Lymphs Abs 04/27/2021 3.4  0.7 - 4.0 K/uL Final   Monocytes Relative 04/27/2021 7  % Final   Monocytes Absolute 04/27/2021 0.4  0.1 - 1.0 K/uL Final   Eosinophils Relative 04/27/2021 3  % Final   Eosinophils Absolute 04/27/2021 0.2  0.0 - 0.5 K/uL Final   Basophils Relative 04/27/2021 1  % Final   Basophils Absolute 04/27/2021 0.0  0.0 - 0.1 K/uL Final   Immature Granulocytes 04/27/2021 0  % Final   Abs Immature Granulocytes 04/27/2021 0.01  0.00 - 0.07 K/uL Final   Performed at [facility], [place] [address] [place], [place] [address]   Sodium 04/27/2021 138  135 - 145 mmol/L Final   Potassium 04/27/2021 4.2  3.5 - 5.1 mmol/L Final   Chloride 04/27/2021 105  98 - 111 mmol/L Final   CO2 04/27/2021 24  22 - 32 mmol/L Final   Glucose, Bld 04/27/2021 89  70 - 99 mg/dL Final   Glucose reference range applies only to samples taken after fasting for at least 8 hours.   BUN 04/27/2021 13  6 - 20 mg/dL Final   Creatinine, Ser 04/27/2021 0.73  0.61 - 1.24 mg/dL Final   Calcium 04/27/2021 9.5  8.9 - 10.3 mg/dL Final   Total Protein 04/27/2021 6.6  6.5 - 8.1 g/dL Final   Albumin 04/27/2021 4.0  3.5 - 5.0 g/dL Final   AST 04/27/2021 38  15 - 41 U/L Final   ALT 04/27/2021 23  0 - 44 U/L Final   Alkaline Phosphatase 04/27/2021 89  38 - 126 U/L Final   Total Bilirubin 04/27/2021 0.8  0.3 - 1.2 mg/dL Final   GFR, Estimated 04/27/2021 >60  >60 mL/min Final   Comment: (NOTE) Calculated using the CKD-EPI Creatinine Equation (2021)    Anion gap 04/27/2021 9  5 - 15 Final   Performed at [facility] [address], [place], [place] [address]   Hgb A1c MFr Bld 04/27/2021 5.7 (H)  4.8 - 5.6 % Final   Comment: (NOTE) Pre diabetes:          5.7%-6.4%  Diabetes:              >6.4%  Glycemic control for   <7.0% adults with diabetes    Mean Plasma Glucose 04/27/2021 116.89  mg/dL Final    Performed at [facility], [place] [address], [place], [place] [address]   Alcohol, Ethyl (B) 04/27/2021 251 (H)  <10 mg/dL Final   Comment: (NOTE) Lowest detectable limit for serum alcohol is 10 mg/dL.  For medical purposes only. Performed at [facility], [place] [address], [place], [place] [address]    Cholesterol 04/27/2021 209 (H)  0 - 200 mg/dL Final   Triglycerides 04/27/2021 615 (H)  <150 mg/dL Final   HDL 04/27/2021 66  >40 mg/dL Final   Total CHOL/HDL Ratio 04/27/2021 3.2  RATIO Final   VLDL 04/27/2021 UNABLE TO CALCULATE IF TRIGLYCERIDE OVER 400 mg/dL  0 - 40 mg/dL Final   LDL Cholesterol 04/27/2021 UNABLE TO CALCULATE IF TRIGLYCERIDE OVER 400 mg/dL  0 - 99 mg/dL Final   Comment:        Total Cholesterol/HDL:CHD Risk Coronary  Heart Disease Risk Table                     Men   Women  1/2 Average Risk   3.4   3.3  Average Risk       5.0   4.4  2 X Average Risk   9.6   7.1  3 X Average Risk  23.4   11.0        Use the calculated Patient Ratio above and the CHD Risk Table to determine the patient's CHD Risk.        ATP III CLASSIFICATION (LDL):  <100     mg/dL   Optimal  100-129  mg/dL   Near or Above                    Optimal  130-159  mg/dL   Borderline  160-189  mg/dL   High  >190     mg/dL   Very High Performed at [facility] [address], [place], [place] [address]    TSH 04/27/2021 2.502  0.350 - 4.500 uIU/mL Final   Comment: Performed by a 3rd Generation assay with a functional sensitivity of <=0.01 uIU/mL. Performed at [facility], [place] [address] [place], [place] [address]    POC Amphetamine UR 04/27/2021 None Detected  NONE DETECTED (Cut Off Level 1000 ng/mL) Final   POC Secobarbital (BAR) 04/27/2021 None Detected  NONE DETECTED (Cut Off Level 300 ng/mL) Final   POC Buprenorphine (BUP) 04/27/2021 None Detected  NONE DETECTED (Cut Off Level 10 ng/mL) Final   POC Oxazepam (BZO) 04/27/2021 None Detected  NONE DETECTED (Cut Off Level  300 ng/mL) Final   POC Cocaine UR 04/27/2021 None Detected  NONE DETECTED (Cut Off Level 300 ng/mL) Final   POC Methamphetamine UR 04/27/2021 None Detected  NONE DETECTED (Cut Off Level 1000 ng/mL) Final   POC Morphine 04/27/2021 None Detected  NONE DETECTED (Cut Off Level 300 ng/mL) Final   POC Oxycodone UR 04/27/2021 None Detected  NONE DETECTED (Cut Off Level 100 ng/mL) Final   POC Methadone UR 04/27/2021 None Detected  NONE DETECTED (Cut Off Level 300 ng/mL) Final   POC Marijuana UR 04/27/2021 None Detected  NONE DETECTED (Cut Off Level 50 ng/mL) Final   SARSCOV2ONAVIRUS 2 AG 04/27/2021 NEGATIVE  NEGATIVE Final   Comment: (NOTE) SARS-CoV-2 antigen NOT DETECTED.   Negative results are presumptive.  Negative results do not preclude SARS-CoV-2 infection and should not be used as the sole basis for treatment or other patient management decisions, including infection  control decisions, particularly in the presence of clinical signs and  symptoms consistent with COVID-19, or in those who have been in contact with the virus.  Negative results must be combined with clinical observations, patient history, and epidemiological information. The expected result is Negative.  Fact Sheet for Patients: HandmadeRecipes.com.cy  Fact Sheet for Healthcare Providers: FuneralLife.at  This test is not yet approved or cleared by the [place] FDA and  has been authorized for detection and/or diagnosis of SARS-CoV-2 by FDA under an Emergency Use Authorization (EUA).  This EUA will remain in effect (meaning this test can be used) for the duration of  the COV                          ID-19 declaration under Section 564(b)(1) of the Act, 21 U.S.C. section 360bbb-3(b)(1), unless the authorization is terminated  or revoked sooner.     Direct LDL 04/27/2021 103.4 (H)  0 - 99 mg/dL Final   Performed at [facility] [address], [place], [place]  [address]    Allergies: Patient has no known allergies.  PTA Medications: (Not in a hospital admission)   Medical Decision Making  Patient reviewed with Dr. Serafina Mitchell.  Patient will be placed on facility based crisis unit for treatment and stabilization.  He remains voluntary at this time.  Laboratory studies ordered including CBC, CMP, ethanol, A1c, hepatic function, lipid panel, magnesium, prolactin and TSH.  Urine drug screen order initiated.  EKG ordered.  Current medications: -Acetaminophen 650 mg every 6 as needed/mild pain -Maalox 30 mL oral every 4 as needed/digestion -Hydroxyzine 25 mg 3 times daily as needed/anxiety -Magnesium hydroxide 30 mL daily as needed/mild constipation -Trazodone 50 mg nightly as needed/sleep  Restarted home medications including: -Albuterol Ventolin 2 puff inhalation every 6 hours as needed wheezing or shortness of breath -Gabapentin 300 mg 3 times daily  CIWA Ativan protocol initiated: -Loperamide 2 to 4 mg oral as needed/diarrhea or loose stools -Lorazepam 1 mg every 6 hours as needed CIWA greater than 10 -Multivitamin with minerals 1 tablet daily -Ondansetron disintegrating tablet 4 mg every 6 as needed/nausea or vomiting -Thiamine injection 100 mg IV once -Thiamine tablet 100 mg daily   Recommendations  Based on my evaluation the patient does not appear to have an emergency medical condition.  Lucky Rathke, FNP 06/08/21  5:51 PM

## 2021-06-08 NOTE — ED Notes (Signed)
Appears to be resting quietly at this time , eyes closed , mrespirations even and unl;abored , no distress noted , will continue to monitor for safety

## 2021-06-08 NOTE — ED Notes (Signed)
Pt was searched and two string cross neckless were found and placed in locker #5.   No other contraband found.

## 2021-06-09 DIAGNOSIS — Z20822 Contact with and (suspected) exposure to covid-19: Secondary | ICD-10-CM | POA: Diagnosis not present

## 2021-06-09 DIAGNOSIS — F1410 Cocaine abuse, uncomplicated: Secondary | ICD-10-CM | POA: Diagnosis not present

## 2021-06-09 DIAGNOSIS — F1010 Alcohol abuse, uncomplicated: Secondary | ICD-10-CM | POA: Diagnosis not present

## 2021-06-09 LAB — PROLACTIN: Prolactin: 9.7 ng/mL (ref 4.0–15.2)

## 2021-06-09 MED ORDER — IBUPROFEN 400 MG PO TABS: 400.0000 mg | ORAL_TABLET | ORAL | Status: DC | PRN

## 2021-06-09 NOTE — Progress Notes (Addendum)
Pt's CIWA was 11.  PRN Ativan administered.

## 2021-06-09 NOTE — Clinical Social Work Psych Note (Signed)
LCSW Initial Note  LCSW and Dr. Serafina Mitchell, MD met with Dian Situ for introduction and to begin discussions regarding treatment and potential discharge planning.   [place] shared that he was experiencing an increase in withdrawal symptoms from his ongoing ETOH use and came to the [facility] seeking detox services. Bodin shared that he has been in [facility], however left due to relapsing. He states that he was sober for 3 months, but relapsed 6-7 weeks ago.  Patient states that on average that he has been drinking 15 beers on a daily basis.   Fre[place] shared that he has upcoming court dates on the 2/12 and 2/24 of this month. Patient states that he plans on contacting his attorney to see if he can get the dates moved so that he may participate residential treatment services.    LCSW will continue to follow.     Radonna Ricker, MSW, LCSW Clinical Education officer, museum ([place]) [facility]

## 2021-06-09 NOTE — ED Provider Notes (Addendum)
Behavioral Health Progress Note  Date and Time: 06/09/2021 11:51 AM Name: Brendan Ochoa MRN:  [id number]  Subjective:   46 year old male with history of substance use disorder, alcohol use disorder, MDD,  cocaine abuse who presented to the [facility] on 1/26 for assistance with alcohol detox and was admitted to the [facility] for alcohol detox and placed on CIWA protocol and restarted on gabapentin. Etoh 166; UDS+cocaine and marijuana  Per chart review, patient recently was admitted to the [facility] December 2022.  He presented in a similar fashion and was started on Ativan taper; however, he requested discharge prior to completion of taper stating that he had to meet his parole officer and take a drug test and not test positive for any substances.  CIWA scores were low throughout admission. Patient requested discharged and was discharged prior to completion of taper per her request.  Patient seen and chart reviewed-most recent CIWA score of 0 and VSS.  Patient interviewed in conjunction with social work this morning.  Patient found laying in bed in no acute distress.  Patient describes his mood as "feeling rough".  He reports alcohol withdrawal symptoms of nausea, tremor, headache and feeling jittery.  Patient states that since his last stay at the [facility] in December he was able to stay sober for a week but then began drinking consuming approximately a 15 pack of beer a day.  Patient states his last drink was yesterday.  Patient also reports using cocaine and marijuana intermittently with most recent use a couple days ago.  Patient denies SI/HI/AVH.  Patient states that he is interested in residential rehab; however, he does report he has upcoming court dates on the 12th the 24th of this month.  Patient states that he plans on contacting his attorney to see if he can get the dates moved so that he may attend rehab.  Discussed with patient that upcoming court dates maybe a barrier  to residential substance use treatment.   Patient verbalized understanding and states that he was informed of this  by the nurse practitioner he saw yesterday.  Discussed with patient that gabapentin will be continued and that he is currently on the CIWA protocol and will receive Ativan as needed based on symptoms.  Patient verbalized understanding. Patient was given the opportunity to ask questions and  All questions answered. Patient verbalized understanding regarding plan of care.    Diagnosis:  Final diagnoses:  Alcohol abuse  Cocaine abuse ([place])    Total Time spent with patient: 20 minutes  Past Psychiatric History: AUD, MDD, cocaine abuse, self reported bipolar disorder Past Medical History:  Past Medical History:  Diagnosis Date   Asthma    Cocaine abuse ([place])    in past   Crohn disease ([place]) 2008   Depression    in past, not current as of 2019    ETOH abuse    history of alcohol use   GERD (gastroesophageal reflux disease)     Past Surgical History:  Procedure Laterality Date   BIOPSY  10/15/2017   Procedure: BIOPSY;  Surgeon: Danie Binder, MD;  Location: [facility];  Service: Endoscopy;;  terminal ileum colon   COLONOSCOPY WITH PROPOFOL N/A 10/15/2017   redundant colon, normal rectum, external and internal hemorrhoids, quiescent crohn's. Pathology: inactive chronicn nonspecific ileitis   ESOPHAGOGASTRODUODENOSCOPY (EGD) WITH PROPOFOL N/A 10/15/2017   normal esophagus, small hiatal hernia, gastritis, duodenitis   FINGER AMPUTATION Right 2012   cut finger table saw and surgically amputated  Family History:  Family History  Problem Relation Age of Onset   Stomach cancer Maternal Grandfather    Colon cancer Neg Hx    Family Psychiatric  History:  Maternal grandfather-bipolar disorder "Rampant substance use" throughout the family No history of attempted or completed suicides in the family to his knowledge Social History:  Social History   Substance and Sexual Activity  Alcohol Use Not Currently    Comment: alcoholic- last use [id number]     Social History   Substance and Sexual Activity  Drug Use Not Currently   Types: Cocaine, Marijuana, Methamphetamines   Comment: last cocaine use 10/10/20.  last meth april 2022    Social History   Socioeconomic History   Marital status: Single    Spouse name: Not on file   Number of children: Not on file   Years of education: Not on file   Highest education level: Not on file  Occupational History   Not on file  Tobacco Use   Smoking status: Every Day    Packs/day: 0.50    Years: 15.00    Pack years: 7.50    Types: Cigarettes   Smokeless tobacco: Never   Tobacco comments:    1/4 to 1/2 pack per day   Vaping Use   Vaping Use: Never used  Substance and Sexual Activity   Alcohol use: Not Currently    Comment: alcoholic- last use [id number]   Drug use: Not Currently    Types: Cocaine, Marijuana, Methamphetamines    Comment: last cocaine use 10/10/20.  last meth april 2022   Sexual activity: Not on file  Other Topics Concern   Not on file  Social History Narrative   Not on file   Social Determinants of Health   Financial Resource Strain: Not on file  Food Insecurity: Not on file  Transportation Needs: Not on file  Physical Activity: Not on file  Stress: Not on file  Social Connections: Not on file   SDOH:  SDOH Screenings   Alcohol Screen: Not on file  Depression (PHQ2-9): Medium Risk   PHQ-2 Score: 19  Financial Resource Strain: Not on file  Food Insecurity: Not on file  Housing: Not on file  Physical Activity: Not on file  Social Connections: Not on file  Stress: Not on file  Tobacco Use: High Risk   Smoking Tobacco Use: Every Day   Smokeless Tobacco Use: Never   Passive Exposure: Not on file  Transportation Needs: Not on file   Additional Social History:    Pain Medications: See MAR Over the Counter: See MAR History of alcohol / drug use?: Yes Longest period of sobriety (when/how long): 2 years sober  approximately 10 years ago Negative Consequences of Use: Personal relationships, Legal, Financial Withdrawal Symptoms: Agitation, Sweats, Weakness, Nausea / Vomiting, Irritability, Fever / Chills, DTs, Patient aware of relationship between substance abuse and physical/medical complications, Tremors Name of Substance 1: EtOH 1 - Age of First Use: Unknown 1 - Amount (size/oz): 15 beers daily 1 - Frequency: Daily 1 - Duration: Unknown 1 - Last Use / Amount: Today 1 - Method of Aquiring: Purchase 1- Route of Use: oral Name of Substance 2: cocaine 2 - Age of First Use: unknown 2 - Amount (size/oz): varies usually 1 gram 2 - Frequency: past 6-7 weeks 2 - Duration: Ongoing 2 - Last Use / Amount: unknown 2 - Method of Aquiring: off the street 2 - Route of Substance Use: snort Name of Substance 3: marijuana 3 -  Age of First Use: dailyx 6-7 weeks 3 - Amount (size/oz): 2 grams 3 - Frequency: daily 3 - Duration: unknown 3 - Last Use / Amount: unknown 3 - Method of Aquiring: off the street 3 - Route of Substance Use: smoke              Sleep: Poor  Appetite:  Poor  Current Medications:  Current Facility-Administered Medications  Medication Dose Route Frequency Provider Last Rate Last Admin   acetaminophen (TYLENOL) tablet 650 mg  650 mg Oral Q6H PRN Lucky Rathke, FNP   650 mg at 06/09/21 0912   albuterol (VENTOLIN HFA) 108 (90 Base) MCG/ACT inhaler 2 puff  2 puff Inhalation Q6H PRN Lucky Rathke, FNP       alum & mag hydroxide-simeth (MAALOX/MYLANTA) 200-200-20 MG/5ML suspension 30 mL  30 mL Oral Q4H PRN Lucky Rathke, FNP       gabapentin (NEURONTIN) capsule 300 mg  300 mg Oral TID Lucky Rathke, FNP   300 mg at 06/09/21 0911   hydrOXYzine (ATARAX) tablet 25 mg  25 mg Oral TID PRN Lucky Rathke, FNP       loperamide (IMODIUM) capsule 2-4 mg  2-4 mg Oral PRN Lucky Rathke, FNP       LORazepam (ATIVAN) tablet 1 mg  1 mg Oral Q6H PRN Lucky Rathke, FNP       magnesium hydroxide  (MILK OF MAGNESIA) suspension 30 mL  30 mL Oral Daily PRN Lucky Rathke, FNP       multivitamin with minerals tablet 1 tablet  1 tablet Oral Daily Lucky Rathke, FNP   1 tablet at 06/09/21 0911   ondansetron (ZOFRAN-ODT) disintegrating tablet 4 mg  4 mg Oral Q6H PRN Lucky Rathke, FNP   4 mg at 06/09/21 [id number]   thiamine tablet 100 mg  100 mg Oral Daily Lucky Rathke, FNP   100 mg at 06/09/21 0911   traZODone (DESYREL) tablet 50 mg  50 mg Oral QHS PRN Lucky Rathke, FNP       Current Outpatient Medications  Medication Sig Dispense Refill   albuterol (VENTOLIN HFA) 108 (90 Base) MCG/ACT inhaler Inhale 2 puffs into the lungs every 6 (six) hours as needed for wheezing or shortness of breath. 8 g 0   Multiple Vitamin (MULTIVITAMIN WITH MINERALS) TABS tablet Take 1 tablet by mouth daily. 30 tablet 0    Labs  Lab Results:  Admission on 06/08/2021  Component Date Value Ref Range Status   SARS Coronavirus 2 by RT PCR 06/08/2021 NEGATIVE  NEGATIVE Final   Comment: (NOTE) SARS-CoV-2 target nucleic acids are NOT DETECTED.  The SARS-CoV-2 RNA is generally detectable in upper respiratory specimens during the acute phase of infection. The lowest concentration of SARS-CoV-2 viral copies this assay can detect is 138 copies/mL. A negative result does not preclude SARS-Cov-2 infection and should not be used as the sole basis for treatment or other patient management decisions. A negative result may occur with  improper specimen collection/handling, submission of specimen other than nasopharyngeal swab, presence of viral mutation(s) within the areas targeted by this assay, and inadequate number of viral copies(<138 copies/mL). A negative result must be combined with clinical observations, patient history, and epidemiological information. The expected result is Negative.  Fact Sheet for Patients:  EntrepreneurPulse.com.au  Fact Sheet for Healthcare Providers:   IncredibleEmployment.be  This test is no  t yet approved or cleared by the [place] and  has been authorized for detection and/or diagnosis of SARS-CoV-2 by FDA under an Emergency Use Authorization (EUA). This EUA will remain  in effect (meaning this test can be used) for the duration of the COVID-19 declaration under Section 564(b)(1) of the Act, 21 U.S.C.section 360bbb-3(b)(1), unless the authorization is terminated  or revoked sooner.       Influenza A by PCR 06/08/2021 NEGATIVE  NEGATIVE Final   Influenza B by PCR 06/08/2021 NEGATIVE  NEGATIVE Final   Comment: (NOTE) The Xpert Xpress SARS-CoV-2/FLU/RSV plus assay is intended as an aid in the diagnosis of influenza from Nasopharyngeal swab specimens and should not be used as a sole basis for treatment. Nasal washings and aspirates are unacceptable for Xpert Xpress SARS-CoV-2/FLU/RSV testing.  Fact Sheet for Patients: BloggerCourse.com  Fact Sheet for Healthcare Providers: SeriousBroker.it  This test is not yet approved or cleared by the [place] FDA and has been authorized for detection and/or diagnosis of SARS-CoV-2 by FDA under an Emergency Use Authorization (EUA). This EUA will remain in effect (meaning this test can be used) for the duration of the COVID-19 declaration under Section 564(b)(1) of the Act, 21 U.S.C. section 360bbb-3(b)(1), unless the authorization is terminated or revoked.  Performed at [facility], [address] [place], [place] [address]    WBC 06/08/2021 7.4  4.0 - 10.5 K/uL Final   RBC 06/08/2021 4.69  4.22 - 5.81 MIL/uL Final   Hemoglobin 06/08/2021 14.3  13.0 - 17.0 g/dL Final   HCT 53/66/4403 42.2  39.0 - 52.0 % Final   MCV 06/08/2021 90.0  80.0 - 100.0 fL Final   MCH 06/08/2021 30.5  26.0 - 34.0 pg Final   MCHC 06/08/2021 33.9  30.0 - 36.0 g/dL Final   RDW 47/42/5956 13.6  11.5  - 15.5 % Final   Platelets 06/08/2021 266  150 - 400 K/uL Final   nRBC 06/08/2021 0.0  0.0 - 0.2 % Final   Neutrophils Relative % 06/08/2021 62  % Final   Neutro Abs 06/08/2021 4.5  1.7 - 7.7 K/uL Final   Lymphocytes Relative 06/08/2021 30  % Final   Lymphs Abs 06/08/2021 2.2  0.7 - 4.0 K/uL Final   Monocytes Relative 06/08/2021 7  % Final   Monocytes Absolute 06/08/2021 0.5  0.1 - 1.0 K/uL Final   Eosinophils Relative 06/08/2021 1  % Final   Eosinophils Absolute 06/08/2021 0.1  0.0 - 0.5 K/uL Final   Basophils Relative 06/08/2021 0  % Final   Basophils Absolute 06/08/2021 0.0  0.0 - 0.1 K/uL Final   Immature Granulocytes 06/08/2021 0  % Final   Abs Immature Granulocytes 06/08/2021 0.02  0.00 - 0.07 K/uL Final   Performed at [facility], [address] [place], [place] [address]   Sodium 06/08/2021 138  135 - 145 mmol/L Final   Potassium 06/08/2021 3.6  3.5 - 5.1 mmol/L Final   Chloride 06/08/2021 101  98 - 111 mmol/L Final   CO2 06/08/2021 21 (L)  22 - 32 mmol/L Final   Glucose, Bld 06/08/2021 99  70 - 99 mg/dL Final   Glucose reference range applies only to samples taken after fasting for at least 8 hours.   BUN 06/08/2021 13  6 - 20 mg/dL Final   Creatinine, Ser 06/08/2021 0.81  0.61 - 1.24 mg/dL Final   Calcium 43/32/9518 9.0  8.9 - 10.3 mg/dL Final   Total  Protein 06/08/2021 7.0  6.5 - 8.1 g/dL Final   Albumin 16/10/960401/26/2023 4.2  3.5 - 5.0 g/dL Final   AST 54/09/811901/26/2023 31  15 - 41 U/L Final   ALT 06/08/2021 23  0 - 44 U/L Final   Alkaline Phosphatase 06/08/2021 63  38 - 126 U/L Final   Total Bilirubin 06/08/2021 0.8  0.3 - 1.2 mg/dL Final   GFR, Estimated 06/08/2021 >60  >60 mL/min Final   Comment: (NOTE) Calculated using the CKD-EPI Creatinine Equation (2021)    Anion gap 06/08/2021 16 (H)  5 - 15 Final   Performed at [facility], [address] [place], [place] [address]   Magnesium 06/08/2021 2.3  1.7 - 2.4 mg/dL Final   Performed at [facility],  [address] [place], [place] [address]   Alcohol, Ethyl (B) 06/08/2021 166 (H)  <10 mg/dL Final   Comment: (NOTE) Lowest detectable limit for serum alcohol is 10 mg/dL.  For medical purposes only. Performed at [facility], [address] [place], [place] [address]    Cholesterol 06/08/2021 227 (H)  0 - 200 mg/dL Final   Triglycerides 78/46/962901/26/2023 135  <150 mg/dL Final   HDL 52/84/132401/26/2023 96  >40 mg/dL Final   Total CHOL/HDL Ratio 06/08/2021 2.4  RATIO Final   VLDL 06/08/2021 27  0 - 40 mg/dL Final   LDL Cholesterol 06/08/2021 104 (H)  0 - 99 mg/dL Final   Comment:        Total Cholesterol/HDL:CHD Risk Coronary Heart Disease Risk Table                     Men   Women  1/2 Average Risk   3.4   3.3  Average Risk       5.0   4.4  2 X Average Risk   9.6   7.1  3 X Average Risk  23.4   11.0        Use the calculated Patient Ratio above and the CHD Risk Table to determine the patient's CHD Risk.        ATP III CLASSIFICATION (LDL):  <100     mg/dL   Optimal  401-027100-129  mg/dL   Near or Above                    Optimal  130-159  mg/dL   Borderline  [id number]  mg/dL   High  >403>190     mg/dL   Very High Performed at [facility], [address] [place], [place] [address]    TSH 06/08/2021 2.022  0.350 - 4.500 uIU/mL Final   Comment: Performed by a 3rd Generation assay with a functional sensitivity of <=0.01 uIU/mL. Performed at [facility], [address] [place], [place] [address]    Prolactin 06/08/2021 9.7  4.0 - 15.2 ng/mL Final   Comment: (NOTE) Performed At: [facility] [address] [place], [place] [id number] Jolene SchimkeNagendra Sanjai MD JO:[id number]    POC Amphetamine UR 06/08/2021 None Detected  NONE DETECTED (Cut Off Level 1000 ng/mL) Final   POC Secobarbital (BAR) 06/08/2021 None Detected  NONE DETECTED (Cut Off Level 300 ng/mL) Final   POC Buprenorphine (BUP) 06/08/2021 None Detected  NONE DETECTED (Cut Off Level 10 ng/mL) Final   POC Oxazepam (BZO)  06/08/2021 None Detected  NONE DETECTED (Cut Off Level 300 ng/mL) Final   POC Cocaine UR 06/08/2021 Positive (A)  NONE  DETECTED (Cut Off Level 300 ng/mL) Final   POC Methamphetamine UR 06/08/2021 None Detected  NONE DETECTED (Cut Off Level 1000 ng/mL) Final   POC Morphine 06/08/2021 None Detected  NONE DETECTED (Cut Off Level 300 ng/mL) Final   POC Oxycodone UR 06/08/2021 None Detected  NONE DETECTED (Cut Off Level 100 ng/mL) Final   POC Methadone UR 06/08/2021 None Detected  NONE DETECTED (Cut Off Level 300 ng/mL) Final   POC Marijuana UR 06/08/2021 Positive (A)  NONE DETECTED (Cut Off Level 50 ng/mL) Final  Admission on 04/26/2021, Discharged on 04/28/2021  Component Date Value Ref Range Status   SARS Coronavirus 2 by RT PCR 04/27/2021 NEGATIVE  NEGATIVE Final   Comment: (NOTE) SARS-CoV-2 target nucleic acids are NOT DETECTED.  The SARS-CoV-2 RNA is generally detectable in upper respiratory specimens during the acute phase of infection. The lowest concentration of SARS-CoV-2 viral copies this assay can detect is 138 copies/mL. A negative result does not preclude SARS-Cov-2 infection and should not be used as the sole basis for treatment or other patient management decisions. A negative result may occur with  improper specimen collection/handling, submission of specimen other than nasopharyngeal swab, presence of viral mutation(s) within the areas targeted by this assay, and inadequate number of viral copies(<138 copies/mL). A negative result must be combined with clinical observations, patient history, and epidemiological information. The expected result is Negative.  Fact Sheet for Patients:  EntrepreneurPulse.com.au  Fact Sheet for Healthcare Providers:  IncredibleEmployment.be  This test is no                          t yet approved or cleared by the [place] FDA and  has been authorized for detection and/or diagnosis of SARS-CoV-2  by FDA under an Emergency Use Authorization (EUA). This EUA will remain  in effect (meaning this test can be used) for the duration of the COVID-19 declaration under Section 564(b)(1) of the Act, 21 U.S.C.section 360bbb-3(b)(1), unless the authorization is terminated  or revoked sooner.       Influenza A by PCR 04/27/2021 NEGATIVE  NEGATIVE Final   Influenza B by PCR 04/27/2021 NEGATIVE  NEGATIVE Final   Comment: (NOTE) The Xpert Xpress SARS-CoV-2/FLU/RSV plus assay is intended as an aid in the diagnosis of influenza from Nasopharyngeal swab specimens and should not be used as a sole basis for treatment. Nasal washings and aspirates are unacceptable for Xpert Xpress SARS-CoV-2/FLU/RSV testing.  Fact Sheet for Patients: EntrepreneurPulse.com.au  Fact Sheet for Healthcare Providers: IncredibleEmployment.be  This test is not yet approved or cleared by the [place] FDA and has been authorized for detection and/or diagnosis of SARS-CoV-2 by FDA under an Emergency Use Authorization (EUA). This EUA will remain in effect (meaning this test can be used) for the duration of the COVID-19 declaration under Section 564(b)(1) of the Act, 21 U.S.C. section 360bbb-3(b)(1), unless the authorization is terminated or revoked.  Performed at [facility], [place] [address], [place], [place] [address]    SARS Coronavirus 2 Ag 04/27/2021 Negative  Negative Preliminary   WBC 04/27/2021 6.2  4.0 - 10.5 K/uL Final   RBC 04/27/2021 5.19  4.22 - 5.81 MIL/uL Final   Hemoglobin 04/27/2021 15.9  13.0 - 17.0 g/dL Final   HCT 04/27/2021 45.5  39.0 - 52.0 % Final   MCV 04/27/2021 87.7  80.0 - 100.0 fL Final   MCH 04/27/2021 30.6  26.0 - 34.0 pg Final   MCHC 04/27/2021  34.9  30.0 - 36.0 g/dL Final   RDW 04/27/2021 13.6  11.5 - 15.5 % Final   Platelets 04/27/2021 269  150 - 400 K/uL Final   nRBC 04/27/2021 0.0  0.0 - 0.2 % Final   Neutrophils Relative % 04/27/2021  34  % Final   Neutro Abs 04/27/2021 2.1  1.7 - 7.7 K/uL Final   Lymphocytes Relative 04/27/2021 55  % Final   Lymphs Abs 04/27/2021 3.4  0.7 - 4.0 K/uL Final   Monocytes Relative 04/27/2021 7  % Final   Monocytes Absolute 04/27/2021 0.4  0.1 - 1.0 K/uL Final   Eosinophils Relative 04/27/2021 3  % Final   Eosinophils Absolute 04/27/2021 0.2  0.0 - 0.5 K/uL Final   Basophils Relative 04/27/2021 1  % Final   Basophils Absolute 04/27/2021 0.0  0.0 - 0.1 K/uL Final   Immature Granulocytes 04/27/2021 0  % Final   Abs Immature Granulocytes 04/27/2021 0.01  0.00 - 0.07 K/uL Final   Performed at [facility], [place] [address], [place], [place] [address]   Sodium 04/27/2021 138  135 - 145 mmol/L Final   Potassium 04/27/2021 4.2  3.5 - 5.1 mmol/L Final   Chloride 04/27/2021 105  98 - 111 mmol/L Final   CO2 04/27/2021 24  22 - 32 mmol/L Final   Glucose, Bld 04/27/2021 89  70 - 99 mg/dL Final   Glucose reference range applies only to samples taken after fasting for at least 8 hours.   BUN 04/27/2021 13  6 - 20 mg/dL Final   Creatinine, Ser 04/27/2021 0.73  0.61 - 1.24 mg/dL Final   Calcium 04/27/2021 9.5  8.9 - 10.3 mg/dL Final   Total Protein 04/27/2021 6.6  6.5 - 8.1 g/dL Final   Albumin 04/27/2021 4.0  3.5 - 5.0 g/dL Final   AST 04/27/2021 38  15 - 41 U/L Final   ALT 04/27/2021 23  0 - 44 U/L Final   Alkaline Phosphatase 04/27/2021 89  38 - 126 U/L Final   Total Bilirubin 04/27/2021 0.8  0.3 - 1.2 mg/dL Final   GFR, Estimated 04/27/2021 >60  >60 mL/min Final   Comment: (NOTE) Calculated using the CKD-EPI Creatinine Equation (2021)    Anion gap 04/27/2021 9  5 - 15 Final   Performed at [place] [address] [place], [place] [address]   Hgb A1c MFr Bld 04/27/2021 5.7 (H)  4.8 - 5.6 % Final   Comment: (NOTE) Pre diabetes:          5.7%-6.4%  Diabetes:              >6.4%  Glycemic control for   <7.0% adults with diabetes    Mean Plasma Glucose 04/27/2021 116.89  mg/dL  Final   Performed at [facility], [place] [address], [place], [place] [address]   Alcohol, Ethyl (B) 04/27/2021 251 (H)  <10 mg/dL Final   Comment: (NOTE) Lowest detectable limit for serum alcohol is 10 mg/dL.  For medical purposes only. Performed at [facility], [place] [address] [place], [place] [address]    Cholesterol 04/27/2021 209 (H)  0 - 200 mg/dL Final   Triglycerides 04/27/2021 615 (H)  <150 mg/dL Final   HDL 04/27/2021 66  >40 mg/dL Final   Total CHOL/HDL Ratio 04/27/2021 3.2  RATIO Final   VLDL 04/27/2021 UNABLE TO CALCULATE IF TRIGLYCERIDE OVER 400 mg/dL  0 - 40 mg/dL Final   LDL Cholesterol 04/27/2021  UNABLE TO CALCULATE IF TRIGLYCERIDE OVER 400 mg/dL  0 - 99 mg/dL Final   Comment:        Total Cholesterol/HDL:CHD Risk Coronary Heart Disease Risk Table                     Men   Women  1/2 Average Risk   3.4   3.3  Average Risk       5.0   4.4  2 X Average Risk   9.6   7.1  3 X Average Risk  23.4   11.0        Use the calculated Patient Ratio above and the CHD Risk Table to determine the patient's CHD Risk.        ATP III CLASSIFICATION (LDL):  <100     mg/dL   Optimal  100-129  mg/dL   Near or Above                    Optimal  130-159  mg/dL   Borderline  160-189  mg/dL   High  >190     mg/dL   Very High Performed at [place] [address] [place], [place] [address]    TSH 04/27/2021 2.502  0.350 - 4.500 uIU/mL Final   Comment: Performed by a 3rd Generation assay with a functional sensitivity of <=0.01 uIU/mL. Performed at [facility], [place] [address], [place], [place] [address]    POC Amphetamine UR 04/27/2021 None Detected  NONE DETECTED (Cut Off Level 1000 ng/mL) Final   POC Secobarbital (BAR) 04/27/2021 None Detected  NONE DETECTED (Cut Off Level 300 ng/mL) Final   POC Buprenorphine (BUP) 04/27/2021 None Detected  NONE DETECTED (Cut Off Level 10 ng/mL) Final   POC Oxazepam (BZO) 04/27/2021 None Detected  NONE DETECTED (Cut Off  Level 300 ng/mL) Final   POC Cocaine UR 04/27/2021 None Detected  NONE DETECTED (Cut Off Level 300 ng/mL) Final   POC Methamphetamine UR 04/27/2021 None Detected  NONE DETECTED (Cut Off Level 1000 ng/mL) Final   POC Morphine 04/27/2021 None Detected  NONE DETECTED (Cut Off Level 300 ng/mL) Final   POC Oxycodone UR 04/27/2021 None Detected  NONE DETECTED (Cut Off Level 100 ng/mL) Final   POC Methadone UR 04/27/2021 None Detected  NONE DETECTED (Cut Off Level 300 ng/mL) Final   POC Marijuana UR 04/27/2021 None Detected  NONE DETECTED (Cut Off Level 50 ng/mL) Final   SARSCOV2ONAVIRUS 2 AG 04/27/2021 NEGATIVE  NEGATIVE Final   Comment: (NOTE) SARS-CoV-2 antigen NOT DETECTED.   Negative results are presumptive.  Negative results do not preclude SARS-CoV-2 infection and should not be used as the sole basis for treatment or other patient management decisions, including infection  control decisions, particularly in the presence of clinical signs and  symptoms consistent with COVID-19, or in those who have been in contact with the virus.  Negative results must be combined with clinical observations, patient history, and epidemiological information. The expected result is Negative.  Fact Sheet for Patients: HandmadeRecipes.com.cy  Fact Sheet for Healthcare Providers: FuneralLife.at  This test is not yet approved or cleared by the [place] FDA and  has been authorized for detection and/or diagnosis of SARS-CoV-2 by FDA under an Emergency Use Authorization (EUA).  This EUA will remain in effect (meaning this test can be used) for the duration of  the COV  ID-19 declaration under Section 564(b)(1) of the Act, 21 U.S.C. section 360bbb-3(b)(1), unless the authorization is terminated or revoked sooner.     Direct LDL 04/27/2021 103.4 (H)  0 - 99 mg/dL Final   Performed at [facility] [address]  [place], [place] [address]    Blood Alcohol level:  Lab Results  Component Value Date   ETH 166 (H) 06/08/2021   ETH 251 (H) [id number]    Metabolic Disorder Labs: Lab Results  Component Value Date   HGBA1C 5.7 (H) 04/27/2021   MPG 116.89 04/27/2021   MPG 117 04/17/2009   Lab Results  Component Value Date   PROLACTIN 9.7 06/08/2021   Lab Results  Component Value Date   CHOL 227 (H) 06/08/2021   TRIG 135 06/08/2021   HDL 96 06/08/2021   CHOLHDL 2.4 06/08/2021   VLDL 27 06/08/2021   LDLCALC 104 (H) 06/08/2021   LDLCALC UNABLE TO CALCULATE IF TRIGLYCERIDE OVER 400 mg/dL 04/27/2021    Therapeutic Lab Levels: No results found for: LITHIUM No results found for: VALPROATE No components found for:  CBMZ  Physical Findings   GAD-7    Flowsheet Row Office Visit from 06/14/2020 in [place] Office Visit from 05/27/2020 in [place]  Total GAD-7 Score 11 9      PHQ2-9    Flowsheet Row ED from 04/26/2021 in [facility] Office Visit from 06/14/2020 in [place] Office Visit from 05/27/2020 in [place] [facility] from [facility]2017 [facility] from 07/23/2017 in [facility]  PHQ-2 Total Score 4 2 2  0 0  PHQ-9 Total Score 19 15 11  -- --      Flowsheet Row ED from 06/08/2021 in [facility] ED from 04/26/2021 in [facility] ED from 10/02/2020 in [place] Error: Q7 should not be populated when Q6 is No No Risk No Risk        Musculoskeletal  Strength & Muscle Tone: within normal limits Gait & Station: normal Patient leans: N/A  Psychiatric Specialty Exam  Presentation  General Appearance: Appropriate for Environment;  Casual  Eye Contact:Good  Speech:Clear and Coherent; Normal Rate  Speech Volume:Normal  Handedness:Right   Mood and Affect  Mood:-- ("feeling rough")  Affect:Appropriate; Congruent   Thought Process  Thought Processes:Coherent; Goal Directed; Linear  Descriptions of Associations:Intact  Orientation:Full (Time, Place and Person)  Thought Content:WDL; Logical  Diagnosis of Schizophrenia or Schizoaffective disorder in past: No    Hallucinations:Hallucinations: None  Ideas of Reference:None  Suicidal Thoughts:Suicidal Thoughts: No  Homicidal Thoughts:Homicidal Thoughts: No   Sensorium  Memory:Immediate Good; Remote Good; Recent Good  Judgment:Fair  Insight:Fair   Executive Functions  Concentration:Good  Attention Span:Good  Recall:Good  Fund of Knowledge:Good  Language:Good   Psychomotor Activity  Psychomotor Activity:Psychomotor Activity: Normal   Assets  Assets:Communication Skills; Desire for Improvement; Talents/Skills; Resilience; Physical Health   Sleep  Sleep:Sleep: Poor   Nutritional Assessment (For OBS and FBC admissions only) Has the patient had a weight loss or gain of 10 pounds or more in the last 3 months?: No Has the patient had a decrease in food intake/or appetite?: No Does the patient have dental problems?: No Does the patient have eating habits or behaviors that may be indicators of an eating disorder  including binging or inducing vomiting?: No Has the patient recently lost weight without trying?: 0 Has the patient been eating poorly because of a decreased appetite?: 0 Malnutrition Screening Tool Score: 0    Physical Exam  Physical Exam Constitutional:      Appearance: Normal appearance. He is normal weight.  HENT:     Head: Normocephalic and atraumatic.  Eyes:     Extraocular Movements: Extraocular movements intact.  Pulmonary:     Effort: Pulmonary effort is normal.  Neurological:     General: No focal deficit  present.     Mental Status: He is alert and oriented to person, place, and time.     Comments: Mild tremor in Bilateral hands with outstretched arms  Psychiatric:        Attention and Perception: Attention and perception normal.        Speech: Speech normal.        Behavior: Behavior normal. Behavior is cooperative.        Thought Content: Thought content normal.   Review of Systems  Constitutional:  Negative for chills and fever.  HENT:  Negative for hearing loss.   Eyes:  Negative for discharge and redness.  Respiratory:  Negative for cough.   Cardiovascular:  Negative for chest pain.  Gastrointestinal:  Positive for nausea. Negative for abdominal pain.  Musculoskeletal:  Negative for myalgias.  Neurological:  Positive for headaches.  Psychiatric/Behavioral:  Positive for substance abuse. Negative for depression and suicidal ideas. The patient has insomnia.   Blood pressure 124/74, pulse 74, temperature 97.8 F (36.6 C), temperature source Tympanic, resp. rate 16, SpO2 98 %. There is no height or weight on file to calculate BMI.  Treatment Plan Summary: 46 year old male with history of substance use disorder, alcohol use disorder, MDD,  cocaine abuse who presented to the [facility] on 1/26 for assistance with alcohol detox and was admitted to the [facility] for alcohol detox and placed on CIWA protocol and restarted on gabapentin. Etoh 166; UDS+cocaine and marijuana  Patient reports ongoing withdrawal symptoms of nausea, tremor, jitteriness, and headache; most recent CIWA 0 and VSS. Patient remains appropriate for admission to the [facility] for continued alcohol detox.   Alcohol Use Disorder, severe -continue CIWA protocol -multivitamin -thiamine -continue gabapentin 300 mg TID -PRNs for alcohol withdrawal  Dispo: pending symptomatic improvement. Anticipate discharge to rehabilitation facility upon completion of alcohol detox if able to move court dates. If unable to move court dates; may need to  consider alternatives such as sober living. SW assisting  Ival Bible, MD 06/09/2021 11:51 AM

## 2021-06-09 NOTE — ED Notes (Signed)
Patient refused breakfast 

## 2021-06-09 NOTE — Progress Notes (Signed)
Pt's CIWA was 8.

## 2021-06-09 NOTE — ED Notes (Signed)
Patient refused to come to Group, gave him Group papers on Cognitive Distortions, explained we discussed coping skills , anxiety, Personal Boundaries, Healthy Communication, and Triggers, he is to read and fill out the necessary parts and  return today I will then give the paperwork back to him.

## 2021-06-09 NOTE — ED Notes (Signed)
Pt is currently sleeping, no distress noted, environmental check complete, will continue to monitor patient for safety. ? ?

## 2021-06-09 NOTE — Progress Notes (Signed)
Pt is presently resting quietly. Pt complained of general body ache and reported withdrawal symptoms. No signs of acute distress noted. Administered PRN Tylenol and scheduled meds with no incident. Pt denies current SI/HI/AVH. Staff will monitor for pt's safety.

## 2021-06-09 NOTE — Progress Notes (Signed)
Pt is asleep. Respirations are even and unlabored. No distress noted. Monitoring for pt's safety.

## 2021-06-09 NOTE — ED Notes (Signed)
Pt was tired and wanted to go to bed as soon as he came over bc he had been here for 17 hours aand so the EKG wasn't done.

## 2021-06-09 NOTE — ED Notes (Signed)
Patient resting well with no sxs of distress - - will continue to assess CIWAs and monitor patient for safeth

## 2021-06-09 NOTE — Progress Notes (Signed)
Pt had dinner and is currently sleeping. Respirations are even and unlabored. No distress noted. Pt's safety is maintained.

## 2021-06-09 NOTE — ED Notes (Signed)
Pt came to nurses station complaining of withdrawal type symptoms. This nurse did a CIWA on the pt and pt is scoring at a 12. Medications will be administered. Pt also asked for a snack. [place] its were given to patient.

## 2021-06-10 DIAGNOSIS — F1010 Alcohol abuse, uncomplicated: Secondary | ICD-10-CM | POA: Diagnosis not present

## 2021-06-10 DIAGNOSIS — Z20822 Contact with and (suspected) exposure to covid-19: Secondary | ICD-10-CM | POA: Diagnosis not present

## 2021-06-10 DIAGNOSIS — F1410 Cocaine abuse, uncomplicated: Secondary | ICD-10-CM | POA: Diagnosis not present

## 2021-06-10 MED ORDER — GABAPENTIN 300 MG PO CAPS
300.0000 mg | ORAL_CAPSULE | Freq: Three times a day (TID) | ORAL | 0 refills | Status: DC
Start: 2021-06-10 — End: 2021-07-28

## 2021-06-10 MED ORDER — HYDROXYZINE HCL 25 MG PO TABS: 25.0000 mg | ORAL_TABLET | Freq: Three times a day (TID) | ORAL | 0 refills | Status: DC | PRN

## 2021-06-10 MED ORDER — TRAZODONE HCL 50 MG PO TABS
50.0000 mg | ORAL_TABLET | Freq: Every evening | ORAL | 0 refills | Status: DC | PRN
Start: 2021-06-10 — End: 2021-07-28

## 2021-06-10 NOTE — Group Note (Signed)
Group Topic: Emotional Regulation  Group Date: 06/10/2021 Start Time: 1235 End Time: 1310 Facilitators: Laury Axon E  Department: [facility]  Number of Participants: 2  Group Focus: activities of daily living skills and anger management Treatment Modality:  Behavior Modification Therapy and Individual Therapy Interventions utilized were group exercise and patient education Purpose: enhance coping skills, express feelings, and trigger / craving management  Name: Brendan Ochoa Date of Birth: 08-27-1975  MR: [id number]    Level of Participation: active Quality of Participation: attentive and cooperative Interactions with others: gave feedback Mood/Affect: appropriate Triggers (if applicable): n/a Cognition: coherent/clear and concrete Progress: Moderate Response: n/a Plan: follow-up needed  Patients Problems:  Patient Active Problem List   Diagnosis Date Noted   Alcohol use disorder, severe, dependence ([place])    Cocaine use disorder, severe, dependence ([place])    Cannabis use disorder, severe, dependence ([place])    Alcohol abuse with withdrawal ([place]) 04/27/2021   Acute encephalopathy    Hypokalemia    Tobacco abuse    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   Epididymal cyst 06/14/2020   Unintentional weight loss 06/14/2020   Abnormal chest x-ray 06/14/2020   Elevated blood pressure reading without diagnosis of hypertension 05/27/2020   Influenza vaccination declined 05/27/2020   AKI (acute kidney injury) ([place]) 01/01/2020   Vomiting 01/01/2020   Cocaine abuse ([place]) 01/01/2020   Alcohol abuse 01/01/2020   Ileitis 02/18/2018   HYPERGLYCEMIA 11/01/2009   Tobacco dependence 07/18/2007   SUBSTANCE ABUSE, MULTIPLE 07/18/2007   ALLERGIC RHINITIS 07/18/2007   ASTHMA 07/18/2007   GERD 07/18/2007   Crohn's disease ([place])  07/18/2007

## 2021-06-10 NOTE — ED Notes (Signed)
Pt discharged in no acute distress. Denied SI/HI/AVH upon discharge. Verbalized understanding of printed discharge instructions explained by staff. Pt received all belongings intact from locker #5. Pt escorted to lobby with AVS in hand, along with RX's and a bus pass. Safety maintained.

## 2021-06-10 NOTE — ED Provider Notes (Signed)
FBC/OBS ASAP Discharge Summary  Date and Time: 06/10/2021 6:17 PM  Name: Brendan Ochoa  MRN:  [id number]   Discharge Diagnoses:  Final diagnoses:  Alcohol abuse  Cocaine abuse [facility])    Subjective:   Brendan Ochoa, 46 y.o., male patient who initially presented to [facility] on 06/08/2021 for assistance with alcohol detox.  He was admitted to the [facility] and placed on CIWA protocol, and gabapentin was restarted.  On admission his ETOH 166 and UDS positive for cocaine and marijuana.  Per chart review patient was recently admitted to the [facility] in December 2022 with similar presentation.  He has a history of substance use, alcohol use, MDD, and cocaine use.  He has an upcoming court date on 06/25/2021 and 06/15/2021 for driving with a revoked license.   Patient seen face to face by this provider, consulted with Dr. Dwyane Dee; and  chart reviewed on 06/10/21.     During evaluation Brendan Ochoa is sitting in the day room eating his breakfast.  He is alert/oriented x4.  He is cooperative.  He endorses depression and anxiety with congruent affect.  Reports his main stressor for his depression is the death of his girlfriend a few weeks ago due to an accidental overdose. States his depression caused him to relapse on alcohol and he was unable to maintain his sobriety. He also contributes living in the [place] a trigger for his relapse.  States everyone there "is using something its all around you".  There is no indication that he is currently responding to internal/external stimuli.  He denies AVH/HI/SI.  He denies access to firearms/weapons.  He contracts for safety.  Patient answered questions appropriately.  Reassurance and encouragement provided.    He is tolerating medications without any adverse reactions.  He complains of withdrawal symptoms that include tremor, nausea, and headache.  However he reports his symptoms are improved since his admission. He has been trying to reach his attorney about  rescheduling his upcoming court dates but has been unsuccessful.   Patient initially wanted to stay until Monday and discuss residential treatment with Education officer, museum.  However he changed his mind and requested outpatient substance abuse resources.  States he is ready to return home.  He plans to live with his mother   Stay Summary:   Patient remained calm and cooperative while on the unit.  He and her acted with staff and other residents appropriately.  He exhibited no unsafe behaviors while on the unit.  He will be discharged with resources for outpatient substance abuse treatment.  Total Time spent with patient: 30 minutes  Past Psychiatric History: See see H&P Past Medical History:  Past Medical History:  Diagnosis Date   Asthma    Cocaine abuse ([place])    in past   Crohn disease ([place]) 2008   Depression    in past, not current as of 2019    ETOH abuse    history of alcohol use   GERD (gastroesophageal reflux disease)     Past Surgical History:  Procedure Laterality Date   BIOPSY  10/15/2017   Procedure: BIOPSY;  Surgeon: Danie Binder, MD;  Location: [facility] ENDO SUITE;  Service: Endoscopy;;  terminal ileum colon   COLONOSCOPY WITH PROPOFOL N/A 10/15/2017   redundant colon, normal rectum, external and internal hemorrhoids, quiescent crohn's. Pathology: inactive chronicn nonspecific ileitis   ESOPHAGOGASTRODUODENOSCOPY (EGD) WITH PROPOFOL N/A 10/15/2017   normal esophagus, small hiatal hernia, gastritis, duodenitis   FINGER AMPUTATION Right  2012   cut finger table saw and surgically amputated   Family History:  Family History  Problem Relation Age of Onset   Stomach cancer Maternal Grandfather    Colon cancer Neg Hx    Family Psychiatric History: See H&P Social History:  Social History   Substance and Sexual Activity  Alcohol Use Not Currently   Comment: alcoholic- last use [id number]     Social History   Substance and Sexual Activity  Drug Use Not Currently   Types:  Cocaine, Marijuana, Methamphetamines   Comment: last cocaine use 10/10/20.  last meth april 2022    Social History   Socioeconomic History   Marital status: Single    Spouse name: Not on file   Number of children: Not on file   Years of education: Not on file   Highest education level: Not on file  Occupational History   Not on file  Tobacco Use   Smoking status: Every Day    Packs/day: 0.50    Years: 15.00    Pack years: 7.50    Types: Cigarettes   Smokeless tobacco: Never   Tobacco comments:    1/4 to 1/2 pack per day   Vaping Use   Vaping Use: Never used  Substance and Sexual Activity   Alcohol use: Not Currently    Comment: alcoholic- last use [id number]   Drug use: Not Currently    Types: Cocaine, Marijuana, Methamphetamines    Comment: last cocaine use 10/10/20.  last meth april 2022   Sexual activity: Not on file  Other Topics Concern   Not on file  Social History Narrative   Not on file   Social Determinants of Health   Financial Resource Strain: Not on file  Food Insecurity: Not on file  Transportation Needs: Not on file  Physical Activity: Not on file  Stress: Not on file  Social Connections: Not on file   SDOH:  SDOH Screenings   Alcohol Screen: Not on file  Depression (PHQ2-9): Medium Risk   PHQ-2 Score: 19  Financial Resource Strain: Not on file  Food Insecurity: Not on file  Housing: Not on file  Physical Activity: Not on file  Social Connections: Not on file  Stress: Not on file  Tobacco Use: High Risk   Smoking Tobacco Use: Every Day   Smokeless Tobacco Use: Never   Passive Exposure: Not on file  Transportation Needs: Not on file    Tobacco Cessation:  A prescription for an FDA-approved tobacco cessation medication was offered at discharge and the patient refused  Current Medications:  Current Facility-Administered Medications  Medication Dose Route Frequency Provider Last Rate Last Admin   acetaminophen (TYLENOL) tablet 650 mg  650 mg  Oral Q6H PRN Lucky Rathke, FNP   650 mg at 06/10/21 0959   albuterol (VENTOLIN HFA) 108 (90 Base) MCG/ACT inhaler 2 puff  2 puff Inhalation Q6H PRN Lucky Rathke, FNP       alum & mag hydroxide-simeth (MAALOX/MYLANTA) 200-200-20 MG/5ML suspension 30 mL  30 mL Oral Q4H PRN Lucky Rathke, FNP   30 mL at 06/10/21 1630   gabapentin (NEURONTIN) capsule 300 mg  300 mg Oral TID Lucky Rathke, FNP   300 mg at 06/10/21 1630   hydrOXYzine (ATARAX) tablet 25 mg  25 mg Oral TID PRN Lucky Rathke, FNP   25 mg at 06/10/21 1100   ibuprofen (ADVIL) tablet 400 mg  400 mg Oral Q4H PRN Ival Bible, MD  loperamide (IMODIUM) capsule 2-4 mg  2-4 mg Oral PRN Lucky Rathke, FNP       LORazepam (ATIVAN) tablet 1 mg  1 mg Oral Q6H PRN Lucky Rathke, FNP   1 mg at 06/09/21 2053   magnesium hydroxide (MILK OF MAGNESIA) suspension 30 mL  30 mL Oral Daily PRN Lucky Rathke, FNP       multivitamin with minerals tablet 1 tablet  1 tablet Oral Daily Lucky Rathke, FNP   1 tablet at 06/10/21 0959   ondansetron (ZOFRAN-ODT) disintegrating tablet 4 mg  4 mg Oral Q6H PRN Lucky Rathke, FNP   4 mg at 06/09/21 2053   thiamine tablet 100 mg  100 mg Oral Daily Lucky Rathke, FNP   100 mg at 06/10/21 0959   traZODone (DESYREL) tablet 50 mg  50 mg Oral QHS PRN Lucky Rathke, FNP   50 mg at 06/09/21 2153   Current Outpatient Medications  Medication Sig Dispense Refill   albuterol (VENTOLIN HFA) 108 (90 Base) MCG/ACT inhaler Inhale 2 puffs into the lungs every 6 (six) hours as needed for wheezing or shortness of breath. 8 g 0   Multiple Vitamin (MULTIVITAMIN WITH MINERALS) TABS tablet Take 1 tablet by mouth daily. 30 tablet 0    PTA Medications: (Not in a hospital admission)   Musculoskeletal  Strength & Muscle Tone: within normal limits Gait & Station: normal Patient leans: N/A  Psychiatric Specialty Exam  Presentation  General Appearance: Appropriate for Environment; Casual  Eye Contact:Good  Speech:Clear and  Coherent; Normal Rate  Speech Volume:Normal  Handedness:Right   Mood and Affect  Mood:Anxious; Depressed  Affect:Congruent   Thought Process  Thought Processes:Coherent  Descriptions of Associations:Intact  Orientation:Full (Time, Place and Person)  Thought Content:Logical  Diagnosis of Schizophrenia or Schizoaffective disorder in past: No    Hallucinations:Hallucinations: None  Ideas of Reference:None  Suicidal Thoughts:Suicidal Thoughts: No  Homicidal Thoughts:Homicidal Thoughts: No   Sensorium  Memory:Immediate Good; Recent Good; Remote Good  Judgment:Fair  Insight:Fair   Executive Functions  Concentration:Good  Attention Span:Fair; Good  Recall:Good  Fund of Knowledge:Good  Language:Good   Psychomotor Activity  Psychomotor Activity:Psychomotor Activity: Normal   Assets  Assets:Communication Skills; Desire for Improvement; Financial Resources/Insurance; Physical Health; Resilience   Sleep  Sleep:Sleep: Fair   No data recorded  Physical Exam  Physical Exam Vitals and nursing note reviewed.  Constitutional:      Appearance: Normal appearance. He is well-developed.  HENT:     Head: Normocephalic and atraumatic.  Eyes:     Conjunctiva/sclera: Conjunctivae normal.  Cardiovascular:     Rate and Rhythm: Normal rate and regular rhythm.     Heart sounds: No murmur heard. Pulmonary:     Effort: Pulmonary effort is normal. No respiratory distress.  Musculoskeletal:        General: Normal range of motion.     Cervical back: Normal range of motion.  Skin:    Coloration: Skin is not jaundiced or pale.  Neurological:     Mental Status: He is alert and oriented to person, place, and time.  Psychiatric:        Attention and Perception: Attention and perception normal.        Mood and Affect: Mood is anxious.        Speech: Speech normal.        Behavior: Behavior normal. Behavior is cooperative.        Thought Content: Thought content  normal.  Cognition and Memory: Cognition normal.        Judgment: Judgment normal.   Review of Systems  Constitutional: Negative.   HENT: Negative.    Eyes: Negative.   Respiratory: Negative.    Cardiovascular: Negative.   Gastrointestinal: Negative.   Genitourinary: Negative.   Musculoskeletal: Negative.   Skin: Negative.   Neurological: Negative.   Endo/Heme/Allergies: Negative.   Psychiatric/Behavioral:  The patient is nervous/anxious.   Blood pressure 123/81, pulse 79, temperature 98.7 F (37.1 C), temperature source Oral, resp. rate 18, SpO2 98 %. There is no height or weight on file to calculate BMI.  Demographic Factors:  Male, Caucasian, Low socioeconomic status, and Unemployed  Loss Factors: Legal issues and Financial problems/change in socioeconomic status  Historical Factors: Impulsivity  Risk Reduction Factors:   Sense of responsibility to family, Positive therapeutic relationship, and Positive coping skills or problem solving skills  Continued Clinical Symptoms:  Severe Anxiety and/or Agitation Depression:   Comorbid alcohol abuse/dependence Impulsivity Alcohol/Substance Abuse/Dependencies  Cognitive Features That Contribute To Risk:  None    Suicide Risk:  Minimal: No identifiable suicidal ideation.  Patients presenting with no risk factors but with morbid ruminations; may be classified as minimal risk based on the severity of the depressive symptoms  Plan Of Care/Follow-up recommendations:  Activity:  as tolerated  Diet:  regular  Disposition:   Discharge patient  Printed prescription for gabapentin 300 mg 3 times daily, hydroxyzine 25 mg 3 times daily as needed, and trazodone 50 mg nightly as needed will be provided per patient request.  Outpatient resources for substance abuse treatment provided on AVS. Revonda Humphrey, NP 06/10/2021, 6:17 PM

## 2021-06-10 NOTE — ED Notes (Signed)
Patient resting well with no sxs o

## 2021-06-10 NOTE — ED Notes (Signed)
Pt resting in his assigned room on the bed. No s&s of distress. Safety maintained and will continue to monitor.

## 2021-06-10 NOTE — ED Notes (Signed)
Participated in the afternoon group, verbalized understanding the handout worksheet given

## 2021-06-10 NOTE — ED Provider Notes (Addendum)
Behavioral Health Progress Note  Date and Time: 06/10/2021 10:44 AM Name: PRADEEP KEIRSEY MRN:  [id number]  Subjective:     Brendan Ochoa, 46 y.o., male patient who [facility] for assistance with alcohol detox.  He was admitted to the [facility] and placed on CIWA protocol, and gabapentin was restarted.  On admission his ETOH 166 and UDS positive for cocaine and marijuana.  Per chart review patient was recently admitted to the [facility] in December 2022 with similar presentation.  He has a history of substance use, alcohol use, MDD, and cocaine use.  He has an upcoming court date on 06/25/2021 and 06/15/2021 for driving with a revoked license.  Patient seen face to face by this provider, consulted with Dr. Dwyane Dee; and  chart reviewed on 06/10/21.    During evaluation Hyden Thayn Whiteaker is sitting in the day room eating his breakfast.  He is alert/oriented x4.  He is cooperative.  He endorses depression and anxiety with congruent affect.  Reports his main stressor for his depression is the death of his girlfriend a few weeks ago due to an accidental overdose. States his depression caused him to relapse on alcohol and he was unable to maintain his sobriety. He also contributes living in the [place] a trigger for his relapse.  States everyone there "is using something its all around you".  There is no indication that he is currently responding to internal/external stimuli.  He denies AVH/HI/SI. Patient answered questions appropriately.  Reassurance and encouragement provided.   He is tolerating medications without any adverse reactions.  He complains of withdrawal symptoms that include tremor, nausea, and headache.  However he reports his symptoms are improved since his admission. He would like to talk to the social worker on Monday about possible residential treatment.  He has been trying to reach his attorney about rescheduling his upcoming court dates but has been  unsuccessful.  Reports he will continue to reach out but it would most likely be Monday before he can actually make contact with the attorney.    Diagnosis:  Final diagnoses:  Alcohol abuse  Cocaine abuse ([place])    Total Time spent with patient: 20 minutes  Past Psychiatric History: see h&p Past Medical History:  Past Medical History:  Diagnosis Date   Asthma    Cocaine abuse ([place])    in past   Crohn disease ([place]) 2008   Depression    in past, not current as of 2019    ETOH abuse    history of alcohol use   GERD (gastroesophageal reflux disease)     Past Surgical History:  Procedure Laterality Date   BIOPSY  10/15/2017   Procedure: BIOPSY;  Surgeon: Danie Binder, MD;  Location: [facility];  Service: Endoscopy;;  terminal ileum colon   COLONOSCOPY WITH PROPOFOL N/A 10/15/2017   redundant colon, normal rectum, external and internal hemorrhoids, quiescent crohn's. Pathology: inactive chronicn nonspecific ileitis   ESOPHAGOGASTRODUODENOSCOPY (EGD) WITH PROPOFOL N/A 10/15/2017   normal esophagus, small hiatal hernia, gastritis, duodenitis   FINGER AMPUTATION Right 2012   cut finger table saw and surgically amputated   Family History:  Family History  Problem Relation Age of Onset   Stomach cancer Maternal Grandfather    Colon cancer Neg Hx    Family Psychiatric  History: see h&p Social History:  Social History   Substance and Sexual Activity  Alcohol Use Not Currently   Comment: alcoholic- last use [id number]  Social History   Substance and Sexual Activity  Drug Use Not Currently   Types: Cocaine, Marijuana, Methamphetamines   Comment: last cocaine use 10/10/20.  last meth april 2022    Social History   Socioeconomic History   Marital status: Single    Spouse name: Not on file   Number of children: Not on file   Years of education: Not on file   Highest education level: Not on file  Occupational History   Not on file  Tobacco Use   Smoking status: Every  Day    Packs/day: 0.50    Years: 15.00    Pack years: 7.50    Types: Cigarettes   Smokeless tobacco: Never   Tobacco comments:    1/4 to 1/2 pack per day   Vaping Use   Vaping Use: Never used  Substance and Sexual Activity   Alcohol use: Not Currently    Comment: alcoholic- last use [id number]   Drug use: Not Currently    Types: Cocaine, Marijuana, Methamphetamines    Comment: last cocaine use 10/10/20.  last meth april 2022   Sexual activity: Not on file  Other Topics Concern   Not on file  Social History Narrative   Not on file   Social Determinants of Health   Financial Resource Strain: Not on file  Food Insecurity: Not on file  Transportation Needs: Not on file  Physical Activity: Not on file  Stress: Not on file  Social Connections: Not on file   SDOH:  SDOH Screenings   Alcohol Screen: Not on file  Depression (PHQ2-9): Medium Risk   PHQ-2 Score: 19  Financial Resource Strain: Not on file  Food Insecurity: Not on file  Housing: Not on file  Physical Activity: Not on file  Social Connections: Not on file  Stress: Not on file  Tobacco Use: High Risk   Smoking Tobacco Use: Every Day   Smokeless Tobacco Use: Never   Passive Exposure: Not on file  Transportation Needs: Not on file   Additional Social History:    Pain Medications: See MAR Over the Counter: See MAR History of alcohol / drug use?: Yes Longest period of sobriety (when/how long): 2 years sober approximately 10 years ago Negative Consequences of Use: Personal relationships, Legal, Financial Withdrawal Symptoms: Agitation, Sweats, Weakness, Nausea / Vomiting, Irritability, Fever / Chills, DTs, Patient aware of relationship between substance abuse and physical/medical complications, Tremors Name of Substance 1: EtOH 1 - Age of First Use: Unknown 1 - Amount (size/oz): 15 beers daily 1 - Frequency: Daily 1 - Duration: Unknown 1 - Last Use / Amount: Today 1 - Method of Aquiring: Purchase 1- Route of  Use: oral Name of Substance 2: cocaine 2 - Age of First Use: unknown 2 - Amount (size/oz): varies usually 1 gram 2 - Frequency: past 6-7 weeks 2 - Duration: Ongoing 2 - Last Use / Amount: unknown 2 - Method of Aquiring: off the street 2 - Route of Substance Use: snort Name of Substance 3: marijuana 3 - Age of First Use: dailyx 6-7 weeks 3 - Amount (size/oz): 2 grams 3 - Frequency: daily 3 - Duration: unknown 3 - Last Use / Amount: unknown 3 - Method of Aquiring: off the street 3 - Route of Substance Use: smoke      Sleep: Fair  Appetite:  Fair  Current Medications:  Current Facility-Administered Medications  Medication Dose Route Frequency Provider Last Rate Last Admin   acetaminophen (TYLENOL) tablet 650 mg  650  mg Oral Q6H PRN Lucky Rathke, FNP   650 mg at 06/10/21 0959   albuterol (VENTOLIN HFA) 108 (90 Base) MCG/ACT inhaler 2 puff  2 puff Inhalation Q6H PRN Lucky Rathke, FNP       alum & mag hydroxide-simeth (MAALOX/MYLANTA) 200-200-20 MG/5ML suspension 30 mL  30 mL Oral Q4H PRN Lucky Rathke, FNP       gabapentin (NEURONTIN) capsule 300 mg  300 mg Oral TID Lucky Rathke, FNP   300 mg at 06/10/21 [id number]   hydrOXYzine (ATARAX) tablet 25 mg  25 mg Oral TID PRN Lucky Rathke, FNP       ibuprofen (ADVIL) tablet 400 mg  400 mg Oral Q4H PRN Ival Bible, MD       loperamide (IMODIUM) capsule 2-4 mg  2-4 mg Oral PRN Lucky Rathke, FNP       LORazepam (ATIVAN) tablet 1 mg  1 mg Oral Q6H PRN Lucky Rathke, FNP   1 mg at 06/09/21 2053   magnesium hydroxide (MILK OF MAGNESIA) suspension 30 mL  30 mL Oral Daily PRN Lucky Rathke, FNP       multivitamin with minerals tablet 1 tablet  1 tablet Oral Daily Lucky Rathke, FNP   1 tablet at 06/10/21 0959   ondansetron (ZOFRAN-ODT) disintegrating tablet 4 mg  4 mg Oral Q6H PRN Lucky Rathke, FNP   4 mg at 06/09/21 2053   thiamine tablet 100 mg  100 mg Oral Daily Lucky Rathke, FNP   100 mg at 06/10/21 0959   traZODone (DESYREL) tablet  50 mg  50 mg Oral QHS PRN Lucky Rathke, FNP   50 mg at 06/09/21 2153   Current Outpatient Medications  Medication Sig Dispense Refill   albuterol (VENTOLIN HFA) 108 (90 Base) MCG/ACT inhaler Inhale 2 puffs into the lungs every 6 (six) hours as needed for wheezing or shortness of breath. 8 g 0   Multiple Vitamin (MULTIVITAMIN WITH MINERALS) TABS tablet Take 1 tablet by mouth daily. 30 tablet 0    Labs  Lab Results:  Admission on 06/08/2021  Component Date Value Ref Range Status   SARS Coronavirus 2 by RT PCR 06/08/2021 NEGATIVE  NEGATIVE Final   Comment: (NOTE) SARS-CoV-2 target nucleic acids are NOT DETECTED.  The SARS-CoV-2 RNA is generally detectable in upper respiratory specimens during the acute phase of infection. The lowest concentration of SARS-CoV-2 viral copies this assay can detect is 138 copies/mL. A negative result does not preclude SARS-Cov-2 infection and should not be used as the sole basis for treatment or other patient management decisions. A negative result may occur with  improper specimen collection/handling, submission of specimen other than nasopharyngeal swab, presence of viral mutation(s) within the areas targeted by this assay, and inadequate number of viral copies(<138 copies/mL). A negative result must be combined with clinical observations, patient history, and epidemiological information. The expected result is Negative.  Fact Sheet for Patients:  EntrepreneurPulse.com.au  Fact Sheet for Healthcare Providers:  IncredibleEmployment.be  This test is no                          t yet approved or cleared by the [place] FDA and  has been authorized for detection and/or diagnosis of SARS-CoV-2 by FDA under an Emergency Use Authorization (EUA). This EUA will remain  in effect (meaning this test can be used) for the duration of the COVID-19 declaration  under Section 564(b)(1) of the Act, 21 U.S.C.section  360bbb-3(b)(1), unless the authorization is terminated  or revoked sooner.       Influenza A by PCR 06/08/2021 NEGATIVE  NEGATIVE Final   Influenza B by PCR 06/08/2021 NEGATIVE  NEGATIVE Final   Comment: (NOTE) The Xpert Xpress SARS-CoV-2/FLU/RSV plus assay is intended as an aid in the diagnosis of influenza from Nasopharyngeal swab specimens and should not be used as a sole basis for treatment. Nasal washings and aspirates are unacceptable for Xpert Xpress SARS-CoV-2/FLU/RSV testing.  Fact Sheet for Patients: EntrepreneurPulse.com.au  Fact Sheet for Healthcare Providers: IncredibleEmployment.be  This test is not yet approved or cleared by the [place] FDA and has been authorized for detection and/or diagnosis of SARS-CoV-2 by FDA under an Emergency Use Authorization (EUA). This EUA will remain in effect (meaning this test can be used) for the duration of the COVID-19 declaration under Section 564(b)(1) of the Act, 21 U.S.C. section 360bbb-3(b)(1), unless the authorization is terminated or revoked.  Performed at [facility], [place] [address] [place], [place] [address]    WBC 06/08/2021 7.4  4.0 - 10.5 K/uL Final   RBC 06/08/2021 4.69  4.22 - 5.81 MIL/uL Final   Hemoglobin 06/08/2021 14.3  13.0 - 17.0 g/dL Final   HCT 06/08/2021 42.2  39.0 - 52.0 % Final   MCV 06/08/2021 90.0  80.0 - 100.0 fL Final   MCH 06/08/2021 30.5  26.0 - 34.0 pg Final   MCHC 06/08/2021 33.9  30.0 - 36.0 g/dL Final   RDW 06/08/2021 13.6  11.5 - 15.5 % Final   Platelets 06/08/2021 266  150 - 400 K/uL Final   nRBC 06/08/2021 0.0  0.0 - 0.2 % Final   Neutrophils Relative % 06/08/2021 62  % Final   Neutro Abs 06/08/2021 4.5  1.7 - 7.7 K/uL Final   Lymphocytes Relative 06/08/2021 30  % Final   Lymphs Abs 06/08/2021 2.2  0.7 - 4.0 K/uL Final   Monocytes Relative 06/08/2021 7  % Final   Monocytes Absolute 06/08/2021 0.5  0.1 - 1.0 K/uL Final   Eosinophils  Relative 06/08/2021 1  % Final   Eosinophils Absolute 06/08/2021 0.1  0.0 - 0.5 K/uL Final   Basophils Relative 06/08/2021 0  % Final   Basophils Absolute 06/08/2021 0.0  0.0 - 0.1 K/uL Final   Immature Granulocytes 06/08/2021 0  % Final   Abs Immature Granulocytes 06/08/2021 0.02  0.00 - 0.07 K/uL Final   Performed at [facility], [place] [address] [place], [place] [address]   Sodium 06/08/2021 138  135 - 145 mmol/L Final   Potassium 06/08/2021 3.6  3.5 - 5.1 mmol/L Final   Chloride 06/08/2021 101  98 - 111 mmol/L Final   CO2 06/08/2021 21 (L)  22 - 32 mmol/L Final   Glucose, Bld 06/08/2021 99  70 - 99 mg/dL Final   Glucose reference range applies only to samples taken after fasting for at least 8 hours.   BUN 06/08/2021 13  6 - 20 mg/dL Final   Creatinine, Ser 06/08/2021 0.81  0.61 - 1.24 mg/dL Final   Calcium 06/08/2021 9.0  8.9 - 10.3 mg/dL Final   Total Protein 06/08/2021 7.0  6.5 - 8.1 g/dL Final   Albumin 06/08/2021 4.2  3.5 - 5.0 g/dL Final   AST 06/08/2021 31  15 - 41 U/L Final   ALT 06/08/2021 23  0 - 44 U/L Final   Alkaline Phosphatase 06/08/2021 63  38 - 126 U/L Final   Total Bilirubin 06/08/2021 0.8  0.3 - 1.2 mg/dL Final   GFR, Estimated 06/08/2021 >60  >60 mL/min Final   Comment: (NOTE) Calculated using the CKD-EPI Creatinine Equation (2021)    Anion gap 06/08/2021 16 (H)  5 - 15 Final   Performed at [facility], [place] [address], [place], [place] [address]   Magnesium 06/08/2021 2.3  1.7 - 2.4 mg/dL Final   Performed at [place] [address] [place], [place] [address]   Alcohol, Ethyl (B) 06/08/2021 166 (H)  <10 mg/dL Final   Comment: (NOTE) Lowest detectable limit for serum alcohol is 10 mg/dL.  For medical purposes only. Performed at [facility], [place] [address] [place], [place] [address]    Cholesterol 06/08/2021 227 (H)  0 - 200 mg/dL Final   Triglycerides 06/08/2021 135  <150 mg/dL Final   HDL 06/08/2021 96  >40 mg/dL Final    Total CHOL/HDL Ratio 06/08/2021 2.4  RATIO Final   VLDL 06/08/2021 27  0 - 40 mg/dL Final   LDL Cholesterol 06/08/2021 104 (H)  0 - 99 mg/dL Final   Comment:        Total Cholesterol/HDL:CHD Risk Coronary Heart Disease Risk Table                     Men   Women  1/2 Average Risk   3.4   3.3  Average Risk       5.0   4.4  2 X Average Risk   9.6   7.1  3 X Average Risk  23.4   11.0        Use the calculated Patient Ratio above and the CHD Risk Table to determine the patient's CHD Risk.        ATP III CLASSIFICATION (LDL):  <100     mg/dL   Optimal  100-129  mg/dL   Near or Above                    Optimal  130-159  mg/dL   Borderline  160-189  mg/dL   High  >190     mg/dL   Very High Performed at [facility] [address], [place], [place] [address]    TSH 06/08/2021 2.022  0.350 - 4.500 uIU/mL Final   Comment: Performed by a 3rd Generation assay with a functional sensitivity of <=0.01 uIU/mL. Performed at [facility], [place] [address] [place], [place] [address]    Prolactin 06/08/2021 9.7  4.0 - 15.2 ng/mL Final   Comment: (NOTE) Performed At: [facility] [place], [place] [id number] Rush Farmer MD [id number]    POC Amphetamine UR 06/08/2021 None Detected  NONE DETECTED (Cut Off Level 1000 ng/mL) Final   POC Secobarbital (BAR) 06/08/2021 None Detected  NONE DETECTED (Cut Off Level 300 ng/mL) Final   POC Buprenorphine (BUP) 06/08/2021 None Detected  NONE DETECTED (Cut Off Level 10 ng/mL) Final   POC Oxazepam (BZO) 06/08/2021 None Detected  NONE DETECTED (Cut Off Level 300 ng/mL) Final   POC Cocaine UR 06/08/2021 Positive (A)  NONE DETECTED (Cut Off Level 300 ng/mL) Final   POC Methamphetamine UR 06/08/2021 None Detected  NONE DETECTED (Cut Off Level 1000 ng/mL) Final   POC Morphine 06/08/2021 None Detected  NONE DETECTED (Cut Off Level 300 ng/mL) Final   POC Oxycodone UR 06/08/2021 None Detected  NONE  DETECTED (Cut Off Level 100  ng/mL) Final   POC Methadone UR 06/08/2021 None Detected  NONE DETECTED (Cut Off Level 300 ng/mL) Final   POC Marijuana UR 06/08/2021 Positive (A)  NONE DETECTED (Cut Off Level 50 ng/mL) Final  Admission on 04/26/2021, Discharged on 04/28/2021  Component Date Value Ref Range Status   SARS Coronavirus 2 by RT PCR 04/27/2021 NEGATIVE  NEGATIVE Final   Comment: (NOTE) SARS-CoV-2 target nucleic acids are NOT DETECTED.  The SARS-CoV-2 RNA is generally detectable in upper respiratory specimens during the acute phase of infection. The lowest concentration of SARS-CoV-2 viral copies this assay can detect is 138 copies/mL. A negative result does not preclude SARS-Cov-2 infection and should not be used as the sole basis for treatment or other patient management decisions. A negative result may occur with  improper specimen collection/handling, submission of specimen other than nasopharyngeal swab, presence of viral mutation(s) within the areas targeted by this assay, and inadequate number of viral copies(<138 copies/mL). A negative result must be combined with clinical observations, patient history, and epidemiological information. The expected result is Negative.  Fact Sheet for Patients:  BloggerCourse.com  Fact Sheet for Healthcare Providers:  SeriousBroker.it  This test is no                          t yet approved or cleared by the [place] FDA and  has been authorized for detection and/or diagnosis of SARS-CoV-2 by FDA under an Emergency Use Authorization (EUA). This EUA will remain  in effect (meaning this test can be used) for the duration of the COVID-19 declaration under Section 564(b)(1) of the Act, 21 U.S.C.section 360bbb-3(b)(1), unless the authorization is terminated  or revoked sooner.       Influenza A by PCR 04/27/2021 NEGATIVE  NEGATIVE Final   Influenza B by PCR 04/27/2021 NEGATIVE  NEGATIVE Final   Comment:  (NOTE) The Xpert Xpress SARS-CoV-2/FLU/RSV plus assay is intended as an aid in the diagnosis of influenza from Nasopharyngeal swab specimens and should not be used as a sole basis for treatment. Nasal washings and aspirates are unacceptable for Xpert Xpress SARS-CoV-2/FLU/RSV testing.  Fact Sheet for Patients: BloggerCourse.com  Fact Sheet for Healthcare Providers: SeriousBroker.it  This test is not yet approved or cleared by the [place] FDA and has been authorized for detection and/or diagnosis of SARS-CoV-2 by FDA under an Emergency Use Authorization (EUA). This EUA will remain in effect (meaning this test can be used) for the duration of the COVID-19 declaration under Section 564(b)(1) of the Act, 21 U.S.C. section 360bbb-3(b)(1), unless the authorization is terminated or revoked.  Performed at [facility], [address], [place], [place] [address]    SARS Coronavirus 2 Ag 04/27/2021 Negative  Negative Preliminary   WBC 04/27/2021 6.2  4.0 - 10.5 K/uL Final   RBC 04/27/2021 5.19  4.22 - 5.81 MIL/uL Final   Hemoglobin 04/27/2021 15.9  13.0 - 17.0 g/dL Final   HCT 91/47/8295 45.5  39.0 - 52.0 % Final   MCV 04/27/2021 87.7  80.0 - 100.0 fL Final   MCH 04/27/2021 30.6  26.0 - 34.0 pg Final   MCHC 04/27/2021 34.9  30.0 - 36.0 g/dL Final   RDW 62/13/0865 13.6  11.5 - 15.5 % Final   Platelets 04/27/2021 269  150 - 400 K/uL Final   nRBC 04/27/2021 0.0  0.0 - 0.2 % Final   Neutrophils Relative % 04/27/2021 34  %  Final   Neutro Abs 04/27/2021 2.1  1.7 - 7.7 K/uL Final   Lymphocytes Relative 04/27/2021 55  % Final   Lymphs Abs 04/27/2021 3.4  0.7 - 4.0 K/uL Final   Monocytes Relative 04/27/2021 7  % Final   Monocytes Absolute 04/27/2021 0.4  0.1 - 1.0 K/uL Final   Eosinophils Relative 04/27/2021 3  % Final   Eosinophils Absolute 04/27/2021 0.2  0.0 - 0.5 K/uL Final   Basophils Relative 04/27/2021 1  % Final   Basophils  Absolute 04/27/2021 0.0  0.0 - 0.1 K/uL Final   Immature Granulocytes 04/27/2021 0  % Final   Abs Immature Granulocytes 04/27/2021 0.01  0.00 - 0.07 K/uL Final   Performed at [facility], [place] [address], [place], [place] [address]   Sodium 04/27/2021 138  135 - 145 mmol/L Final   Potassium 04/27/2021 4.2  3.5 - 5.1 mmol/L Final   Chloride 04/27/2021 105  98 - 111 mmol/L Final   CO2 04/27/2021 24  22 - 32 mmol/L Final   Glucose, Bld 04/27/2021 89  70 - 99 mg/dL Final   Glucose reference range applies only to samples taken after fasting for at least 8 hours.   BUN 04/27/2021 13  6 - 20 mg/dL Final   Creatinine, Ser 04/27/2021 0.73  0.61 - 1.24 mg/dL Final   Calcium 04/27/2021 9.5  8.9 - 10.3 mg/dL Final   Total Protein 04/27/2021 6.6  6.5 - 8.1 g/dL Final   Albumin 04/27/2021 4.0  3.5 - 5.0 g/dL Final   AST 04/27/2021 38  15 - 41 U/L Final   ALT 04/27/2021 23  0 - 44 U/L Final   Alkaline Phosphatase 04/27/2021 89  38 - 126 U/L Final   Total Bilirubin 04/27/2021 0.8  0.3 - 1.2 mg/dL Final   GFR, Estimated 04/27/2021 >60  >60 mL/min Final   Comment: (NOTE) Calculated using the CKD-EPI Creatinine Equation (2021)    Anion gap 04/27/2021 9  5 - 15 Final   Performed at [facility] [address], [place], [place] [address]   Hgb A1c MFr Bld 04/27/2021 5.7 (H)  4.8 - 5.6 % Final   Comment: (NOTE) Pre diabetes:          5.7%-6.4%  Diabetes:              >6.4%  Glycemic control for   <7.0% adults with diabetes    Mean Plasma Glucose 04/27/2021 116.89  mg/dL Final   Performed at [facility], [place] [address], [place], [place] [address]   Alcohol, Ethyl (B) 04/27/2021 251 (H)  <10 mg/dL Final   Comment: (NOTE) Lowest detectable limit for serum alcohol is 10 mg/dL.  For medical purposes only. Performed at [facility], [place] [address], [place], [place] [address]    Cholesterol 04/27/2021 209 (H)  0 - 200 mg/dL Final   Triglycerides 04/27/2021 615 (H)  <150  mg/dL Final   HDL 04/27/2021 66  >40 mg/dL Final   Total CHOL/HDL Ratio 04/27/2021 3.2  RATIO Final   VLDL 04/27/2021 UNABLE TO CALCULATE IF TRIGLYCERIDE OVER 400 mg/dL  0 - 40 mg/dL Final   LDL Cholesterol 04/27/2021 UNABLE TO CALCULATE IF TRIGLYCERIDE OVER 400 mg/dL  0 - 99 mg/dL Final   Comment:        Total Cholesterol/HDL:CHD Risk Coronary Heart Disease Risk Table  Men   Women  1/2 Average Risk   3.4   3.3  Average Risk       5.0   4.4  2 X Average Risk   9.6   7.1  3 X Average Risk  23.4   11.0        Use the calculated Patient Ratio above and the CHD Risk Table to determine the patient's CHD Risk.        ATP III CLASSIFICATION (LDL):  <100     mg/dL   Optimal  100-129  mg/dL   Near or Above                    Optimal  130-159  mg/dL   Borderline  160-189  mg/dL   High  >190     mg/dL   Very High Performed at [facility] [address], [place], [place] [address]    TSH 04/27/2021 2.502  0.350 - 4.500 uIU/mL Final   Comment: Performed by a 3rd Generation assay with a functional sensitivity of <=0.01 uIU/mL. Performed at [facility], [place] [address], [place], [place] [address]    POC Amphetamine UR 04/27/2021 None Detected  NONE DETECTED (Cut Off Level 1000 ng/mL) Final   POC Secobarbital (BAR) 04/27/2021 None Detected  NONE DETECTED (Cut Off Level 300 ng/mL) Final   POC Buprenorphine (BUP) 04/27/2021 None Detected  NONE DETECTED (Cut Off Level 10 ng/mL) Final   POC Oxazepam (BZO) 04/27/2021 None Detected  NONE DETECTED (Cut Off Level 300 ng/mL) Final   POC Cocaine UR 04/27/2021 None Detected  NONE DETECTED (Cut Off Level 300 ng/mL) Final   POC Methamphetamine UR 04/27/2021 None Detected  NONE DETECTED (Cut Off Level 1000 ng/mL) Final   POC Morphine 04/27/2021 None Detected  NONE DETECTED (Cut Off Level 300 ng/mL) Final   POC Oxycodone UR 04/27/2021 None Detected  NONE DETECTED (Cut Off Level 100 ng/mL) Final   POC Methadone UR 04/27/2021  None Detected  NONE DETECTED (Cut Off Level 300 ng/mL) Final   POC Marijuana UR 04/27/2021 None Detected  NONE DETECTED (Cut Off Level 50 ng/mL) Final   SARSCOV2ONAVIRUS 2 AG 04/27/2021 NEGATIVE  NEGATIVE Final   Comment: (NOTE) SARS-CoV-2 antigen NOT DETECTED.   Negative results are presumptive.  Negative results do not preclude SARS-CoV-2 infection and should not be used as the sole basis for treatment or other patient management decisions, including infection  control decisions, particularly in the presence of clinical signs and  symptoms consistent with COVID-19, or in those who have been in contact with the virus.  Negative results must be combined with clinical observations, patient history, and epidemiological information. The expected result is Negative.  Fact Sheet for Patients: HandmadeRecipes.com.cy  Fact Sheet for Healthcare Providers: FuneralLife.at  This test is not yet approved or cleared by the [place] FDA and  has been authorized for detection and/or diagnosis of SARS-CoV-2 by FDA under an Emergency Use Authorization (EUA).  This EUA will remain in effect (meaning this test can be used) for the duration of  the COV                          ID-19 declaration under Section 564(b)(1) of the Act, 21 U.S.C. section 360bbb-3(b)(1), unless the authorization is terminated or revoked sooner.     Direct LDL 04/27/2021 103.4 (H)  0 - 99 mg/dL Final   Performed at [facility], [place] [address] [place], [place] [address]    Blood Alcohol level:  Lab Results  Component Value Date   ETH 166 (H) 06/08/2021   ETH 251 (H) [id number]    Metabolic Disorder Labs: Lab Results  Component Value Date   HGBA1C 5.7 (H) 04/27/2021   MPG 116.89 04/27/2021   MPG 117 04/17/2009   Lab Results  Component Value Date   PROLACTIN 9.7 06/08/2021   Lab Results  Component Value Date   CHOL 227 (H) 06/08/2021   TRIG 135  06/08/2021   HDL 96 06/08/2021   CHOLHDL 2.4 06/08/2021   VLDL 27 06/08/2021   LDLCALC 104 (H) 06/08/2021   LDLCALC UNABLE TO CALCULATE IF TRIGLYCERIDE OVER 400 mg/dL 04/27/2021    Therapeutic Lab Levels: No results found for: LITHIUM No results found for: VALPROATE No components found for:  CBMZ  Physical Findings   GAD-7    Flowsheet Row Office Visit from 06/14/2020 in [place] Office Visit from 05/27/2020 in [place]  Total GAD-7 Score 11 9      PHQ2-9    [place] ED from 04/26/2021 in [facility] Office Visit from 06/14/2020 in [place] Office Visit from 05/27/2020 in [place] Nutrition from [facility]2017 [facility] from 07/23/2017 in [facility]  PHQ-2 Total Score 4 2 2  0 0  PHQ-9 Total Score 19 15 11  -- --      Flowsheet Row ED from 06/08/2021 in [facility] ED from 04/26/2021 in [facility] ED from 10/02/2020 in [place] No Risk No Risk No Risk        Musculoskeletal  Strength & Muscle Tone: within normal limits Gait & Station: normal Patient leans: N/A  Psychiatric Specialty Exam  Presentation  General Appearance: Appropriate for Environment; Casual  Eye Contact:Good  Speech:Clear and Coherent; Normal Rate  Speech Volume:Normal  Handedness:Right   Mood and Affect  Mood:Anxious; Depressed  Affect:Congruent   Thought Process  Thought Processes:Coherent  Descriptions of Associations:Intact  Orientation:Full (Time, Place and Person)  Thought Content:Logical  Diagnosis of Schizophrenia or Schizoaffective disorder in past: No    Hallucinations:Hallucinations: None  Ideas of  Reference:None  Suicidal Thoughts:Suicidal Thoughts: No  Homicidal Thoughts:Homicidal Thoughts: No   Sensorium  Memory:Immediate Good; Recent Good; Remote Good  Judgment:Fair  Insight:Fair   Executive Functions  Concentration:Good  Attention Span:Fair; Good  Recall:Good  Fund of Knowledge:Good  Language:Good   Psychomotor Activity  Psychomotor Activity:Psychomotor Activity: Normal   Assets  Assets:Communication Skills; Desire for Improvement; Financial Resources/Insurance; Physical Health; Resilience   Sleep  Sleep:Sleep: Fair   No data recorded  Physical Exam  Physical Exam Vitals and nursing note reviewed.  Constitutional:      General: He is not in acute distress.    Appearance: Normal appearance. He is well-developed.  HENT:     Head: Normocephalic and atraumatic.  Eyes:     General:        Right eye: No discharge.        Left eye: No discharge.     Conjunctiva/sclera: Conjunctivae normal.  Cardiovascular:     Rate and Rhythm: Normal rate and regular rhythm.     Heart sounds: No murmur heard. Pulmonary:     Effort: Pulmonary effort is  normal. No respiratory distress.     Breath sounds: Normal breath sounds.  Abdominal:     Palpations: Abdomen is soft.     Tenderness: There is no abdominal tenderness.  Musculoskeletal:        General: No swelling. Normal range of motion.     Cervical back: Normal range of motion and neck supple.  Skin:    General: Skin is warm and dry.     Capillary Refill: Capillary refill takes less than 2 seconds.     Coloration: Skin is not jaundiced or pale.  Neurological:     Mental Status: He is alert and oriented to person, place, and time.  Psychiatric:        Attention and Perception: Attention and perception normal.        Mood and Affect: Mood is anxious and depressed.        Speech: Speech normal.        Behavior: Behavior normal.        Thought Content: Thought content normal.        Cognition and Memory:  Cognition normal.        Judgment: Judgment is impulsive.   Review of Systems  Constitutional: Negative.   HENT: Negative.    Eyes: Negative.   Respiratory: Negative.    Cardiovascular: Negative.   Gastrointestinal: Negative.   Genitourinary: Negative.   Musculoskeletal: Negative.   Skin: Negative.   Neurological:  Positive for tremors and headaches.  Endo/Heme/Allergies: Negative.   Psychiatric/Behavioral:  Positive for depression and substance abuse. The patient is nervous/anxious.   Blood pressure 127/79, pulse 61, temperature 97.7 F (36.5 C), temperature source Tympanic, resp. rate 18, SpO2 99 %. There is no height or weight on file to calculate BMI.  Treatment Plan Summary: Daily contact with patient to assess and evaluate symptoms and progress in treatment and Medication management  Disposition: Ongoing. Anticipate discharge to a rehabilitation facility after completion of alcohol detox if able to move court dates. If unable to move court dates; may need to consider alternatives such as sober living. SW assisting.   Revonda Humphrey, NP 06/10/2021 10:44 AM

## 2021-06-10 NOTE — Discharge Instructions (Addendum)

## 2021-06-27 ENCOUNTER — Telehealth (HOSPITAL_COMMUNITY): Payer: Self-pay

## 2021-06-27 NOTE — BH Assessment (Signed)
Care Management - [facility]UC Follow Up Discharges  ? ?Writer attempted to make contact with patient today and was unsuccessful.  Writer left a HIPPA compliant voice message.  ? ?Per chart review, patient was provided with outpatient substance abuse resources. ? ?

## 2021-07-25 ENCOUNTER — Other Ambulatory Visit (HOSPITAL_COMMUNITY)
Admission: EM | Admit: 2021-07-25 | Discharge: 2021-07-28 | Disposition: A | Discharge status: Home / Self Care | Payer: No Payment, Other | Attending: Psychiatry | Admitting: Psychiatry

## 2021-07-25 DIAGNOSIS — F1020 Alcohol dependence, uncomplicated: Secondary | ICD-10-CM | POA: Diagnosis present

## 2021-07-25 DIAGNOSIS — Z20822 Contact with and (suspected) exposure to covid-19: Secondary | ICD-10-CM | POA: Diagnosis not present

## 2021-07-25 DIAGNOSIS — F319 Bipolar disorder, unspecified: Secondary | ICD-10-CM | POA: Diagnosis not present

## 2021-07-25 DIAGNOSIS — F10129 Alcohol abuse with intoxication, unspecified: Secondary | ICD-10-CM | POA: Insufficient documentation

## 2021-07-25 LAB — URINALYSIS, COMPLETE (UACMP) WITH MICROSCOPIC
Bacteria, UA: NONE SEEN
Bilirubin Urine: NEGATIVE
Glucose, UA: NEGATIVE mg/dL
Ketones, ur: NEGATIVE mg/dL
Leukocytes,Ua: NEGATIVE
Nitrite: NEGATIVE
Protein, ur: NEGATIVE mg/dL
Specific Gravity, Urine: 1.006 (ref 1.005–1.030)
pH: 6 (ref 5.0–8.0)

## 2021-07-25 LAB — POCT URINE DRUG SCREEN - MANUAL ENTRY (I-SCREEN)
POC Amphetamine UR: NOT DETECTED
POC Buprenorphine (BUP): NOT DETECTED
POC Cocaine UR: POSITIVE — AB
POC Marijuana UR: POSITIVE — AB
POC Methadone UR: NOT DETECTED
POC Methamphetamine UR: NOT DETECTED
POC Morphine: NOT DETECTED
POC Oxazepam (BZO): POSITIVE — AB
POC Oxycodone UR: NOT DETECTED
POC Secobarbital (BAR): NOT DETECTED

## 2021-07-25 LAB — CBC WITH DIFFERENTIAL/PLATELET
Abs Immature Granulocytes: 0.08 10*3/uL — ABNORMAL HIGH (ref 0.00–0.07)
Basophils Absolute: 0 10*3/uL (ref 0.0–0.1)
Basophils Relative: 0 %
Eosinophils Absolute: 0 10*3/uL (ref 0.0–0.5)
Eosinophils Relative: 0 %
HCT: 46.5 % (ref 39.0–52.0)
Hemoglobin: 15.5 g/dL (ref 13.0–17.0)
Immature Granulocytes: 1 %
Lymphocytes Relative: 9 %
Lymphs Abs: 1.5 10*3/uL (ref 0.7–4.0)
MCH: 30.1 pg (ref 26.0–34.0)
MCHC: 33.3 g/dL (ref 30.0–36.0)
MCV: 90.3 fL (ref 80.0–100.0)
Monocytes Absolute: 1.3 10*3/uL — ABNORMAL HIGH (ref 0.1–1.0)
Monocytes Relative: 8 %
Neutro Abs: 13 10*3/uL — ABNORMAL HIGH (ref 1.7–7.7)
Neutrophils Relative %: 82 %
Platelets: 287 10*3/uL (ref 150–400)
RBC: 5.15 MIL/uL (ref 4.22–5.81)
RDW: 13.2 % (ref 11.5–15.5)
WBC: 15.9 10*3/uL — ABNORMAL HIGH (ref 4.0–10.5)
nRBC: 0 % (ref 0.0–0.2)

## 2021-07-25 LAB — COMPREHENSIVE METABOLIC PANEL
ALT: 42 U/L (ref 0–44)
AST: 32 U/L (ref 15–41)
Albumin: 4.5 g/dL (ref 3.5–5.0)
Alkaline Phosphatase: 67 U/L (ref 38–126)
Anion gap: 16 — ABNORMAL HIGH (ref 5–15)
BUN: 10 mg/dL (ref 6–20)
CO2: 20 mmol/L — ABNORMAL LOW (ref 22–32)
Calcium: 9.5 mg/dL (ref 8.9–10.3)
Chloride: 99 mmol/L (ref 98–111)
Creatinine, Ser: 0.74 mg/dL (ref 0.61–1.24)
GFR, Estimated: >60 mL/min (ref >60)
Glucose, Bld: 108 mg/dL — ABNORMAL HIGH (ref 70–99)
Potassium: 3.7 mmol/L (ref 3.5–5.1)
Sodium: 135 mmol/L (ref 135–145)
Total Bilirubin: 0.7 mg/dL (ref 0.3–1.2)
Total Protein: 7.5 g/dL (ref 6.5–8.1)

## 2021-07-25 LAB — RESP PANEL BY RT-PCR (FLU A&B, COVID) ARPGX2
Influenza A by PCR: NEGATIVE
Influenza B by PCR: NEGATIVE
SARS Coronavirus 2 by RT PCR: NEGATIVE

## 2021-07-25 LAB — ETHANOL: Alcohol, Ethyl (B): 124 mg/dL — ABNORMAL HIGH (ref <10)

## 2021-07-25 LAB — POC SARS CORONAVIRUS 2 AG -  ED: SARS Coronavirus 2 Ag: NEGATIVE

## 2021-07-25 LAB — MAGNESIUM: Magnesium: 2.1 mg/dL (ref 1.7–2.4)

## 2021-07-25 MED ORDER — ADULT MULTIVITAMIN W/MINERALS CH
1.0000 | ORAL_TABLET | Freq: Every day | ORAL | Status: DC
  Administered 2021-07-25 – 2021-07-28 (×4): 1 via ORAL
  Filled 2021-07-25 (×4): qty 1
  Filled 2021-07-25: qty 7

## 2021-07-25 MED ORDER — LORAZEPAM 1 MG PO TABS
1.0000 mg | ORAL_TABLET | Freq: Four times a day (QID) | ORAL | Status: DC | PRN
  Administered 2021-07-25: 1 mg via ORAL
  Filled 2021-07-25: qty 1

## 2021-07-25 MED ORDER — NICOTINE 21 MG/24HR TD PT24
21.0000 mg | MEDICATED_PATCH | Freq: Once | TRANSDERMAL | Status: AC
  Administered 2021-07-25: 21 mg via TRANSDERMAL
  Filled 2021-07-25: qty 1

## 2021-07-25 MED ORDER — LOPERAMIDE HCL 2 MG PO CAPS: 2.0000 mg | ORAL_CAPSULE | ORAL | Status: DC | PRN

## 2021-07-25 MED ORDER — MAGNESIUM HYDROXIDE 400 MG/5ML PO SUSP: 30.0000 mL | Freq: Every day | ORAL | Status: DC | PRN

## 2021-07-25 MED ORDER — THIAMINE HCL 100 MG PO TABS
100.0000 mg | ORAL_TABLET | Freq: Every day | ORAL | Status: DC
Start: 2021-07-26 — End: 2021-07-28
  Administered 2021-07-26 – 2021-07-28 (×3): 100 mg via ORAL
  Filled 2021-07-25 (×3): qty 1

## 2021-07-25 MED ORDER — ALUM & MAG HYDROXIDE-SIMETH 200-200-20 MG/5ML PO SUSP
30.0000 mL | ORAL | Status: DC | PRN
  Administered 2021-07-25 – 2021-07-27 (×6): 30 mL via ORAL
  Filled 2021-07-25 (×6): qty 30

## 2021-07-25 MED ORDER — ACETAMINOPHEN 325 MG PO TABS
650.0000 mg | ORAL_TABLET | Freq: Four times a day (QID) | ORAL | Status: DC | PRN
  Administered 2021-07-28: 650 mg via ORAL
  Filled 2021-07-25: qty 2

## 2021-07-25 MED ORDER — IBUPROFEN 600 MG PO TABS
600.0000 mg | ORAL_TABLET | Freq: Four times a day (QID) | ORAL | Status: DC | PRN
  Administered 2021-07-25 – 2021-07-27 (×3): 600 mg via ORAL
  Filled 2021-07-25 (×3): qty 1

## 2021-07-25 MED ORDER — THIAMINE HCL 100 MG/ML IJ SOLN: 100.0000 mg | Freq: Once | INTRAMUSCULAR | Status: DC

## 2021-07-25 MED ORDER — GABAPENTIN 100 MG PO CAPS
100.0000 mg | ORAL_CAPSULE | Freq: Three times a day (TID) | ORAL | Status: DC
  Administered 2021-07-25 – 2021-07-28 (×8): 100 mg via ORAL
  Filled 2021-07-25 (×7): qty 1
  Filled 2021-07-25: qty 21
  Filled 2021-07-25: qty 1

## 2021-07-25 MED ORDER — TRAZODONE HCL 50 MG PO TABS
50.0000 mg | ORAL_TABLET | Freq: Every evening | ORAL | Status: DC | PRN
  Administered 2021-07-26 (×2): 50 mg via ORAL
  Filled 2021-07-25 (×2): qty 1

## 2021-07-25 MED ORDER — HYDROXYZINE HCL 25 MG PO TABS
25.0000 mg | ORAL_TABLET | Freq: Four times a day (QID) | ORAL | Status: DC | PRN
  Administered 2021-07-25 – 2021-07-26 (×2): 25 mg via ORAL
  Filled 2021-07-25 (×2): qty 1

## 2021-07-25 MED ORDER — ONDANSETRON 4 MG PO TBDP: 4.0000 mg | ORAL_TABLET | Freq: Four times a day (QID) | ORAL | Status: DC | PRN

## 2021-07-25 NOTE — ED Provider Notes (Signed)
Behavioral Health Admission H&P ?(FBC & OBS) ? ?Date: 07/25/21 ?Patient Name: Brendan Ochoa ?MRN: [id number] ?Chief Complaint:  ?Chief Complaint  ?Patient presents with  ? Addiction Problem  ? Alcohol Problem  ? Depression  ?   ? ?Diagnoses:  ?Final diagnoses:  ?Alcohol abuse with intoxication ([place])  ? ? ?HPI: patient presented to [facility] as a walk in alone requesting alcohol detox. ? ?Brendan Ochoa, 46 y.o., male patient seen face to face by this provider, consulted with Dr. Serafina Mitchell; and chart reviewed on 07/25/21. ? ?Patient has a history of substance use disorder, alcohol use disorder, MDD, and cocaine abuse.  Patient was admitted to the [facility] on 06/08/2021, and requested to be discharged on 06/10/2021. He admits that he should not have requested to be discharged at that time.  States once he was discharged he was only sober for 2 weeks.  Since that time he has drinking a case of beer and alcohol daily.  His last drink was within 1 hour of presenting to [place].  He is using one half a gram of cocaine daily for 2 weeks.  He uses Xanax  and THC occasionally.  He denies any history of seizures or DTs from alcohol withdrawal.  On admission his BAL is 124.  UDS is positive for benzodiazepines, cocaine, and marijuana.  Reports he has been to "multiple rehab places".  Reports his longest period of sobriety is 8 months.He is homeless but he does sleep at his mother's house and at his friend's houses at times.  He was living at the [facility] but recently got kicked out due to alcohol/drug use.  He works as an Clinical biochemist. ? ?On evaluation Brendan Ochoa is sitting in a chair in the assessment room.  He is intoxicated.  He is slow to respond and has slurred speech.  However his speech is clear and coherent.  He is tearful throughout the assessment.  He is alert/oriented x4 and cooperative.  He endorses increased depression and anxiety.  States alcohol is "one way I self medicate".  He endorses suicidal  ideations.  States, "I am tired of life I am tired of it all, tired trying to stay clean".  He denies having any intent, plan, or access to means.  He denies HI.  Objectively he does not appear to be responding to internal/external stimuli.  He denies AVH, paranoia, or delusional thought content. ? ?Discussed admission to the continuous assessment unit for overnight observation with a reevaluation by psychiatry in the a.m. patient is in agreement.  Discussed at that time treatment options could be discussed.  He is unsure if he would be interested in residential treatment.  States, "I have done that so many times and it did not help".  States he needs to be able to go to work every day and he cannot do that in a residential treatment facility.  Patient is interested in [facility] admission. ? ? ?PHQ 2-9:  ?Flowsheet Row ED from 07/25/2021 in [facility] ED from 04/26/2021 in [facility] Office Visit from 06/14/2020 in [place]  ?Thoughts that you would be better off dead, or of hurting yourself in some way Several days Several days Not at all  ?PHQ-9 Total Score 9 19 15   ? ?  ?  ?[place] ED from 07/25/2021 in [facility] ED from 06/08/2021 in [facility] ED from [facility]2021 in  [facility]  ?C-SSRS RISK CATEGORY High Risk No Risk No Risk  ? ?  ?  ? ?Total Time spent with patient: 45 minutes ? ?Musculoskeletal  ?Strength & Muscle Tone: within normal limits ?Gait & Station: normal ?Patient leans: N/A ? ?Psychiatric Specialty Exam  ?Presentation ?General Appearance: Casual ? ?Eye Contact:Fleeting ? ?Speech:Slurred ? ?Speech Volume:Normal ? ?Handedness:Right ? ? ?Mood and Affect  ?Mood:Depressed ? ?Affect:Congruent; Depressed; Tearful ? ? ?Thought Process  ?Thought Processes:Coherent ? ?Descriptions of Associations:Intact ? ?Orientation:Full (Time,  Place and Person) ? ?Thought Content:Logical ? Diagnosis of Schizophrenia or Schizoaffective disorder in past: No ?  ?Hallucinations:Hallucinations: None ? ?Ideas of Reference:None ? ?Suicidal Thoughts:Suicidal Thoughts: No ? ?Homicidal Thoughts:Homicidal Thoughts: No ? ? ?Sensorium  ?Memory:Immediate Good; Recent Good; Remote Poor ? ?Judgment:Fair ? ?Insight:Poor ? ? ?Executive Functions  ?Concentration:Poor ? ?Attention Span:Fair ? ?Recall:Good ? ?Fund of [place] ? ?Language:Good ? ? ?Psychomotor Activity  ?Psychomotor Activity:Psychomotor Activity: Normal ? ? ?Assets  ?Assets:Communication Skills; Desire for Improvement; Financial Resources/Insurance; Housing; Physical Health; Leisure Time ? ? ?Sleep  ?Sleep:Sleep: Fair ? ? ?Nutritional Assessment (For OBS and FBC admissions only) ?Has the patient had a weight loss or gain of 10 pounds or more in the last 3 months?: No ?Has the patient had a decrease in food intake/or appetite?: No ?Does the patient have dental problems?: No ?Does the patient have eating habits or behaviors that may be indicators of an eating disorder including binging or inducing vomiting?: No ?Has the patient recently lost weight without trying?: 0 ?Has the patient been eating poorly because of a decreased appetite?: 0 ?Malnutrition Screening Tool Score: 0 ? ? ? ?Physical Exam ?Vitals and nursing note reviewed.  ?Constitutional:   ?   General: He is not in acute distress. ?   Appearance: He is well-developed.  ?HENT:  ?   Head: Normocephalic and atraumatic.  ?Eyes:  ?   General:     ?   Right eye: No discharge.     ?   Left eye: No discharge.  ?   Conjunctiva/sclera: Conjunctivae normal.  ?Cardiovascular:  ?   Rate and Rhythm: Normal rate.  ?Pulmonary:  ?   Effort: Pulmonary effort is normal. No respiratory distress.  ?Abdominal:  ?   Tenderness: There is no abdominal tenderness.  ?Musculoskeletal:     ?   General: Normal range of motion.  ?   Cervical back: Normal range of motion.   ?Skin: ?   General: Skin is warm and dry.  ?Neurological:  ?   Mental Status: He is alert and oriented to person, place, and time.  ?Psychiatric:     ?   Attention and Perception: He is inattentive.     ?   Mood and Affect: Mood is anxious and depressed. Affect is not labile.     ?   Speech: Speech is slurred.     ?   Behavior: Behavior is slowed. Behavior is cooperative.     ?   Thought Content: Thought content includes suicidal ideation. Thought content does not include suicidal plan.     ?   Cognition and Memory: Cognition normal.     ?   Judgment: Judgment is impulsive.  ? ?Review of Systems  ?Constitutional: Negative.   ?HENT: Negative.    ?Eyes: Negative.   ?Respiratory: Negative.    ?Cardiovascular: Negative.   ?Musculoskeletal: Negative.   ?Skin: Negative.   ?Neurological: Negative.   ?Psychiatric/Behavioral:  Positive for depression, substance  abuse and suicidal ideas. The patient is nervous/anxious.   ? ?Blood pressure 129/80, pulse (!) 118, temperature 98.4 ?F (36.9 ?C), temperature source Oral, resp. rate 17, SpO2 97 %. There is no height or weight on file to calculate BMI. ? ?Past Psychiatric History: Substance abuse MDD, cocaine use, alcohol use disorder ? ?Is the patient at risk to self? No  ?Has the patient been a risk to self in the past 6 months? No .    ?Has the patient been a risk to self within the distant past? No   ?Is the patient a risk to others? No   ?Has the patient been a risk to others in the past 6 months? No   ?Has the patient been a risk to others within the distant past? No  ? ?Past Medical History:  ?Past Medical History:  ?Diagnosis Date  ? Asthma   ? Cocaine abuse ([place])   ? in past  ? Crohn disease ([place]) 2008  ? Depression   ? in past, not current as of 2019   ? ETOH abuse   ? history of alcohol use  ? GERD (gastroesophageal reflux disease)   ?  ?Past Surgical History:  ?Procedure Laterality Date  ? BIOPSY  10/15/2017  ? Procedure: BIOPSY;  Surgeon: Danie Binder, MD;  [facility]  [facility];  Service: Endoscopy;;  terminal ileum ?colon  ? COLONOSCOPY WITH PROPOFOL N/A 10/15/2017  ? redundant colon, normal rectum, external and internal hemorrhoids, quiescent crohn's. Pathology: inactive chron

## 2021-07-25 NOTE — ED Notes (Signed)
Pt sleeping@this  time. Breathing even and unlabored. Will contine to monitot for safety ?

## 2021-07-25 NOTE — Progress Notes (Signed)
Received Brendan Ochoa in the lobby area, he was cooperative with the admission process. Shortly thereafter her was medicated per order. He continues to endorsed being tired of life related to the inability to stop drinking.  ?

## 2021-07-25 NOTE — Progress Notes (Signed)
TRIAGE: EMERGENT ? ? 07/25/21 1240  ?[place] Triage Screening (Walk-ins at [facility] only)  ?How Did You Hear About [name]? Self  ?What Is the Reason for Your Visit/Call Today? Pt states, "I'm tired of life - I can't stay clean." Pt shares he's currently experiencing SI, though he denies he has a plan to kill himself. Pt denies he's ever attempted ot kill himself in the past, though he acknowledges he's been hospitalized in the past. Pt denies HI, NSSIB, and access to guns/weapons. Pt endorses "a little bit" of AVH. Pt shares he currently uses EtOH, cocaine, and Xanax. He states he has been drinking all night and stopped one hour prior to coming to the [facility]. He states he drinks approximately a case of beer/day. Pt has been drinking daily for 1 month. Pt shares he has also been using cocaine; he states he last used 1/2 gram last night and that he snorts and smokes the cocaine. Pt has been using cocaine daily for 2 weeks. Pt states he uses Xanax "every so often." He states he last used several days ago and that he typically takes several 44m pills every several days.  ?How Long Has This Been Causing You Problems? > than 6 months  ?Have You Recently Had Any Thoughts About Hurting Yourself? Yes  ?How long ago did you have thoughts about hurting yourself? Currently  ?Are You Planning to Commit Suicide/Harm Yourself At This time? No  ?Have you Recently Had Thoughts About [place] No  ?Are You Planning To Harm Someone At This Time? No  ?Are you currently experiencing any auditory, visual or other hallucinations? No  ?Have You Used Any Alcohol or Drugs in the Past 24 Hours? Yes  ?How long ago did you use Drugs or Alcohol? Pt shares he currently uses EtOH, cocaine, and Xanax. He states he has been drinking all night and [facility] He states he drinks approximately a case of beer/day. Pt has been drinking daily for 1 month. Pt shares he has also been using cocaine; he states he last used  1/2 gram last night and that he snorts and smokes the cocaine. Pt has been using cocaine daily for 2 weeks. Pt states he uses Xanax "every so often." He states he last used several days ago and that he typically takes several 512mpills every several days.  ?What Did You Use and How Much? Pt shares he currently uses EtOH, cocaine, and Xanax. He states he has been drinking all night and [facility] a case of beer/day. Pt has been drinking daily for 1 month. Pt shares he has also been using cocaine; he states he last used 1/2 gram last night and that he snorts and smokes the cocaine. Pt has been using cocaine daily for 2 weeks. Pt states he uses Xanax "every so often." He states he last used several days ago and that he typically takes several 61m9mills every several days.  ?Do you have any current medical co-morbidities that require immediate attention? Yes  ?Please describe current medical co-morbidities that require immediate attention: Pt has been drinking all night and stopped drinking approx 1 hour ago  ?Clinician description of patient physical appearance/behavior: Pt is dressed in a casual manner. He is able to answer the questions posed. Pt is not feeling well and expresses thoughts that he might vomit.  ?What Do You Feel Would Help  You the Most Today? Alcohol or Drug Use Treatment  ?If access to [facility] was not available, would you have sought care in the Emergency Department? Yes  ?Determination of Need Emergent (2 hours)  ?Options For Referral Medication Management;Outpatient Therapy;Facility-Based Crisis  ? ? ?

## 2021-07-25 NOTE — Progress Notes (Signed)
Patient admitted to the observation unit seeking help for substance abuse with passive SI. Patient given food and drink. Patient asked for Ibuprofen RN. RN informed provider of request. Nursing staff will continue to monitor. ?

## 2021-07-25 NOTE — BH Assessment (Addendum)
Comprehensive Clinical Assessment (CCA) Note ? ?07/25/2021 ?Brendan Ochoa ?[id number] ? ?DISPOSITION: Per Thomes Lolling NP, pt is admitted to [facility] for alcohol detox.  ? ?The patient demonstrates the following risk factors for suicide: Chronic risk factors for suicide include: psychiatric disorder of MDD and substance use disorder. Acute risk factors for suicide include: family or marital conflict. Protective factors for this patient include: positive social support and hope for the future. Considering these factors, the overall suicide risk at this point appears to be low. Patient is appropriate for outpatient follow up. ? ?[place] ED from 07/25/2021 in [facility] ED from 06/08/2021 in [facility] ED from 04/26/2021 in [facility]  ?C-SSRS RISK CATEGORY No Risk No Risk No Risk  ? ?  ? ?Per Triage note: "Pt states, "I'm tired of life - I can't stay clean." Pt shares he's currently experiencing SI, though he denies he has a plan to kill himself. Pt denies he's ever attempted ot kill himself in the past, though he acknowledges he's been hospitalized in the past. Pt denies HI, NSSIB, and access to guns/weapons. Pt endorses "a little bit" of AVH. Pt shares he currently uses EtOH, cocaine, and Xanax. He states he has been drinking all night and stopped one hour prior to coming to the [facility]. He states he drinks approximately a case of beer/day. Pt has been drinking daily for 1 month. Pt shares he has also been using cocaine; he states he last used 1/2 gram last night and that he snorts and smokes the cocaine. Pt has been using cocaine daily for 2 weeks. Pt states he uses Xanax "every so often." He states he last used several days ago and that he typically takes several 22m pills every several days." ? ?Pt is dressed in a casual manner. He is able to answer the questions posed. Pt is not feeling well and expresses thoughts that he  might vomit. ? ?Upon further assessment: ?Pt stated he has been living with his mother or "sofa surfing" with friends for about a month [facility] Pt stated that his mother is supportive and does not mind if her drinks a little. Pt stated that id he does not drink he goes into withdrawal and stated he has had DTs in the past. Pt denies any access to guns. 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 Pt has been through multiple substance use treatment programs and detoxes.  ? ? ?Chief Complaint:  ?Chief Complaint  ?Patient presents with  ? Addiction Problem  ? Alcohol Problem  ? Depression  ? ?Visit Diagnosis:  ?Alcohol Use disorder ?Stimulant Use disorder  ? ? ?CCA Screening, Triage and Referral (STR) ? ?Patient Reported Information ?How did you hear about [place] ? ?What Is the Reason for Your Visit/Call Today? Pt states, "I'm tired of life - I can't stay clean." Pt shares he's currently experiencing SI, though he denies he has a plan to kill himself. Pt denies he's ever attempted ot kill himself in the past, though he acknowledges he's been hospitalized in the past. Pt denies  HI, NSSIB, and access to guns/weapons. Pt endorses "a little bit" of AVH. Pt shares he currently uses EtOH, cocaine, and Xanax. He states he has been drinking all night and stopped one hour prior to coming to the [facility]. He states he drinks approximately a case of beer/day. Pt has been drinking daily for 1 month. Pt shares he has also been using cocaine; he states he last used 1/2 gram last night and that he snorts and smokes the cocaine. Pt has been using cocaine daily for 2 weeks. Pt states he uses Xanax "every so often." He states  he last used several days ago and that he typically takes several 62m pills every several days. ? ?How Long Has This Been Causing You Problems? > than 6 months ? ?What Do You Feel Would Help You the Most Today? Alcohol or Drug Use Treatment ? ? ?Have You Recently Had Any Thoughts About Hurting Yourself? Yes ? ?Are You Planning to Commit Suicide/Harm Yourself At This time? No ? ? ?Have you Recently Had Thoughts About [place] No ? ?Are You Planning to Harm Someone at This Time? No ? ?Explanation: No data recorded ? ?Have You Used Any Alcohol or Drugs in the Past 24 Hours? Yes ? ?How Long Ago Did You Use Drugs or Alcohol? No data recorded ?What Did You Use and How Much? Pt shares he currently uses EtOH, cocaine, and Xanax. He states he has been drinking all night and [facility] He states he drinks approximately a case of beer/day. Pt has been drinking daily for 1 month. Pt shares he has also been using cocaine; he states he last used 1/2 gram last night and that he snorts and smokes the cocaine. Pt has been using cocaine daily for 2 weeks. Pt states he uses Xanax "every so often." He states he last used several days ago and that he typically takes several 586mpills every several days. ? ? ?Do You Currently Have a Therapist/Psychiatrist? No ? ?Name of Therapist/Psychiatrist: No data recorded ? ?Have You Been Recently Discharged From Any Office Practice or Programs? Yes ? ?Explanation of Discharge From Practice/Program: [facility] ? ? ?  ?CCA Screening Triage Referral Assessment ?Type of Contact: Face-to-Face ? ?Telemedicine Service Delivery:   ?Is this Initial or Reassessment? No data recorded ?Date Telepsych consult ordered in CHL:  No data recorded ?Time Telepsych consult ordered in CHL:  No data recorded ?[facility] ? ?Provider [facility] ? ? ?Collateral Involvement: none ? ? ?Does Patient Have a CoStage manageruardian? No data recorded ?Name and Contact of Legal Guardian: No data recorded ?If Minor and Not Living with Parent(s), Who has Custody? N/A ? ?Is CPS involved or ever been involved? -- (u[place]adder? ?Is APS involved or ever been involved? -- (uPincus Badder? ? ?Patient Determined To Be At Risk for Harm To Self or Others Based on Review of Patient Reported Information or Presenting Complaint? No ? ?Method: No data recorded ?Availability of Means: No data recorded ?Intent: No data recorded ?Notification Required: No data recorded ?Additional Information for Danger to Others Potential: No data recorded ?Additional Comments for Danger to Others Potential: No data recorded ?Are There Guns or Other Weapons in Yo[place]No data recorded ?Types of Guns/Weapons: No data recorded ?Are These Weapons Safely Secured?  No data recorded ?Who Could Verify You Are Able To Have These Secured: No data recorded ?Do You Have any Outstanding Charges, Pending Court Dates, Parole/Probation? No data recorded ?Contacted To Inform of Risk of Harm To Self or Others: -- (N/A) ? ? ? ?Does Patient Present under Involuntary Commitment? No ? ?IVC Papers Initial File Date: No data recorded ? ?[place] of Residence: Kathleen Argue ? ? ?Patient Currently Receiving the Following Services: -- (Sober Living at [facility]) ? ? ?Determination of Need: Urgent (48 hours) ? ? ?Options For Referral: Facility-Based Crisis (Alcohol Detox) ? ? ? ? ?CCA Biopsychosocial ?Patient Reported Schizophrenia/Schizoaffective Diagnosis in Past: No ? ? ?Strengths: Pt is voluntarily seeking treatment for his SA. ? ? ?Mental Health Symptoms ?Depression:   ?Change in energy/activity; Difficulty Concentrating; Fatigue; Hopelessness; Worthlessness; Increase/decrease in appetite; Sleep (too much or little) ?  ?Duration of Depressive symptoms:  ?Duration of Depressive Symptoms:  Greater than two weeks ?  ?Mania:   ?None ?  ?Anxiety:    ?Difficulty concentrating; Fatigue; Sleep; Tension; Worrying ?  ?Psychosis:   ?None ?  ?Duration of Psychotic symptoms:    ?Trauma:   ?None ?  ?Obsessions:   ?None ?

## 2021-07-25 NOTE — ED Notes (Signed)
Ekg completed and given to [facility] NP ?

## 2021-07-26 DIAGNOSIS — F10129 Alcohol abuse with intoxication, unspecified: Secondary | ICD-10-CM | POA: Diagnosis not present

## 2021-07-26 DIAGNOSIS — Z20822 Contact with and (suspected) exposure to covid-19: Secondary | ICD-10-CM | POA: Diagnosis not present

## 2021-07-26 DIAGNOSIS — F319 Bipolar disorder, unspecified: Secondary | ICD-10-CM | POA: Diagnosis not present

## 2021-07-26 LAB — HEMOGLOBIN A1C
Hgb A1c MFr Bld: 5.5 % (ref 4.8–5.6)
Mean Plasma Glucose: 111 mg/dL

## 2021-07-26 MED ORDER — LAMOTRIGINE 25 MG PO TABS
25.0000 mg | ORAL_TABLET | Freq: Every day | ORAL | Status: DC
  Administered 2021-07-26 – 2021-07-28 (×3): 25 mg via ORAL
  Filled 2021-07-26: qty 7
  Filled 2021-07-26 (×3): qty 1

## 2021-07-26 NOTE — ED Notes (Signed)
Gave Pt. A snack and turned the TV on. ?

## 2021-07-26 NOTE — ED Notes (Signed)
Pt is currently sleeping, no distress noted, environmental check complete, will continue to monitor patient for safety. ? ?

## 2021-07-26 NOTE — ED Notes (Signed)
Pt provided breakfast this morning.  Denies SI, HI, and AVH at this time.  Reports he has some stomach discomfort and a headache 5/10, PRN provided.  Pt states he was having loose stools yesterday but has not had any episodes today.  Encouraged pt to let staff know if he does so we can assist him.   Breathing is even and unlabored.  Will continue to monitor for safety. ?

## 2021-07-26 NOTE — ED Notes (Signed)
Pt is in dining room attending [place] group ?

## 2021-07-26 NOTE — Clinical Social Work Psych Note (Signed)
LCSW Initial Note ? ?LCSW met with Brendan Ochoa to discuss potential discharge planning. Brendan Ochoa presented with a dysphoric affect, congruent mood, however was pleasant and cooperative. He denied having any SI, HI or AVH at this time. Brendan Ochoa did endorse experiencing some withdrawal symptoms such as chills, tremors, and headaches.  ? ?Brendan Ochoa shared that she came to the [facility] seeking assistance for worsening depressive symptoms, including suicidal ideations related to his chronic substance abuse issues. Brendan Ochoa endorsed drinking alcohol, using cocaine and Xanax as his substances of choice at this time.  ? ?Brendan Ochoa reports drinking a case of beer on a daily basis for the past month, he endorsed snorting and smoking 1/2 gram of cocaine on a daily basis for the past two weeks, Brendan Ochoa also endorsed taking 5g Xanx tablets "here and there" for the last several weeks. He reports he has not had any Xanax in several days.  ? ?Brendan Ochoa shared that he continues to live with his mother, however he continues to "couch surf" with friends. Man reports his mother continues to be supportive. 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  ? ?Brendan Ochoa expressed interest in a long term, faith based residential treatment program. LCSW informed Brendan Ochoa that he would receive resources, however recommended he explore short-term programs as well due to his acute stay in the [facility]. Brendan Ochoa was agreeable with this recommendation.  ? ?LCSW provided [employer] with resources for [employer], [employer], and [employer]. LCSW also referred Brendan Ochoa to [facility] for possible admission.  ? ?Brendan Ochoa denied having any additional questions or concerns at this time.  ? ?LCSW will continue to follow.  ? ? ?Radonna Ricker, MSW, LCSW ?Clinical Education officer, museum Insurance claims handler) ?[facility]  ? ?

## 2021-07-26 NOTE — ED Notes (Signed)
PT has been brought on unit and familiarized with unit, and nutrition provided. ?

## 2021-07-26 NOTE — ED Notes (Signed)
Pt is attending AA tonight.  ?

## 2021-07-26 NOTE — ED Notes (Addendum)
Pt is currently sleeping, no distress noted, environmental check complete, will continue to monitor patient for safety. ? ?

## 2021-07-26 NOTE — ED Provider Notes (Signed)
Behavioral Health Progress Note ? ?Date and Time: 07/26/2021 4:20 PM ?Name: Brendan Ochoa ?MRN:  [id number] ? ?Subjective:   ?46 year old male with history of substance use disorder, alcohol use disorder, MDD,  cocaine abuse who presented to the [facility] on 1/26 for assistance with alcohol detox and was admitted to the [facility] for alcohol detox and placed on CIWA protocol and restarted on gabapentin. UDS+benzo, cocaine and marijuna. Etoh 124.  ? ?Patient seen and chart reviewed.  Has been medication compliant and has been appropriate with peers and staff on the unit.  Most recent CIWA 0.  Patient describes his mood as normal as "a little better".  He reports alcohol withdrawal sx  symptoms, headache,chills, sweats, GI upset and mild tremors. He denies SI/HI/AVH. He states that he is interested in substance use treatment after completing period of detox. He states that he would be interested in faith based programs  is possible. Discussed with patient that I will notify LCSW of preferences. Patient states that he is concerned that the gabapentin may be causing his headache; however, he goes on to describe tolerating medications well upon discharge and primarily experiencing symptoms when he presents for detox. Discussed the possibility of HA being more related to etoh detox. Patietn states that he has a history of bipolar disorder and that he has been on lamictal in the past and expresses desire to restart this medication as it helped for his mood in the past; he stated that he had been at a dose of 50 mg maximum. Based on history it is unclear if patient has experienced true manic/hypomanic episode in the context of sobriety.  ? ?Diagnosis:  ?Final diagnoses:  ?Alcohol abuse with intoxication ([place])  ? ? ?Total Time spent with patient: 30 minutes ? ?Past Psychiatric History: SIMD, bipolar diosorder, polysubstance abuse, aud ?Past Medical History:  ?Past Medical History:  ?Diagnosis Date  ? Asthma   ? Cocaine abuse ([place])    ? in past  ? Crohn disease ([place]) 2008  ? Depression   ? in past, not current as of 2019   ? ETOH abuse   ? history of alcohol use  ? GERD (gastroesophageal reflux disease)   ?  ?Past Surgical History:  ?Procedure Laterality Date  ? BIOPSY  10/15/2017  ? Procedure: BIOPSY;  Surgeon: Danie Binder, MD;  Location: [facility];  Service: Endoscopy;;  terminal ileum ?colon  ? COLONOSCOPY WITH PROPOFOL N/A 10/15/2017  ? redundant colon, normal rectum, external and internal hemorrhoids, quiescent crohn's. Pathology: inactive chronicn nonspecific ileitis  ? ESOPHAGOGASTRODUODENOSCOPY (EGD) WITH PROPOFOL N/A 10/15/2017  ? normal esophagus, small hiatal hernia, gastritis, duodenitis  ? FINGER AMPUTATION Right 2012  ? cut finger table saw and surgically amputated  ? ?Family History:  ?Family History  ?Problem Relation Age of Onset  ? Stomach cancer Maternal Grandfather   ? Colon cancer Neg Hx   ? ?Family Psychiatric  History: see H&P ?Social History:  ?Social History  ? ?Substance and Sexual Activity  ?Alcohol Use Not Currently  ? Comment: alcoholic- last use 0/63/01  ?   ?Social History  ? ?Substance and Sexual Activity  ?Drug Use Not Currently  ? Types: Cocaine, Marijuana, Methamphetamines  ? Comment: last cocaine use 10/10/20.  last meth april 2022  ?  ?Social History  ? ?Socioeconomic History  ? Marital status: Single  ?  Spouse name: Not on file  ? Number of children: Not on file  ? Years of education: Not on file  ?  Highest education level: Not on file  ?Occupational History  ? Not on file  ?Tobacco Use  ? Smoking status: Every Day  ?  Packs/day: 0.50  ?  Years: 15.00  ?  Pack years: 7.50  ?  Types: Cigarettes  ? Smokeless tobacco: Never  ? Tobacco comments:  ?  1/4 to 1/2 pack per day   ?Vaping Use  ? Vaping Use: Never used  ?Substance and Sexual Activity  ? Alcohol use: Not Currently  ?  Comment: alcoholic- last use 9/38/18  ? Drug use: Not Currently  ?  Types: Cocaine, Marijuana, Methamphetamines  ?  Comment: last  cocaine use 10/10/20.  last meth april 2022  ? Sexual activity: Not on file  ?Other Topics Concern  ? Not on file  ?Social History Narrative  ? Not on file  ? ?Social Determinants of Health  ? ?Financial Resource Strain: Not on file  ?Food Insecurity: Not on file  ?Transportation Needs: Not on file  ?Physical Activity: Not on file  ?Stress: Not on file  ?Social Connections: Not on file  ? ?SDOH:  ?SDOH Screenings  ? ?Alcohol Screen: Not on file  ?Depression (PHQ2-9): Medium Risk  ? PHQ-2 Score: 9  ?Financial Resource Strain: Not on file  ?Food Insecurity: Not on file  ?Housing: Not on file  ?Physical Activity: Not on file  ?Social Connections: Not on file  ?Stress: Not on file  ?Tobacco Use: High Risk  ? Smoking Tobacco Use: Every Day  ? Smokeless Tobacco Use: Never  ? Passive Exposure: Not on file  ?Transportation Needs: Not on file  ? ?Additional Social History:  ?  ?Pain Medications: See MAR ?Prescriptions: See MAR ?Over the Counter: See MAR ?History of alcohol / drug use?: Yes ?Longest period of sobriety (when/how long): 2 years sober approximately 10 years ago ?Negative Consequences of Use: Personal relationships, Legal, Financial ?Withdrawal Symptoms: Agitation, Sweats, Weakness, Nausea / Vomiting, Irritability, Fever / Chills, DTs, Patient aware of relationship between substance abuse and physical/medical complications, Tremors ?Name of Substance 1: alcohol ?1 - Age of First Use: unknown ?1 - Amount (size/oz): 1 case of beer a day plus multiple shots of liquor daily ?1 - Frequency: daily ?1 - Duration: ongoing ?1 - Last Use / Amount: today 2 hours before coming to [facility] ?1 - Method of Aquiring: unknown ?1- Route of Use: drink/oral ?Name of Substance 2: cocaine ?2 - Age of First Use: unknown ?2 - Amount (size/oz): varies usually 1 gram ?2 - Frequency: daily ?2 - Duration: Ongoing ?2 - Last Use / Amount: today ?2 - Method of Aquiring: unknown ?2 - Route of Substance Use: snort/smoke ?Name of Substance 3:  marijuana ?3 - Age of First Use: dailyx 6-7 weeks ?3 - Amount (size/oz): 2 grams ?3 - Frequency: daily ?3 - Last Use / Amount: unknown ?  ?  ?  ?  ?  ?  ? ?Sleep: Fair ? ?Appetite:  Fair ? ?Current Medications:  ?Current Facility-Administered Medications  ?Medication Dose Route Frequency Provider Last Rate Last Admin  ? acetaminophen (TYLENOL) tablet 650 mg  650 mg Oral Q6H PRN Revonda Humphrey, NP      ? alum & mag hydroxide-simeth (MAALOX/MYLANTA) 200-200-20 MG/5ML suspension 30 mL  30 mL Oral Q4H PRN Revonda Humphrey, NP   30 mL at 07/26/21 0015  ? gabapentin (NEURONTIN) capsule 100 mg  100 mg Oral TID Revonda Humphrey, NP   100 mg at 07/26/21 1544  ? hydrOXYzine (ATARAX)  tablet 25 mg  25 mg Oral Q6H PRN Revonda Humphrey, NP   25 mg at 07/25/21 1537  ? ibuprofen (ADVIL) tablet 600 mg  600 mg Oral Q6H PRN Revonda Humphrey, NP   600 mg at 07/26/21 9323  ? lamoTRIgine (LAMICTAL) tablet 25 mg  25 mg Oral Daily Ival Bible, MD   25 mg at 07/26/21 1430  ? loperamide (IMODIUM) capsule 2-4 mg  2-4 mg Oral PRN Revonda Humphrey, NP      ? LORazepam (ATIVAN) tablet 1 mg  1 mg Oral Q6H PRN Revonda Humphrey, NP   1 mg at 07/25/21 2051  ? magnesium hydroxide (MILK OF MAGNESIA) suspension 30 mL  30 mL Oral Daily PRN Revonda Humphrey, NP      ? multivitamin with minerals tablet 1 tablet  1 tablet Oral Daily Revonda Humphrey, NP   1 tablet at 07/26/21 5573  ? nicotine (NICODERM CQ - dosed in mg/24 hours) patch 21 mg  21 mg Transdermal Once Rankin, Shuvon B, NP   21 mg at 07/25/21 2050  ? ondansetron (ZOFRAN-ODT) disintegrating tablet 4 mg  4 mg Oral Q6H PRN Revonda Humphrey, NP      ? thiamine (B-1) injection 100 mg  100 mg Intramuscular Once Revonda Humphrey, NP      ? thiamine tablet 100 mg  100 mg Oral Daily Revonda Humphrey, NP   100 mg at 07/26/21 2202  ? traZODone (DESYREL) tablet 50 mg  50 mg Oral QHS PRN Revonda Humphrey, NP   50 mg at 07/26/21 0015  ? ?Current Outpatient Medications   ?Medication Sig Dispense Refill  ? albuterol (VENTOLIN HFA) 108 (90 Base) MCG/ACT inhaler Inhale 2 puffs into the lungs every 6 (six) hours as needed for wheezing or shortness of breath. 8 g 0  ? gabapentin (NEURONT

## 2021-07-27 DIAGNOSIS — F319 Bipolar disorder, unspecified: Secondary | ICD-10-CM | POA: Diagnosis not present

## 2021-07-27 DIAGNOSIS — Z20822 Contact with and (suspected) exposure to covid-19: Secondary | ICD-10-CM | POA: Diagnosis not present

## 2021-07-27 DIAGNOSIS — F10129 Alcohol abuse with intoxication, unspecified: Secondary | ICD-10-CM | POA: Diagnosis not present

## 2021-07-27 MED ORDER — TRAZODONE HCL 50 MG PO TABS
50.0000 mg | ORAL_TABLET | Freq: Every evening | ORAL | Status: DC | PRN
  Administered 2021-07-27 (×2): 50 mg via ORAL
  Filled 2021-07-27: qty 7
  Filled 2021-07-27 (×2): qty 1

## 2021-07-27 NOTE — Progress Notes (Signed)
Brendan Ochoa got up and ate lunch. He c/o having a headache, aches and pains and an upset stomach. He was medicated per order after lunch and returned to his room. ?

## 2021-07-27 NOTE — ED Notes (Signed)
Snack given.

## 2021-07-27 NOTE — ED Notes (Signed)
Pt asleep in bed. Respirations even and unlabored. Will continue to monitor for safety. ?

## 2021-07-27 NOTE — ED Notes (Signed)
Breakfast given.  ?

## 2021-07-27 NOTE — ED Provider Notes (Signed)
Behavioral Health Progress Note ? ?Date and Time: 07/27/2021 2:50 PM ?Name: Brendan Ochoa ?MRN:  [id number] ? ?Subjective:   ?46 year old male with history of substance use disorder, alcohol use disorder, MDD,  cocaine abuse who presented to the [facility] on 1/26 for assistance with alcohol detox and was admitted to the [facility] for alcohol detox and placed on CIWA protocol and restarted on gabapentin. UDS+benzo, cocaine and marijuna. Etoh 124.  ? ?Patient seen and chart reviewed.  Has been medication compliant and has been appropriate with peers and staff on the unit.  Most recent CIWA 3 d/t HA. Patient is found laying in bed this afternoon in NAD.  Patient describes his mood as "a little better" than yesterday. He reports withdrawal sx of headache which has improved after receiving motrin, GI upset, diarrhea and nausea. He denies vomiting, tremors. He also reports heart burn. Patient states that he has spoken to his probation officer and family since he has been here and continues to express interest in going to a faith based treatment facility; however, he does report that he has an upcoming court date on Tuesday. Patient denies SI/IH/AVH. Patient verbalizes understanding that he is not eligible to be accepted to a treatment facility directly from the [facility] due to court date being a barrier.  Discussed with patient that he has PRN medication ordered to help with withdrawal symptoms but that he will need to ask for it. Patient verbalizes understanding. ? ?Diagnosis:  ?Final diagnoses:  ?Alcohol abuse with intoxication ([place])  ? ? ?Total Time spent with patient: 20 minutes ? ?Past Psychiatric History: SIMD, bipolar diosorder, polysubstance abuse, aud ?Past Medical History:  ?Past Medical History:  ?Diagnosis Date  ? Asthma   ? Cocaine abuse ([place])   ? in past  ? Crohn disease ([place]) 2008  ? Depression   ? in past, not current as of 2019   ? ETOH abuse   ? history of alcohol use  ? GERD (gastroesophageal reflux disease)   ?   ?Past Surgical History:  ?Procedure Laterality Date  ? BIOPSY  10/15/2017  ? Procedure: BIOPSY;  Surgeon: Danie Binder, MD;  [facility];  Service: Endoscopy;;  terminal ileum ?colon  ? COLONOSCOPY WITH PROPOFOL N/A 10/15/2017  ? redundant colon, normal rectum, external and internal hemorrhoids, quiescent crohn's. Pathology: inactive chronicn nonspecific ileitis  ? ESOPHAGOGASTRODUODENOSCOPY (EGD) WITH PROPOFOL N/A 10/15/2017  ? normal esophagus, small hiatal hernia, gastritis, duodenitis  ? FINGER AMPUTATION Right 2012  ? cut finger table saw and surgically amputated  ? ?Family History:  ?Family History  ?Problem Relation Age of Onset  ? Stomach cancer Maternal Grandfather   ? Colon cancer Neg Hx   ? ?Family Psychiatric  History: see H&P ?Social History:  ?Social History  ? ?Substance and Sexual Activity  ?Alcohol Use Not Currently  ? Comment: alcoholic- last use 0/30/09  ?   ?Social History  ? ?Substance and Sexual Activity  ?Drug Use Not Currently  ? Types: Cocaine, Marijuana, Methamphetamines  ? Comment: last cocaine use 10/10/20.  last meth april 2022  ?  ?Social History  ? ?Socioeconomic History  ? Marital status: Single  ?  Spouse name: Not on file  ? Number of children: Not on file  ? Years of education: Not on file  ? Highest education level: Not on file  ?Occupational History  ? Not on file  ?Tobacco Use  ? Smoking status: Every Day  ?  Packs/day: 0.50  ?  Years: 15.00  ?  Pack years: 7.50  ?  Types: Cigarettes  ? Smokeless tobacco: Never  ? Tobacco comments:  ?  1/4 to 1/2 pack per day   ?Vaping Use  ? Vaping Use: Never used  ?Substance and Sexual Activity  ? Alcohol use: Not Currently  ?  Comment: alcoholic- last use 08/14/45  ? Drug use: Not Currently  ?  Types: Cocaine, Marijuana, Methamphetamines  ?  Comment: last cocaine use 10/10/20.  last meth april 2022  ? Sexual activity: Not on file  ?Other Topics Concern  ? Not on file  ?Social History Narrative  ? Not on file  ? ?Social Determinants of  Health  ? ?Financial Resource Strain: Not on file  ?Food Insecurity: Not on file  ?Transportation Needs: Not on file  ?Physical Activity: Not on file  ?Stress: Not on file  ?Social Connections: Not on file  ? ?SDOH:  ?SDOH Screenings  ? ?Alcohol Screen: Not on file  ?Depression (PHQ2-9): Medium Risk  ? PHQ-2 Score: 9  ?Financial Resource Strain: Not on file  ?Food Insecurity: Not on file  ?Housing: Not on file  ?Physical Activity: Not on file  ?Social Connections: Not on file  ?Stress: Not on file  ?Tobacco Use: High Risk  ? Smoking Tobacco Use: Every Day  ? Smokeless Tobacco Use: Never  ? Passive Exposure: Not on file  ?Transportation Needs: Not on file  ? ?Additional Social History:  ?  ?Pain Medications: See MAR ?Prescriptions: See MAR ?Over the Counter: See MAR ?History of alcohol / drug use?: Yes ?Longest period of sobriety (when/how long): 2 years sober approximately 10 years ago ?Negative Consequences of Use: Personal relationships, Legal, Financial ?Withdrawal Symptoms: Agitation, Sweats, Weakness, Nausea / Vomiting, Irritability, Fever / Chills, DTs, Patient aware of relationship between substance abuse and physical/medical complications, Tremors ?Name of Substance 1: alcohol ?1 - Age of First Use: unknown ?1 - Amount (size/oz): 1 case of beer a day plus multiple shots of liquor daily ?1 - Frequency: daily ?1 - Duration: ongoing ?1 - Last Use / Amount: today 2 hours before coming to [facility] ?1 - Method of Aquiring: unknown ?1- Route of Use: drink/oral ?Name of Substance 2: cocaine ?2 - Age of First Use: unknown ?2 - Amount (size/oz): varies usually 1 gram ?2 - Frequency: daily ?2 - Duration: Ongoing ?2 - Last Use / Amount: today ?2 - Method of Aquiring: unknown ?2 - Route of Substance Use: snort/smoke ?Name of Substance 3: marijuana ?3 - Age of First Use: dailyx 6-7 weeks ?3 - Amount (size/oz): 2 grams ?3 - Frequency: daily ?3 - Last Use / Amount: unknown ?  ?  ?  ?  ?  ?  ? ?Sleep: Fair ? ?Appetite:   Fair ? ?Current Medications:  ?Current Facility-Administered Medications  ?Medication Dose Route Frequency Provider Last Rate Last Admin  ? acetaminophen (TYLENOL) tablet 650 mg  650 mg Oral Q6H PRN Revonda Humphrey, NP      ? alum & mag hydroxide-simeth (MAALOX/MYLANTA) 200-200-20 MG/5ML suspension 30 mL  30 mL Oral Q4H PRN Revonda Humphrey, NP   30 mL at 07/27/21 1316  ? gabapentin (NEURONTIN) capsule 100 mg  100 mg Oral TID Revonda Humphrey, NP   100 mg at 07/27/21 1037  ? hydrOXYzine (ATARAX) tablet 25 mg  25 mg Oral Q6H PRN Revonda Humphrey, NP   25 mg at 07/26/21 1824  ? ibuprofen (ADVIL) tablet 600 mg  600 mg Oral  Q6H PRN Revonda Humphrey, NP   600 mg at 07/27/21 1229  ? lamoTRIgine (LAMICTAL) tablet 25 mg  25 mg Oral Daily Ival Bible, MD   25 mg at 07/27/21 1035  ? loperamide (IMODIUM) capsule 2-4 mg  2-4 mg Oral PRN Revonda Humphrey, NP      ? LORazepam (ATIVAN) tablet 1 mg  1 mg Oral Q6H PRN Revonda Humphrey, NP   1 mg at 07/25/21 2051  ? magnesium hydroxide (MILK OF MAGNESIA) suspension 30 mL  30 mL Oral Daily PRN Revonda Humphrey, NP      ? multivitamin with minerals tablet 1 tablet  1 tablet Oral Daily Revonda Humphrey, NP   1 tablet at 07/27/21 1035  ? ondansetron (ZOFRAN-ODT) disintegrating tablet 4 mg  4 mg Oral Q6H PRN Revonda Humphrey, NP      ? thiamine (B-1) injection 100 mg  100 mg Intramuscular Once Revonda Humphrey, NP      ? thiamine tablet 100 mg  100 mg Oral Daily Revonda Humphrey, NP   100 mg at 07/27/21 1035  ? traZODone (DESYREL) tablet 50 mg  50 mg Oral QHS PRN Revonda Humphrey, NP   50 mg at 07/26/21 2110  ? ?Current Outpatient Medications  ?Medication Sig Dispense Refill  ? albuterol (VENTOLIN HFA) 108 (90 Base) MCG/ACT inhaler Inhale 2 puffs into the lungs every 6 (six) hours as needed for wheezing or shortness of breath. 8 g 0  ? gabapentin (NEURONTIN) 300 MG capsule Take 1 capsule (300 mg total) by mouth 3 (three) times daily. 90 capsule 0  ?  hydrOXYzine (ATARAX) 25 MG tablet Take 1 tablet (25 mg total) by mouth 3 (three) times daily as needed for anxiety. 60 tablet 0  ? lamoTRIgine (LAMICTAL) 25 MG tablet Take 50 mg by mouth daily.    ? Multiple Vitami

## 2021-07-27 NOTE — ED Notes (Signed)
Pt on phone by nurse's station. A&O x4, calm and cooperative. Denies current SI/HI/AVH. Denies any current needs. No signs of acute distress noted. Will continue to monitor for safety. ?

## 2021-07-27 NOTE — Progress Notes (Signed)
Penny was OOB in the milieu throughout the day at [employer] and attending the groups. He was medicated x2 for indigestion. He made several calls throughout the day. ?

## 2021-07-27 NOTE — ED Notes (Signed)
Pt is currently sleeping, no distress noted, environmental check complete, will continue to monitor patient for safety. ? ?

## 2021-07-27 NOTE — ED Notes (Signed)
Pt participating in MHT-lead group in dayroom. ?

## 2021-07-27 NOTE — Progress Notes (Signed)
Received Brendan Ochoa this AM asleep in his bed, he woke up on his own, received breakfast and returned to bed. He got up for group and received his medications. He stated feeling better today related to the withdrawal symptoms. He made a phone call and returned to his room. ?

## 2021-07-27 NOTE — ED Notes (Signed)
Lunch given.

## 2021-07-27 NOTE — Group Note (Signed)
Group Topic: Balance in Life  ?Group Date: 07/27/2021 ?Start Time: 1000 ?End Time: 1045 ?Facilitators: Adaline Sill D, NT  ?Department: [facility] ? ?Number of Participants: 2  ?Group Focus: acceptance, co-dependency, communication, coping skills, daily focus, self-awareness, and self-esteem ?Treatment Modality:  Behavior Modification Therapy, Individual Therapy, Skills Training, and Spiritual ?Interventions utilized were clarification, mental fitness, problem solving, reminiscence, and story telling ?Purpose: enhance coping skills, express feelings, increase insight, and trigger / craving management ? ?Name: Brendan Ochoa Date of Birth: [birth year]  ?MR: [id number]   ? ?Level of Participation: active ?Quality of Participation: cooperative, engaged, motivated, and supportive ?Interactions with others: gave feedback ?Mood/Affect: appropriate and bright ?Triggers (if applicable): pressure  ?Cognition: goal directed and insightful ?Progress: Moderate ?Response: enjoyed the group. ?Plan: patient will be encouraged to set small goals, get around positive people and know your limit.  ? ?Patients Problems:  ?Patient Active Problem List  ? Diagnosis Date Noted  ? Alcohol use disorder, severe, dependence ([place])   ? Cocaine use disorder, severe, dependence ([place])   ? Cannabis use disorder, severe, dependence ([place])   ? Alcohol abuse with withdrawal ([place]) 04/27/2021  ? Acute encephalopathy   ? Hypokalemia   ? Tobacco abuse   ? 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 Epididymal cyst 06/14/2020  ? Unintentional weight loss 06/14/2020  ? Abnormal chest x-ray 06/14/2020  ? Elevated blood pressure reading without diagnosis of hypertension 05/27/2020  ? Influenza vaccination declined 05/27/2020  ? AKI (acute kidney injury) ([place]) 01/01/2020  ? Vomiting 01/01/2020  ? Cocaine abuse  ([place]) 01/01/2020  ? Alcohol abuse 01/01/2020  ? Ileitis 02/18/2018  ? HYPERGLYCEMIA 11/01/2009  ? Tobacco dependence 07/18/2007  ? SUBSTANCE ABUSE, MULTIPLE 07/18/2007  ? ALLERGIC RHINITIS 07/18/2007  ? ASTHMA 07/18/2007  ? GERD 07/18/2007  ? Crohn's disease ([place]) 07/18/2007  ?  ?

## 2021-07-27 NOTE — ED Notes (Signed)
Dinner given.  ?

## 2021-07-28 DIAGNOSIS — Z20822 Contact with and (suspected) exposure to covid-19: Secondary | ICD-10-CM | POA: Diagnosis not present

## 2021-07-28 DIAGNOSIS — F319 Bipolar disorder, unspecified: Secondary | ICD-10-CM | POA: Diagnosis not present

## 2021-07-28 DIAGNOSIS — F10129 Alcohol abuse with intoxication, unspecified: Secondary | ICD-10-CM | POA: Diagnosis not present

## 2021-07-28 MED ORDER — HYDROXYZINE HCL 25 MG PO TABS
25.0000 mg | ORAL_TABLET | Freq: Three times a day (TID) | ORAL | Status: DC | PRN
  Filled 2021-07-28: qty 10

## 2021-07-28 MED ORDER — ALBUTEROL SULFATE HFA 108 (90 BASE) MCG/ACT IN AERS: 2.0000 | INHALATION_SPRAY | Freq: Four times a day (QID) | RESPIRATORY_TRACT | 1 refills | Status: DC | PRN

## 2021-07-28 MED ORDER — ALBUTEROL SULFATE HFA 108 (90 BASE) MCG/ACT IN AERS
2.0000 | INHALATION_SPRAY | Freq: Four times a day (QID) | RESPIRATORY_TRACT | Status: DC | PRN
  Filled 2021-07-28: qty 6.7

## 2021-07-28 MED ORDER — TRAZODONE HCL 50 MG PO TABS
50.0000 mg | ORAL_TABLET | Freq: Every evening | ORAL | 1 refills | Status: DC | PRN
Start: 2021-07-28 — End: 2022-05-03

## 2021-07-28 MED ORDER — ADULT MULTIVITAMIN W/MINERALS CH: 1.0000 | ORAL_TABLET | Freq: Every day | ORAL | 1 refills | Status: DC

## 2021-07-28 MED ORDER — LAMOTRIGINE 25 MG PO TABS: 25.0000 mg | ORAL_TABLET | Freq: Every day | ORAL | 1 refills | Status: DC

## 2021-07-28 MED ORDER — HYDROXYZINE HCL 25 MG PO TABS: 25.0000 mg | ORAL_TABLET | Freq: Three times a day (TID) | ORAL | 0 refills | Status: DC | PRN

## 2021-07-28 MED ORDER — GABAPENTIN 100 MG PO CAPS
100.0000 mg | ORAL_CAPSULE | Freq: Three times a day (TID) | ORAL | 1 refills | Status: DC
Start: 2021-07-28 — End: 2021-12-02

## 2021-07-28 NOTE — Discharge Instructions (Signed)
Take all medications as prescribed by his/her mental healthcare provider. ?Report any adverse effects and or reactions from the medicines to your outpatient provider promptly. ?Do not engage in alcohol and or illegal drug use while on prescription medicines. ?In the event of worsening symptoms, call the crisis hotline, 911 and or go to the nearest ED for appropriate evaluation and treatment of symptoms. ?follow-up with your primary care provider for your other medical issues, concerns and or health care needs. ? ?

## 2021-07-28 NOTE — ED Notes (Signed)
Breakfast given.  ?

## 2021-07-28 NOTE — ED Notes (Signed)
Lunch given.

## 2021-07-28 NOTE — Clinical Social Work Psych Note (Signed)
LCSW Update/Discharge Note ? ?[name] reports he feels ready to discharge from the [facility] today. He reports he feels better and wants to prepare himself for court, which he has on Tuesday, 08/01/21.  ? ?Brendan Ochoa reports he plans to live with family support temporarily, however after handling his legal matters next Tuesday, he plans to participate in the [facility]'s  long-term residential treatment program. He reports he is starting the program next Wednesday or Thursday.  ? ?LCSW provided Brendan Ochoa with additional resources as well.  ? ?Brendan Ochoa reports he has a family support that will be able to pick him up from the [facility] today, after lunch, around 2:00pm.  ? ?Brendan Ochoa shared that these family members are more supportive and have a "better" environment that will encourage sobriety.  ? ?Brendan Ochoa denied having any additional questions or concerns at this time.  ? ?LCSW will continue to follow until discharge.  ? ? ?Radonna Ricker, MSW, LCSW ?Clinical Education officer, [employer] claims handler) ?[facility]  ? ?

## 2021-07-28 NOTE — Progress Notes (Signed)
Pt is awake, alert and oriented. Pt complained of mild headache. No signs of acute distress noted. PRN Tylenol and scheduled meds administered with no issue. Pt denies current SI/HI/AVH. Staff will monitor for pt's safety. ?

## 2021-07-28 NOTE — ED Provider Notes (Signed)
FBC/OBS ASAP Discharge Summary ? ?Date and Time: 07/28/2021 12:32 PM  ?Name: Brendan Ochoa  ?MRN:  [id number]  ? ?Discharge Diagnoses:  ?Final diagnoses:  ?Alcohol abuse with intoxication ([place])  ? ? ?Subjective:  ?Patient seen and chart reviewed. He has been medication compliant and has been appropriate with peers and staff on the unit. He denies SI/HI/AVH. He reports HA which has been improving. He denies tremors, n/v, diaphoresis. He describes his mood as "a lot better" and inquires about discharge. Patient states that a family member will pick him up at 2 pm today and that he plans on going to his court date on Tuesday and then will have a family member transport him to [facility]. Patient states that [facility] is a faith based rehab that has "Safe houses" for individuals to stay in until a bed opens up. He reports some difficulties sleeping last night. Denies issues with appetite. Denies other physical complaints. ? ?On my interview, patient is in NAD, alert, oriented, calm, cooperative, and attentive, with normal affect, speech, and behavior. Objectively, there is no evidence of psychosis/ mania (able to converse coherently, linear and goal directed thought, no RIS, no distractibility, not pre-occupied, no FOI, etc) nor depression to the point of suicidality (able to concentrate, affect full and reactive, speech normal r/v/t, no psychomotor retardation/agitation, etc). ? ?Overall, patient appears to be at the point, in the absence of inhibiting or disinhibiting symptoms, where he can successfully move to lesser restrictive setting for care. ? ? ?Stay Summary:  ?46 year old male with history of substance use disorder, alcohol use disorder, MDD,  cocaine abuse who presented to the [facility] on 3/14 for assistance with alcohol detox and was admitted to the [facility] for alcohol detox and placed on CIWA protocol and restarted on gabapentin. UDS+benzo, cocaine and marijuna. Etoh 124. Patient CIWA was highest at  7 on day of presentation-remaining CIWA scores between 0-3. Patient was start on lamictal on 3/15 for mood sx. Patient tolerated al medications well and had completed detox and requested discharge on 3/17. ? ?Total Time spent with patient: 20 minutes ? ?Past Psychiatric History: SIMD bs bipolar disorder, substance abuse ?Past Medical History:  ?Past Medical History:  ?Diagnosis Date  ? Asthma   ? Cocaine abuse ([place])   ? in past  ? Crohn disease ([place]) 2008  ? Depression   ? in past, not current as of 2019   ? ETOH abuse   ? history of alcohol use  ? GERD (gastroesophageal reflux disease)   ?  ?Past Surgical History:  ?Procedure Laterality Date  ? BIOPSY  10/15/2017  ? Procedure: BIOPSY;  Surgeon: Danie Binder, MD;  [facility];  Service: Endoscopy;;  terminal ileum ?colon  ? COLONOSCOPY WITH PROPOFOL N/A 10/15/2017  ? redundant colon, normal rectum, external and internal hemorrhoids, quiescent crohn's. Pathology: inactive chronicn nonspecific ileitis  ? ESOPHAGOGASTRODUODENOSCOPY (EGD) WITH PROPOFOL N/A 10/15/2017  ? normal esophagus, small hiatal hernia, gastritis, duodenitis  ? FINGER AMPUTATION Right 2012  ? cut finger table saw and surgically amputated  ? ?Family History:  ?Family History  ?Problem Relation Age of Onset  ? Stomach cancer Maternal Grandfather   ? Colon cancer Neg Hx   ? ?Family Psychiatric History: see h&P ?Social History:  ?Social History  ? ?Substance and Sexual Activity  ?Alcohol Use Not Currently  ? Comment: alcoholic- last use 3/64/68  ?   ?Social History  ? ?Substance and Sexual Activity  ?Drug Use Not Currently  ?  Types: Cocaine, Marijuana, Methamphetamines  ? Comment: last cocaine use 10/10/20.  last meth april 2022  ?  ?Social History  ? ?Socioeconomic History  ? Marital status: Single  ?  Spouse name: Not on file  ? Number of children: Not on file  ? Years of education: Not on file  ? Highest education level: Not on file  ?Occupational History  ? Not on file  ?Tobacco Use  ?  Smoking status: Every Day  ?  Packs/day: 0.50  ?  Years: 15.00  ?  Pack years: 7.50  ?  Types: Cigarettes  ? Smokeless tobacco: Never  ? Tobacco comments:  ?  1/4 to 1/2 pack per day   ?Vaping Use  ? Vaping Use: Never used  ?Substance and Sexual Activity  ? Alcohol use: Not Currently  ?  Comment: alcoholic- last use 1/32/44  ? Drug use: Not Currently  ?  Types: Cocaine, Marijuana, Methamphetamines  ?  Comment: last cocaine use 10/10/20.  last meth april 2022  ? Sexual activity: Not on file  ?Other Topics Concern  ? Not on file  ?Social History Narrative  ? Not on file  ? ?Social Determinants of Health  ? ?Financial Resource Strain: Not on file  ?Food Insecurity: Not on file  ?Transportation Needs: Not on file  ?Physical Activity: Not on file  ?Stress: Not on file  ?Social Connections: Not on file  ? ?SDOH:  ?SDOH Screenings  ? ?Alcohol Screen: Not on file  ?Depression (PHQ2-9): Medium Risk  ? PHQ-2 Score: 9  ?Financial Resource Strain: Not on file  ?Food Insecurity: Not on file  ?Housing: Not on file  ?Physical Activity: Not on file  ?Social Connections: Not on file  ?Stress: Not on file  ?Tobacco Use: High Risk  ? Smoking Tobacco Use: Every Day  ? Smokeless Tobacco Use: Never  ? Passive Exposure: Not on file  ?Transportation Needs: Not on file  ? ? ?Tobacco Cessation:  N/A, patient does not currently use tobacco products ? ?Current Medications:  ?Current Facility-Administered Medications  ?Medication Dose Route Frequency Provider Last Rate Last Admin  ? acetaminophen (TYLENOL) tablet 650 mg  650 mg Oral Q6H PRN Revonda Humphrey, NP   650 mg at 07/28/21 0102  ? alum & mag hydroxide-simeth (MAALOX/MYLANTA) 200-200-20 MG/5ML suspension 30 mL  30 mL Oral Q4H PRN Revonda Humphrey, NP   30 mL at 07/27/21 1817  ? gabapentin (NEURONTIN) capsule 100 mg  100 mg Oral TID Revonda Humphrey, NP   100 mg at 07/28/21 0841  ? hydrOXYzine (ATARAX) tablet 25 mg  25 mg Oral Q6H PRN Revonda Humphrey, NP   25 mg at 07/26/21 1824   ? ibuprofen (ADVIL) tablet 600 mg  600 mg Oral Q6H PRN Revonda Humphrey, NP   600 mg at 07/27/21 1229  ? lamoTRIgine (LAMICTAL) tablet 25 mg  25 mg Oral Daily Ival Bible, MD   25 mg at 07/28/21 0840  ? loperamide (IMODIUM) capsule 2-4 mg  2-4 mg Oral PRN Revonda Humphrey, NP      ? LORazepam (ATIVAN) tablet 1 mg  1 mg Oral Q6H PRN Revonda Humphrey, NP   1 mg at 07/25/21 2051  ? magnesium hydroxide (MILK OF MAGNESIA) suspension 30 mL  30 mL Oral Daily PRN Revonda Humphrey, NP      ? multivitamin with minerals tablet 1 tablet  1 tablet Oral Daily Revonda Humphrey, NP   1 tablet at  07/28/21 0840  ? ondansetron (ZOFRAN-ODT) disintegrating tablet 4 mg  4 mg Oral Q6H PRN Revonda Humphrey, NP      ? thiamine (B-1) injection 100 mg  100 mg Intramuscular Once Revonda Humphrey, NP      ? thiamine tablet 100 mg  100 mg Oral Daily Revonda Humphrey, NP   100 mg at 07/28/21 0840  ? traZODone (DESYREL) tablet 50 mg  50 mg Oral QHS PRN,MR X 1 Lindon Romp A, NP   50 mg at 07/27/21 2147  ? ?Current Outpatient Medications  ?Medication Sig Dispense Refill  ? albuterol (VENTOLIN HFA) 108 (90 Base) MCG/ACT inhaler Inhale 2 puffs into the lungs every 6 (six) hours as needed for wheezing or shortness of breath. 8 g 1  ? gabapentin (NEURONTIN) 100 MG capsule Take 1 capsule (100 mg total) by mouth 3 (three) times daily. 90 capsule 1  ? hydrOXYzine (ATARAX) 25 MG tablet Take 1 tablet (25 mg total) by mouth 3 (three) times daily as needed for anxiety. 60 tablet 0  ? [START ON 07/29/2021] lamoTRIgine (LAMICTAL) 25 MG tablet Take 1 tablet (25 mg total) by mouth daily. 30 tablet 1  ? Multiple Vitamin (MULTIVITAMIN WITH MINERALS) TABS tablet Take 1 tablet by mouth daily. 30 tablet 1  ? traZODone (DESYREL) 50 MG tablet Take 1 tablet (50 mg total) by mouth at bedtime as needed for sleep. 30 tablet 1  ? ? ?PTA Medications: (Not in a hospital admission) ? ? ?Musculoskeletal  ?Strength & Muscle Tone: within normal  limits ?Gait & Station: normal ?Patient leans: N/A ? ?Psychiatric Specialty Exam  ?Presentation  ?General Appearance: Appropriate for Environment; Casual ? ?Eye Contact:Good ? ?Speech:Clear and Coherent; Normal Rat

## 2021-07-28 NOTE — Discharge Summary (Signed)
Brendan Ochoa Cousin to be D/C'd Home per MD order. Discussed with the patient and all questions fully answered. An After Visit Summary was printed and given to the patient. Medication samples and scripts were also given to patient. Patient escorted out and D/C home via private auto.  ?[name]  [name]  ?07/28/2021 1:58 PM ?  ?   ?

## 2021-07-28 NOTE — ED Notes (Signed)
Pt asleep in bed. Respirations even and unlabored. Will continue to monitor for safety. ?

## 2021-08-14 ENCOUNTER — Telehealth (HOSPITAL_COMMUNITY): Payer: Self-pay

## 2021-08-14 NOTE — BH Assessment (Signed)
Care Management - [place] Follow Up Discharges  ? ?Writer attempted to make contact with patient today and was unsuccessful.  Phone number listed in epic is not a working phone number.   ? ?Per chart review, patient was provided with outpatient substance abuse resources.   ?

## 2021-12-01 ENCOUNTER — Other Ambulatory Visit (HOSPITAL_COMMUNITY)
Admission: EM | Admit: 2021-12-01 | Discharge: 2021-12-02 | Disposition: A | Discharge status: Home / Self Care | Payer: No Payment, Other | Attending: Psychiatry | Admitting: Psychiatry

## 2021-12-01 DIAGNOSIS — F10129 Alcohol abuse with intoxication, unspecified: Secondary | ICD-10-CM | POA: Insufficient documentation

## 2021-12-01 DIAGNOSIS — Z20822 Contact with and (suspected) exposure to covid-19: Secondary | ICD-10-CM | POA: Insufficient documentation

## 2021-12-01 DIAGNOSIS — F32A Depression, unspecified: Secondary | ICD-10-CM | POA: Diagnosis not present

## 2021-12-01 DIAGNOSIS — F1014 Alcohol abuse with alcohol-induced mood disorder: Secondary | ICD-10-CM | POA: Insufficient documentation

## 2021-12-01 DIAGNOSIS — F1020 Alcohol dependence, uncomplicated: Secondary | ICD-10-CM | POA: Diagnosis present

## 2021-12-01 DIAGNOSIS — F1094 Alcohol use, unspecified with alcohol-induced mood disorder: Secondary | ICD-10-CM

## 2021-12-01 LAB — CBC WITH DIFFERENTIAL/PLATELET
Abs Immature Granulocytes: 0.01 10*3/uL (ref 0.00–0.07)
Basophils Absolute: 0 10*3/uL (ref 0.0–0.1)
Basophils Relative: 1 %
Eosinophils Absolute: 0.1 10*3/uL (ref 0.0–0.5)
Eosinophils Relative: 2 %
HCT: 43.5 % (ref 39.0–52.0)
Hemoglobin: 14.8 g/dL (ref 13.0–17.0)
Immature Granulocytes: 0 %
Lymphocytes Relative: 46 %
Lymphs Abs: 2.4 10*3/uL (ref 0.7–4.0)
MCH: 30.5 pg (ref 26.0–34.0)
MCHC: 34 g/dL (ref 30.0–36.0)
MCV: 89.5 fL (ref 80.0–100.0)
Monocytes Absolute: 0.4 10*3/uL (ref 0.1–1.0)
Monocytes Relative: 8 %
Neutro Abs: 2.3 10*3/uL (ref 1.7–7.7)
Neutrophils Relative %: 43 %
Platelets: 243 10*3/uL (ref 150–400)
RBC: 4.86 MIL/uL (ref 4.22–5.81)
RDW: 14.1 % (ref 11.5–15.5)
WBC: 5.2 10*3/uL (ref 4.0–10.5)
nRBC: 0 % (ref 0.0–0.2)

## 2021-12-01 LAB — POCT URINE DRUG SCREEN - MANUAL ENTRY (I-SCREEN)
POC Amphetamine UR: NOT DETECTED
POC Buprenorphine (BUP): NOT DETECTED
POC Cocaine UR: NOT DETECTED
POC Marijuana UR: POSITIVE — AB
POC Methadone UR: NOT DETECTED
POC Methamphetamine UR: NOT DETECTED
POC Morphine: NOT DETECTED
POC Oxazepam (BZO): NOT DETECTED
POC Oxycodone UR: NOT DETECTED
POC Secobarbital (BAR): NOT DETECTED

## 2021-12-01 LAB — POC SARS CORONAVIRUS 2 AG: SARSCOV2ONAVIRUS 2 AG: NEGATIVE

## 2021-12-01 LAB — HEMOGLOBIN A1C
Hgb A1c MFr Bld: 5.3 % (ref 4.8–5.6)
Mean Plasma Glucose: 105.41 mg/dL

## 2021-12-01 MED ORDER — ALUM & MAG HYDROXIDE-SIMETH 200-200-20 MG/5ML PO SUSP
30.0000 mL | ORAL | Status: DC | PRN
  Administered 2021-12-02: 30 mL via ORAL
  Filled 2021-12-01: qty 30

## 2021-12-01 MED ORDER — HYDROXYZINE HCL 25 MG PO TABS
25.0000 mg | ORAL_TABLET | Freq: Four times a day (QID) | ORAL | Status: DC | PRN
  Administered 2021-12-02: 25 mg via ORAL
  Filled 2021-12-01: qty 1

## 2021-12-01 MED ORDER — ONDANSETRON 4 MG PO TBDP
4.0000 mg | ORAL_TABLET | Freq: Four times a day (QID) | ORAL | Status: DC | PRN
  Administered 2021-12-01 – 2021-12-02 (×2): 4 mg via ORAL
  Filled 2021-12-01 (×2): qty 1

## 2021-12-01 MED ORDER — TRAZODONE HCL 50 MG PO TABS: 50.0000 mg | ORAL_TABLET | Freq: Every evening | ORAL | Status: DC | PRN

## 2021-12-01 MED ORDER — ACETAMINOPHEN 325 MG PO TABS: 650.0000 mg | ORAL_TABLET | Freq: Four times a day (QID) | ORAL | Status: DC | PRN

## 2021-12-01 MED ORDER — LAMOTRIGINE 25 MG PO TABS
25.0000 mg | ORAL_TABLET | Freq: Every day | ORAL | Status: DC
  Administered 2021-12-02: 25 mg via ORAL
  Filled 2021-12-01: qty 1

## 2021-12-01 MED ORDER — MAGNESIUM HYDROXIDE 400 MG/5ML PO SUSP: 30.0000 mL | Freq: Every day | ORAL | Status: DC | PRN

## 2021-12-01 MED ORDER — LORAZEPAM 1 MG PO TABS
1.0000 mg | ORAL_TABLET | Freq: Four times a day (QID) | ORAL | Status: DC | PRN
  Administered 2021-12-01: 1 mg via ORAL
  Filled 2021-12-01: qty 1

## 2021-12-01 MED ORDER — THIAMINE HCL 100 MG PO TABS
100.0000 mg | ORAL_TABLET | Freq: Every day | ORAL | Status: DC
  Administered 2021-12-02: 100 mg via ORAL
  Filled 2021-12-01: qty 1

## 2021-12-01 MED ORDER — HYDROXYZINE HCL 25 MG PO TABS: 25.0000 mg | ORAL_TABLET | Freq: Three times a day (TID) | ORAL | Status: DC | PRN

## 2021-12-01 MED ORDER — LOPERAMIDE HCL 2 MG PO CAPS: 2.0000 mg | ORAL_CAPSULE | ORAL | Status: DC | PRN

## 2021-12-01 MED ORDER — GABAPENTIN 100 MG PO CAPS
100.0000 mg | ORAL_CAPSULE | Freq: Three times a day (TID) | ORAL | Status: DC
  Administered 2021-12-01 – 2021-12-02 (×3): 100 mg via ORAL
  Filled 2021-12-01 (×3): qty 1

## 2021-12-01 MED ORDER — ADULT MULTIVITAMIN W/MINERALS CH
1.0000 | ORAL_TABLET | Freq: Every day | ORAL | Status: DC
  Administered 2021-12-02: 1 via ORAL
  Filled 2021-12-01: qty 1

## 2021-12-01 MED ORDER — THIAMINE HCL 100 MG/ML IJ SOLN
100.0000 mg | Freq: Once | INTRAMUSCULAR | Status: AC
  Administered 2021-12-01: 100 mg via INTRAMUSCULAR
  Filled 2021-12-01: qty 2

## 2021-12-01 NOTE — ED Provider Notes (Signed)
Facility Based Crisis Admission H&P  Date: 12/02/21 Patient Name: Brendan Ochoa MRN: [id number] Chief Complaint:  Chief Complaint  Patient presents with   Alcohol Intoxication   Alcohol Problem      Diagnoses:  Final diagnoses:  Alcohol abuse with intoxication ([place])  Alcohol-induced mood disorder ([place])    HPI: Brendan Ochoa is a 46 year old male with psychiatric history of alcohol use disorder, depression, and cocaine abuse.  Patient presented voluntarily to [facility] requesting alcohol detox and substance abuse treatment.  This nurse petitioner met with patient face-to-face and reviewed his chart.  On assessment, he appears intoxicated and smelled of alcohol.  He is oriented and cooperative.  Patient's speech is  coherent.  He is able to maintain good eye contact.  His mood is depressed with congruent affect.  There is no evidence that patient is responding to any internal/external stimuli or experiencing any delusional thought content during this assessment.  Patient reports that he relapsed on alcohol approximately 2 months ago.  He says he completed substance abuse treatment program in April, 2023 and was able to remain sober for approximately 1 month.  He says he relapsed due to stress and conflict with his family members. He says he drinks 12-24 cans of beers daily. He denies history of alcohol withdrawal seizures or DTs. Patient reports that he his motivated to get sober and wants to detox from alcohol with plan to complete rehab through [facility]. He say he had already contacted [facility] and has made a $120 deposit towards his rehab. He say he has a room at [facility] once he completes detox. He also reports using a total of 1 gram of cocaine weekly. He denies all other substance abuse.  Patient says he is depressed due to ongoing substance abuse. He endorses depressive symptoms of hopelessness, worthlessness, guilt, isolation, racing thoughts, poor sleep, anxiety, poor  focus, and irritability. He denies suicidal ideation, homicidal ideation, hallucination, and paranoia.  PHQ 2-9:  [place] ED from 12/01/2021 in [facility] ED from 07/25/2021 in [facility] ED from 04/26/2021 in [facility]  Thoughts that you would be better off dead, or of hurting yourself in some way Not at all Several days Several days  PHQ-9 Total Score 19 9 19        Flowsheet Row ED from 12/01/2021 in [facility] ED from 07/25/2021 in [facility] ED from 06/08/2021 in [place] No Risk High Risk No Risk        Total Time spent with patient: 20 minutes  Musculoskeletal  Strength & Muscle Tone: within normal limits Gait & Station: normal Patient leans: Right  Psychiatric Specialty Exam  Presentation General Appearance: Appropriate for Environment  Eye Contact:Good  Speech:Clear and Coherent  Speech Volume:Normal  Handedness:Right   Mood and Affect  Mood:Depressed  Affect:Congruent   Thought Process  Thought Processes:Coherent  Descriptions of Associations:Intact  Orientation:Full (Time, Place and Person)  Thought Content:WDL  Diagnosis of Schizophrenia or Schizoaffective disorder in past: No   Hallucinations:Hallucinations: None  Ideas of Reference:None  Suicidal Thoughts:Suicidal Thoughts: No  Homicidal Thoughts:Homicidal Thoughts: No   Sensorium  Memory:Immediate Good; Recent Good; Remote Good  Judgment:Fair  Insight:Good   Executive Functions  Concentration:Good  Attention Span:Good  [place] of Knowledge:Good  Language:Good   Psychomotor Activity  Psychomotor Activity:Psychomotor Activity: Normal   Assets  Assets:Communication  Skills; Desire for Improvement; Physical Health   Sleep  Sleep:Sleep: Poor Number of Hours  of Sleep: 3.5   Nutritional Assessment (For OBS and FBC admissions only) Has the patient had a weight loss or gain of 10 pounds or more in the last 3 months?: No Has the patient had a decrease in food intake/or appetite?: Yes Does the patient have dental problems?: No Does the patient have eating habits or behaviors that may be indicators of an eating disorder including binging or inducing vomiting?: No Has the patient recently lost weight without trying?: 0 Has the patient been eating poorly because of a decreased appetite?: 0 Malnutrition Screening Tool Score: 0    Physical Exam Vitals and nursing note reviewed.  Constitutional:      General: He is not in acute distress.    Appearance: He is well-developed. He is not ill-appearing or diaphoretic.  HENT:     Head: Normocephalic and atraumatic.  Eyes:     Conjunctiva/sclera: Conjunctivae normal.  Cardiovascular:     Rate and Rhythm: Normal rate.  Pulmonary:     Effort: Pulmonary effort is normal.  Abdominal:     Palpations: Abdomen is soft.     Tenderness: There is no abdominal tenderness.  Musculoskeletal:        General: No swelling.     Cervical back: Neck supple.  Skin:    General: Skin is warm and dry.     Capillary Refill: Capillary refill takes less than 2 seconds.  Neurological:     Mental Status: He is alert and oriented to person, place, and time.  Psychiatric:        Attention and Perception: Attention and perception normal.        Mood and Affect: Affect normal. Mood is anxious and depressed.        Speech: Speech normal.        Behavior: Behavior normal. Behavior is cooperative.        Thought Content: Thought content normal.        Cognition and Memory: Cognition normal.    Review of Systems  Constitutional: Negative.  Negative for chills, diaphoresis and fever.  HENT: Negative.    Eyes: Negative.   Respiratory: Negative.    Cardiovascular: Negative.  Negative for chest pain and palpitations.   Gastrointestinal: Negative.   Genitourinary: Negative.   Musculoskeletal: Negative.   Skin: Negative.   Neurological: Negative.   Endo/Heme/Allergies: Negative.   Psychiatric/Behavioral:  Positive for depression and substance abuse. Negative for hallucinations and suicidal ideas. The patient is nervous/anxious.     Blood pressure 109/76, pulse 96, temperature 98.3 F (36.8 C), temperature source Oral, resp. rate 18, SpO2 95 %. There is no height or weight on file to calculate BMI.  Past Psychiatric History:    Is the patient at risk to self? No  Has the patient been a risk to self in the past 6 months? No .    Has the patient been a risk to self within the distant past? No   Is the patient a risk to others? No   Has the patient been a risk to others in the past 6 months? No   Has the patient been a risk to others within the distant past? No   Past Medical History:  Past Medical History:  Diagnosis Date   Asthma    Cocaine abuse ([place])    in past   Crohn disease ([place]) 2008   Depression    in  past, not current as of 2019    ETOH abuse    history of alcohol use   GERD (gastroesophageal reflux disease)     Past Surgical History:  Procedure Laterality Date   BIOPSY  10/15/2017   Procedure: BIOPSY;  Surgeon: Danie Binder, MD;  Location: [facility] SU[facility];  Service: Endoscopy;;  terminal ileum colon   COLONOSCOPY WITH PROPOFOL N/A 10/15/2017   redundant colon, normal rectum, external and internal hemorrhoids, quiescent crohn's. Pathology: inactive chronicn nonspecific ileitis   ESOPHAGOGASTRODUODENOSCOPY (EGD) WITH PROPOFOL N/A 10/15/2017   normal esophagus, small hiatal hernia, gastritis, duodenitis   FINGER AMPUTATION Right 2012   cut finger table saw and surgically amputated    Family History:  Family History  Problem Relation Age of Onset   Stomach cancer Maternal Grandfather    Colon cancer Neg Hx     Social History:  Social History   Socioeconomic History   Marital  status: Single    Spouse name: Not on file   Number of children: Not on file   Years of education: Not on file   Highest education level: Not on file  Occupational History   Not on file  Tobacco Use   Smoking status: Every Day    Packs/day: 0.50    Years: 15.00    Total pack years: 7.50    Types: Cigarettes   Smokeless tobacco: Never   Tobacco comments:    1/4 to 1/2 pack per day   Vaping Use   Vaping Use: Never used  Substance and Sexual Activity   Alcohol use: Not Currently    Comment: alcoholic- last use 12/30/54   Drug use: Not Currently    Types: Cocaine, Marijuana, Methamphetamines    Comment: last cocaine use 10/10/20.  last meth april 2022   Sexual activity: Not on file  Other Topics Concern   Not on file  Social History Narrative   Not on file   Social Determinants of Health   Financial Resource Strain: Not on file  Food Insecurity: Not on file  Transportation Needs: Not on file  Physical Activity: Not on file  Stress: Not on file  Social Connections: Not on file  Intimate Partner Violence: Not on file    SDOH:  SDOH Screenings   Alcohol Screen: Not on file  Depression (PHQ2-9): Medium Risk (12/01/2021)   Depression (PHQ2-9)    PHQ-2 Score: 19  Financial Resource Strain: Not on file  Food Insecurity: Not on file  Housing: Not on file  Physical Activity: Not on file  Social Connections: Not on file  Stress: Not on file  Tobacco Use: High Risk (04/27/2021)   Patient History    Smoking Tobacco Use: Every Day    Smokeless Tobacco Use: Never    Passive Exposure: Not on file  Transportation Needs: Not on file    Last Labs:  Admission on 12/01/2021  Component Date Value Ref Range Status   SARS Coronavirus 2 by RT PCR 12/01/2021 NEGATIVE  NEGATIVE Final   Comment: (NOTE) SARS-CoV-2 target nucleic acids are NOT DETECTED.  The SARS-CoV-2 RNA is generally detectable in upper respiratory specimens during the acute phase of infection. The  lowest concentration of SARS-CoV-2 viral copies this assay can detect is 138 copies/mL. A negative result does not preclude SARS-Cov-2 infection and should not be used as the sole basis for treatment or other patient management decisions. A negative result may occur with  improper specimen collection/handling, submission of specimen other than nasopharyngeal swab,  presence of viral mutation(s) within the areas targeted by this assay, and inadequate number of viral copies(<138 copies/mL). A negative result must be combined with clinical observations, patient history, and epidemiological information. The expected result is Negative.  Fact Sheet for Patients:  EntrepreneurPulse.com.au  Fact Sheet for Healthcare Providers:  IncredibleEmployment.be  This test is no                          t yet approved or cleared by the [place] FDA and  has been authorized for detection and/or diagnosis of SARS-CoV-2 by FDA under an Emergency Use Authorization (EUA). This EUA will remain  in effect (meaning this test can be used) for the duration of the COVID-19 declaration under Section 564(b)(1) of the Act, 21 U.S.C.section 360bbb-3(b)(1), unless the authorization is terminated  or revoked sooner.       Influenza A by PCR 12/01/2021 NEGATIVE  NEGATIVE Final   Influenza B by PCR 12/01/2021 NEGATIVE  NEGATIVE Final   Comment: (NOTE) The Xpert Xpress SARS-CoV-2/FLU/RSV plus assay is intended as an aid in the diagnosis of influenza from Nasopharyngeal swab specimens and should not be used as a sole basis for treatment. Nasal washings and aspirates are unacceptable for Xpert Xpress SARS-CoV-2/FLU/RSV testing.  Fact Sheet for Patients: EntrepreneurPulse.com.au  Fact Sheet for Healthcare Providers: IncredibleEmployment.be  This test is not yet approved or cleared by the [place] FDA and has been authorized for  detection and/or diagnosis of SARS-CoV-2 by FDA under an Emergency Use Authorization (EUA). This EUA will remain in effect (meaning this test can be used) for the duration of the COVID-19 declaration under Section 564(b)(1) of the Act, 21 U.S.C. section 360bbb-3(b)(1), unless the authorization is terminated or revoked.  Performed at [facility], [place] [address] [place], [place] [address]    WBC 12/01/2021 5.2  4.0 - 10.5 K/uL Final   RBC 12/01/2021 4.86  4.22 - 5.81 MIL/uL Final   Hemoglobin 12/01/2021 14.8  13.0 - 17.0 g/dL Final   HCT 12/01/2021 43.5  39.0 - 52.0 % Final   MCV 12/01/2021 89.5  80.0 - 100.0 fL Final   MCH 12/01/2021 30.5  26.0 - 34.0 pg Final   MCHC 12/01/2021 34.0  30.0 - 36.0 g/dL Final   RDW 12/01/2021 14.1  11.5 - 15.5 % Final   Platelets 12/01/2021 243  150 - 400 K/uL Final   nRBC 12/01/2021 0.0  0.0 - 0.2 % Final   Neutrophils Relative % 12/01/2021 43  % Final   Neutro Abs 12/01/2021 2.3  1.7 - 7.7 K/uL Final   Lymphocytes Relative 12/01/2021 46  % Final   Lymphs Abs 12/01/2021 2.4  0.7 - 4.0 K/uL Final   Monocytes Relative 12/01/2021 8  % Final   Monocytes Absolute 12/01/2021 0.4  0.1 - 1.0 K/uL Final   Eosinophils Relative 12/01/2021 2  % Final   Eosinophils Absolute 12/01/2021 0.1  0.0 - 0.5 K/uL Final   Basophils Relative 12/01/2021 1  % Final   Basophils Absolute 12/01/2021 0.0  0.0 - 0.1 K/uL Final   Immature Granulocytes 12/01/2021 0  % Final   Abs Immature Granulocytes 12/01/2021 0.01  0.00 - 0.07 K/uL Final   Performed at [facility], [place] [address], [place], [place] [address]   Sodium 12/01/2021 140  135 - 145 mmol/L Final   Potassium 12/01/2021 3.8  3.5 - 5.1 mmol/L Final   Chloride 12/01/2021 106  98 -  111 mmol/L Final   CO2 12/01/2021 23  22 - 32 mmol/L Final   Glucose, Bld 12/01/2021 94  70 - 99 mg/dL Final   Glucose reference range applies only to samples taken after fasting for at least 8 hours.   BUN 12/01/2021 16  6 - 20  mg/dL Final   Creatinine, Ser 12/01/2021 0.87  0.61 - 1.24 mg/dL Final   Calcium 12/01/2021 8.8 (L)  8.9 - 10.3 mg/dL Final   Total Protein 12/01/2021 6.8  6.5 - 8.1 g/dL Final   Albumin 12/01/2021 4.0  3.5 - 5.0 g/dL Final   AST 12/01/2021 26  15 - 41 U/L Final   ALT 12/01/2021 22  0 - 44 U/L Final   Alkaline Phosphatase 12/01/2021 68  38 - 126 U/L Final   Total Bilirubin 12/01/2021 0.6  0.3 - 1.2 mg/dL Final   GFR, Estimated 12/01/2021 >60  >60 mL/min Final   Comment: (NOTE) Calculated using the CKD-EPI Creatinine Equation (2021)    Anion gap 12/01/2021 11  5 - 15 Final   Performed at [facility] [address] [place], [place] [address]   Hgb A1c MFr Bld 12/01/2021 5.3  4.8 - 5.6 % Final   Comment: (NOTE) Pre diabetes:          5.7%-6.4%  Diabetes:              >6.4%  Glycemic control for   <7.0% adults with diabetes    Mean Plasma Glucose 12/01/2021 105.41  mg/dL Final   Performed at [facility], [place] [address] [place], [place] [address]   Alcohol, Ethyl (B) 12/01/2021 107 (H)  <10 mg/dL Final   Comment: (NOTE) Lowest detectable limit for serum alcohol is 10 mg/dL.  For medical purposes only. Performed at [facility], [place] [address], [place], [place] [address]    TSH 12/01/2021 2.185  0.350 - 4.500 uIU/mL Final   Comment: Performed by a 3rd Generation assay with a functional sensitivity of <=0.01 uIU/mL. Performed at [facility], [place] [address], [place], [place] [address]    POC Amphetamine UR 12/01/2021 None Detected  NONE DETECTED (Cut Off Level 1000 ng/mL) Final   POC Secobarbital (BAR) 12/01/2021 None Detected  NONE DETECTED (Cut Off Level 300 ng/mL) Final   POC Buprenorphine (BUP) 12/01/2021 None Detected  NONE DETECTED (Cut Off Level 10 ng/mL) Final   POC Oxazepam (BZO) 12/01/2021 None Detected  NONE DETECTED (Cut Off Level 300 ng/mL) Final   POC Cocaine UR 12/01/2021 None Detected  NONE DETECTED (Cut Off Level 300 ng/mL) Final   POC  Methamphetamine UR 12/01/2021 None Detected  NONE DETECTED (Cut Off Level 1000 ng/mL) Final   POC Morphine 12/01/2021 None Detected  NONE DETECTED (Cut Off Level 300 ng/mL) Final   POC Methadone UR 12/01/2021 None Detected  NONE DETECTED (Cut Off Level 300 ng/mL) Final   POC Oxycodone UR 12/01/2021 None Detected  NONE DETECTED (Cut Off Level 100 ng/mL) Final   POC Marijuana UR 12/01/2021 Positive (A)  NONE DETECTED (Cut Off Level 50 ng/mL) Final   SARSCOV2ONAVIRUS 2 AG 12/01/2021 NEGATIVE  NEGATIVE Final   Comment: (NOTE) SARS-CoV-2 antigen NOT DETECTED.   Negative results are presumptive.  Negative results do not preclude SARS-CoV-2 infection and should not be used as the sole basis for treatment or other patient management decisions, including infection  control decisions, particularly in the presence of clinical signs and  symptoms consistent with COVID-19, or  in those who have been in contact with the virus.  Negative results must be combined with clinical observations, patient history, and epidemiological information. The expected result is Negative.  Fact Sheet for Patients: HandmadeRecipes.com.cy  Fact Sheet for Healthcare Providers: FuneralLife.at  This test is not yet approved or cleared by the [place] FDA and  has been authorized for detection and/or diagnosis of SARS-CoV-2 by FDA under an Emergency Use Authorization (EUA).  This EUA will remain in effect (meaning this test can be used) for the duration of  the COV                          ID-19 declaration under Section 564(b)(1) of the Act, 21 U.S.C. section 360bbb-3(b)(1), unless the authorization is terminated or revoked sooner.     Cholesterol 12/01/2021 194  0 - 200 mg/dL Final   Triglycerides 12/01/2021 270 (H)  <150 mg/dL Final   HDL 12/01/2021 75  >40 mg/dL Final   Total CHOL/HDL Ratio 12/01/2021 2.6  RATIO Final   VLDL 12/01/2021 54 (H)  0 - 40 mg/dL Final    LDL Cholesterol 12/01/2021 65  0 - 99 mg/dL Final   Comment:        Total Cholesterol/HDL:CHD Risk Coronary Heart Disease Risk Table                     Men   Women  1/2 Average Risk   3.4   3.3  Average Risk       5.0   4.4  2 X Average Risk   9.6   7.1  3 X Average Risk  23.4   11.0        Use the calculated Patient Ratio above and the CHD Risk Table to determine the patient's CHD Risk.        ATP III CLASSIFICATION (LDL):  <100     mg/dL   Optimal  100-129  mg/dL   Near or Above                    Optimal  130-159  mg/dL   Borderline  160-189  mg/dL   High  >190     mg/dL   Very High Performed at [facility] [address], [place], [place] [address]   Admission on 07/25/2021, Discharged on 07/28/2021  Component Date Value Ref Range Status   SARS Coronavirus 2 by RT PCR 07/25/2021 NEGATIVE  NEGATIVE Final   Comment: (NOTE) SARS-CoV-2 target nucleic acids are NOT DETECTED.  The SARS-CoV-2 RNA is generally detectable in upper respiratory specimens during the acute phase of infection. The lowest concentration of SARS-CoV-2 viral copies this assay can detect is 138 copies/mL. A negative result does not preclude SARS-Cov-2 infection and should not be used as the sole basis for treatment or other patient management decisions. A negative result may occur with  improper specimen collection/handling, submission of specimen other than nasopharyngeal swab, presence of viral mutation(s) within the areas targeted by this assay, and inadequate number of viral copies(<138 copies/mL). A negative result must be combined with clinical observations, patient history, and epidemiological information. The expected result is Negative.  Fact Sheet for Patients:  EntrepreneurPulse.com.au  Fact Sheet for Healthcare Providers:  IncredibleEmployment.be  This test is no                          t yet  approved or cleared by the [place] and   has been authorized for detection and/or diagnosis of SARS-CoV-2 by FDA under an Emergency Use Authorization (EUA). This EUA will remain  in effect (meaning this test can be used) for the duration of the COVID-19 declaration under Section 564(b)(1) of the Act, 21 U.S.C.section 360bbb-3(b)(1), unless the authorization is terminated  or revoked sooner.       Influenza A by PCR 07/25/2021 NEGATIVE  NEGATIVE Final   Influenza B by PCR 07/25/2021 NEGATIVE  NEGATIVE Final   Comment: (NOTE) The Xpert Xpress SARS-CoV-2/FLU/RSV plus assay is intended as an aid in the diagnosis of influenza from Nasopharyngeal swab specimens and should not be used as a sole basis for treatment. Nasal washings and aspirates are unacceptable for Xpert Xpress SARS-CoV-2/FLU/RSV testing.  Fact Sheet for Patients: EntrepreneurPulse.com.au  Fact Sheet for Healthcare Providers: IncredibleEmployment.be  This test is not yet approved or cleared by the [place] FDA and has been authorized for detection and/or diagnosis of SARS-CoV-2 by FDA under an Emergency Use Authorization (EUA). This EUA will remain in effect (meaning this test can be used) for the duration of the COVID-19 declaration under Section 564(b)(1) of the Act, 21 U.S.C. section 360bbb-3(b)(1), unless the authorization is terminated or revoked.  Performed at [facility], [place] [address] [place], [place] [address]    SARS Coronavirus 2 Ag 07/25/2021 Negative  Negative Final   WBC 07/25/2021 15.9 (H)  4.0 - 10.5 K/uL Final   RBC 07/25/2021 5.15  4.22 - 5.81 MIL/uL Final   Hemoglobin 07/25/2021 15.5  13.0 - 17.0 g/dL Final   HCT 07/25/2021 46.5  39.0 - 52.0 % Final   MCV 07/25/2021 90.3  80.0 - 100.0 fL Final   MCH 07/25/2021 30.1  26.0 - 34.0 pg Final   MCHC 07/25/2021 33.3  30.0 - 36.0 g/dL Final   RDW 07/25/2021 13.2  11.5 - 15.5 % Final   Platelets 07/25/2021 287  150 - 400 K/uL Final   nRBC  07/25/2021 0.0  0.0 - 0.2 % Final   Neutrophils Relative % 07/25/2021 82  % Final   Neutro Abs 07/25/2021 13.0 (H)  1.7 - 7.7 K/uL Final   Lymphocytes Relative 07/25/2021 9  % Final   Lymphs Abs 07/25/2021 1.5  0.7 - 4.0 K/uL Final   Monocytes Relative 07/25/2021 8  % Final   Monocytes Absolute 07/25/2021 1.3 (H)  0.1 - 1.0 K/uL Final   Eosinophils Relative 07/25/2021 0  % Final   Eosinophils Absolute 07/25/2021 0.0  0.0 - 0.5 K/uL Final   Basophils Relative 07/25/2021 0  % Final   Basophils Absolute 07/25/2021 0.0  0.0 - 0.1 K/uL Final   Immature Granulocytes 07/25/2021 1  % Final   Abs Immature Granulocytes 07/25/2021 0.08 (H)  0.00 - 0.07 K/uL Final   Performed at [facility], [place] [address], [place], [place] [address]   Sodium 07/25/2021 135  135 - 145 mmol/L Final   Potassium 07/25/2021 3.7  3.5 - 5.1 mmol/L Final   Chloride 07/25/2021 99  98 - 111 mmol/L Final   CO2 07/25/2021 20 (L)  22 - 32 mmol/L Final   Glucose, Bld 07/25/2021 108 (H)  70 - 99 mg/dL Final   Glucose reference range applies only to samples taken after fasting for at least 8 hours.   BUN 07/25/2021 10  6 - 20 mg/dL Final   Creatinine, Ser 07/25/2021 0.74  0.61 - 1.24 mg/dL  Final   Calcium 07/25/2021 9.5  8.9 - 10.3 mg/dL Final   Total Protein 07/25/2021 7.5  6.5 - 8.1 g/dL Final   Albumin 07/25/2021 4.5  3.5 - 5.0 g/dL Final   AST 07/25/2021 32  15 - 41 U/L Final   ALT 07/25/2021 42  0 - 44 U/L Final   Alkaline Phosphatase 07/25/2021 67  38 - 126 U/L Final   Total Bilirubin 07/25/2021 0.7  0.3 - 1.2 mg/dL Final   GFR, Estimated 07/25/2021 >60  >60 mL/min Final   Comment: (NOTE) Calculated using the CKD-EPI Creatinine Equation (2021)    Anion gap 07/25/2021 16 (H)  5 - 15 Final   Performed at [facility], [place] [address] [place], [place] [address]   Hgb A1c MFr Bld 07/25/2021 5.5  4.8 - 5.6 % Final   Comment: (NOTE)         Prediabetes: 5.7 - 6.4         Diabetes: >6.4         Glycemic  control for adults with diabetes: <7.0    Mean Plasma Glucose 07/25/2021 111  mg/dL Final   Comment: (NOTE) Performed At: [facility] [place], [place] [id number] Rush Farmer MD [id number]    Magnesium 07/25/2021 2.1  1.7 - 2.4 mg/dL Final   Performed at [facility], [place] [address], [place], [place] [address]   Alcohol, Ethyl (B) 07/25/2021 124 (H)  <10 mg/dL Final   Comment: (NOTE) Lowest detectable limit for serum alcohol is 10 mg/dL.  For medical purposes only. Performed at [facility], [place] [address], [place], [place] [address]    Color, Urine 07/25/2021 YELLOW  YELLOW Final   APPearance 07/25/2021 CLEAR  CLEAR Final   Specific Gravity, Urine 07/25/2021 1.006  1.005 - 1.030 Final   pH 07/25/2021 6.0  5.0 - 8.0 Final   Glucose, UA 07/25/2021 NEGATIVE  NEGATIVE mg/dL Final   Hgb urine dipstick 07/25/2021 SMALL (A)  NEGATIVE Final   Bilirubin Urine 07/25/2021 NEGATIVE  NEGATIVE Final   Ketones, ur 07/25/2021 NEGATIVE  NEGATIVE mg/dL Final   Protein, ur 07/25/2021 NEGATIVE  NEGATIVE mg/dL Final   Nitrite 07/25/2021 NEGATIVE  NEGATIVE Final   Leukocytes,Ua 07/25/2021 NEGATIVE  NEGATIVE Final   WBC, UA 07/25/2021 0-5  0 - 5 WBC/hpf Final   Bacteria, UA 07/25/2021 NONE SEEN  NONE SEEN Final   Performed at [facility], [address] [address] [place], [place] [address]   POC Amphetamine UR 07/25/2021 None Detected  NONE DETECTED (Cut Off Level 1000 ng/mL) Final   POC Secobarbital (BAR) 07/25/2021 None Detected  NONE DETECTED (Cut Off Level 300 ng/mL) Final   POC Buprenorphine (BUP) 07/25/2021 None Detected  NONE DETECTED (Cut Off Level 10 ng/mL) Final   POC Oxazepam (BZO) 07/25/2021 Positive (A)  NONE DETECTED (Cut Off Level 300 ng/mL) Final   POC Cocaine UR 07/25/2021 Positive (A)  NONE DETECTED (Cut Off Level 300 ng/mL) Final   POC Methamphetamine UR 07/25/2021 None Detected  NONE DETECTED (Cut Off Level 1000 ng/mL) Final   POC Morphine  07/25/2021 None Detected  NONE DETECTED (Cut Off Level 300 ng/mL) Final   POC Oxycodone UR 07/25/2021 None Detected  NONE DETECTED (Cut Off Level 100 ng/mL) Final   POC Methadone UR 07/25/2021 None Detected  NONE DETECTED (Cut Off Level 300 ng/mL) Final   POC Marijuana UR 07/25/2021 Positive (A)  NONE DETECTED (Cut Off Level 50 ng/mL) Final  Admission on 06/08/2021, Discharged on 06/10/2021  Component Date Value Ref Range Status   SARS Coronavirus 2 by RT PCR 06/08/2021 NEGATIVE  NEGATIVE Final   Comment: (NOTE) SARS-CoV-2 target nucleic acids are NOT DETECTED.  The SARS-CoV-2 RNA is generally detectable in upper respiratory specimens during the acute phase of infection. The lowest concentration of SARS-CoV-2 viral copies this assay can detect is 138 copies/mL. A negative result does not preclude SARS-Cov-2 infection and should not be used as the sole basis for treatment or other patient management decisions. A negative result may occur with  improper specimen collection/handling, submission of specimen other than nasopharyngeal swab, presence of viral mutation(s) within the areas targeted by this assay, and inadequate number of viral copies(<138 copies/mL). A negative result must be combined with clinical observations, patient history, and epidemiological information. The expected result is Negative.  Fact Sheet for Patients:  EntrepreneurPulse.com.au  Fact Sheet for Healthcare Providers:  IncredibleEmployment.be  This test is no                          t yet approved or cleared by the [place] FDA and  has been authorized for detection and/or diagnosis of SARS-CoV-2 by FDA under an Emergency Use Authorization (EUA). This EUA will remain  in effect (meaning this test can be used) for the duration of the COVID-19 declaration under Section 564(b)(1) of the Act, 21 U.S.C.section 360bbb-3(b)(1), unless the authorization is terminated  or  revoked sooner.       Influenza A by PCR 06/08/2021 NEGATIVE  NEGATIVE Final   Influenza B by PCR 06/08/2021 NEGATIVE  NEGATIVE Final   Comment: (NOTE) The Xpert Xpress SARS-CoV-2/FLU/RSV plus assay is intended as an aid in the diagnosis of influenza from Nasopharyngeal swab specimens and should not be used as a sole basis for treatment. Nasal washings and aspirates are unacceptable for Xpert Xpress SARS-CoV-2/FLU/RSV testing.  Fact Sheet for Patients: EntrepreneurPulse.com.au  Fact Sheet for Healthcare Providers: IncredibleEmployment.be  This test is not yet approved or cleared by the [place] FDA and has been authorized for detection and/or diagnosis of SARS-CoV-2 by FDA under an Emergency Use Authorization (EUA). This EUA will remain in effect (meaning this test can be used) for the duration of the COVID-19 declaration under Section 564(b)(1) of the Act, 21 U.S.C. section 360bbb-3(b)(1), unless the authorization is terminated or revoked.  Performed at [facility], [place] [address] [place], [place] [address]    WBC 06/08/2021 7.4  4.0 - 10.5 K/uL Final   RBC 06/08/2021 4.69  4.22 - 5.81 MIL/uL Final   Hemoglobin 06/08/2021 14.3  13.0 - 17.0 g/dL Final   HCT 06/08/2021 42.2  39.0 - 52.0 % Final   MCV 06/08/2021 90.0  80.0 - 100.0 fL Final   MCH 06/08/2021 30.5  26.0 - 34.0 pg Final   MCHC 06/08/2021 33.9  30.0 - 36.0 g/dL Final   RDW 06/08/2021 13.6  11.5 - 15.5 % Final   Platelets 06/08/2021 266  150 - 400 K/uL Final   nRBC 06/08/2021 0.0  0.0 - 0.2 % Final   Neutrophils Relative % 06/08/2021 62  % Final   Neutro Abs 06/08/2021 4.5  1.7 - 7.7 K/uL Final   Lymphocytes Relative 06/08/2021 30  % Final   Lymphs Abs 06/08/2021 2.2  0.7 - 4.0 K/uL Final   Monocytes Relative 06/08/2021 7  % Final   Monocytes Absolute 06/08/2021 0.5  0.1 - 1.0 K/uL  Final   Eosinophils Relative 06/08/2021 1  % Final   Eosinophils Absolute  06/08/2021 0.1  0.0 - 0.5 K/uL Final   Basophils Relative 06/08/2021 0  % Final   Basophils Absolute 06/08/2021 0.0  0.0 - 0.1 K/uL Final   Immature Granulocytes 06/08/2021 0  % Final   Abs Immature Granulocytes 06/08/2021 0.02  0.00 - 0.07 K/uL Final   Performed at [facility], [place] [address] [place], [place] [address]   Sodium 06/08/2021 138  135 - 145 mmol/L Final   Potassium 06/08/2021 3.6  3.5 - 5.1 mmol/L Final   Chloride 06/08/2021 101  98 - 111 mmol/L Final   CO2 06/08/2021 21 (L)  22 - 32 mmol/L Final   Glucose, Bld 06/08/2021 99  70 - 99 mg/dL Final   Glucose reference range applies only to samples taken after fasting for at least 8 hours.   BUN 06/08/2021 13  6 - 20 mg/dL Final   Creatinine, Ser 06/08/2021 0.81  0.61 - 1.24 mg/dL Final   Calcium 06/08/2021 9.0  8.9 - 10.3 mg/dL Final   Total Protein 06/08/2021 7.0  6.5 - 8.1 g/dL Final   Albumin 06/08/2021 4.2  3.5 - 5.0 g/dL Final   AST 06/08/2021 31  15 - 41 U/L Final   ALT 06/08/2021 23  0 - 44 U/L Final   Alkaline Phosphatase 06/08/2021 63  38 - 126 U/L Final   Total Bilirubin 06/08/2021 0.8  0.3 - 1.2 mg/dL Final   GFR, Estimated 06/08/2021 >60  >60 mL/min Final   Comment: (NOTE) Calculated using the CKD-EPI Creatinine Equation (2021)    Anion gap 06/08/2021 16 (H)  5 - 15 Final   Performed at [facility], [place] [address] [place], [place] [address]   Magnesium 06/08/2021 2.3  1.7 - 2.4 mg/dL Final   Performed at [facility] [address] [place], [place] [address]   Alcohol, Ethyl (B) 06/08/2021 166 (H)  <10 mg/dL Final   Comment: (NOTE) Lowest detectable limit for serum alcohol is 10 mg/dL.  For medical purposes only. Performed at [facility], [place] [address], [place], [place] [address]    Cholesterol 06/08/2021 227 (H)  0 - 200 mg/dL Final   Triglycerides 06/08/2021 135  <150 mg/dL Final   HDL 06/08/2021 96  >40 mg/dL Final   Total CHOL/HDL Ratio 06/08/2021 2.4  RATIO Final    VLDL 06/08/2021 27  0 - 40 mg/dL Final   LDL Cholesterol 06/08/2021 104 (H)  0 - 99 mg/dL Final   Comment:        Total Cholesterol/HDL:CHD Risk Coronary Heart Disease Risk Table                     Men   Women  1/2 Average Risk   3.4   3.3  Average Risk       5.0   4.4  2 X Average Risk   9.6   7.1  3 X Average Risk  23.4   11.0        Use the calculated Patient Ratio above and the CHD Risk Table to determine the patient's CHD Risk.        ATP III CLASSIFICATION (LDL):  <100     mg/dL   Optimal  100-129  mg/dL   Near or Above  Optimal  130-159  mg/dL   Borderline  160-189  mg/dL   High  >190     mg/dL   Very High Performed at [facility] [address] [place], [place] [address]    TSH 06/08/2021 2.022  0.350 - 4.500 uIU/mL Final   Comment: Performed by a 3rd Generation assay with a functional sensitivity of <=0.01 uIU/mL. Performed at [facility], [place] [address], [place], [place] [address]    Prolactin 06/08/2021 9.7  4.0 - 15.2 ng/mL Final   Comment: (NOTE) Performed At: [facility] [place], [place] [id number] Rush Farmer MD TD:[id number]    POC Amphetamine UR 06/08/2021 None Detected  NONE DETECTED (Cut Off Level 1000 ng/mL) Final   POC Secobarbital (BAR) 06/08/2021 None Detected  NONE DETECTED (Cut Off Level 300 ng/mL) Final   POC Buprenorphine (BUP) 06/08/2021 None Detected  NONE DETECTED (Cut Off Level 10 ng/mL) Final   POC Oxazepam (BZO) 06/08/2021 None Detected  NONE DETECTED (Cut Off Level 300 ng/mL) Final   POC Cocaine UR 06/08/2021 Positive (A)  NONE DETECTED (Cut Off Level 300 ng/mL) Final   POC Methamphetamine UR 06/08/2021 None Detected  NONE DETECTED (Cut Off Level 1000 ng/mL) Final   POC Morphine 06/08/2021 None Detected  NONE DETECTED (Cut Off Level 300 ng/mL) Final   POC Oxycodone UR 06/08/2021 None Detected  NONE DETECTED (Cut Off Level 100 ng/mL) Final   POC Methadone UR 06/08/2021 None  Detected  NONE DETECTED (Cut Off Level 300 ng/mL) Final   POC Marijuana UR 06/08/2021 Positive (A)  NONE DETECTED (Cut Off Level 50 ng/mL) Final    Allergies: Patient has no known allergies.  PTA Medications: (Not in a hospital admission)   Long Term Goals: Improvement in symptoms so as ready for discharge  Short Term Goals: Patient will verbalize feelings in meetings with treatment team members., Patient will attend at least of 50% of the groups daily., Pt will complete the PHQ9 on admission, day 3 and discharge., Patient will participate in completing the [place], Patient will score a low risk of violence for 24 hours prior to discharge, and Patient will take medications as prescribed daily.  Medical Decision Making  Patient will be admitted to [facility] for management of alcohol withdrawal symptoms and stabilization. Lab Orders         Resp Panel by RT-PCR (Flu A&B, Covid) Anterior Nasal Swab         CBC with Differential/Platelet         Comprehensive metabolic panel         Hemoglobin A1c         Ethanol         TSH         Lipid panel         POCT Urine Drug Screen - (I-Screen)         POC SARS Coronavirus 2 Ag    -CIWA protocol initiated -resume home medications -Monitor patient for safety    Recommendations  Based on my evaluation the patient does not appear to have an emergency medical condition.  Ophelia Shoulder, NP 12/02/21  2:51 AM

## 2021-12-01 NOTE — ED Triage Notes (Signed)
Pt presents to [facility] under the influence of alcohol. Pt states he is intoxicated and consumed a 12 pack of beers prior to coming to this facility, Pt states he is seeking alcohol use treatment. Pt states he drinks a 12 pack of beer daily. Pt reports going to treatment in march.Pt denies SI.HI and AVH.

## 2021-12-02 DIAGNOSIS — Z20822 Contact with and (suspected) exposure to covid-19: Secondary | ICD-10-CM | POA: Diagnosis not present

## 2021-12-02 DIAGNOSIS — F10129 Alcohol abuse with intoxication, unspecified: Secondary | ICD-10-CM | POA: Diagnosis not present

## 2021-12-02 DIAGNOSIS — F32A Depression, unspecified: Secondary | ICD-10-CM | POA: Diagnosis not present

## 2021-12-02 DIAGNOSIS — F1014 Alcohol abuse with alcohol-induced mood disorder: Secondary | ICD-10-CM | POA: Diagnosis not present

## 2021-12-02 LAB — LIPID PANEL
Cholesterol: 194 mg/dL (ref 0–200)
HDL: 75 mg/dL (ref >40)
LDL Cholesterol: 65 mg/dL (ref 0–99)
Total CHOL/HDL Ratio: 2.6 RATIO
Triglycerides: 270 mg/dL — ABNORMAL HIGH (ref <150)
VLDL: 54 mg/dL — ABNORMAL HIGH (ref 0–40)

## 2021-12-02 LAB — COMPREHENSIVE METABOLIC PANEL
ALT: 22 U/L (ref 0–44)
AST: 26 U/L (ref 15–41)
Albumin: 4 g/dL (ref 3.5–5.0)
Alkaline Phosphatase: 68 U/L (ref 38–126)
Anion gap: 11 (ref 5–15)
BUN: 16 mg/dL (ref 6–20)
CO2: 23 mmol/L (ref 22–32)
Calcium: 8.8 mg/dL — ABNORMAL LOW (ref 8.9–10.3)
Chloride: 106 mmol/L (ref 98–111)
Creatinine, Ser: 0.87 mg/dL (ref 0.61–1.24)
GFR, Estimated: >60 mL/min (ref >60)
Glucose, Bld: 94 mg/dL (ref 70–99)
Potassium: 3.8 mmol/L (ref 3.5–5.1)
Sodium: 140 mmol/L (ref 135–145)
Total Bilirubin: 0.6 mg/dL (ref 0.3–1.2)
Total Protein: 6.8 g/dL (ref 6.5–8.1)

## 2021-12-02 LAB — RESP PANEL BY RT-PCR (FLU A&B, COVID) ARPGX2
Influenza A by PCR: NEGATIVE
Influenza B by PCR: NEGATIVE
SARS Coronavirus 2 by RT PCR: NEGATIVE

## 2021-12-02 LAB — TSH: TSH: 2.185 u[IU]/mL (ref 0.350–4.500)

## 2021-12-02 LAB — ETHANOL: Alcohol, Ethyl (B): 107 mg/dL — ABNORMAL HIGH (ref <10)

## 2021-12-02 MED ORDER — NICOTINE 21 MG/24HR TD PT24
21.0000 mg | MEDICATED_PATCH | Freq: Every day | TRANSDERMAL | Status: DC
Start: 2021-12-02 — End: 2021-12-02
  Administered 2021-12-02: 21 mg via TRANSDERMAL

## 2021-12-02 MED ORDER — NICOTINE 21 MG/24HR TD PT24
MEDICATED_PATCH | TRANSDERMAL | Status: AC
  Filled 2021-12-02: qty 1

## 2021-12-02 NOTE — ED Notes (Signed)
Pt admitted to [facility] due to alcohol use. Pt A&O x4, calm and cooperative. Pt denies SI/HI/AVH. Pt tolerated admission process and skin assessment well. Pt ambulated independently to unit. Oriented to unit/staff. Cheez its and water provided per pt request. No signs of acute distress noted. Will continue to monitor for safety.

## 2021-12-02 NOTE — BH Assessment (Signed)
Comprehensive Clinical Assessment (CCA) Note  12/02/2021 Brendan Ochoa [id number]  Disposition: Brendan Reasoner, NP recommends pt to be admitted to [place].   [place] ED from 12/01/2021 in [facility] ED from 07/25/2021 in [facility] ED from 06/08/2021 in [place] No Risk High Risk No Risk      The patient demonstrates the following risk factors for suicide: Chronic risk factors for suicide include: psychiatric disorder of Alcohol abuse with Intoxication (Brendan Ochoa), Alcohol-Induced Mood Disorder (Brendan Ochoa)  and substance use disorder. Acute risk factors for suicide include:  Pt denies, SI . Protective factors for this patient include: positive social support and Pt denies, SI . Considering these factors, the overall suicide risk at this point appears to be no risk. Patient is appropriate for outpatient follow up.  Brendan Ochoa is a 46 year old male who presents voluntary and unaccompanied to [place]. Clinician asked the pt, "what brought you to the hospital?" Pt reports, his drinking has increased, he was sober in March after going into treatment but relapsed at the end of May. Pt reports, he's been drinking consistently for the past two months (June/July) but is usage increased in July. Pt denies, SI, HI, AVH, self-injurious behaviors and access to weapons.   Pt reports, drinking a 12 pack of beers today but has been drinking a case of beer. Pt's BAL was 107 at 2245. Pt's UDS is positive for Marijuana.   Pt presents quiet, awake with normal speech. Pt's mood, affect was depressed. Pt's mood, affect was depressed. Pt's insight, judgement was fair.   Diagnosis: Alcohol abuse with Intoxication (Brendan Ochoa).                    Alcohol-Induced Mood Disorder (Brendan Ochoa).   Chief Complaint:  Chief Complaint  Patient presents with   Alcohol Intoxication   Alcohol Problem   Visit  Diagnosis:     CCA Screening, Triage and Referral (STR)  Patient Reported Information How did you hear about [name]? Self  What Is the Reason for Your Visit/Call Today? Pt presents to [facility] under the influence of alcohol. Pt states he is intoxicated and consumed a 12 pack of beers prior to coming to this facility, Pt states he is seeking alcohol use treatment. Pt states he drinks a 12 pack of beer daily. Pt reports going to treatment in march.Pt denies SI.HI and AVH.  How Long Has This Been Causing You Problems? > than 6 months  What Do You Feel Would Help You the Most Today? Alcohol or Drug Use Treatment   Have You Recently Had Any Thoughts About Hurting Yourself? No  Are You Planning to Commit Suicide/Harm Yourself At This time? No   Have you Recently Had Thoughts About [place]? No  Are You Planning to Harm Someone at This Time? No  Explanation: No data recorded  Have You Used Any Alcohol or Drugs in the Past 24 Hours? Yes  How Long Ago Did You Use Drugs or Alcohol? No data recorded What Did You Use and How Much? 12 pack of beer   Do You Currently Have a Therapist/Psychiatrist? No  Name of Therapist/Psychiatrist: No data recorded  Have You Been Recently Discharged From Any Office Practice or Programs? Yes  Explanation of Discharge From Practice/Program: Pt stated he was asked to leave [facility] due to substance use.     CCA Screening Triage Referral Assessment  Type of Contact: Face-to-Face  Telemedicine Service Delivery:   Is this Initial or Reassessment? No data recorded Date Telepsych consult ordered in CHL:  No data recorded Time Telepsych consult ordered in CHL:  No data recorded Location of Assessment: [facility]  Provider Location: [facility] Services   Collateral Involvement: none   Does Patient Have a [place]? No data recorded Name and Contact of Legal Guardian: No data recorded If Minor and  Not Living with Parent(s), Who has Custody? N/A  Is CPS involved or ever been involved? -- Brendan Ochoa)  Is APS involved or ever been involved? -- Brendan Ochoa)   Patient Determined To Be At Risk for Harm To Self or Others Based on Review of Patient Reported Information or Presenting Complaint? No  Method: No data recorded Availability of Means: No data recorded Intent: No data recorded Notification Required: No data recorded Additional Information for Danger to Others Potential: No data recorded Additional Comments for Danger to Others Potential: No data recorded Are There Guns or Other Weapons in Your Home? No data recorded Types of Guns/Weapons: No data recorded Are These Weapons Safely Secured?                            No data recorded Who Could Verify You Are Able To Have These Secured: No data recorded Do You Have any Outstanding Charges, Pending Court Dates, Parole/Probation? No data recorded Contacted To Inform of Risk of Harm To Self or Others: -- (N/A)    Does Patient Present under Involuntary Commitment? No  IVC Papers Initial File Date: No data recorded  [place] of Residence: [name]   Patient Currently Receiving the Following Services: -- ([facility])   Determination of Need: Urgent (48 hours)   Options For Referral: Facility-Based Crisis; Outpatient Therapy; Medication Management     CCA Biopsychosocial Patient Reported Schizophrenia/Schizoaffective Diagnosis in Past: No   Strengths: Pt is voluntarily seeking treatment for his SA.   Mental Health Symptoms Depression:   Difficulty Concentrating; Hopelessness; Worthlessness; Irritability; Increase/decrease in appetite; Sleep (too much or little) (Despondent, isolation.)   Duration of Depressive symptoms:    Mania:   None   Anxiety:    Tension; Irritability; Difficulty concentrating; Restlessness   Psychosis:   None   Duration of Psychotic symptoms:    Trauma:   None   Obsessions:    None   Compulsions:   None   Inattention:   Disorganized; Forgetful; Loses things   Hyperactivity/Impulsivity:   Feeling of restlessness; Fidgets with hands/feet   Oppositional/Defiant Behaviors:   None   Emotional Irregularity:   Mood lability   Other Mood/Personality Symptoms:   None noted    Mental Status Exam Appearance and self-care  Stature:   Average   Weight:   Average weight   Clothing:   Casual   Grooming:   Normal   Cosmetic use:   None   Posture/gait:   Normal   Motor activity:   Not Remarkable   Sensorium  Attention:   Normal   Concentration:   Normal   Orientation:   X5   Recall/memory:   Normal   Affect and Mood  Affect:   Depressed   Mood:   Depressed   Relating  Eye contact:   Normal   Facial expression:   Responsive   Attitude toward examiner:   Cooperative   Thought and Language  Speech flow:  Normal   Thought content:   Appropriate to Mood and Circumstances   Preoccupation:   None   Hallucinations:   None   Organization:  No data recorded  [employer] of Knowledge:   Good   Intelligence:   Average   Abstraction:   Normal   Judgement:   Fair   Reality Testing:   Adequate   Insight:   Fair   Decision Making:   Impulsive   Social Functioning  Social Maturity:   Impulsive   Social Judgement:   Heedless   Stress  Stressors:   Other (Comment) (Family.)   Coping Ability:   Overwhelmed   Skill Deficits:   Self-control   Supports:   Family     Religion: Religion/Spirituality Are You A Religious Person?: Yes What is Your Religious Affiliation?: Catholic  Leisure/Recreation: Leisure / Recreation Do You Have Hobbies?: Yes Leisure and Hobbies: Pt reports, being in the outdoors.  Exercise/Diet: Exercise/Diet Do You Exercise?: Yes What Type of Exercise Do You Do?: Other (Comment) (Sports.) Do You Follow a Special Diet?: No Do You Have Any Trouble  Sleeping?: Yes Explanation of Sleeping Difficulties: Pt reports, getting 3-4 hours of sleep per night.   CCA Employment/Education Employment/Work Situation: Employment / Work Situation Employment Situation: Employed Has Patient ever Been in Passenger transport manager?: No  Education: Education Is Patient Currently Attending School?: No Last Grade Completed: 12 Did You Nutritional therapist?: Yes What Type of College Degree Do you Have?: [place], [place].   CCA Family/Childhood History Family and Relationship History: Family history Marital status: Single Does patient have children?: No  Childhood History:  Childhood History By whom was/is the patient raised?: Both parents (Per chart.) Did patient suffer any verbal/emotional/physical/sexual abuse as a child?: No Did patient suffer from severe childhood neglect?: No Has patient ever been sexually abused/assaulted/raped as an adolescent or adult?: No Was the patient ever a victim of a crime or a disaster?: No Witnessed domestic violence?: No Has patient been affected by domestic violence as an adult?:  (NA)  Child/Adolescent Assessment:     CCA Substance Use Alcohol/Drug Use: Alcohol / Drug Use Pain Medications: See MAR Prescriptions: See MAR Over the Counter: See MAR History of alcohol / drug use?: Yes Withdrawal Symptoms: Agitation, Cramps, Diarrhea, Irritability, Nausea / Vomiting, Sweats, Tingling Substance #1 Name of Substance 1: Alcohol. 1 - Age of First Use: UTA 1 - Amount (size/oz): Pt reports, drinking a 12 pack of beers today but has been drinking a case of beer. 1 - Frequency: Pt reports, drinking 4-5 hours ago. 1 - Duration: Ongoing. 1 - Last Use / Amount: Ongoing. 1 - Method of Aquiring: Purchase. 1- Route of Use: Oral.    ASAM's:  Six Dimensions of Multidimensional Assessment  Dimension 1:  Acute Intoxication and/or Withdrawal Potential:   Dimension 1:  Description of individual's past and current experiences of  substance use and withdrawal: Pt reports, experiencing cramps, agitation, irritability, nausea, sweating and tingling.  Dimension 2:  Biomedical Conditions and Complications:   Dimension 2:  Description of patient's biomedical conditions and  complications: Pt reports, experiencing withdrawls symptoms.  Dimension 3:  Emotional, Behavioral, or Cognitive Conditions and Complications:  Dimension 3:  Description of emotional, behavioral, or cognitive conditions and complications: Pt reports, depression and anxiety symptoms.  Dimension 4:  Readiness to Change:  Dimension 4:  Description of Readiness to Change criteria: Pt reports, he came back because he ready to change and start recovery.  Dimension 5:  Relapse, Continued use, or Continued Problem Potential:  Dimension 5:  Relapse, continued use, or continued problem potential critiera description: Pt reports, he was he started drinking at the end of May, he picked back up over the past two months (June/July) and drinks everyday this month (July.)  Dimension 6:  Recovery/Living Environment:  Dimension 6:  Recovery/Iiving environment criteria description: Pt reports, he's ready to enter recovery.  ASAM Severity Score: ASAM's Severity Rating Score: 9  ASAM Recommended Level of Treatment: ASAM Recommended Level of Treatment: Level II Intensive Outpatient Treatment   Substance use Disorder (SUD) Substance Use Disorder (SUD)  Checklist Symptoms of Substance Use: Continued use despite having a persistent/recurrent physical/psychological problem caused/exacerbated by use, Continued use despite persistent or recurrent social, interpersonal problems, caused or exacerbated by use, Evidence of tolerance  Recommendations for Services/Supports/Treatments: Recommendations for Services/Supports/Treatments Recommendations For Services/Supports/Treatments: Facility Based Crisis  Discharge Disposition:    DSM5 Diagnoses: Patient Active Problem List   Diagnosis Date  Noted   Alcohol use disorder, severe, dependence ([place])    Cocaine use disorder, severe, dependence ([place])    Cannabis use disorder, severe, dependence ([place])    Alcohol abuse with withdrawal ([place]) 04/27/2021   Acute encephalopathy    Hypokalemia    Tobacco abuse    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) 01/01/2020   Vomiting 01/01/2020   Cocaine abuse ([place]) 01/01/2020   Alcohol abuse 01/01/2020   Ileitis 02/18/2018   HYPERGLYCEMIA 11/01/2009   Tobacco dependence 07/18/2007   SUBSTANCE ABUSE, MULTIPLE 07/18/2007   ALLERGIC RHINITIS 07/18/2007   ASTHMA 07/18/2007   GERD 07/18/2007   Crohn's disease ([place]) 07/18/2007     Referrals to Alternative Service(s): Referred to Alternative Service(s):   Place:   Date:   Time:    Referred to Alternative Service(s):   Place:   Date:   Time:    Referred to Alternative Service(s):   Place:   Date:   Time:    Referred to Alternative Service(s):   Place:   Date:   Time:     Vertell Novak, [facility] Comprehensive Clinical Assessment (CCA) Screening, Triage and Referral Note  12/02/2021 Brendan Ochoa [id number]  Chief Complaint:  Chief Complaint  Patient presents with   Alcohol Intoxication   Alcohol Problem   Visit Diagnosis:   Patient Reported Information How did you hear about [name]? Self  What Is the Reason for Your Visit/Call Today? Pt presents to [facility] under the influence of alcohol. Pt states he is intoxicated and consumed a 12 pack of beers prior to coming to this facility, Pt states he is seeking alcohol use treatment. Pt states he drinks a 12 pack of  beer daily. Pt reports going to treatment in march.Pt denies SI.HI and AVH.  How Long Has This Been Causing You Problems? > than 6 months  What Do You Feel Would Help You the Most Today? Alcohol or Drug Use Treatment   Have You Recently Had Any Thoughts About Hurting Yourself? No  Are You Planning to Commit Suicide/Harm Yourself At This time? No   Have you Recently Had Thoughts About [place]? No  Are You Planning  to Harm Someone at This Time? No  Explanation: No data recorded  Have You Used Any Alcohol or Drugs in the Past 24 Hours? Yes  How Long Ago Did You Use Drugs or Alcohol? No data recorded What Did You Use and How Much? 12 pack of beer   Do You Currently Have a Therapist/Psychiatrist? No  Name of Therapist/Psychiatrist: No data recorded  Have You Been Recently Discharged From Any Office Practice or Programs? Yes  Explanation of Discharge From Practice/Program: Pt stated he was asked to leave [facility] due to substance use.    CCA Screening Triage Referral Assessment Type of Contact: Face-to-Face  Telemedicine Service Delivery:   Is this Initial or Reassessment? No data recorded Date Telepsych consult ordered in CHL:  No data recorded Time Telepsych consult ordered in CHL:  No data recorded Location of Assessment: [facility] Assessment Services  Provider Location: [facility] Assessment Services   Collateral Involvement: none   Does Patient Have a [place]? No data recorded Name and Contact of Legal Guardian: No data recorded If Minor and Not Living with Parent(s), Who has Custody? N/A  Is CPS involved or ever been involved? -- Brendan Ochoa)  Is APS involved or ever been involved? -- Brendan Ochoa)   Patient Determined To Be At Risk for Harm To Self or Others Based on Review of Patient Reported Information or Presenting Complaint? No  Method: No data recorded Availability of Means: No data recorded Intent: No data recorded Notification  Required: No data recorded Additional Information for Danger to Others Potential: No data recorded Additional Comments for Danger to Others Potential: No data recorded Are There Guns or Other Weapons in Your Home? No data recorded Types of Guns/Weapons: No data recorded Are These Weapons Safely Secured?                            No data recorded Who Could Verify You Are Able To Have These Secured: No data recorded Do You Have any Outstanding Charges, Pending Court Dates, Parole/Probation? No data recorded Contacted To Inform of Risk of Harm To Self or Others: -- (N/A)   Does Patient Present under Involuntary Commitment? No  IVC Papers Initial File Date: No data recorded  [place] of Residence: [name]   Patient Currently Receiving the Following Services: -- ([facility])   Determination of Need: Urgent (48 hours)   Options For Referral: Facility-Based Crisis; Outpatient Therapy; Medication Management   Discharge Disposition:     Vertell Novak, [place], [place], [facility], [facility] Triage Specialist [phone]

## 2021-12-02 NOTE — ED Provider Notes (Signed)
FBC/OBS ASAP Discharge Summary  Date and Time: 12/02/2021 2:55 PM  Name: Brendan Ochoa  MRN:  [id number]   Discharge Diagnoses:  Final diagnoses:  Alcohol abuse with intoxication ([place])  Alcohol-induced mood disorder ([place])    Subjective:  Brendan Ochoa is a 46 year old male that was seen and evaluated face-to-face.  States he currently resides in [place] and was advised to follow-up for treatment due to recent relapse.  States states he was sober for 1 month with a recent relapse.  Reports he has a good support system and is requesting to discharge at this time.  He adamantly denies suicidal or homicidal ideations.  Denies auditory visual hallucinations.  Reports he is seeking additional outpatient resources for therapy and psychiatry for medication management.  During evaluation Brendan Ochoa is sitting in no acute distress.  He is alert/oriented x 4; calm/cooperative; and mood congruent with affect.  he is speaking in a clear tone at moderate volume, and normal pace; with good eye contact.  his thought process is coherent and relevant; There is no indication that he is currently responding to internal/external stimuli or experiencing delusional thought content; and he has denied suicidal/self-harm/homicidal ideation, psychosis, and paranoia.  Patient has remained calm throughout assessment and has answered questions appropriately.     Brendan Ochoa is educated and verbalizes understanding of mental health resources and other crisis services in the community. He is instructed to call 911 and present to the nearest emergency room should he experience any suicidal/homicidal ideation, auditory/visual/hallucinations, or detrimental worsening of his mental health condition.  he was a also advised by Probation officer that he could call the toll-free phone on insurance card to assist with identifying in network counselors and agencies or number on back of Medicaid card to speak with care  coordinator    Per initial admission assessment note: "On assessment, he appears intoxicated and smelled of alcohol.  He is oriented and cooperative.  Patient's speech is  coherent.  He is able to maintain good eye contact.  His mood is depressed with congruent affect.  There is no evidence that patient is responding to any internal/external stimuli or experiencing any delusional thought content during this assessment.Patient reports that he relapsed on alcohol approximately 2 months ago.  He says he completed substance abuse treatment program in April, 2023 and was able to remain sober for approximately 1 month.  He says he relapsed due to stress and conflict with his family members. He says he drinks 12-24 cans of beers daily. He denies history of alcohol withdrawal seizures or DTs. Patient reports that he his motivated to get sober and wants to detox from alcohol with plan to complete rehab through [facility]. He say he had already contacted [facility] and has made a $120 deposit towards his rehab. He say he has a room at [facility] once he completes detox. He also reports using a total of 1 gram of cocaine weekly. He denies all other substance abuse"   Stay Summary:   Total Time spent with patient: 15 minutes  Past Psychiatric History:  Past Medical History:  Past Medical History:  Diagnosis Date   Asthma    Cocaine abuse ([place])    in past   Crohn disease ([place]) 2008   Depression    in past, not current as of 2019    ETOH abuse    history of alcohol use   GERD (gastroesophageal reflux disease)     Past Surgical  History:  Procedure Laterality Date   BIOPSY  10/15/2017   Procedure: BIOPSY;  Surgeon: Danie Binder, MD;  Location: [facility];  Service: Endoscopy;;  terminal ileum colon   COLONOSCOPY WITH PROPOFOL N/A 10/15/2017   redundant colon, normal rectum, external and internal hemorrhoids, quiescent crohn's. Pathology: inactive chronicn nonspecific ileitis    ESOPHAGOGASTRODUODENOSCOPY (EGD) WITH PROPOFOL N/A 10/15/2017   normal esophagus, small hiatal hernia, gastritis, duodenitis   FINGER AMPUTATION Right 2012   cut finger table saw and surgically amputated   Family History:  Family History  Problem Relation Age of Onset   Stomach cancer Maternal Grandfather    Colon cancer Neg Hx    Family Psychiatric History:  Social History:  Social History   Substance and Sexual Activity  Alcohol Use Not Currently   Comment: alcoholic- last use 8/52/77     Social History   Substance and Sexual Activity  Drug Use Not Currently   Types: Cocaine, Marijuana, Methamphetamines   Comment: last cocaine use 10/10/20.  last meth april 2022    Social History   Socioeconomic History   Marital status: Single    Spouse name: Not on file   Number of children: Not on file   Years of education: Not on file   Highest education level: Not on file  Occupational History   Not on file  Tobacco Use   Smoking status: Every Day    Packs/day: 0.50    Years: 15.00    Total pack years: 7.50    Types: Cigarettes   Smokeless tobacco: Never   Tobacco comments:    1/4 to 1/2 pack per day   Vaping Use   Vaping Use: Never used  Substance and Sexual Activity   Alcohol use: Not Currently    Comment: alcoholic- last use 01/04/22   Drug use: Not Currently    Types: Cocaine, Marijuana, Methamphetamines    Comment: last cocaine use 10/10/20.  last meth april 2022   Sexual activity: Not on file  Other Topics Concern   Not on file  Social History Narrative   Not on file   Social Determinants of Health   Financial Resource Strain: Not on file  Food Insecurity: Not on file  Transportation Needs: Not on file  Physical Activity: Not on file  Stress: Not on file  Social Connections: Not on file   SDOH:  SDOH Screenings   Alcohol Screen: Not on file  Depression (PHQ2-9): Medium Risk (12/01/2021)   Depression (PHQ2-9)    PHQ-2 Score: 19  Financial Resource  Strain: Not on file  Food Insecurity: Not on file  Housing: Not on file  Physical Activity: Not on file  Social Connections: Not on file  Stress: Not on file  Tobacco Use: High Risk (04/27/2021)   Patient History    Smoking Tobacco Use: Every Day    Smokeless Tobacco Use: Never    Passive Exposure: Not on file  Transportation Needs: Not on file    Tobacco Cessation:  N/A, patient does not currently use tobacco products  Current Medications:  Current Facility-Administered Medications  Medication Dose Route Frequency Provider Last Rate Last Admin   acetaminophen (TYLENOL) tablet 650 mg  650 mg Oral Q6H PRN Ajibola, Ene A, NP       alum & mag hydroxide-simeth (MAALOX/MYLANTA) 200-200-20 MG/5ML suspension 30 mL  30 mL Oral Q4H PRN Ajibola, Ene A, NP   30 mL at 12/02/21 1334   gabapentin (NEURONTIN) capsule 100 mg  100  mg Oral TID Ajibola, Ene A, NP   100 mg at 12/02/21 0943   hydrOXYzine (ATARAX) tablet 25 mg  25 mg Oral Q6H PRN Ajibola, Ene A, NP   25 mg at 12/02/21 1019   lamoTRIgine (LAMICTAL) tablet 25 mg  25 mg Oral Daily Ajibola, Ene A, NP   25 mg at 12/02/21 0943   loperamide (IMODIUM) capsule 2-4 mg  2-4 mg Oral PRN Ajibola, Ene A, NP       LORazepam (ATIVAN) tablet 1 mg  1 mg Oral Q6H PRN Ajibola, Ene A, NP   1 mg at 12/01/21 2304   magnesium hydroxide (MILK OF MAGNESIA) suspension 30 mL  30 mL Oral Daily PRN Ajibola, Ene A, NP       multivitamin with minerals tablet 1 tablet  1 tablet Oral Daily Ajibola, Ene A, NP   1 tablet at 12/02/21 0944   nicotine (NICODERM CQ - dosed in mg/24 hours) 21 mg/24hr patch            nicotine (NICODERM CQ - dosed in mg/24 hours) patch 21 mg  21 mg Transdermal Daily Derrill Center, NP   21 mg at 12/02/21 1020   ondansetron (ZOFRAN-ODT) disintegrating tablet 4 mg  4 mg Oral Q6H PRN Ajibola, Ene A, NP   4 mg at 12/02/21 0945   thiamine tablet 100 mg  100 mg Oral Daily Ajibola, Ene A, NP   100 mg at 12/02/21 0943   traZODone (DESYREL) tablet 50 mg   50 mg Oral QHS PRN Ajibola, Ene A, NP       Current Outpatient Medications  Medication Sig Dispense Refill   albuterol (VENTOLIN HFA) 108 (90 Base) MCG/ACT inhaler Inhale 2 puffs into the lungs every 6 (six) hours as needed for wheezing or shortness of breath. 8 g 1   gabapentin (NEURONTIN) 300 MG capsule Take 300 mg by mouth 3 (three) times daily.     lamoTRIgine (LAMICTAL) 25 MG tablet Take 1 tablet (25 mg total) by mouth daily. 30 tablet 1   mirtazapine (REMERON) 15 MG tablet Take 15 mg by mouth at bedtime.     traZODone (DESYREL) 50 MG tablet Take 1 tablet (50 mg total) by mouth at bedtime as needed for sleep. 30 tablet 1    PTA Medications: (Not in a hospital admission)      12/01/2021    9:53 PM 07/25/2021    2:41 PM 04/27/2021   12:38 AM  Depression screen PHQ 2/9  Decreased Interest 1 1 1   Down, Depressed, Hopeless 1 1 3   PHQ - 2 Score 2 2 4   Altered sleeping 3 1 3   Tired, decreased energy 3 1 3   Change in appetite 3 1 2   Feeling bad or failure about yourself  3 1 3   Trouble concentrating 2 1 2   Moving slowly or fidgety/restless 3 1 1   Suicidal thoughts 0 1 1  PHQ-9 Score 19 9 19   Difficult doing work/chores Very difficult Somewhat difficult Extremely dIfficult    Flowsheet Row ED from 12/01/2021 in [facility] ED from [facility]/25/2021 in [facility] ED from 06/08/2021 in [facility] No Risk High Risk No Risk       Musculoskeletal  Strength & Muscle Tone: within normal limits Gait & Station: normal Patient leans: N/A  Psychiatric Specialty Exam  Presentation  General Appearance: Appropriate for Environment  Eye Contact:Good  Speech:Clear and Coherent  Speech Volume:Normal  Handedness:Right   Mood and Affect  Mood:Depressed  Affect:Congruent   Thought Process  Thought Processes:Coherent  Descriptions of Associations:Intact  Orientation:Full  (Time, Place and Person)  Thought Content:WDL  Diagnosis of Schizophrenia or Schizoaffective disorder in past: No    Hallucinations:Hallucinations: None  Ideas of Reference:None  Suicidal Thoughts:Suicidal Thoughts: No  Homicidal Thoughts:Homicidal Thoughts: No   Sensorium  Memory:Immediate Good; Recent Good; Remote Good  Judgment:Fair  Insight:Good   Executive Functions  Concentration:Good  Attention Span:Good  [place] of Knowledge:Good  Language:Good   Psychomotor Activity  Psychomotor Activity:Psychomotor Activity: Normal   Assets  Assets:Communication Skills; Desire for Improvement; Physical Health   Sleep  Sleep:Sleep: Poor Number of Hours of Sleep: 3.5   Nutritional Assessment (For OBS and FBC admissions only) Has the patient had a weight loss or gain of 10 pounds or more in the last 3 months?: No Has the patient had a decrease in food intake/or appetite?: Yes Does the patient have dental problems?: No Does the patient have eating habits or behaviors that may be indicators of an eating disorder including binging or inducing vomiting?: No Has the patient recently lost weight without trying?: 0 Has the patient been eating poorly because of a decreased appetite?: 0 Malnutrition Screening Tool Score: 0    Physical Exam  Physical Exam Vitals and nursing note reviewed.  HENT:     Head: Normocephalic.  Cardiovascular:     Rate and Rhythm: Normal rate and regular rhythm.  Neurological:     Mental Status: He is alert and oriented to person, place, and time.  Psychiatric:        Mood and Affect: Mood normal.        Thought Content: Thought content normal.    Review of Systems  Respiratory: Negative.    Cardiovascular: Negative.   Gastrointestinal: Negative.   Genitourinary: Negative.   Skin: Negative.   Psychiatric/Behavioral:  Positive for depression and substance abuse.   All other systems reviewed and are negative.  Blood  pressure 137/83, pulse 62, temperature 97.9 F (36.6 C), temperature source Oral, resp. rate 18, SpO2 96 %. There is no height or weight on file to calculate BMI.  Demographic Factors:  Male  Loss Factors: Financial problems/change in socioeconomic status  Historical Factors: Family history of mental illness or substance abuse  Risk Reduction Factors:   Living with another person, especially a relative, Positive social support, and Positive therapeutic relationship  Continued Clinical Symptoms:  Depression:   Comorbid alcohol abuse/dependence  Cognitive Features That Contribute To Risk:  Closed-mindedness    Suicide Risk:  Minimal: No identifiable suicidal ideation.  Patients presenting with no risk factors but with morbid ruminations; may be classified as minimal risk based on the severity of the depressive symptoms  Plan Of Care/Follow-up recommendations:  Activity:  as tolerated Diet:  heart healthy  Disposition: Take all medications as prescribed. Keep all follow-up appointments as scheduled.  Do not consume alcohol or use illegal drugs while on prescription medications. Report any adverse effects from your medications to your primary care provider promptly.  In the event of recurrent symptoms or worsening symptoms, call 911, a crisis hotline, or go to the nearest emergency department for evaluation.    Derrill Center, NP 12/02/2021, 2:55 PM

## 2021-12-02 NOTE — ED Notes (Signed)
Pt sleeping@this  time. Breathing even and unlabored. Will continue to monitor for safety

## 2021-12-02 NOTE — ED Notes (Signed)
Pt was given chicken, veggies, and juice for lunch.

## 2021-12-02 NOTE — ED Notes (Signed)
Pt has been asking to leave. States that he has an [place] that " I already paid for".  Pt asked for a bus pass and follow up information.  Myrle Sheng  NP notified and discharged orders obtained. Pt was given back all his belongings including his phone from locker 19.  He was given AVS along follow up instructions and information about AA.  He was escorted to lobby a instructed on where to catch the bus.  No signs of distress noted at time of discharged.

## 2021-12-02 NOTE — Discharge Instructions (Addendum)
Take all medications as prescribed. Keep all follow-up appointments as scheduled.  Do not consume alcohol or use illegal drugs while on prescription medications. Report any adverse effects from your medications to your primary care provider promptly.  In the event of recurrent symptoms or worsening symptoms, call 911, a crisis hotline, or go to the nearest emergency department for evaluation.

## 2021-12-12 ENCOUNTER — Ambulatory Visit: Payer: Self-pay | Admitting: Physician Assistant

## 2022-05-03 ENCOUNTER — Other Ambulatory Visit (HOSPITAL_COMMUNITY)
Admission: EM | Admit: 2022-05-03 | Discharge: 2022-05-05 | Disposition: A | Discharge status: Home / Self Care | Payer: No Payment, Other | Attending: Psychiatry | Admitting: Psychiatry

## 2022-05-03 DIAGNOSIS — Z1152 Encounter for screening for COVID-19: Secondary | ICD-10-CM | POA: Diagnosis not present

## 2022-05-03 DIAGNOSIS — F1020 Alcohol dependence, uncomplicated: Secondary | ICD-10-CM | POA: Diagnosis not present

## 2022-05-03 LAB — POCT URINE DRUG SCREEN - MANUAL ENTRY (I-SCREEN)
POC Amphetamine UR: NOT DETECTED
POC Buprenorphine (BUP): NOT DETECTED
POC Cocaine UR: NOT DETECTED
POC Marijuana UR: POSITIVE — AB
POC Methadone UR: NOT DETECTED
POC Methamphetamine UR: NOT DETECTED
POC Morphine: NOT DETECTED
POC Oxazepam (BZO): NOT DETECTED
POC Oxycodone UR: NOT DETECTED
POC Secobarbital (BAR): NOT DETECTED

## 2022-05-03 LAB — COMPREHENSIVE METABOLIC PANEL
ALT: 14 U/L (ref 0–44)
AST: 22 U/L (ref 15–41)
Albumin: 4.4 g/dL (ref 3.5–5.0)
Alkaline Phosphatase: 74 U/L (ref 38–126)
Anion gap: 12 (ref 5–15)
BUN: 13 mg/dL (ref 6–20)
CO2: 22 mmol/L (ref 22–32)
Calcium: 9.1 mg/dL (ref 8.9–10.3)
Chloride: 102 mmol/L (ref 98–111)
Creatinine, Ser: 0.94 mg/dL (ref 0.61–1.24)
GFR, Estimated: >60 mL/min (ref >60)
Glucose, Bld: 109 mg/dL — ABNORMAL HIGH (ref 70–99)
Potassium: 3.9 mmol/L (ref 3.5–5.1)
Sodium: 136 mmol/L (ref 135–145)
Total Bilirubin: 0.4 mg/dL (ref 0.3–1.2)
Total Protein: 8.1 g/dL (ref 6.5–8.1)

## 2022-05-03 LAB — ETHANOL: Alcohol, Ethyl (B): 211 mg/dL — ABNORMAL HIGH (ref <10)

## 2022-05-03 LAB — CBC WITH DIFFERENTIAL/PLATELET
Abs Immature Granulocytes: 0.01 10*3/uL (ref 0.00–0.07)
Basophils Absolute: 0.1 10*3/uL (ref 0.0–0.1)
Basophils Relative: 1 %
Eosinophils Absolute: 0.1 10*3/uL (ref 0.0–0.5)
Eosinophils Relative: 2 %
HCT: 48.9 % (ref 39.0–52.0)
Hemoglobin: 17.3 g/dL — ABNORMAL HIGH (ref 13.0–17.0)
Immature Granulocytes: 0 %
Lymphocytes Relative: 37 %
Lymphs Abs: 2.8 10*3/uL (ref 0.7–4.0)
MCH: 31.2 pg (ref 26.0–34.0)
MCHC: 35.4 g/dL (ref 30.0–36.0)
MCV: 88.1 fL (ref 80.0–100.0)
Monocytes Absolute: 0.5 10*3/uL (ref 0.1–1.0)
Monocytes Relative: 6 %
Neutro Abs: 4.1 10*3/uL (ref 1.7–7.7)
Neutrophils Relative %: 54 %
Platelets: 292 10*3/uL (ref 150–400)
RBC: 5.55 MIL/uL (ref 4.22–5.81)
RDW: 13.3 % (ref 11.5–15.5)
WBC: 7.5 10*3/uL (ref 4.0–10.5)
nRBC: 0 % (ref 0.0–0.2)

## 2022-05-03 LAB — LIPID PANEL
Cholesterol: 245 mg/dL — ABNORMAL HIGH (ref 0–200)
HDL: 104 mg/dL (ref >40)
LDL Cholesterol: 75 mg/dL (ref 0–99)
Total CHOL/HDL Ratio: 2.4 RATIO
Triglycerides: 331 mg/dL — ABNORMAL HIGH (ref <150)
VLDL: 66 mg/dL — ABNORMAL HIGH (ref 0–40)

## 2022-05-03 LAB — TSH: TSH: 0.94 u[IU]/mL (ref 0.350–4.500)

## 2022-05-03 LAB — RESP PANEL BY RT-PCR (RSV, FLU A&B, COVID)  RVPGX2
Influenza A by PCR: NEGATIVE
Influenza B by PCR: NEGATIVE
Resp Syncytial Virus by PCR: NEGATIVE
SARS Coronavirus 2 by RT PCR: NEGATIVE

## 2022-05-03 LAB — POC SARS CORONAVIRUS 2 AG: SARSCOV2ONAVIRUS 2 AG: NEGATIVE

## 2022-05-03 LAB — MAGNESIUM: Magnesium: 2.3 mg/dL (ref 1.7–2.4)

## 2022-05-03 MED ORDER — ACETAMINOPHEN 325 MG PO TABS: 650.0000 mg | ORAL_TABLET | Freq: Four times a day (QID) | ORAL | Status: DC | PRN

## 2022-05-03 MED ORDER — NICOTINE 14 MG/24HR TD PT24
14.0000 mg | MEDICATED_PATCH | Freq: Every day | TRANSDERMAL | Status: DC
  Administered 2022-05-03 – 2022-05-04 (×2): 14 mg via TRANSDERMAL
  Filled 2022-05-03 (×2): qty 1

## 2022-05-03 MED ORDER — SERTRALINE HCL 50 MG PO TABS
50.0000 mg | ORAL_TABLET | Freq: Every day | ORAL | Status: DC
  Administered 2022-05-04: 50 mg via ORAL
  Filled 2022-05-03: qty 1

## 2022-05-03 MED ORDER — ONDANSETRON 4 MG PO TBDP
4.0000 mg | ORAL_TABLET | Freq: Four times a day (QID) | ORAL | Status: DC | PRN
  Administered 2022-05-03: 4 mg via ORAL
  Filled 2022-05-03: qty 1

## 2022-05-03 MED ORDER — LORAZEPAM 1 MG PO TABS: 1.0000 mg | ORAL_TABLET | Freq: Every day | ORAL | Status: DC

## 2022-05-03 MED ORDER — HYDROXYZINE HCL 25 MG PO TABS
25.0000 mg | ORAL_TABLET | Freq: Three times a day (TID) | ORAL | Status: DC | PRN
  Filled 2022-05-03: qty 10

## 2022-05-03 MED ORDER — ALUM & MAG HYDROXIDE-SIMETH 200-200-20 MG/5ML PO SUSP
30.0000 mL | ORAL | Status: DC | PRN
Start: 2022-05-03 — End: 2022-05-05

## 2022-05-03 MED ORDER — LORAZEPAM 1 MG PO TABS: 1.0000 mg | ORAL_TABLET | Freq: Four times a day (QID) | ORAL | Status: DC | PRN

## 2022-05-03 MED ORDER — ADULT MULTIVITAMIN W/MINERALS CH
1.0000 | ORAL_TABLET | Freq: Every day | ORAL | Status: DC
  Administered 2022-05-03 – 2022-05-05 (×3): 1 via ORAL
  Filled 2022-05-03 (×3): qty 1
  Filled 2022-05-03: qty 7

## 2022-05-03 MED ORDER — LOPERAMIDE HCL 2 MG PO CAPS: 2.0000 mg | ORAL_CAPSULE | ORAL | Status: DC | PRN

## 2022-05-03 MED ORDER — THIAMINE MONONITRATE 100 MG PO TABS
100.0000 mg | ORAL_TABLET | Freq: Every day | ORAL | Status: DC
  Administered 2022-05-04 – 2022-05-05 (×2): 100 mg via ORAL
  Filled 2022-05-03 (×2): qty 1

## 2022-05-03 MED ORDER — LORAZEPAM 1 MG PO TABS: 1.0000 mg | ORAL_TABLET | Freq: Two times a day (BID) | ORAL | Status: DC

## 2022-05-03 MED ORDER — LORAZEPAM 1 MG PO TABS
1.0000 mg | ORAL_TABLET | Freq: Three times a day (TID) | ORAL | Status: DC
  Administered 2022-05-04 (×2): 1 mg via ORAL
  Filled 2022-05-03 (×2): qty 1

## 2022-05-03 MED ORDER — MAGNESIUM HYDROXIDE 400 MG/5ML PO SUSP: 30.0000 mL | Freq: Every day | ORAL | Status: DC | PRN

## 2022-05-03 MED ORDER — LORAZEPAM 1 MG PO TABS
1.0000 mg | ORAL_TABLET | Freq: Four times a day (QID) | ORAL | Status: AC
  Administered 2022-05-03 – 2022-05-04 (×4): 1 mg via ORAL
  Filled 2022-05-03 (×5): qty 1

## 2022-05-03 MED ORDER — TRAZODONE HCL 50 MG PO TABS
50.0000 mg | ORAL_TABLET | Freq: Every evening | ORAL | Status: DC | PRN
  Administered 2022-05-03 – 2022-05-04 (×2): 50 mg via ORAL
  Filled 2022-05-03 (×2): qty 1
  Filled 2022-05-03: qty 7

## 2022-05-03 MED ORDER — THIAMINE HCL 100 MG/ML IJ SOLN
100.0000 mg | Freq: Once | INTRAMUSCULAR | Status: DC
  Filled 2022-05-03: qty 2

## 2022-05-03 NOTE — ED Provider Notes (Addendum)
Facility Based Crisis Admission H&P  Date: 05/03/22 Patient Name: Brendan Ochoa MRN: [id number] Chief Complaint:  Chief Complaint  Patient presents with   Addiction Problem      Diagnoses:  Final diagnoses:  Alcohol use disorder, severe, dependence ([place])    HPI: Patient presents voluntarily to [facility] behavioral health for walk-in assessment.   Patient is assessed, face-to-face, by nurse practitioner. He is seated in assessment area, no acute distress. Consulted with provider, Dr. Leverne Humbles, and chart reviewed on 05/03/2022. He  is alert and oriented, pleasant and cooperative during assessment.   Patient  presents with depressed mood, congruent affect. He  denies suicidal and homicidal ideations. Denies history of suicide attempts, denies history of self-harm.  Patient easily contracts verbally for safety with this Probation officer.  Patient states "I am tired of this alcohol use, it is embarrassing, I have been back-and-forth so many times, I drink for 2 months and then I get tired of it, then I am sober for 2 months and I get tired of that."  Patient reports relapse on alcohol 2 months ago after approximately 3 months sobriety.  For the last 2 months patient consuming up to twenty-four 12 ounce beers per day.  Most recent alcohol use this morning, he consumed six, 12 ounce beers prior to arrival today.  He endorses nausea and intermittent vomiting x 2 months.  He denies history of alcohol-related seizure.  He also uses marijuana approximately 3 times per week.  He denies substance use aside from alcohol and marijuana.  He is an everyday tobacco cigarette smoker.  Patient graduated [employer] program in April 2023, remained sober 3 months post program completion. He has attended [employer], believes AA is therapeutic and plans to follow up with sponsor after residential substance use treatment.    Brendan Ochoa has been diagnosed with stimulant induced psychotic disorder, alcohol use  disorder, polysubstance use disorder, cannabis use disorder and cocaine use disorder.  He attends weekly meetings at family services of the [place] focus on substance use disorder as directed by his Engineer, manufacturing systems.  He endorses history of multiple previous inpatient psychiatric hospitalizations.  Family mental health history includes alcohol use disorder, multiple family members diagnosed with alcohol and substance use disorders. No current medications.  Patient denies current medications.  He states "I hate Prozac."  He reports he was prescribed Prozac several months ago however it "made me feel worse."  Reviewed sertraline, discussed potential side effects and offered opportunity to ask questions.  Patient verbalizes agreement with plan to initiate sertraline 50 mg daily.    Patient has normal speech and behavior.  He  denies auditory and visual hallucinations.  Patient is able to converse coherently with goal-directed thoughts and no distractibility or preoccupation.  Denies symptoms of paranoia.  Objectively there is no evidence of psychosis/mania or delusional thinking.  [place] resides in [employer]. He denies access to weapons. He is not currently employed. Patient endorses average sleep and decreased appetite.  Patient confirms that he would like to remain full CODE STATUS.  Patient offered support and encouragement.  He agrees with current treatment plan.   PHQ 2-9:  [place] ED from 05/03/2022 in [facility] ED from 12/01/2021 in [facility] ED from 07/25/2021 in [facility]  Thoughts that you would be better off dead, or of hurting yourself in some way More than half the days Not at all Several days  PHQ-9 Total Score  20 19 9        Flowsheet Row ED from 05/03/2022 in [facility] ED from [facility]01/20[facility] in [facility] ED from 07/25/2021 in  [facility] No Risk No Risk High Risk        Total Time spent with patient: 30 minutes  Musculoskeletal  Strength & Muscle Tone: within normal limits Gait & Station: normal Patient leans: N/A  Psychiatric Specialty Exam  Presentation General Appearance:  Appropriate for Environment; Casual  Eye Contact: Good  Speech: Clear and Coherent; Normal Rate  Speech Volume: Normal  Handedness: Right   Mood and Affect  Mood: Depressed  Affect: Congruent; Depressed   Thought Process  Thought Processes: Coherent; Goal Directed; Linear  Descriptions of Associations:Intact  Orientation:Full (Time, Place and Person)  Thought Content:Logical; WDL  Diagnosis of Schizophrenia or Schizoaffective disorder in past: No   Hallucinations:Hallucinations: None  Ideas of Reference:None  Suicidal Thoughts:Suicidal Thoughts: No  Homicidal Thoughts:Homicidal Thoughts: No   Sensorium  Memory: Immediate Good; Recent Good  Judgment: Fair  Insight: Good   Executive Functions  Concentration: Good  Attention Span: Good  Recall: Good  Fund of Knowledge: Good  Language: Good   Psychomotor Activity  Psychomotor Activity: Psychomotor Activity: Normal   Assets  Assets: Communication Skills; Desire for Improvement; Financial Resources/Insurance; Leisure Time; Physical Health; Resilience; Social Support   Sleep  Sleep: Sleep: Fair   Nutritional Assessment (For OBS and FBC admissions only) Has the patient had a weight loss or gain of 10 pounds or more in the last 3 months?: No Has the patient had a decrease in food intake/or appetite?: Yes Does the patient have dental problems?: No Does the patient have eating habits or behaviors that may be indicators of an eating disorder including binging or inducing vomiting?: No Has the patient recently lost weight without trying?: 0 Has the patient been eating  poorly because of a decreased appetite?: 1 Malnutrition Screening Tool Score: 1    Physical Exam Vitals and nursing note reviewed.  Constitutional:      Appearance: Normal appearance. He is well-developed.  HENT:     Head: Normocephalic and atraumatic.     Nose: Nose normal.  Cardiovascular:     Rate and Rhythm: Normal rate.  Pulmonary:     Effort: Pulmonary effort is normal.  Musculoskeletal:        General: Normal range of motion.     Cervical back: Normal range of motion.  Skin:    General: Skin is warm and dry.  Neurological:     Mental Status: He is alert and oriented to person, place, and time.  Psychiatric:        Attention and Perception: Attention and perception normal.        Mood and Affect: Affect normal. Mood is depressed.        Speech: Speech normal.        Behavior: Behavior normal. Behavior is cooperative.        Thought Content: Thought content normal.        Cognition and Memory: Cognition and memory normal.    Review of Systems  Constitutional: Negative.   HENT: Negative.    Eyes: Negative.   Respiratory: Negative.    Cardiovascular: Negative.   Gastrointestinal: Negative.   Genitourinary: Negative.   Musculoskeletal: Negative.   Skin: Negative.   Neurological: Negative.   Psychiatric/Behavioral:  Positive for depression and substance abuse.  Blood pressure 116/81, pulse 100, temperature 98 F (36.7 C), temperature source Oral, resp. rate 18, height 5' 11"  (1.803 m), weight 169 lb (76.7 kg), SpO2 95 %. Body mass index is 23.57 kg/m.  Past Psychiatric History: see above   Is the patient at risk to self? No  Has the patient been a risk to self in the past 6 months? No .    Has the patient been a risk to self within the distant past? No   Is the patient a risk to others? No   Has the patient been a risk to others in the past 6 months? No   Has the patient been a risk to others within the distant past? No   Past Medical History:  Past  Medical History:  Diagnosis Date   Asthma    Cocaine abuse ([place])    in past   Crohn disease ([place]) 2008   Depression    in past, not current as of 2019    ETOH abuse    history of alcohol use   GERD (gastroesophageal reflux disease)     Past Surgical History:  Procedure Laterality Date   BIOPSY  10/15/2017   Procedure: BIOPSY;  Surgeon: Danie Binder, MD;  Location: [facility];  Service: Endoscopy;;  terminal ileum colon   COLONOSCOPY WITH PROPOFOL N/A 10/15/2017   redundant colon, normal rectum, external and internal hemorrhoids, quiescent crohn's. Pathology: inactive chronicn nonspecific ileitis   ESOPHAGOGASTRODUODENOSCOPY (EGD) WITH PROPOFOL N/A 10/15/2017   normal esophagus, small hiatal hernia, gastritis, duodenitis   FINGER AMPUTATION Right 2012   cut finger table saw and surgically amputated    Family History:  Family History  Problem Relation Age of Onset   Stomach cancer Maternal Grandfather    Colon cancer Neg Hx     Social History:  Social History   Socioeconomic History   Marital status: Single    Spouse name: Not on file   Number of children: Not on file   Years of education: Not on file   Highest education level: Not on file  Occupational History   Not on file  Tobacco Use   Smoking status: Every Day    Packs/day: 0.50    Years: 15.00    Total pack years: 7.50    Types: Cigarettes   Smokeless tobacco: Never   Tobacco comments:    1/4 to 1/2 pack per day   Vaping Use   Vaping Use: Never used  Substance and Sexual Activity   Alcohol use: Not Currently    Comment: alcoholic- last use 2/42/35   Drug use: Not Currently    Types: Cocaine, Marijuana, Methamphetamines    Comment: last cocaine use 10/10/20.  last meth april 2022   Sexual activity: Not on file  Other Topics Concern   Not on file  Social History Narrative   Not on file   Social Determinants of Health   Financial Resource Strain: Not on file  Food Insecurity: Not on file   Transportation Needs: Not on file  Physical Activity: Not on file  Stress: Not on file  Social Connections: Not on file  Intimate Partner Violence: Not on file    SDOH:  SDOH Screenings   Depression (PHQ2-9): High Risk (05/03/2022)  Tobacco Use: High Risk (04/27/2021)    Last Labs:  Admission on 12/01/2021, Discharged on 12/02/2021  Component Date Value Ref Range Status   SARS Coronavirus 2 by RT PCR 12/01/2021 NEGATIVE  NEGATIVE Final  Comment: (NOTE) SARS-CoV-2 target nucleic acids are NOT DETECTED.  The SARS-CoV-2 RNA is generally detectable in upper respiratory specimens during the acute phase of infection. The lowest concentration of SARS-CoV-2 viral copies this assay can detect is 138 copies/mL. A negative result does not preclude SARS-Cov-2 infection and should not be used as the sole basis for treatment or other patient management decisions. A negative result may occur with  improper specimen collection/handling, submission of specimen other than nasopharyngeal swab, presence of viral mutation(s) within the areas targeted by this assay, and inadequate number of viral copies(<138 copies/mL). A negative result must be combined with clinical observations, patient history, and epidemiological information. The expected result is Negative.  Fact Sheet for Patients:  EntrepreneurPulse.com.au  Fact Sheet for Healthcare Providers:  IncredibleEmployment.be  This test is no                          t yet approved or cleared by the [place] FDA and  has been authorized for detection and/or diagnosis of SARS-CoV-2 by FDA under an Emergency Use Authorization (EUA). This EUA will remain  in effect (meaning this test can be used) for the duration of the COVID-19 declaration under Section 564(b)(1) of the Act, 21 U.S.C.section 360bbb-3(b)(1), unless the authorization is terminated  or revoked sooner.       Influenza A by PCR  12/01/2021 NEGATIVE  NEGATIVE Final   Influenza B by PCR 12/01/2021 NEGATIVE  NEGATIVE Final   Comment: (NOTE) The Xpert Xpress SARS-CoV-2/FLU/RSV plus assay is intended as an aid in the diagnosis of influenza from Nasopharyngeal swab specimens and should not be used as a sole basis for treatment. Nasal washings and aspirates are unacceptable for Xpert Xpress SARS-CoV-2/FLU/RSV testing.  Fact Sheet for Patients: EntrepreneurPulse.com.au  Fact Sheet for Healthcare Providers: IncredibleEmployment.be  This test is not yet approved or cleared by the [place] FDA and has been authorized for detection and/or diagnosis of SARS-CoV-2 by FDA under an Emergency Use Authorization (EUA). This EUA will remain in effect (meaning this test can be used) for the duration of the COVID-19 declaration under Section 564(b)(1) of the Act, 21 U.S.C. section 360bbb-3(b)(1), unless the authorization is terminated or revoked.  Performed at [facility], [place] [address], [place], [place] [address]    WBC 12/01/2021 5.2  4.0 - 10.5 K/uL Final   RBC 12/01/2021 4.86  4.22 - 5.81 MIL/uL Final   Hemoglobin 12/01/2021 14.8  13.0 - 17.0 g/dL Final   HCT 12/01/2021 43.5  39.0 - 52.0 % Final   MCV 12/01/2021 89.5  80.0 - 100.0 fL Final   MCH 12/01/2021 30.5  26.0 - 34.0 pg Final   MCHC 12/01/2021 34.0  30.0 - 36.0 g/dL Final   RDW 12/01/2021 14.1  11.5 - 15.5 % Final   Platelets 12/01/2021 243  150 - 400 K/uL Final   nRBC 12/01/2021 0.0  0.0 - 0.2 % Final   Neutrophils Relative % 12/01/2021 43  % Final   Neutro Abs 12/01/2021 2.3  1.7 - 7.7 K/uL Final   Lymphocytes Relative 12/01/2021 46  % Final   Lymphs Abs 12/01/2021 2.4  0.7 - 4.0 K/uL Final   Monocytes Relative 12/01/2021 8  % Final   Monocytes Absolute 12/01/2021 0.4  0.1 - 1.0 K/uL Final   Eosinophils Relative 12/01/2021 2  % Final   Eosinophils Absolute 12/01/2021 0.1  0.0 - 0.5 K/uL Final   Basophils  Relative 12/01/2021  1  % Final   Basophils Absolute 12/01/2021 0.0  0.0 - 0.1 K/uL Final   Immature Granulocytes 12/01/2021 0  % Final   Abs Immature Granulocytes 12/01/2021 0.01  0.00 - 0.07 K/uL Final   Performed at [facility] [address], [place], [place] [address]   Sodium 12/01/2021 140  135 - 145 mmol/L Final   Potassium 12/01/2021 3.8  3.5 - 5.1 mmol/L Final   Chloride 12/01/2021 106  98 - 111 mmol/L Final   CO2 12/01/2021 23  22 - 32 mmol/L Final   Glucose, Bld 12/01/2021 94  70 - 99 mg/dL Final   Glucose reference range applies only to samples taken after fasting for at least 8 hours.   BUN 12/01/2021 16  6 - 20 mg/dL Final   Creatinine, Ser 12/01/2021 0.87  0.61 - 1.24 mg/dL Final   Calcium 12/01/2021 8.8 (L)  8.9 - 10.3 mg/dL Final   Total Protein 12/01/2021 6.8  6.5 - 8.1 g/dL Final   Albumin 12/01/2021 4.0  3.5 - 5.0 g/dL Final   AST 12/01/2021 26  15 - 41 U/L Final   ALT 12/01/2021 22  0 - 44 U/L Final   Alkaline Phosphatase 12/01/2021 68  38 - 126 U/L Final   Total Bilirubin 12/01/2021 0.6  0.3 - 1.2 mg/dL Final   GFR, Estimated 12/01/2021 >60  >60 mL/min Final   Comment: (NOTE) Calculated using the CKD-EPI Creatinine Equation (2021)    Anion gap 12/01/2021 11  5 - 15 Final   Performed at [facility] [address] [place], [place] [address]   Hgb A1c MFr Bld 12/01/2021 5.3  4.8 - 5.6 % Final   Comment: (NOTE) Pre diabetes:          5.7%-6.4%  Diabetes:              >6.4%  Glycemic control for   <7.0% adults with diabetes    Mean Plasma Glucose 12/01/2021 105.41  mg/dL Final   Performed at [facility], [place] [address] [place], [place] [address]   Alcohol, Ethyl (B) 12/01/2021 107 (H)  <10 mg/dL Final   Comment: (NOTE) Lowest detectable limit for serum alcohol is 10 mg/dL.  For medical purposes only. Performed at [facility], [place] [address] [place], [place] [address]    TSH 12/01/2021 2.185  0.350 - 4.500 uIU/mL Final    Comment: Performed by a 3rd Generation assay with a functional sensitivity of <=0.01 uIU/mL. Performed at [facility], [place] [address] [place], [place] [address]    POC Amphetamine UR 12/01/2021 None Detected  NONE DETECTED (Cut Off Level 1000 ng/mL) Final   POC Secobarbital (BAR) 12/01/2021 None Detected  NONE DETECTED (Cut Off Level 300 ng/mL) Final   POC Buprenorphine (BUP) 12/01/2021 None Detected  NONE DETECTED (Cut Off Level 10 ng/mL) Final   POC Oxazepam (BZO) 12/01/2021 None Detected  NONE DETECTED (Cut Off Level 300 ng/mL) Final   POC Cocaine UR 12/01/2021 None Detected  NONE DETECTED (Cut Off Level 300 ng/mL) Final   POC Methamphetamine UR 12/01/2021 None Detected  NONE DETECTED (Cut Off Level 1000 ng/mL) Final   POC Morphine 12/01/2021 None Detected  NONE DETECTED (Cut Off Level 300 ng/mL) Final   POC Methadone UR 12/01/2021 None Detected  NONE DETECTED (Cut Off Level 300 ng/mL) Final   POC Oxycodone UR 12/01/2021 None Detected  NONE DETECTED (Cut Off Level 100 ng/mL) Final  POC Marijuana UR 12/01/2021 Positive (A)  NONE DETECTED (Cut Off Level 50 ng/mL) Final   SARSCOV2ONAVIRUS 2 AG 12/01/2021 NEGATIVE  NEGATIVE Final   Comment: (NOTE) SARS-CoV-2 antigen NOT DETECTED.   Negative results are presumptive.  Negative results do not preclude SARS-CoV-2 infection and should not be used as the sole basis for treatment or other patient management decisions, including infection  control decisions, particularly in the presence of clinical signs and  symptoms consistent with COVID-19, or in those who have been in contact with the virus.  Negative results must be combined with clinical observations, patient history, and epidemiological information. The expected result is Negative.  Fact Sheet for Patients: HandmadeRecipes.com.cy  Fact Sheet for Healthcare Providers: FuneralLife.at  This test is not yet approved or cleared by the  [place] FDA and  has been authorized for detection and/or diagnosis of SARS-CoV-2 by FDA under an Emergency Use Authorization (EUA).  This EUA will remain in effect (meaning this test can be used) for the duration of  the COV                          ID-19 declaration under Section 564(b)(1) of the Act, 21 U.S.C. section 360bbb-3(b)(1), unless the authorization is terminated or revoked sooner.     Cholesterol 12/01/2021 194  0 - 200 mg/dL Final   Triglycerides 12/01/2021 270 (H)  <150 mg/dL Final   HDL 12/01/2021 75  >40 mg/dL Final   Total CHOL/HDL Ratio 12/01/2021 2.6  RATIO Final   VLDL 12/01/2021 54 (H)  0 - 40 mg/dL Final   LDL Cholesterol 12/01/2021 65  0 - 99 mg/dL Final   Comment:        Total Cholesterol/HDL:CHD Risk Coronary Heart Disease Risk Table                     Men   Women  1/2 Average Risk   3.4   3.3  Average Risk       5.0   4.4  2 X Average Risk   9.6   7.1  3 X Average Risk  23.4   11.0        Use the calculated Patient Ratio above and the CHD Risk Table to determine the patient's CHD Risk.        ATP III CLASSIFICATION (LDL):  <100     mg/dL   Optimal  100-129  mg/dL   Near or Above                    Optimal  130-159  mg/dL   Borderline  160-189  mg/dL   High  >190     mg/dL   Very High Performed at [facility] [address] [place], [place] [address]     Allergies: Patient has no known allergies.  PTA Medications: (Not in a hospital admission)   Long Term Goals: Improvement in symptoms so as ready for discharge  Short Term Goals: Patient will verbalize feelings in meetings with treatment team members., Patient will attend at least of 50% of the groups daily., Pt will complete the PHQ9 on admission, day 3 and discharge., Patient will participate in completing the [place], Patient will score a low risk of violence for 24 hours prior to discharge, and Patient will take medications as prescribed  daily.  Medical Decision Making  Patient remains voluntary.  He will be  admitted to facility based crisis unit for treatment and stabilization.  Current plan includes seeking residential substance use treatment once discharged.  Laboratory studies ordered including CBC, CMP, ethanol, A1c, lipid panel, magnesium, prolactin and TSH.  Urine drug screen order initiated.  EKG ordered.   Current medications: -Acetaminophen 650 mg every 6 as needed/mild pain -Maalox 30 mL oral every 4 as needed/digestion -Hydroxyzine 25 mg 3 times daily as needed/anxiety -Magnesium hydroxide 30 mL daily as needed/mild constipation -Trazodone 50 mg nightly as needed/sleep -Nicotine NicoDerm 14 mg patch transdermal daily/nicotine withdrawal -Sertraline 50 mg daily/mood  CIWA Ativan protocol initiated: -Loperamide 2 to 4 mg oral as needed/diarrhea or loose stools -Lorazepam 1 mg 4 times daily x4 doses, 1 mg 3 times daily x3 doses, 1 mg 2 times daily x2 doses, 1 mg daily x1 dose -Lorazepam 1 mg every 6 hours as needed CIWA greater than 10 -Multivitamin with minerals 1 tablet daily -Ondansetron disintegrating tablet 4 mg every 6 as needed/nausea or vomiting -Thiamine injection 100 mg IM once -Thiamine tablet 100 mg daily     Recommendations  Based on my evaluation the patient does not appear to have an emergency medical condition.  Lucky Rathke, FNP 05/03/22  2:05 PM

## 2022-05-03 NOTE — ED Notes (Signed)
Patient observed/assessed in bedroom on lying in bed awake. Patient's affect is flat. Speech is soft/low and eye contact is appropriate. Patient verbalizes complaints of Nausea, decreased appetite, increased anxiety, and headache. Patient

## 2022-05-03 NOTE — ED Notes (Addendum)
Patient observed/assessed in room in bed appearing in no immediate distress resting peacefully. Q15 minute checks continued by MHT and nursing staff. Will continue to monitor and support.

## 2022-05-03 NOTE — ED Notes (Signed)
Pt transferred from [facility] requesting substance abuse help.Denying SI/HI/AVH. Calm, cooperative throughout interview process. Skin assessment completed. Oriented to unit. Meal and drink offered. At [place], pt continue to deny SI/HI/AVH. Pt verbally contract for safety.  Patients belongings are in locker 11. Patient reported nausea was given Zofran for relief.  Patients had a Strach  on his rt upper thigh. He also has sore on his left foott and leg.Will monitor for safety.

## 2022-05-03 NOTE — BH Assessment (Signed)
Comprehensive Clinical Assessment (CCA) Note  05/03/2022 Brendan Ochoa [id number]  DISPOSITION: Pending NP assessment.   The patient demonstrates the following risk factors for suicide: Chronic risk factors for suicide include: psychiatric disorder of Bipolar d/o and substance use disorder. Acute risk factors for suicide include: unemployment and social withdrawal/isolation. Protective factors for this patient include: hope for the future. Considering these factors, the overall suicide risk at this point appears to be low. Patient is appropriate for outpatient follow up.  Pt is a 46 yo male who presented voluntarily and unaccompanied due to worsening consumption of alcohol. Pt is requesting detox. Pt stated he has been drinking alcohol daily "for a long time" but for the last 2 months he has been drinking more and is up to consuming about a case of beer daily. Pt stated he has Crohn's disease and is having stomach pain from the amount he is drinking. Pt stated he has never had seizures with withdrawal from alcohol but often gets shakes and nausea/vomiting if he is not drinking consistently. Pt stated his stomach is nauseous currently. Pt denied SI or any past attempts to kill himself, HI, NSSH, AVH or paranoia. Pt stated that he also consumes cannabis 2-3 times a week. He stated that he no longer uses cannabis daily as he once did and now only uses it when he is nauseous. Pt stated that he last drank alcohol this morning and last used cannabis yesterday. Pt stated that he sometimes sees "shadows and things" when withdrawing from alcohol. Pt stated he gets "shakey" and often nauseous" which he says upsets his Chron's disease. Pt denied any hx of seizures with withdrawal. Pt reported a hx of blackouts from consuming alcohol. Pt does not have any current OP psychiatric providers but has been previously diagnosed with Bipolar d/o. He stated he is prescribed medication for Bipolar but has not been taking it  for "the last few months." Pt stated he was seeing providers associated with [facility].   Pt stated he is living in a camper on "some friend's land." Pt stated he is unemployed and drinks most of the day. Pt stated that he has never married and has no children and not much support.   Pt is calm, cooperative, alert and seemed oriented. Pt stated he was nauseous and that he was having knee pain when he walks and moves. Pt's speech and movement were slowed and his judgment and insight was fair to poor. Pt's mood seemed depressed and his flat affect was congruent. Pt stated his sleep varies greatly night to night. Pt stated that he is eating less than normal.     Chief Complaint:  Chief Complaint  Patient presents with   Addiction Problem   Visit Diagnosis:  Alcohol Dependence Bipolar d/o    CCA Screening, Triage and Referral (STR)  Patient Reported Information How did you hear about [name]? Self  What Is the Reason for Your Visit/Call Today? Pt is a 46 yo male who presented voluntarily and unaccompanied due to worsening consumption of alcohol. Pt is requesting detox. Pt stated he has been drinking alcohol daily  "for a long time" but for the last 2 months he has been drinking more and is up to consuming about a case of beer daily. Pt stated he has Crohn's disease and is having stomach pain from the amount he is drinking. Pt stated he has never had seizures with withdrawal from alcohol but often gets shakes and nausea/vomiting if he  is not drinking consistently. Pt stated his stomach si nauseous currently. Pt denied SI or any past attempts to kill himself, HI, NSSH, AVH or paranoia. Pt stated that he also consumes cannabis 2-3 times a week. He stated that he no longer uses cannabis daily as he once did and now only uses it when he is nauseous. Pt stated that he last drank alcohol this morning and last used cannabis yesterday. Pt does not have any current OP psychiatric providers  but has been previously diagnosed with Bipolar d/o. He stated he is prescribed medication for Bipoalr but has not been taking it for "the last few months."  How Long Has This Been Causing You Problems? > than 6 months  What Do You Feel Would Help You the Most Today? Alcohol or Drug Use Treatment   Have You Recently Had Any Thoughts About Hurting Yourself? No  Are You Planning to Commit Suicide/Harm Yourself At This time? No   Flowsheet Row ED from 05/03/2022 in [facility] ED from 12/01/2021 in [facility] ED from 07/25/2021 in [place] No Risk No Risk High Risk       Have you Recently Had Thoughts About [place]? No  Are You Planning to Harm Someone at This Time? No  Explanation: No data recorded  Have You Used Any Alcohol or Drugs in the Past 24 Hours? Yes  What Did You Use and How Much? unknow amounts   Do You Currently Have a Therapist/Psychiatrist? No  Name of Therapist/Psychiatrist: Name of Therapist/Psychiatrist: Pt stated that he had been going to see providers at the [place].   Have You Been Recently Discharged From Any Office Practice or Programs? No (none reported)  Explanation of Discharge From Practice/Program: Pt stated he was asked to leave [facility] due to substance use.     CCA Screening Triage Referral Assessment Type of Contact: Face-to-Face  Telemedicine Service Delivery:   Is this Initial or Reassessment?   Date Telepsych consult ordered in [facility]:    Time Telepsych consult ordered in [facility]:    [facility] of Assessment: [facility]  Provider Location: [facility] Services   Collateral Involvement: none   Does Patient Have a [place]? No  Legal Guardian Contact Information: No data recorded Copy of Legal Guardianship Form: No data recorded Legal Guardian  Notified of Arrival: No data recorded Legal Guardian Notified of Pending Discharge: No data recorded If Minor and Not Living with Parent(s), Who has Custody? No data recorded Is CPS involved or ever been involved? Never (none reported)  Is APS involved or ever been involved? Never (none reported)   Patient Determined To Be At Risk for Harm To Self or Others Based on Review of Patient Reported Information or Presenting Complaint? No  Method: No Plan  Availability of Means: No access or NA  Intent: Vague intent or NA  Notification Required: No need or identified person  Additional Information for Danger to Others Potential: No data recorded Additional Comments for Danger to Others Potential: No data recorded Are There Guns or Other Weapons in Your Home? Yes  Types of Guns/Weapons: did not give details  Are These Weapons Safely Secured?                            Yes (did not give details)  Who  Could Verify You Are Able To Have These Secured: no confirmation allowed  Do You Have any Outstanding Charges, Pending Court Dates, Parole/Probation? Pt stated he is currently in the last stages of probation.  Contacted To Inform of Risk of Harm To Self or Others: No data recorded   Does Patient Present under Involuntary Commitment? No    [place] of Residence: [name]   Patient Currently Receiving the Following Services: Not Receiving Services   Determination of Need: Urgent (48 hours)   Options For Referral: Facility-Based Crisis     CCA Biopsychosocial Patient Reported Schizophrenia/Schizoaffective Diagnosis in Past: No   Strengths: Pt is voluntarily seeking treatment for his SA.   Mental Health Symptoms Depression:   Difficulty Concentrating; Hopelessness; Worthlessness; Irritability; Increase/decrease in appetite; Sleep (too much or little) (Despondent, isolation.)   Duration of Depressive symptoms:  Duration of Depressive Symptoms: Greater than two weeks    Mania:   Change in energy/activity; Increased Energy; Overconfidence; Racing thoughts; Recklessness   Anxiety:    Tension; Irritability; Difficulty concentrating; Restlessness   Psychosis:   None (Pt reported VH at times he is in withdrawal from alcohol.)   Duration of Psychotic symptoms:    Trauma:   None   Obsessions:   None   Compulsions:   None   Inattention:   Disorganized; Forgetful; Loses things   Hyperactivity/Impulsivity:   Feeling of restlessness; Fidgets with hands/feet   Oppositional/Defiant Behaviors:   N/A   Emotional Irregularity:   Mood lability   Other Mood/Personality Symptoms:   None noted    Mental Status Exam Appearance and self-care  Stature:   Average   Weight:   Average weight   Clothing:   Casual   Grooming:   Normal   Cosmetic use:   None   Posture/gait:   Normal   Motor activity:   Not Remarkable   Sensorium  Attention:   Normal   Concentration:   Normal   Orientation:   X5   Recall/memory:   Normal   Affect and Mood  Affect:   Depressed; Flat   Mood:   Depressed   Relating  Eye contact:   Normal   Facial expression:   Constricted   Attitude toward examiner:   Cooperative; Guarded   Thought and Language  Speech flow:  Normal; Paucity   Thought content:   Appropriate to Mood and Circumstances   Preoccupation:   None   Hallucinations:   None   Organization:   Coherent   [employer] of Knowledge:   Average   Intelligence:   Average   Abstraction:   Normal   Judgement:   Fair   Art therapist:   Adequate   Insight:   Fair   Decision Making:   Impulsive   Social Functioning  Social Maturity:   Impulsive   Social Judgement:   Heedless   Stress  Stressors:   Other (Comment) (Family.)   Coping Ability:   Overwhelmed; Exhausted   Skill Deficits:   Self-control   Supports:   Family     Religion: Religion/Spirituality Are You A  Religious Person?: Yes What is Your Religious Affiliation?: Catholic How Might This Affect Treatment?: Not assessed  Leisure/Recreation: Leisure / Recreation Do You Have Hobbies?: No Leisure and Hobbies: "not anymore"  Exercise/Diet: Exercise/Diet Do You Exercise?: No Have You Gained or Lost A Significant Amount of Weight in the Past Six Months?: No Do You Follow a Special Diet?: No Do You Have Any Trouble  Sleeping?: Yes Explanation of Sleeping Difficulties: Pt stated his sleep varies greatly night to night.   CCA Employment/Education Employment/Work Situation: Employment / Work Technical sales engineer: Unemployed Has Patient ever Been in Passenger transport manager?: No  Education: Education Is Patient Currently Attending School?: No Last Grade Completed: 11 (some community college) Did Physicist, medical?: Yes What Type of College Degree Do you Have?: none reported Did You Have An Individualized Education Program (IIEP): No Did You Have Any Difficulty At School?: No   CCA Family/Childhood History Family and Relationship History: Family history Marital status: Single Does patient have children?: No  Childhood History:  Childhood History By whom was/is the patient raised?: Both parents (Per chart.) Did patient suffer any verbal/emotional/physical/sexual abuse as a child?: No Has patient ever been sexually abused/assaulted/raped as an adolescent or adult?: No Witnessed domestic violence?: No Has patient been affected by domestic violence as an adult?:  (NA)       CCA Substance Use Alcohol/Drug Use: Alcohol / Drug Use Pain Medications: See MAR Prescriptions: See MAR Over the Counter: See MAR History of alcohol / drug use?: Yes Longest period of sobriety (when/how long): 2 years sober approximately 10 years ago Negative Consequences of Use: Personal relationships, Legal, Financial Withdrawal Symptoms: Agitation, Cramps, Diarrhea, Irritability, Nausea / Vomiting,  Sweats, Tingling Substance #1 Name of Substance 1: alcohol 1 - Age of First Use: teen 1 - Amount (size/oz): a case 1 - Frequency: daily 1 - Duration: ongoing 1 - Last Use / Amount: today-this morning 1 - Method of Aquiring: unknown 1- Route of Use: drink/oral Substance #2 Name of Substance 2: cannabis 2 - Age of First Use: teen 2 - Amount (size/oz): varies 2 - Frequency: 2-3 times a week 2 - Duration: ongoing 2 - Last Use / Amount: yesterday 2 - Method of Aquiring: unknown 2 - Route of Substance Use: smoke                     ASAM's:  Six Dimensions of Multidimensional Assessment  Dimension 1:  Acute Intoxication and/or Withdrawal Potential:   Dimension 1:  Description of individual's past and current experiences of substance use and withdrawal: Pt reports, experiencing cramps, agitation, irritability, nausea, sweating and tingling.  Dimension 2:  Biomedical Conditions and Complications:   Dimension 2:  Description of patient's biomedical conditions and  complications: Pt reports, experiencing withdrawls symptoms.  Dimension 3:  Emotional, Behavioral, or Cognitive Conditions and Complications:  Dimension 3:  Description of emotional, behavioral, or cognitive conditions and complications: Pt reports, depression and anxiety symptoms.  Dimension 4:  Readiness to Change:  Dimension 4:  Description of Readiness to Change criteria: Pt reports, he came back because he ready to change and start recovery.  Dimension 5:  Relapse, Continued use, or Continued Problem Potential:  Dimension 5:  Relapse, continued use, or continued problem potential critiera description: Pt reports, he was he started drinking at the end of May, he picked back up over the past two months (June/July) and drinks everyday this month (July.)  Dimension 6:  Recovery/Living Environment:  Dimension 6:  Recovery/Iiving environment criteria description: Pt reports, he's ready to enter recovery.  ASAM Severity Score:  ASAM's Severity Rating Score: 9  ASAM Recommended Level of Treatment: ASAM Recommended Level of Treatment: Level II Intensive Outpatient Treatment   Substance use Disorder (SUD) Substance Use Disorder (SUD)  Checklist Symptoms of Substance Use: Continued use despite having a persistent/recurrent physical/psychological problem caused/exacerbated by use, Continued use despite persistent  or recurrent social, interpersonal problems, caused or exacerbated by use, Evidence of tolerance  Recommendations for Services/Supports/Treatments: Recommendations for Services/Supports/Treatments Recommendations For Services/Supports/Treatments: Facility Based Crisis  Discharge Disposition:    DSM5 Diagnoses: Patient Active Problem List   Diagnosis Date Noted   Alcohol use disorder, severe, dependence ([place])    Cocaine use disorder, severe, dependence ([place])    Cannabis use disorder, severe, dependence ([place])    Alcohol abuse with withdrawal ([place]) 04/27/2021   Acute encephalopathy    Hypokalemia    Tobacco abuse    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   Epididymal cyst 06/14/2020   Unintentional weight loss 06/14/2020   Abnormal chest x-ray 06/14/2020   Elevated blood pressure reading without diagnosis of hypertension 05/27/2020   Influenza vaccination declined 05/27/2020   AKI (acute kidney injury) ([place]) 01/01/2020   Vomiting 01/01/2020   Cocaine abuse ([place]) 01/01/2020   Alcohol abuse 01/01/2020   Ileitis 02/18/2018   HYPERGLYCEMIA 11/01/2009   Tobacco dependence 07/18/2007   SUBSTANCE ABUSE, MULTIPLE 07/18/2007   ALLERGIC RHINITIS 07/18/2007   ASTHMA 07/18/2007   GERD 07/18/2007   Crohn's disease ([place]) 07/18/2007     Referrals to Alternative Service(s): Referred to Alternative Service(s):   Place:   Date:   Time:    Referred to Alternative Service(s):   Place:    Date:   Time:    Referred to Alternative Service(s):   Place:   Date:   Time:    Referred to Alternative Service(s):   Place:   Date:   Time:     Fuller Mandril, Counselor  [facility] T. Mare Ferrari, [place], [facility], [facility] Triage Specialist [facility]

## 2022-05-03 NOTE — ED Notes (Signed)
Patient is  resting in no acute stress. RR even and unlabored .Environment secured .Will continue to monitor for safely.

## 2022-05-03 NOTE — Progress Notes (Signed)
   05/03/22 1255  [place] (Walk-ins at [facility] only)  How Did You Hear About [name]? Self  What Is the Reason for Your Visit/Call Today? Pt is a 46 yo male who presented voluntarily and unaccompanied due to worsening consumption of alcohol. Pt is requesting detox. Pt stated he has been drinking alcohol daily  "for a long time" but for the last 2 months he has been drinking more and is up to consuming about a case of beer daily. Pt stated he has Crohn's disease and is having stomach pain from the amount he is drinking. Pt stated he has never had seizures with withdrawal from alcohol but often gets shakes and nausea/vomiting if he is not drinking consistently. Pt stated his stomach si nauseous currently. Pt denied SI or any past attempts to kill himself, HI, NSSH, AVH or paranoia. Pt stated that he also consumes cannabis 2-3 times a week. He stated that he no longer uses cannabis daily as he once did and now only uses it when he is nauseous. Pt stated that he last drank alcohol this morning and last used cannabis yesterday. Pt does not have any current OP psychiatric providers but has been previously diagnosed with Bipolar d/o. He stated he is prescribed medication for Bipolr but has not been taking it for "the last few months."  How Long Has This Been Causing You Problems? > than 6 months  Have You Recently Had Any Thoughts About Hurting Yourself? No  Are You Planning to Commit Suicide/Harm Yourself At This time? No  Have you Recently Had Thoughts About [place]? No  Are You Planning To Harm Someone At This Time? No  Are you currently experiencing any auditory, visual or other hallucinations? No (Pt stated that at times he has hallucinations when in withdrawal.)  Have You Used Any Alcohol or Drugs in the Past 24 Hours? Yes  How long ago did you use Drugs or Alcohol? alcohol this morning; cannabis yesterday  What Did You Use and How Much? unknow amounts  Do you have any current medical  co-morbidities that require immediate attention? Yes  Please describe current medical co-morbidities that require immediate attention: Chron's  Clinician description of patient physical appearance/behavior: Pt is calm, cooperative, alert and seemed oriented. Pt stated he was nauseous and that he was having knee pain when he walks and moves. Pt's speech and movement were slowed and his jugdment and insight was fiar to poor.  Pt's mood seemed depressed and his flat affect was congruent.  What Do You Feel Would Help You the Most Today? Alcohol or Drug Use Treatment  If access to [facility] Urgent Care was not available, would you have sought care in the Emergency Department? Yes  Determination of Need Urgent (48 hours)  Options For Referral Facility-Based Crisis   Jamiesha Victoria T. Mare Ferrari, [place], [facility], [facility] Triage Specialist [facility]

## 2022-05-03 NOTE — ED Notes (Addendum)
Patient approached/assessed in patient's room lying in bed awake. Patient's affect is flat and Eye contact is appropriate. Patient verbalized complaints of Nausea, decreased appetite, increased anxiety, sensitivity to light, and headache. Patient expressed that his nausea has improved past last administration of zofran and explains that he has been eating. Patient rated anxiety as a 7/10. He complained of headache pain level 5/10. Patient denied A/V/H, Denies S/I and H/I. Will continue to monitor/support.

## 2022-05-03 NOTE — ED Notes (Signed)
Patient cooperative with admission assessment, denies SI/HI/AVH at present, pt is here for substance abuse treatment.

## 2022-05-04 ENCOUNTER — Encounter (HOSPITAL_COMMUNITY): Payer: Self-pay

## 2022-05-04 DIAGNOSIS — F1020 Alcohol dependence, uncomplicated: Secondary | ICD-10-CM | POA: Diagnosis not present

## 2022-05-04 DIAGNOSIS — Z1152 Encounter for screening for COVID-19: Secondary | ICD-10-CM | POA: Diagnosis not present

## 2022-05-04 LAB — HEMOGLOBIN A1C
Hgb A1c MFr Bld: 5.8 % — ABNORMAL HIGH (ref 4.8–5.6)
Mean Plasma Glucose: 120 mg/dL

## 2022-05-04 LAB — PROLACTIN: Prolactin: 4 ng/mL (ref 3.9–22.7)

## 2022-05-04 MED ORDER — ARIPIPRAZOLE 2 MG PO TABS
2.0000 mg | ORAL_TABLET | Freq: Once | ORAL | Status: AC
  Administered 2022-05-04: 2 mg via ORAL
  Filled 2022-05-04: qty 1

## 2022-05-04 MED ORDER — SERTRALINE HCL 100 MG PO TABS: 100.0000 mg | ORAL_TABLET | Freq: Every day | ORAL | Status: DC

## 2022-05-04 MED ORDER — ARIPIPRAZOLE 5 MG PO TABS: 5.0000 mg | ORAL_TABLET | Freq: Every day | ORAL | Status: DC

## 2022-05-04 MED ORDER — DISULFIRAM 250 MG PO TABS
250.0000 mg | ORAL_TABLET | Freq: Every day | ORAL | Status: DC
  Administered 2022-05-04 – 2022-05-05 (×2): 250 mg via ORAL
  Filled 2022-05-04 (×2): qty 1
  Filled 2022-05-04: qty 7

## 2022-05-04 NOTE — BH IP Treatment Plan (Signed)
Interdisciplinary Treatment and Diagnostic Plan Update  05/04/2022 Time of Session: 9:34AM Brendan Ochoa MRN: [id number]  Diagnosis:  Final diagnoses:  Alcohol use disorder, severe, dependence ([place])     Current Medications:  Current Facility-Administered Medications  Medication Dose Route Frequency Provider Last Rate Last Admin   acetaminophen (TYLENOL) tablet 650 mg  650 mg Oral Q6H PRN Lucky Rathke, FNP       alum & mag hydroxide-simeth (MAALOX/MYLANTA) 200-200-20 MG/5ML suspension 30 mL  30 mL Oral Q4H PRN Lucky Rathke, FNP       hydrOXYzine (ATARAX) tablet 25 mg  25 mg Oral TID PRN Lucky Rathke, FNP       loperamide (IMODIUM) capsule 2-4 mg  2-4 mg Oral PRN Lucky Rathke, FNP       LORazepam (ATIVAN) tablet 1 mg  1 mg Oral Q6H PRN Lucky Rathke, FNP       LORazepam (ATIVAN) tablet 1 mg  1 mg Oral TID Lucky Rathke, FNP       Followed by   Derrill Memo ON 05/05/2022] LORazepam (ATIVAN) tablet 1 mg  1 mg Oral BID Lucky Rathke, FNP       Followed by   Derrill Memo ON 05/07/2022] LORazepam (ATIVAN) tablet 1 mg  1 mg Oral Daily Lucky Rathke, FNP       magnesium hydroxide (MILK OF MAGNESIA) suspension 30 mL  30 mL Oral Daily PRN Lucky Rathke, FNP       multivitamin with minerals tablet 1 tablet  1 tablet Oral Daily Lucky Rathke, FNP   1 tablet at 05/04/22 4782   nicotine (NICODERM CQ - dosed in mg/24 hours) patch 14 mg  14 mg Transdermal Daily Lucky Rathke, FNP   14 mg at 05/04/22 0924   ondansetron (ZOFRAN-ODT) disintegrating tablet 4 mg  4 mg Oral Q6H PRN Lucky Rathke, FNP   4 mg at 05/03/22 1753   sertraline (ZOLOFT) tablet 50 mg  50 mg Oral Daily Lucky Rathke, FNP   50 mg at 05/04/22 9562   thiamine (VITAMIN B1) injection 100 mg  100 mg Intramuscular Once Lucky Rathke, FNP       thiamine (VITAMIN B1) tablet 100 mg  100 mg Oral Daily Lucky Rathke, FNP   100 mg at 05/04/22 1308   traZODone (DESYREL) tablet 50 mg  50 mg Oral QHS PRN Lucky Rathke, FNP   50 mg at 05/03/22 2135    Current Outpatient Medications  Medication Sig Dispense Refill   FLUoxetine (PROZAC) 20 MG capsule Take 20 mg by mouth daily.     Multiple Vitamin (MULTIVITAMIN WITH MINERALS) TABS tablet Take 1 tablet by mouth daily.     albuterol (VENTOLIN HFA) 108 (90 Base) MCG/ACT inhaler Inhale 2 puffs into the lungs every 6 (six) hours as needed for wheezing or shortness of breath. 8 g 1   PTA Medications: Prior to Admission medications   Medication Sig Start Date End Date Taking? Authorizing Provider  FLUoxetine (PROZAC) 20 MG capsule Take 20 mg by mouth daily. 02/04/22  Yes [provider]  Multiple Vitamin (MULTIVITAMIN WITH MINERALS) TABS tablet Take 1 tablet by mouth daily.   Yes [provider]  albuterol (VENTOLIN HFA) 108 (90 Base) MCG/ACT inhaler Inhale 2 puffs into the lungs every 6 (six) hours as needed for wheezing or shortness of breath. 07/28/21   Ival Bible, MD    Patient Stressors: Substance abuse  Other: Housing situation    Patient Strengths: Ability for insight  Active sense of humor  Average or above average intelligence  Capable of independent living  Engineer, drilling fund of knowledge  Motivation for treatment/growth  Work skills   Treatment Modalities: Medication Management, Group therapy, Case management,  1 to 1 session with clinician, Psychoeducation, Recreational therapy.   Physician Treatment Plan for Primary and Secondary Diagnosis:  Final diagnoses:  Alcohol use disorder, severe, dependence ([place])   Long Term Goal(s): Improvement in symptoms so as ready for discharge  Short Term Goals: Patient will verbalize feelings in meetings with treatment team members. Patient will attend at least of 50% of the groups daily. Pt will complete the PHQ9 on admission, day 3 and discharge. Patient will participate in completing the [place] Patient will score a low risk of violence for  24 hours prior to discharge Patient will take medications as prescribed daily.  Medication Management: Evaluate patient's response, side effects, and tolerance of medication regimen.  Therapeutic Interventions: 1 to 1 sessions, Unit Group sessions and Medication administration.  Evaluation of Outcomes: Progressing  LCSW Treatment Plan for Primary Diagnosis:  Final diagnoses:  Alcohol use disorder, severe, dependence ([place])    Long Term Goal(s): Safe transition to appropriate next level of care at discharge.  Short Term Goals: Facilitate acceptance of mental health diagnosis and concerns through verbal commitment to aftercare plan and appointments at discharge., Patient will identify one social support prior to discharge to aid in patient's recovery., Patient will attend AA/NA groups as scheduled., Identify minimum of 2 triggers associated with mental health/substance abuse issues with treatment team members., and Increase skills for wellness and recovery by attending 50% of scheduled groups.  Therapeutic Interventions: Assess for all discharge needs, 1 to 1 time with Education officer, museum, Explore available resources and support systems, Assess for adequacy in community support network, Educate family and significant other(s) on suicide prevention, Complete Psychosocial Assessment, Interpersonal group therapy.  Evaluation of Outcomes: Progressing   Progress in Treatment: Attending groups: No. Participating in groups: No. Taking medication as prescribed: Yes. Toleration medication: Yes. Family/Significant other contact made: Yes, individual(s) contacted:  Patient provided LCSW with permission to contact his mother as needed. Jess Toney [phone] Patient understands diagnosis: Yes. Discussing patient identified problems/goals with staff: Yes. Medical problems stabilized or resolved: Yes. Denies suicidal/homicidal ideation: Yes. Issues/concerns per patient self-inventory: Yes. Other:  alcohol use and housing situation   New problem(s) identified: No, Describe:  other than reported on admission  New Short Term/Long Term Goal(s): Safe transition to appropriate next level of care at discharge, Engage patient in therapeutic group addressing interpersonal concerns. Engage patient in aftercare planning with referrals and resources, Increase ability to appropriately verbalize feelings, Facilitate acceptance of mental health diagnosis and concerns and Identify triggers associated with mental health/substance abuse issues.    Patient Goals:  Patient expressed an interest in resources for the [facility] in the surrounding areas.   Discharge Plan or Barriers: LCSW to provide list as requested.   Reason for Continuation of Hospitalization: Anxiety Medication stabilization Withdrawal symptoms  Estimated Length of Stay: 3-5 days  Last 3 [place] Suicide Severity Risk Score: [place] ED from 05/03/2022 in [facility] from 12/01/2021 in [facility] ED from 07/25/2021 in [place] No Risk No Risk High Risk       Last [facility] 2/9  Scores:    05/03/2022    1:25 PM 12/01/2021    9:53 PM 07/25/2021    2:41 PM  Depression screen PHQ 2/9  Decreased Interest 2 1 1   Down, Depressed, Hopeless 2 1 1   PHQ - 2 Score 4 2 2   Altered sleeping 3 3 1   Tired, decreased energy 2 3 1   Change in appetite 3 3 1   Feeling bad or failure about yourself  2 3 1   Trouble concentrating 2 2 1   Moving slowly or fidgety/restless 2 3 1   Suicidal thoughts 2 0 1  PHQ-9 Score 20 19 9   Difficult doing work/chores Very difficult Very difficult Somewhat difficult    Scribe for Treatment Team: Gigi Gin 05/04/2022 10:56 AM

## 2022-05-04 NOTE — ED Notes (Signed)
Snacks given

## 2022-05-04 NOTE — Tx Team (Signed)
LCSW met with patient to assess current mood, affect, physical state, and inquire about needs/goals while here in [facility] and after discharge. Patient reports he presented due to needing to detox from alcohol. Patient reports he recently graduated from [employer] in April 2023, and reports 3 months of sobriety before returning to alcohol. Patient reports "getting bored with sobriety" which ultimately results back to using alcohol. Patient reports this has been his life for the past decade, and he knows that he needs to make a change for himself. Patient reports he is currently receiving intensive outpatient treatment via [facility] in [place], where he is seen three times a week. Patient reports he also participates in [place] meetings as able. Patient reports his current goal is to get on medication for his intense anxiety and depression. Patient reports when sober he sometimes has these "panic attacks that almost feels as if he is about to have a heart attack". Patient denies any SI/HI/AH. Patient reports when he is highly intoxicated he will at times see dark shadow that look like demons. Patient was advised to speak with the MD regarding these concerns so that it could be properly addressed. Patient reports his current goal is to explore options for [facility] in the surrounding area, as he has things he needs to take care of before thinking about residential placement. Patient reports he has an upcoming court date on January 25th and is currently working with a Engineer, manufacturing systems by the name is Mrs. Archie to get things handled with the court. LCSW expressed understanding, and will provide list of [facility] in the area. Patient also reports he will resume care with his current outpatient provider at discharge. No other needs were reported at this time by patient.   Brendan Conn, LCSW Clinical Social Worker [facility] Ph: [phone]

## 2022-05-04 NOTE — ED Notes (Signed)
In peer group meeting

## 2022-05-04 NOTE — ED Notes (Signed)
Pt asleep in bed. Respirations even and unlabored. Monitoring for safety.

## 2022-05-04 NOTE — Group Note (Signed)
Group Topic: Change and Accountability  Group Date: 05/04/2022 Start Time: [place] End Time: [place] Facilitators: Laury Axon E  Department: [facility]  Number of Participants: 4  Group Focus: activities of daily living skills, goals/reality orientation, and self-esteem Treatment Modality:  Individual Therapy and Solution-Focused Therapy Interventions utilized were other Motivational Speech and patient education Purpose: enhance coping skills, regain self-worth, and reinforce self-care  Name: Brendan Ochoa Date of Birth: 1975-10-30  MR: [id number]    Level of Participation: active Quality of Participation: attentive Interactions with others: gave feedback Mood/Affect: appropriate Triggers (if applicable): n/a Cognition: coherent/clear Progress: Moderate Response: n/a Plan: follow-up needed  Patients Problems:  Patient Active Problem List   Diagnosis Date Noted   Alcohol use disorder, severe, dependence ([place])    Cocaine use disorder, severe, dependence ([place])    Cannabis use disorder, severe, dependence ([place])    Alcohol abuse with withdrawal ([place]) 04/27/2021   Acute encephalopathy    Hypokalemia    Tobacco abuse    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   Epididymal cyst 06/14/2020   Unintentional weight loss 06/14/2020   Abnormal chest x-ray 06/14/2020   Elevated blood pressure reading without diagnosis of hypertension 05/27/2020   Influenza vaccination declined 05/27/2020   AKI (acute kidney injury) ([place]) 01/01/2020   Vomiting 01/01/2020   Cocaine abuse ([place]) 01/01/2020   Alcohol abuse 01/01/2020   Ileitis 02/18/2018   HYPERGLYCEMIA 11/01/2009   Tobacco dependence 07/18/2007   SUBSTANCE ABUSE, MULTIPLE 07/18/2007   ALLERGIC RHINITIS 07/18/2007   ASTHMA 07/18/2007   GERD 07/18/2007   Crohn's disease ([place])  07/18/2007

## 2022-05-04 NOTE — ED Provider Notes (Signed)
Behavioral Health Progress Note  Date and Time: 05/04/2022 7:31 AM Name: Brendan Ochoa MRN:  [id number]  Subjective:  Patient reports ongoing alcohol use for a long time. Reports that he loves the way that alcohol makes him feel - more energetic, confident. When he is sober, he starts feeling guilty about himself, ruminating about his past, feels "miserable" about his life, health. Reports anhedonia with activities that he used to enjoy - hunting and fishing. Reports when he doesn't have alcohol, he will also start having obsessive thoughts in his head and feeling very anxious. He denies SI/HI. He reports AVH in the context of drinking binges but otherwise none. Reports there are times when he will get anxious even about a pen. He reports that he lives on a camper on his friend's land and if he goes back to the camper, he will likely use alcohol. He is interested in [employer]. Reports fair appetite.   Diagnosis:  Final diagnoses:  Alcohol use disorder, severe, dependence ([place])    Total Time spent with patient: 1 hour  Past Psychiatric History: stimulant-induced psychotic disorder, alcohol use disorder, cannabis use, cocaine use disorder   Past Medical History:  Past Medical History:  Diagnosis Date   Asthma    Cocaine abuse ([place])    in past   Crohn disease ([place]) 2008   Depression    in past, not current as of 2019    ETOH abuse    history of alcohol use   GERD (gastroesophageal reflux disease)     Past Surgical History:  Procedure Laterality Date   BIOPSY  10/15/2017   Procedure: BIOPSY;  Surgeon: Danie Binder, MD;  Location: [facility];  Service: Endoscopy;;  terminal ileum colon   COLONOSCOPY WITH PROPOFOL N/A 10/15/2017   redundant colon, normal rectum, external and internal hemorrhoids, quiescent crohn's. Pathology: inactive chronicn nonspecific ileitis   ESOPHAGOGASTRODUODENOSCOPY (EGD) WITH PROPOFOL N/A 10/15/2017   normal esophagus, small hiatal hernia, gastritis,  duodenitis   FINGER AMPUTATION Right 2012   cut finger table saw and surgically amputated   Family History:  Family History  Problem Relation Age of Onset   Stomach cancer Maternal Grandfather    Colon cancer Neg Hx    Family Psychiatric  History: alcohol use disorder, multiple family members diagnosed with alcohol and substance use disorders   Social History:  Social History   Substance and Sexual Activity  Alcohol Use Not Currently   Comment: alcoholic- last use 7/82/95     Social History   Substance and Sexual Activity  Drug Use Not Currently   Types: Cocaine, Marijuana, Methamphetamines   Comment: last cocaine use 10/10/20.  last meth april 2022    Social History   Socioeconomic History   Marital status: Single    Spouse name: Not on file   Number of children: Not on file   Years of education: Not on file   Highest education level: Not on file  Occupational History   Not on file  Tobacco Use   Smoking status: Every Day    Packs/day: 0.50    Years: 15.00    Total pack years: 7.50    Types: Cigarettes   Smokeless tobacco: Never   Tobacco comments:    1/4 to 1/2 pack per day   Vaping Use   Vaping Use: Never used  Substance and Sexual Activity   Alcohol use: Not Currently    Comment: alcoholic- last use 11/01/28   Drug use: Not Currently  Types: Cocaine, Marijuana, Methamphetamines    Comment: last cocaine use 10/10/20.  last meth april 2022   Sexual activity: Not on file  Other Topics Concern   Not on file  Social History Narrative   Not on file   Social Determinants of Health   Financial Resource Strain: Not on file  Food Insecurity: Not on file  Transportation Needs: Not on file  Physical Activity: Not on file  Stress: Not on file  Social Connections: Not on file   SDOH:  SDOH Screenings   Depression (PHQ2-9): High Risk (05/03/2022)  Tobacco Use: High Risk (04/27/2021)   Additional Social History:    Pain Medications: See  MAR Prescriptions: See MAR Over the Counter: See MAR History of alcohol / drug use?: Yes Longest period of sobriety (when/how long): 2 years sober approximately 10 years ago Negative Consequences of Use: Personal relationships, Legal, Financial Withdrawal Symptoms: Agitation, Cramps, Diarrhea, Irritability, Nausea / Vomiting, Sweats, Tingling Name of Substance 1: alcohol 1 - Age of First Use: teen 1 - Amount (size/oz): a case 1 - Frequency: daily 1 - Duration: ongoing 1 - Last Use / Amount: today-this morning 1 - Method of Aquiring: unknown 1- Route of Use: drink/oral Name of Substance 2: cannabis 2 - Age of First Use: teen 2 - Amount (size/oz): varies 2 - Frequency: 2-3 times a week 2 - Duration: ongoing 2 - Last Use / Amount: yesterday 2 - Method of Aquiring: unknown 2 - Route of Substance Use: smoke                Sleep: Fair  Appetite:  Fair  Current Medications:  Current Facility-Administered Medications  Medication Dose Route Frequency Provider Last Rate Last Admin   acetaminophen (TYLENOL) tablet 650 mg  650 mg Oral Q6H PRN Lucky Rathke, FNP       alum & mag hydroxide-simeth (MAALOX/MYLANTA) 200-200-20 MG/5ML suspension 30 mL  30 mL Oral Q4H PRN Lucky Rathke, FNP       hydrOXYzine (ATARAX) tablet 25 mg  25 mg Oral TID PRN Lucky Rathke, FNP       loperamide (IMODIUM) capsule 2-4 mg  2-4 mg Oral PRN Lucky Rathke, FNP       LORazepam (ATIVAN) tablet 1 mg  1 mg Oral Q6H PRN Lucky Rathke, FNP       LORazepam (ATIVAN) tablet 1 mg  1 mg Oral QID Lucky Rathke, FNP   1 mg at 05/03/22 2135   Followed by   LORazepam (ATIVAN) tablet 1 mg  1 mg Oral TID Lucky Rathke, FNP       Followed by   Derrill Memo ON 05/05/2022] LORazepam (ATIVAN) tablet 1 mg  1 mg Oral BID Lucky Rathke, FNP       Followed by   Derrill Memo ON 05/07/2022] LORazepam (ATIVAN) tablet 1 mg  1 mg Oral Daily Lucky Rathke, FNP       magnesium hydroxide (MILK OF MAGNESIA) suspension 30 mL  30 mL Oral Daily PRN  Lucky Rathke, FNP       multivitamin with minerals tablet 1 tablet  1 tablet Oral Daily Lucky Rathke, FNP   1 tablet at 05/03/22 1507   nicotine (NICODERM CQ - dosed in mg/24 hours) patch 14 mg  14 mg Transdermal Daily Lucky Rathke, FNP   14 mg at 05/03/22 1507   ondansetron (ZOFRAN-ODT) disintegrating tablet 4 mg  4 mg Oral Q6H PRN Lucky Rathke,  FNP   4 mg at 05/03/22 1753   sertraline (ZOLOFT) tablet 50 mg  50 mg Oral Daily Lucky Rathke, FNP       thiamine (VITAMIN B1) injection 100 mg  100 mg Intramuscular Once Lucky Rathke, FNP       thiamine (VITAMIN B1) tablet 100 mg  100 mg Oral Daily Lucky Rathke, FNP       traZODone (DESYREL) tablet 50 mg  50 mg Oral QHS PRN Lucky Rathke, FNP   50 mg at 05/03/22 2135   Current Outpatient Medications  Medication Sig Dispense Refill   FLUoxetine (PROZAC) 20 MG capsule Take 20 mg by mouth daily.     Multiple Vitamin (MULTIVITAMIN WITH MINERALS) TABS tablet Take 1 tablet by mouth daily.     albuterol (VENTOLIN HFA) 108 (90 Base) MCG/ACT inhaler Inhale 2 puffs into the lungs every 6 (six) hours as needed for wheezing or shortness of breath. 8 g 1    Labs  Lab Results:  Admission on 05/03/2022  Component Date Value Ref Range Status   SARS Coronavirus 2 by RT PCR 05/03/2022 NEGATIVE  NEGATIVE Final   Comment: (NOTE) SARS-CoV-2 target nucleic acids are NOT DETECTED.  The SARS-CoV-2 RNA is generally detectable in upper respiratory specimens during the acute phase of infection. The lowest concentration of SARS-CoV-2 viral copies this assay can detect is 138 copies/mL. A negative result does not preclude SARS-Cov-2 infection and should not be used as the sole basis for treatment or other patient management decisions. A negative result may occur with  improper specimen collection/handling, submission of specimen other than nasopharyngeal swab, presence of viral mutation(s) within the areas targeted by this assay, and inadequate number of  viral copies(<138 copies/mL). A negative result must be combined with clinical observations, patient history, and epidemiological information. The expected result is Negative.  Fact Sheet for Patients:  EntrepreneurPulse.com.au  Fact Sheet for Healthcare Providers:  IncredibleEmployment.be  This test is no                          t yet approved or cleared by the [place] FDA and  has been authorized for detection and/or diagnosis of SARS-CoV-2 by FDA under an Emergency Use Authorization (EUA). This EUA will remain  in effect (meaning this test can be used) for the duration of the COVID-19 declaration under Section 564(b)(1) of the Act, 21 U.S.C.section 360bbb-3(b)(1), unless the authorization is terminated  or revoked sooner.       Influenza A by PCR 05/03/2022 NEGATIVE  NEGATIVE Final   Influenza B by PCR 05/03/2022 NEGATIVE  NEGATIVE Final   Comment: (NOTE) The Xpert Xpress SARS-CoV-2/FLU/RSV plus assay is intended as an aid in the diagnosis of influenza from Nasopharyngeal swab specimens and should not be used as a sole basis for treatment. Nasal washings and aspirates are unacceptable for Xpert Xpress SARS-CoV-2/FLU/RSV testing.  Fact Sheet for Patients: EntrepreneurPulse.com.au  Fact Sheet for Healthcare Providers: IncredibleEmployment.be  This test is not yet approved or cleared by the [place] FDA and has been authorized for detection and/or diagnosis of SARS-CoV-2 by FDA under an Emergency Use Authorization (EUA). This EUA will remain in effect (meaning this test can be used) for the duration of the COVID-19 declaration under Section 564(b)(1) of the Act, 21 U.S.C. section 360bbb-3(b)(1), unless the authorization is terminated or revoked.     Resp Syncytial Virus by PCR 05/03/2022 NEGATIVE  NEGATIVE Final  Comment: (NOTE) Fact Sheet for  Patients: EntrepreneurPulse.com.au  Fact Sheet for Healthcare Providers: IncredibleEmployment.be  This test is not yet approved or cleared by the [place] FDA and has been authorized for detection and/or diagnosis of SARS-CoV-2 by FDA under an Emergency Use Authorization (EUA). This EUA will remain in effect (meaning this test can be used) for the duration of the COVID-19 declaration under Section 564(b)(1) of the Act, 21 U.S.C. section 360bbb-3(b)(1), unless the authorization is terminated or revoked.  Performed at [facility], [place] [address] [place], [place] [address]    WBC 05/03/2022 7.5  4.0 - 10.5 K/uL Final   RBC 05/03/2022 5.55  4.22 - 5.81 MIL/uL Final   Hemoglobin 05/03/2022 17.3 (H)  13.0 - 17.0 g/dL Final   HCT 05/03/2022 48.9  39.0 - 52.0 % Final   MCV 05/03/2022 88.1  80.0 - 100.0 fL Final   MCH 05/03/2022 31.2  26.0 - 34.0 pg Final   MCHC 05/03/2022 35.4  30.0 - 36.0 g/dL Final   RDW 05/03/2022 13.3  11.5 - 15.5 % Final   Platelets 05/03/2022 292  150 - 400 K/uL Final   nRBC 05/03/2022 0.0  0.0 - 0.2 % Final   Neutrophils Relative % 05/03/2022 54  % Final   Neutro Abs 05/03/2022 4.1  1.7 - 7.7 K/uL Final   Lymphocytes Relative 05/03/2022 37  % Final   Lymphs Abs 05/03/2022 2.8  0.7 - 4.0 K/uL Final   Monocytes Relative 05/03/2022 6  % Final   Monocytes Absolute 05/03/2022 0.5  0.1 - 1.0 K/uL Final   Eosinophils Relative 05/03/2022 2  % Final   Eosinophils Absolute 05/03/2022 0.1  0.0 - 0.5 K/uL Final   Basophils Relative 05/03/2022 1  % Final   Basophils Absolute 05/03/2022 0.1  0.0 - 0.1 K/uL Final   Immature Granulocytes 05/03/2022 0  % Final   Abs Immature Granulocytes 05/03/2022 0.01  0.00 - 0.07 K/uL Final   Performed at [facility], [place] [address], [place], [place] [address]   Sodium 05/03/2022 136  135 - 145 mmol/L Final   Potassium 05/03/2022 3.9  3.5 - 5.1 mmol/L Final   Chloride 05/03/2022 102   98 - 111 mmol/L Final   CO2 05/03/2022 22  22 - 32 mmol/L Final   Glucose, Bld 05/03/2022 109 (H)  70 - 99 mg/dL Final   Glucose reference range applies only to samples taken after fasting for at least 8 hours.   BUN 05/03/2022 13  6 - 20 mg/dL Final   Creatinine, Ser 05/03/2022 0.94  0.61 - 1.24 mg/dL Final   Calcium 05/03/2022 9.1  8.9 - 10.3 mg/dL Final   Total Protein 05/03/2022 8.1  6.5 - 8.1 g/dL Final   Albumin 05/03/2022 4.4  3.5 - 5.0 g/dL Final   AST 05/03/2022 22  15 - 41 U/L Final   ALT 05/03/2022 14  0 - 44 U/L Final   Alkaline Phosphatase 05/03/2022 74  38 - 126 U/L Final   Total Bilirubin 05/03/2022 0.4  0.3 - 1.2 mg/dL Final   GFR, Estimated 05/03/2022 >60  >60 mL/min Final   Comment: (NOTE) Calculated using the CKD-EPI Creatinine Equation (2021)    Anion gap 05/03/2022 12  5 - 15 Final   Performed at [facility] [address], [place], [place] [address]   Hgb A1c MFr Bld 05/03/2022 5.8 (H)  4.8 - 5.6 % Final   Comment: (NOTE)  Prediabetes: 5.7 - 6.4         Diabetes: >6.4         Glycemic control for adults with diabetes: <7.0    Mean Plasma Glucose 05/03/2022 120  mg/dL Final   Comment: (NOTE) Performed At: [facility] [place], [place] [id number] Rush Farmer MD IP:[id number]    Magnesium 05/03/2022 2.3  1.7 - 2.4 mg/dL Final   Performed at [facility], [place] [address] [place], [place] [address]   Alcohol, Ethyl (B) 05/03/2022 211 (H)  <10 mg/dL Final   Comment: (NOTE) Lowest detectable limit for serum alcohol is 10 mg/dL.  For medical purposes only. Performed at [facility], [place] [address] [place], [place] [address]    Cholesterol 05/03/2022 245 (H)  0 - 200 mg/dL Final   Triglycerides 05/03/2022 331 (H)  <150 mg/dL Final   HDL 05/03/2022 104  >40 mg/dL Final   Total CHOL/HDL Ratio 05/03/2022 2.4  RATIO Final   VLDL 05/03/2022 66 (H)  0 - 40 mg/dL Final   LDL Cholesterol 05/03/2022 75  0 - 99  mg/dL Final   Comment:        Total Cholesterol/HDL:CHD Risk Coronary Heart Disease Risk Table                     Men   Women  1/2 Average Risk   3.4   3.3  Average Risk       5.0   4.4  2 X Average Risk   9.6   7.1  3 X Average Risk  23.4   11.0        Use the calculated Patient Ratio above and the CHD Risk Table to determine the patient's CHD Risk.        ATP III CLASSIFICATION (LDL):  <100     mg/dL   Optimal  100-129  mg/dL   Near or Above                    Optimal  130-159  mg/dL   Borderline  160-189  mg/dL   High  >190     mg/dL   Very High Performed at [facility] [address], [place], [place] [address]    TSH 05/03/2022 0.940  0.350 - 4.500 uIU/mL Final   Comment: Performed by a 3rd Generation assay with a functional sensitivity of <=0.01 uIU/mL. Performed at [facility], [place] [address], [place], [place] [address]    POC Amphetamine UR 05/03/2022 None Detected  NONE DETECTED (Cut Off Level 1000 ng/mL) Final   POC Secobarbital (BAR) 05/03/2022 None Detected  NONE DETECTED (Cut Off Level 300 ng/mL) Final   POC Buprenorphine (BUP) 05/03/2022 None Detected  NONE DETECTED (Cut Off Level 10 ng/mL) Final   POC Oxazepam (BZO) 05/03/2022 None Detected  NONE DETECTED (Cut Off Level 300 ng/mL) Final   POC Cocaine UR 05/03/2022 None Detected  NONE DETECTED (Cut Off Level 300 ng/mL) Final   POC Methamphetamine UR 05/03/2022 None Detected  NONE DETECTED (Cut Off Level 1000 ng/mL) Final   POC Morphine 05/03/2022 None Detected  NONE DETECTED (Cut Off Level 300 ng/mL) Final   POC Methadone UR 05/03/2022 None Detected  NONE DETECTED (Cut Off Level 300 ng/mL) Final   POC Oxycodone UR 05/03/2022 None Detected  NONE DETECTED (Cut Off Level 100 ng/mL) Final   POC Marijuana UR 05/03/2022 Positive (A)  NONE DETECTED (Cut  Off Level 50 ng/mL) Final   SARSCOV2ONAVIRUS 2 AG 05/03/2022 NEGATIVE  NEGATIVE Final   Comment: (NOTE) SARS-CoV-2 antigen NOT DETECTED.   Negative  results are presumptive.  Negative results do not preclude SARS-CoV-2 infection and should not be used as the sole basis for treatment or other patient management decisions, including infection  control decisions, particularly in the presence of clinical signs and  symptoms consistent with COVID-19, or in those who have been in contact with the virus.  Negative results must be combined with clinical observations, patient history, and epidemiological information. The expected result is Negative.  Fact Sheet for Patients: HandmadeRecipes.com.cy  Fact Sheet for Healthcare Providers: FuneralLife.at  This test is not yet approved or cleared by the [place] FDA and  has been authorized for detection and/or diagnosis of SARS-CoV-2 by FDA under an Emergency Use Authorization (EUA).  This EUA will remain in effect (meaning this test can be used) for the duration of  the COV                          ID-19 declaration under Section 564(b)(1) of the Act, 21 U.S.C. section 360bbb-3(b)(1), unless the authorization is terminated or revoked sooner.    Admission on 12/01/2021, Discharged on 12/02/2021  Component Date Value Ref Range Status   SARS Coronavirus 2 by RT PCR 12/01/2021 NEGATIVE  NEGATIVE Final   Comment: (NOTE) SARS-CoV-2 target nucleic acids are NOT DETECTED.  The SARS-CoV-2 RNA is generally detectable in upper respiratory specimens during the acute phase of infection. The lowest concentration of SARS-CoV-2 viral copies this assay can detect is 138 copies/mL. A negative result does not preclude SARS-Cov-2 infection and should not be used as the sole basis for treatment or other patient management decisions. A negative result may occur with  improper specimen collection/handling, submission of specimen other than nasopharyngeal swab, presence of viral mutation(s) within the areas targeted by this assay, and inadequate number of  viral copies(<138 copies/mL). A negative result must be combined with clinical observations, patient history, and epidemiological information. The expected result is Negative.  Fact Sheet for Patients:  EntrepreneurPulse.com.au  Fact Sheet for Healthcare Providers:  IncredibleEmployment.be  This test is no                          t yet approved or cleared by the [place] FDA and  has been authorized for detection and/or diagnosis of SARS-CoV-2 by FDA under an Emergency Use Authorization (EUA). This EUA will remain  in effect (meaning this test can be used) for the duration of the COVID-19 declaration under Section 564(b)(1) of the Act, 21 U.S.C.section 360bbb-3(b)(1), unless the authorization is terminated  or revoked sooner.       Influenza A by PCR 12/01/2021 NEGATIVE  NEGATIVE Final   Influenza B by PCR 12/01/2021 NEGATIVE  NEGATIVE Final   Comment: (NOTE) The Xpert Xpress SARS-CoV-2/FLU/RSV plus assay is intended as an aid in the diagnosis of influenza from Nasopharyngeal swab specimens and should not be used as a sole basis for treatment. Nasal washings and aspirates are unacceptable for Xpert Xpress SARS-CoV-2/FLU/RSV testing.  Fact Sheet for Patients: EntrepreneurPulse.com.au  Fact Sheet for Healthcare Providers: IncredibleEmployment.be  This test is not yet approved or cleared by the [place] FDA and has been authorized for detection and/or diagnosis of SARS-CoV-2 by FDA under an Emergency Use Authorization (EUA). This EUA will remain in effect (meaning this  test can be used) for the duration of the COVID-19 declaration under Section 564(b)(1) of the Act, 21 U.S.C. section 360bbb-3(b)(1), unless the authorization is terminated or revoked.  Performed at [facility], [place] [address] [place], [place] [address]    WBC 12/01/2021 5.2  4.0 - 10.5 K/uL Final   RBC 12/01/2021 4.86   4.22 - 5.81 MIL/uL Final   Hemoglobin 12/01/2021 14.8  13.0 - 17.0 g/dL Final   HCT 12/01/2021 43.5  39.0 - 52.0 % Final   MCV 12/01/2021 89.5  80.0 - 100.0 fL Final   MCH 12/01/2021 30.5  26.0 - 34.0 pg Final   MCHC 12/01/2021 34.0  30.0 - 36.0 g/dL Final   RDW 12/01/2021 14.1  11.5 - 15.5 % Final   Platelets 12/01/2021 243  150 - 400 K/uL Final   nRBC 12/01/2021 0.0  0.0 - 0.2 % Final   Neutrophils Relative % 12/01/2021 43  % Final   Neutro Abs 12/01/2021 2.3  1.7 - 7.7 K/uL Final   Lymphocytes Relative 12/01/2021 46  % Final   Lymphs Abs 12/01/2021 2.4  0.7 - 4.0 K/uL Final   Monocytes Relative 12/01/2021 8  % Final   Monocytes Absolute 12/01/2021 0.4  0.1 - 1.0 K/uL Final   Eosinophils Relative 12/01/2021 2  % Final   Eosinophils Absolute 12/01/2021 0.1  0.0 - 0.5 K/uL Final   Basophils Relative 12/01/2021 1  % Final   Basophils Absolute 12/01/2021 0.0  0.0 - 0.1 K/uL Final   Immature Granulocytes 12/01/2021 0  % Final   Abs Immature Granulocytes 12/01/2021 0.01  0.00 - 0.07 K/uL Final   Performed at [facility], [place] [address], [place], [place] [address]   Sodium 12/01/2021 140  135 - 145 mmol/L Final   Potassium 12/01/2021 3.8  3.5 - 5.1 mmol/L Final   Chloride 12/01/2021 106  98 - 111 mmol/L Final   CO2 12/01/2021 23  22 - 32 mmol/L Final   Glucose, Bld 12/01/2021 94  70 - 99 mg/dL Final   Glucose reference range applies only to samples taken after fasting for at least 8 hours.   BUN 12/01/2021 16  6 - 20 mg/dL Final   Creatinine, Ser 12/01/2021 0.87  0.61 - 1.24 mg/dL Final   Calcium 12/01/2021 8.8 (L)  8.9 - 10.3 mg/dL Final   Total Protein 12/01/2021 6.8  6.5 - 8.1 g/dL Final   Albumin 12/01/2021 4.0  3.5 - 5.0 g/dL Final   AST 12/01/2021 26  15 - 41 U/L Final   ALT 12/01/2021 22  0 - 44 U/L Final   Alkaline Phosphatase 12/01/2021 68  38 - 126 U/L Final   Total Bilirubin 12/01/2021 0.6  0.3 - 1.2 mg/dL Final   GFR, Estimated 12/01/2021 >60  >60 mL/min Final    Comment: (NOTE) Calculated using the CKD-EPI Creatinine Equation (2021)    Anion gap 12/01/2021 11  5 - 15 Final   Performed at [facility] [address] [place], [place] [address]   Hgb A1c MFr Bld 12/01/2021 5.3  4.8 - 5.6 % Final   Comment: (NOTE) Pre diabetes:          5.7%-6.4%  Diabetes:              >6.4%  Glycemic control for   <7.0% adults with diabetes    Mean Plasma Glucose 12/01/2021 105.41  mg/dL Final   Performed at [facility], [place]  [address], [place], [place] [address]   Alcohol, Ethyl (B) 12/01/2021 107 (H)  <10 mg/dL Final   Comment: (NOTE) Lowest detectable limit for serum alcohol is 10 mg/dL.  For medical purposes only. Performed at [facility], [place] [address], [place], [place] [address]    TSH 12/01/2021 2.185  0.350 - 4.500 uIU/mL Final   Comment: Performed by a 3rd Generation assay with a functional sensitivity of <=0.01 uIU/mL. Performed at [facility], [place] [address], [place], [place] [address]    POC Amphetamine UR 12/01/2021 None Detected  NONE DETECTED (Cut Off Level 1000 ng/mL) Final   POC Secobarbital (BAR) 12/01/2021 None Detected  NONE DETECTED (Cut Off Level 300 ng/mL) Final   POC Buprenorphine (BUP) 12/01/2021 None Detected  NONE DETECTED (Cut Off Level 10 ng/mL) Final   POC Oxazepam (BZO) 12/01/2021 None Detected  NONE DETECTED (Cut Off Level 300 ng/mL) Final   POC Cocaine UR 12/01/2021 None Detected  NONE DETECTED (Cut Off Level 300 ng/mL) Final   POC Methamphetamine UR 12/01/2021 None Detected  NONE DETECTED (Cut Off Level 1000 ng/mL) Final   POC Morphine 12/01/2021 None Detected  NONE DETECTED (Cut Off Level 300 ng/mL) Final   POC Methadone UR 12/01/2021 None Detected  NONE DETECTED (Cut Off Level 300 ng/mL) Final   POC Oxycodone UR 12/01/2021 None Detected  NONE DETECTED (Cut Off Level 100 ng/mL) Final   POC Marijuana UR 12/01/2021 Positive (A)  NONE DETECTED (Cut Off Level 50 ng/mL) Final   SARSCOV2ONAVIRUS 2  AG 12/01/2021 NEGATIVE  NEGATIVE Final   Comment: (NOTE) SARS-CoV-2 antigen NOT DETECTED.   Negative results are presumptive.  Negative results do not preclude SARS-CoV-2 infection and should not be used as the sole basis for treatment or other patient management decisions, including infection  control decisions, particularly in the presence of clinical signs and  symptoms consistent with COVID-19, or in those who have been in contact with the virus.  Negative results must be combined with clinical observations, patient history, and epidemiological information. The expected result is Negative.  Fact Sheet for Patients: HandmadeRecipes.com.cy  Fact Sheet for Healthcare Providers: FuneralLife.at  This test is not yet approved or cleared by the [place] FDA and  has been authorized for detection and/or diagnosis of SARS-CoV-2 by FDA under an Emergency Use Authorization (EUA).  This EUA will remain in effect (meaning this test can be used) for the duration of  the COV                          ID-19 declaration under Section 564(b)(1) of the Act, 21 U.S.C. section 360bbb-3(b)(1), unless the authorization is terminated or revoked sooner.     Cholesterol 12/01/2021 194  0 - 200 mg/dL Final   Triglycerides 12/01/2021 270 (H)  <150 mg/dL Final   HDL 12/01/2021 75  >40 mg/dL Final   Total CHOL/HDL Ratio 12/01/2021 2.6  RATIO Final   VLDL 12/01/2021 54 (H)  0 - 40 mg/dL Final   LDL Cholesterol 12/01/2021 65  0 - 99 mg/dL Final   Comment:        Total Cholesterol/HDL:CHD Risk Coronary Heart Disease Risk Table                     Men   Women  1/2 Average Risk   3.4   3.3  Average Risk       5.0   4.4  2  X Average Risk   9.6   7.1  3 X Average Risk  23.4   11.0        Use the calculated Patient Ratio above and the CHD Risk Table to determine the patient's CHD Risk.        ATP III CLASSIFICATION (LDL):  <100     mg/dL   Optimal   100-129  mg/dL   Near or Above                    Optimal  130-159  mg/dL   Borderline  160-189  mg/dL   High  >190     mg/dL   Very High Performed at [facility] [address] [place], [place] [address]     Blood Alcohol level:  Lab Results  Component Value Date   ETH 211 (H) 05/03/2022   ETH 107 (H) 08/65/7846    Metabolic Disorder Labs: Lab Results  Component Value Date   HGBA1C 5.8 (H) 05/03/2022   MPG 120 05/03/2022   MPG 105.41 12/01/2021   Lab Results  Component Value Date   PROLACTIN 9.7 06/08/2021   Lab Results  Component Value Date   CHOL 245 (H) 05/03/2022   TRIG 331 (H) 05/03/2022   HDL 104 05/03/2022   CHOLHDL 2.4 05/03/2022   VLDL 66 (H) 05/03/2022   LDLCALC 75 05/03/2022   LDLCALC 65 12/01/2021    Therapeutic Lab Levels: No results found for: "LITHIUM" No results found for: "VALPROATE" No results found for: "CBMZ"  Physical Findings   GAD-7    [place] Office Visit from 06/14/2020 in [place] Office Visit from 05/27/2020 in [place]  Total GAD-7 Score 11 9      PHQ2-9    [place] ED from 05/03/2022 in [facility] ED from 12/01/2021 in [facility] ED from 07/25/2021 in [facility] ED from 04/26/2021 in [facility] Office Visit from 06/14/2020 in [place]  PHQ-2 Total Score 4 2 2 4 2   PHQ-9 Total Score 20 19 9 19 15       Flowsheet Row ED from 05/03/2022 in [facility] ED from 12/01/2021 in [facility] ED from 07/25/2021 in [place] No Risk No Risk High Risk        Musculoskeletal  Strength & Muscle Tone: within normal limits Gait & Station: normal Patient leans: N/A  Psychiatric Specialty Exam   Presentation  General Appearance:  Appropriate for Environment; Casual  Eye Contact: Good  Speech: Clear and Coherent; Normal Rate  Speech Volume: Normal  Handedness: Right  Mood and Affect  Mood: "Anxious and depressed"  Affect: Congruent; Depressed  Thought Process  Thought Processes: Coherent; Goal Directed; Linear  Descriptions of Associations:Intact  Orientation: Answering questions appropriately  Thought Content:Logical; WDL  Diagnosis of Schizophrenia or Schizoaffective disorder in past: No    Hallucinations:Hallucinations: None  Ideas of Reference:None  Suicidal Thoughts:Suicidal Thoughts: No  Homicidal Thoughts:Homicidal Thoughts: No   Sensorium  Memory: Immediate Good; Recent Good  Judgment: Fair  Insight: Fair   Executive Functions  Concentration: Good  Attention Span: Good  Recall: Good  Fund of Knowledge: Good  Language: Good  Psychomotor Activity  Psychomotor Activity: Psychomotor Activity: Normal  Assets  Assets: Communication Skills; Desire for  Improvement; Financial Resources/Insurance; Leisure Time; Physical Health; Resilience; Social Support   Sleep  Sleep: Sleep: Fair   Nutritional Assessment (For OBS and FBC admissions only) Has the patient had a weight loss or gain of 10 pounds or more in the last 3 months?: No Has the patient had a decrease in food intake/or appetite?: Yes Does the patient have dental problems?: No Does the patient have eating habits or behaviors that may be indicators of an eating disorder including binging or inducing vomiting?: No Has the patient recently lost weight without trying?: 0 Has the patient been eating poorly because of a decreased appetite?: 1 Malnutrition Screening Tool Score: 1   Physical Exam  Constitutional:      Appearance: the patient is not toxic-appearing.  Pulmonary:     Effort: Pulmonary effort is normal.  Neurological:     General: No focal deficit  present.     Mental Status: the patient is alert and answering questions appropriately.   Review of Systems  Respiratory:  Negative for shortness of breath.   Cardiovascular:  Negative for chest pain.  Gastrointestinal:  Negative for abdominal pain, constipation, diarrhea, nausea and vomiting.  Neurological:  Negative for headaches.   Blood pressure 121/69, pulse 71, temperature 97.6 F (36.4 C), temperature source Oral, resp. rate 18, height 5' 11"  (1.803 m), weight 169 lb (76.7 kg), SpO2 96 %. Body mass index is 23.57 kg/m.  Treatment Plan Summary: Daily contact with patient to assess and evaluate symptoms and progress in treatment  Patient with ongoing severe alcohol use disorder and currently attempting to achieve sobriety. Last drink yesterday morning. Currently reporting ongoing depressive symptoms (anhedonia, guilt, depressed mood) that contribute to his substance use. We will continue zoloft 50 for his depressed mood with plan to uptitrate. Will also augment with abilify to target his obsessive thoughts and rumination. Given his desire for abstinence from his drinking and after discussion with patient, we also decided to start antabuse for prophalyxis for his alcohol use disorder. He has tried this in the past and reported it was effective. His plan is to go to the [facility] and we will provide those resources.   #Alcohol use disorder #Cannabis use #Cocaine use disorder  -Continue ativan taper  -Continue PRN's:  -Tylenol 650 mg q6hr PRN for mild pain -Mylanta 30 ml suspension for indigestion -Milk of Magnesia 30 ml for constipation -Trazodone 50 mg for insomnia -Hydroxyzine 25 mg tid PRN for anxiety -Start antabuse 266m  -Continue thiamine, MV  #Major Depressive Disorder  -Continue zoloft 574mwith plan to titrate to 10069momorrow  -Start abilify 2mg37mlan to increase to 5mg 69morrow   StephRolanda Lundborg PGY-1 05/04/2022 7:31 AM

## 2022-05-04 NOTE — ED Notes (Signed)
Patient accepted scheduled meds w/o difficulty. Converses with staff and other patients on the unit. No s&s of distress. Safety maintained and will continue to monitor.

## 2022-05-04 NOTE — ED Notes (Signed)
Patient observed/assessed in room in bed appearing in no immediate distress resting peacefully. Q15 minute checks continued by MHT and nursing staff. Will continue to monitor and support.

## 2022-05-04 NOTE — ED Notes (Signed)
Pt resting in bed. A&O x4, calm and cooperative. Denies current SI/HI/AVH. No signs of distress noted. Monitoring for safety.

## 2022-05-05 DIAGNOSIS — Z1152 Encounter for screening for COVID-19: Secondary | ICD-10-CM | POA: Diagnosis not present

## 2022-05-05 DIAGNOSIS — F1020 Alcohol dependence, uncomplicated: Secondary | ICD-10-CM | POA: Diagnosis not present

## 2022-05-05 MED ORDER — ARIPIPRAZOLE 2 MG PO TABS: 2.0000 mg | ORAL_TABLET | Freq: Every day | ORAL | 0 refills | Status: DC

## 2022-05-05 MED ORDER — SERTRALINE HCL 50 MG PO TABS: 50.0000 mg | ORAL_TABLET | Freq: Every day | ORAL | Status: DC

## 2022-05-05 MED ORDER — DISULFIRAM 250 MG PO TABS: 250.0000 mg | ORAL_TABLET | Freq: Every day | ORAL | 0 refills | Status: DC

## 2022-05-05 MED ORDER — ARIPIPRAZOLE 2 MG PO TABS
2.0000 mg | ORAL_TABLET | Freq: Every day | ORAL | Status: DC
  Administered 2022-05-05: 2 mg via ORAL
  Filled 2022-05-05: qty 1
  Filled 2022-05-05: qty 7

## 2022-05-05 MED ORDER — TRAZODONE HCL 50 MG PO TABS: 50.0000 mg | ORAL_TABLET | Freq: Every evening | ORAL | 0 refills | Status: DC | PRN

## 2022-05-05 MED ORDER — NICOTINE 14 MG/24HR TD PT24: 14.0000 mg | MEDICATED_PATCH | Freq: Every day | TRANSDERMAL | 0 refills | Status: DC

## 2022-05-05 MED ORDER — SERTRALINE HCL 50 MG PO TABS
50.0000 mg | ORAL_TABLET | Freq: Every day | ORAL | Status: DC
  Administered 2022-05-05: 50 mg via ORAL
  Filled 2022-05-05: qty 1
  Filled 2022-05-05: qty 7

## 2022-05-05 MED ORDER — SERTRALINE HCL 50 MG PO TABS: 50.0000 mg | ORAL_TABLET | Freq: Every day | ORAL | 0 refills | Status: DC

## 2022-05-05 NOTE — Progress Notes (Signed)
Patient is awake and alert out of bed and conversant with staff and peer.  Patient is anticipating discharge later this afternoon.  He ate lunch and is positive about future goals and recovery.  Encouraged to make needs known.  Will monitor.

## 2022-05-05 NOTE — Discharge Instructions (Addendum)
Complete your [facility] intake appointment.   [place], [place] [address] [phone] OUTPATIENT Walk-in information: Please note, all walk-ins are first come & first serve, with limited number of availability.  Please note that to be eligible for services you must bring: ID or a piece of mail with your name [facility] address  Therapist for therapy:  Monday & Wednesdays: Please ARRIVE at 7:15 AM for registration Will START at 8:00 AM Every 1st & 2nd Friday of the month: Please ARRIVE at 10:15 AM for registration Will START at 1 PM - 5 PM  Psychiatrist for medication management: Monday - Friday:  Please ARRIVE at 7:15 AM for registration Will START at 8:00 AM  Regretfully, due to limited availability, please be aware that you may not been seen on the same day as walk-in. Please consider making an appoint or try again. Thank you for your patience and understanding.

## 2022-05-05 NOTE — ED Notes (Signed)
Patient is presently resting in bed without distress or complaint.  Denies avh shi or plan.  Will monitor.

## 2022-05-05 NOTE — ED Notes (Signed)
Pt asleep in bed. Respirations even and unlabored. Monitoring for safety.

## 2022-05-05 NOTE — ED Provider Notes (Signed)
FBC/OBS ASAP Discharge Summary  Date and Time: 05/05/2022 12:49 PM  Name: Brendan Ochoa  MRN:  [id number]   Discharge Diagnoses:  Final diagnoses:  Alcohol use disorder, severe, dependence ([place])    Subjective:  Brendan Ochoa is a 46 yr old male who presented on 12/21 to [facility] with request for Detox, he was accepted to [facility] on 12/21.  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   He reports that he would like to be discharged today.  He reports that he has felt better than he has in a long time and feels like he has hope.  He reports that starting medications have been very successful.  He reports significant reduction in his anxiety and reports he has not felt this good in several years.  He reports no side effects to the Zoloft or Abilify.  He reports that he plans to continue to receive therapy from family services at the [place] and that he has an intake appointment scheduled for an [place].  He reports that he is interested in starting medication management at [facility] and wanted the information for walk-in hours.  Discussed with him that his Ativan detox had not finished yet and that we would be unable to prescribe him Ativan on discharge.  When asked about history of withdrawals he reports that 7 or 8 years ago (before 2015) there was 1 time where he was seeing demons during a detox.  He reports that since that time he has self detoxed multiple times and never had any other AVH.  He reports no history of alcohol withdrawal seizures.  Asked again about current use timeline.  He reports that his last drink was the morning before he presented to  [facility] (which would be 12/20 ).  He reports that prior to drinking on that morning he had already stopped drinking for approximately 2 days prior.  Discussed with him that stopping the Ativan  detox early could put him at risk for DTs, withdrawal seizures, and potentially death.  Discussed with him that if he begins having hallucinations or seizures he needs to be immediately evaluated in the nearest emergency room.  He reported understanding of the risk and was agreeable with the instructions.  Today he reports no cravings.  He does report still having mild withdrawal symptoms-minimal shakes, mild headache, and mild diarrhea.  He reports the symptoms are significantly improved within the last 24 hours alone.  He reports no SI, HI, or AVH.  He reports his sleep is good.  He reports appetite is doing good.  He reports no other concerns present.   Stay Summary:  Brendan Ochoa is a 46 yr old male who presented on 12/21 to [facility] with request for Detox, he was accepted to [facility] on 12/21. He was started on an Ativan taper and Antabuse.  For his anxiety and depression he was started on Zoloft and Abilify.  He responded well to his medications and had minimal withdrawal symptoms.  He requested discharge early before completing the Ativan taper and after discussing potential risks wanted early discharge. He was discharged on 12/23.   Total Time spent with patient: 20 minutes  Past Psychiatric History: stimulant-induced psychotic disorder, alcohol use disorder, cannabis use, cocaine use disorder   Past Medical History:  Past Medical History:  Diagnosis Date   Asthma    Cocaine abuse ([place])  in past   Crohn disease ([place]) 2008   Depression    in past, not current as of 2019    ETOH abuse    history of alcohol use   GERD (gastroesophageal reflux disease)     Past Surgical History:  Procedure Laterality Date   BIOPSY  10/15/2017   Procedure: BIOPSY;  Surgeon: Danie Binder, MD;  Location: [facility] ENDO SUITE;  Service: Endoscopy;;  terminal ileum colon   COLONOSCOPY WITH PROPOFOL N/A 10/15/2017   redundant colon, normal rectum, external and internal hemorrhoids, quiescent crohn's. Pathology:  inactive chronicn nonspecific ileitis   ESOPHAGOGASTRODUODENOSCOPY (EGD) WITH PROPOFOL N/A 10/15/2017   normal esophagus, small hiatal hernia, gastritis, duodenitis   FINGER AMPUTATION Right 2012   cut finger table saw and surgically amputated   Family History:  Family History  Problem Relation Age of Onset   Stomach cancer Maternal Grandfather    Colon cancer Neg Hx    Family Psychiatric History: alcohol use disorder, multiple family members diagnosed with alcohol and substance use disorders  Social History:  Social History   Substance and Sexual Activity  Alcohol Use Not Currently   Comment: alcoholic- last use 1/61/09     Social History   Substance and Sexual Activity  Drug Use Not Currently   Types: Cocaine, Marijuana, Methamphetamines   Comment: last cocaine use 10/10/20.  last meth april 2022    Social History   Socioeconomic History   Marital status: Single    Spouse name: Not on file   Number of children: Not on file   Years of education: Not on file   Highest education level: Not on file  Occupational History   Not on file  Tobacco Use   Smoking status: Every Day    Packs/day: 0.50    Years: 15.00    Total pack years: 7.50    Types: Cigarettes   Smokeless tobacco: Never   Tobacco comments:    1/4 to 1/2 pack per day   Vaping Use   Vaping Use: Never used  Substance and Sexual Activity   Alcohol use: Not Currently    Comment: alcoholic- last use 10/15/52   Drug use: Not Currently    Types: Cocaine, Marijuana, Methamphetamines    Comment: last cocaine use 10/10/20.  last meth april 2022   Sexual activity: Not on file  Other Topics Concern   Not on file  Social History Narrative   Not on file   Social Determinants of Health   Financial Resource Strain: Not on file  Food Insecurity: Not on file  Transportation Needs: Not on file  Physical Activity: Not on file  Stress: Not on file  Social Connections: Not on file   SDOH:  SDOH Screenings    Depression (PHQ2-9): Medium Risk (05/05/2022)  Tobacco Use: High Risk (04/27/2021)    Tobacco Cessation:  A prescription for an FDA-approved tobacco cessation medication provided at discharge  Current Medications:  Current Facility-Administered Medications  Medication Dose Route Frequency Provider Last Rate Last Admin   acetaminophen (TYLENOL) tablet 650 mg  650 mg Oral Q6H PRN Lucky Rathke, FNP       alum & mag hydroxide-simeth (MAALOX/MYLANTA) 200-200-20 MG/5ML suspension 30 mL  30 mL Oral Q4H PRN Lucky Rathke, FNP       ARIPiprazole (ABILIFY) tablet 2 mg  2 mg Oral Daily Briant Cedar, MD   2 mg at 05/05/22 0912   disulfiram (ANTABUSE) tablet 250 mg  250 mg Oral Daily  Rolanda Lundborg, MD   250 mg at 05/05/22 3403   hydrOXYzine (ATARAX) tablet 25 mg  25 mg Oral TID PRN Lucky Rathke, FNP       loperamide (IMODIUM) capsule 2-4 mg  2-4 mg Oral PRN Lucky Rathke, FNP       LORazepam (ATIVAN) tablet 1 mg  1 mg Oral Q6H PRN Lucky Rathke, FNP       magnesium hydroxide (MILK OF MAGNESIA) suspension 30 mL  30 mL Oral Daily PRN Lucky Rathke, FNP       multivitamin with minerals tablet 1 tablet  1 tablet Oral Daily Lucky Rathke, FNP   1 tablet at 05/05/22 0912   nicotine (NICODERM CQ - dosed in mg/24 hours) patch 14 mg  14 mg Transdermal Daily Lucky Rathke, FNP   14 mg at 05/04/22 0924   ondansetron (ZOFRAN-ODT) disintegrating tablet 4 mg  4 mg Oral Q6H PRN Lucky Rathke, FNP   4 mg at 05/03/22 1753   sertraline (ZOLOFT) tablet 50 mg  50 mg Oral Daily Briant Cedar, MD   50 mg at 05/05/22 5248   thiamine (VITAMIN B1) injection 100 mg  100 mg Intramuscular Once Lucky Rathke, FNP       thiamine (VITAMIN B1) tablet 100 mg  100 mg Oral Daily Lucky Rathke, FNP   100 mg at 05/05/22 0912   traZODone (DESYREL) tablet 50 mg  50 mg Oral QHS PRN Lucky Rathke, FNP   50 mg at 05/04/22 2117   Current Outpatient Medications  Medication Sig Dispense Refill   Multiple Vitamin  (MULTIVITAMIN WITH MINERALS) TABS tablet Take 1 tablet by mouth daily.     albuterol (VENTOLIN HFA) 108 (90 Base) MCG/ACT inhaler Inhale 2 puffs into the lungs every 6 (six) hours as needed for wheezing or shortness of breath. 8 g 1   [START ON 05/06/2022] ARIPiprazole (ABILIFY) 2 MG tablet Take 1 tablet (2 mg total) by mouth daily. 30 tablet 0   [START ON 05/06/2022] disulfiram (ANTABUSE) 250 MG tablet Take 1 tablet (250 mg total) by mouth daily. 30 tablet 0   [START ON 05/06/2022] nicotine (NICODERM CQ - DOSED IN MG/24 HOURS) 14 mg/24hr patch Place 1 patch (14 mg total) onto the skin daily. 14 patch 0   [START ON 05/06/2022] sertraline (ZOLOFT) 50 MG tablet Take 1 tablet (50 mg total) by mouth daily. 30 tablet 0   traZODone (DESYREL) 50 MG tablet Take 1 tablet (50 mg total) by mouth at bedtime as needed for sleep. 15 tablet 0    PTA Medications: (Not in a hospital admission)      05/05/2022   12:48 PM 05/04/2022    4:25 PM 05/03/2022    1:25 PM  Depression screen PHQ 2/9  Decreased Interest 2 3 2   Down, Depressed, Hopeless 1 3 2   PHQ - 2 Score 3 6 4   Altered sleeping 1 2 3   Tired, decreased energy 1 3 2   Change in appetite 1 2 3   Feeling bad or failure about yourself  1 3 2   Trouble concentrating 2 3 2   Moving slowly or fidgety/restless 1 2 2   Suicidal thoughts 0 0 2  PHQ-9 Score 10 21 20   Difficult doing work/chores Somewhat difficult  Very difficult    Flowsheet Row ED from 05/03/2022 in [facility] ED from 12/01/2021 in [facility] ED from 07/25/2021 in [facility]  C-SSRS RISK CATEGORY No Risk No Risk High Risk       Musculoskeletal  Strength & Muscle Tone: within normal limits Gait & Station: normal Patient leans: N/A  Psychiatric Specialty Exam  Presentation  General Appearance:  Appropriate for Environment; Casual  Eye Contact: Good  Speech: Clear and Coherent; Normal  Rate  Speech Volume: Normal  Handedness: Right   Mood and Affect  Mood: Euthymic  Affect: Congruent; Appropriate   Thought Process  Thought Processes: Coherent; Goal Directed  Descriptions of Associations:Intact  Orientation:Full (Time, Place and Person)  Thought Content:Logical; WDL  Diagnosis of Schizophrenia or Schizoaffective disorder in past: No    Hallucinations:Hallucinations: None  Ideas of Reference:None  Suicidal Thoughts:Suicidal Thoughts: No  Homicidal Thoughts:Homicidal Thoughts: No   Sensorium  Memory: Immediate Fair; Recent Fair  Judgment: Fair  Insight: Fair   Community education officer  Concentration: Good  Attention Span: Good  Recall: Good  Fund of Knowledge: Good  Language: Good   Psychomotor Activity  Psychomotor Activity:Psychomotor Activity: Normal   Assets  Assets: Communication Skills; Desire for Improvement; Financial Resources/Insurance; Physical Health; Resilience; Social Support   Sleep  Sleep:Sleep: Good   No data recorded  Physical Exam  Physical Exam Vitals and nursing note reviewed.  Constitutional:      General: He is not in acute distress.    Appearance: Normal appearance. He is normal weight. He is not ill-appearing or toxic-appearing.  HENT:     Head: Normocephalic and atraumatic.  Pulmonary:     Effort: Pulmonary effort is normal.  Musculoskeletal:        General: Normal range of motion.  Neurological:     General: No focal deficit present.     Mental Status: He is alert.    Review of Systems  Respiratory:  Negative for cough and shortness of breath.   Cardiovascular:  Negative for chest pain.  Gastrointestinal:  Positive for diarrhea (mild). Negative for abdominal pain, constipation, nausea and vomiting.  Neurological:  Positive for tremors (minimal) and headaches (mild). Negative for dizziness and weakness.   Blood pressure 124/83, pulse 71, temperature 98.1 F (36.7 C),  temperature source Oral, resp. rate 20, height 5' 11"  (1.803 m), weight 169 lb (76.7 kg), SpO2 97 %. Body mass index is 23.57 kg/m.  Demographic Factors:  Male, Caucasian, and Unemployed  Loss Factors: NA  Historical Factors: Family history of mental illness or substance abuse  Risk Reduction Factors:   Living with another person, especially a relative, Positive social support, and Positive therapeutic relationship  Continued Clinical Symptoms:  Alcohol/Substance Abuse/Dependencies  Cognitive Features That Contribute To Risk:  None    Suicide Risk:  Minimal: No identifiable suicidal ideation.  Patients presenting with no risk factors but with morbid ruminations; may be classified as minimal risk based on the severity of the depressive symptoms  Plan Of Care/Follow-up recommendations/Disposition:  Attend your [facility] intake appointment. Continue to attend therapy appointments at [facility]. Information for [facility] Walk-in hours for Medication Management Provided.   Activity: as tolerated  Diet: heart healthy  Other: -Follow-up with your outpatient psychiatric provider -instructions on appointment date, time, and address (location) are provided to you in discharge paperwork.  -Take your psychiatric medications as prescribed at discharge - instructions are provided to you in the discharge paperwork  -Follow-up with outpatient primary care doctor and other specialists -for management of chronic medical disease, including: Elevated cholesterol  -Testing: Follow-up with outpatient provider for abnormal lab results: Elevated cholesterol  -  Recommend abstinence from alcohol, tobacco, and other illicit drug use at discharge.   -If your psychiatric symptoms recur, worsen, or if you have side effects to your psychiatric medications, call your outpatient psychiatric provider, [phone] or go to the nearest emergency department.  -If suicidal thoughts recur,  call your outpatient psychiatric provider, 911, [phone] or go to the nearest emergency department.   Briant Cedar, MD 05/05/2022, 12:49 PM

## 2022-05-23 ENCOUNTER — Encounter (HOSPITAL_COMMUNITY): Payer: Self-pay | Admitting: Physician Assistant

## 2022-05-23 ENCOUNTER — Ambulatory Visit (INDEPENDENT_AMBULATORY_CARE_PROVIDER_SITE_OTHER): Payer: No Payment, Other | Admitting: Physician Assistant

## 2022-05-23 DIAGNOSIS — F331 Major depressive disorder, recurrent, moderate: Secondary | ICD-10-CM

## 2022-05-23 DIAGNOSIS — G479 Sleep disorder, unspecified: Secondary | ICD-10-CM

## 2022-05-23 DIAGNOSIS — F411 Generalized anxiety disorder: Secondary | ICD-10-CM | POA: Diagnosis not present

## 2022-05-23 DIAGNOSIS — F1020 Alcohol dependence, uncomplicated: Secondary | ICD-10-CM

## 2022-05-23 MED ORDER — ARIPIPRAZOLE 2 MG PO TABS: 2.0000 mg | ORAL_TABLET | Freq: Every day | ORAL | 1 refills | Status: DC

## 2022-05-23 MED ORDER — TRAZODONE HCL 50 MG PO TABS: 50.0000 mg | ORAL_TABLET | Freq: Every evening | ORAL | 1 refills | Status: DC | PRN

## 2022-05-23 MED ORDER — SERTRALINE HCL 50 MG PO TABS: 50.0000 mg | ORAL_TABLET | Freq: Every day | ORAL | 1 refills | Status: DC
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
## 2022-07-04 ENCOUNTER — Encounter (HOSPITAL_COMMUNITY): Payer: Self-pay | Admitting: Physician Assistant

## 2022-07-04 ENCOUNTER — Ambulatory Visit (INDEPENDENT_AMBULATORY_CARE_PROVIDER_SITE_OTHER): Payer: No Payment, Other | Admitting: Physician Assistant

## 2022-07-04 DIAGNOSIS — F331 Major depressive disorder, recurrent, moderate: Secondary | ICD-10-CM

## 2022-07-04 DIAGNOSIS — F411 Generalized anxiety disorder: Secondary | ICD-10-CM | POA: Diagnosis not present

## 2022-07-04 MED ORDER — ARIPIPRAZOLE 2 MG PO TABS: 2.0000 mg | ORAL_TABLET | Freq: Every day | ORAL | 2 refills | Status: DC

## 2022-07-04 MED ORDER — SERTRALINE HCL 100 MG PO TABS: 100.0000 mg | ORAL_TABLET | Freq: Every day | ORAL | 2 refills | Status: DC

## 2022-07-04 NOTE — Progress Notes (Signed)
BH MD/PA/NP OP Progress Note  07/04/2022 6:20 PM Brendan Ochoa  MRN:  [id number]  Chief Complaint:  Chief Complaint  Patient presents with   Follow-up   Medication Management   HPI: ***  Brendan Ochoa  Visit Diagnosis:    ICD-10-CM   1. Generalized anxiety disorder  F41.1 sertraline (ZOLOFT) 100 MG tablet    2. Moderate episode of recurrent major depressive disorder (HCC)  F33.1 sertraline (ZOLOFT) 100 MG tablet    ARIPiprazole (ABILIFY) 2 MG tablet      Past Psychiatric History:  Patient endorses a past history of bipolar disorder. Patient has also been diagnosed with major depressive disorder. Patient struggles with alcohol abuse and has also struggled with drug abuse in the past   Past Medical History:  Past Medical History:  Diagnosis Date   Asthma    Cocaine abuse ([place])    in past   Crohn disease ([place]) 2008   Depression    in past, not current as of 2019    ETOH abuse    history of alcohol use   GERD (gastroesophageal reflux disease)     Past Surgical History:  Procedure Laterality Date   BIOPSY  10/15/2017   Procedure: BIOPSY;  Surgeon: Danie Binder, MD;  [facility];  Service: Endoscopy;;  terminal ileum colon   COLONOSCOPY WITH PROPOFOL N/A 10/15/2017   redundant colon, normal rectum, external and internal hemorrhoids, quiescent crohn's. Pathology: inactive chronicn nonspecific ileitis   ESOPHAGOGASTRODUODENOSCOPY (EGD) WITH PROPOFOL N/A 10/15/2017   normal esophagus, small hiatal hernia, gastritis, duodenitis   FINGER AMPUTATION Right 2012   cut finger table saw and surgically amputated    Family Psychiatric History:  Grandfather (maternal) - Bipolar disorder   Patient reports that he is unsure of anyone else with psychiatric issues and states that his mother's side of the family has a history of mental illness.   Family history of suicide: Great uncle (maternal)   Patient denies a family history of homicide   Substance abuse,  patient states that 90% of of his cousins either abused alcohol or drugs  Family History:  Family History  Problem Relation Age of Onset   Stomach cancer Maternal Grandfather    Colon cancer Neg Hx     Social History:  Social History   Socioeconomic History   Marital status: Single    Spouse name: Not on file   Number of children: Not on file   Years of education: Not on file   Highest education level: Not on file  Occupational History   Not on file  Tobacco Use   Smoking status: Every Day    Packs/day: 0.50    Years: 15.00    Total pack years: 7.50    Types: Cigarettes   Smokeless tobacco: Never   Tobacco comments:    1/4 to 1/2 pack per day   Vaping Use   Vaping Use: Never used  Substance and Sexual Activity   Alcohol use: Not Currently    Comment: alcoholic- last use [id number]   Drug use: Not Currently    Types: Cocaine, Marijuana, Methamphetamines    Comment: last cocaine use 10/10/20.  last meth april 2022   Sexual activity: Not on file  Other Topics Concern   Not on file  Social History Narrative   Not on file   Social Determinants of Health   Financial Resource Strain: Not on file  Food Insecurity: Not on file  Transportation Needs: Not on  file  Physical Activity: Not on file  Stress: Not on file  Social Connections: Not on file    Allergies: No Known Allergies  Metabolic Disorder Labs: Lab Results  Component Value Date   HGBA1C 5.8 (H) 05/03/2022   MPG 120 05/03/2022   MPG 105.41 12/01/2021   Lab Results  Component Value Date   PROLACTIN 4.0 05/03/2022   PROLACTIN 9.7 06/08/2021   Lab Results  Component Value Date   CHOL 245 (H) 05/03/2022   TRIG 331 (H) 05/03/2022   HDL 104 05/03/2022   CHOLHDL 2.4 05/03/2022   VLDL 66 (H) 05/03/2022   LDLCALC 75 05/03/2022   LDLCALC 65 12/01/2021   Lab Results  Component Value Date   TSH 0.940 05/03/2022   TSH 2.185 12/01/2021    Therapeutic Level Labs: No results found for: "LITHIUM" No  results found for: "VALPROATE" No results found for: "CBMZ"  Current Medications: Current Outpatient Medications  Medication Sig Dispense Refill   albuterol (VENTOLIN HFA) 108 (90 Base) MCG/ACT inhaler Inhale 2 puffs into the lungs every 6 (six) hours as needed for wheezing or shortness of breath. 8 g 1   ARIPiprazole (ABILIFY) 2 MG tablet Take 1 tablet (2 mg total) by mouth daily. 30 tablet 2   disulfiram (ANTABUSE) 250 MG tablet Take 1 tablet (250 mg total) by mouth daily. 30 tablet 0   Multiple Vitamin (MULTIVITAMIN WITH MINERALS) TABS tablet Take 1 tablet by mouth daily.     nicotine (NICODERM CQ - DOSED IN MG/24 HOURS) 14 mg/24hr patch Place 1 patch (14 mg total) onto the skin daily. 14 patch 0   sertraline (ZOLOFT) 100 MG tablet Take 1 tablet (100 mg total) by mouth daily. 30 tablet 2   traZODone (DESYREL) 50 MG tablet Take 1 tablet (50 mg total) by mouth at bedtime as needed for sleep. 30 tablet 1   No current facility-administered medications for this visit.     Musculoskeletal: Strength & Muscle Tone: within normal limits Gait & Station: normal Patient leans: N/A  Psychiatric Specialty Exam: Review of Systems  Psychiatric/Behavioral:  Negative for decreased concentration, dysphoric mood, hallucinations, self-injury, sleep disturbance and suicidal ideas. The patient is not nervous/anxious and is not hyperactive.     Blood pressure 112/72, pulse 68, temperature 98.5 F (36.9 C), temperature source Oral, height '5\' 11"'$  (1.803 m), weight 161 lb 12.8 oz (73.4 kg).Body mass index is 22.57 kg/m.  General Appearance: Well Groomed  Eye Contact:  Good  Speech:  Clear and Coherent and Normal Rate  Volume:  Normal  Mood:   Mild depression  Affect:  Appropriate  Thought Process:  Coherent, Goal Directed, and Descriptions of Associations: Intact  Orientation:  Full (Time, Place, and Person)  Thought Content: WDL   Suicidal Thoughts:  No  Homicidal Thoughts:  No  Memory:  Immediate;    Good Recent;   Good Remote;   Fair  Judgement:  Good  Insight:  Good  Psychomotor Activity:  Normal  Concentration:  Concentration: Good and Attention Span: Good  Recall:  Good  Fund of Knowledge: Good  Language: Good  Akathisia:  No  Handed:  Right  AIMS (if indicated): not done  Assets:  Communication Skills Desire for Improvement Social Support Transportation  ADL's:  Intact  Cognition: WNL  Sleep:  Good   Screenings: GAD-7    Flowsheet Row Clinical Support from 07/04/2022 in [facility] Office Visit from 06/14/2020 in [place]  Office Visit from 05/27/2020 in [place]  Total GAD-7 Score 0 11 9      PHQ2-9    Flowsheet Row Clinical Support from 07/04/2022 in [facility] ED from 05/03/2022 in [facility] ED from 12/01/2021 in [facility] ED from 07/25/2021 in [facility] ED from 04/26/2021 in [facility]  PHQ-2 Total Score 0 '3 2 2 4  '$ PHQ-9 Total Score -- '10 19 9 19      '$ Flowsheet Row Clinical Support from 07/04/2022 in [facility] Office Visit from 05/23/2022 in [facility] ED from 05/03/2022 in [place] No Risk No Risk No Risk        Assessment and Plan: ***    Collaboration of Care: Collaboration of Care: Medication Management AEB provider managing patient's psychiatric medications, Psychiatrist AEB patient being seen by a mental health provider at this facility, and Referral or follow-up with counselor/therapist AEB patient being seen by a therapist outside of this facility  Patient/Guardian was advised Release of Information must be obtained prior to any record release in order to collaborate their care with an  outside provider. Patient/Guardian was advised if they have not already done so to contact the registration department to sign all necessary forms in order for [name] to release information regarding their care.   Consent: Patient/Guardian gives verbal consent for treatment and assignment of benefits for services provided during this visit. Patient/Guardian expressed understanding and agreed to proceed.   1. Generalized anxiety disorder  - sertraline (ZOLOFT) 100 MG tablet; Take 1 tablet (100 mg total) by mouth daily.  Dispense: 30 tablet; Refill: 2  2. Moderate episode of recurrent major depressive disorder (HCC)  - sertraline (ZOLOFT) 100 MG tablet; Take 1 tablet (100 mg total) by mouth daily.  Dispense: 30 tablet; Refill: 2 - ARIPiprazole (ABILIFY) 2 MG tablet; Take 1 tablet (2 mg total) by mouth daily.  Dispense: 30 tablet; Refill: 2  Patient to follow-up in 2 months Provider spent a total of 19 minutes with the patient/reviewing patient's chart  Malachy Mood, PA 07/04/2022, 6:21 PM

## 2022-09-05 ENCOUNTER — Encounter (HOSPITAL_COMMUNITY): Payer: Self-pay | Admitting: Physician Assistant

## 2022-09-05 ENCOUNTER — Ambulatory Visit (INDEPENDENT_AMBULATORY_CARE_PROVIDER_SITE_OTHER): Payer: No Payment, Other | Admitting: Physician Assistant

## 2022-09-05 DIAGNOSIS — F331 Major depressive disorder, recurrent, moderate: Secondary | ICD-10-CM | POA: Diagnosis not present

## 2022-09-05 DIAGNOSIS — F411 Generalized anxiety disorder: Secondary | ICD-10-CM

## 2022-09-05 MED ORDER — SERTRALINE HCL 100 MG PO TABS: 100.0000 mg | ORAL_TABLET | Freq: Every day | ORAL | 3 refills | Status: DC

## 2022-09-05 MED ORDER — ARIPIPRAZOLE 2 MG PO TABS: 2.0000 mg | ORAL_TABLET | Freq: Every day | ORAL | 3 refills | Status: DC

## 2022-09-05 NOTE — Progress Notes (Signed)
BH MD/PA/NP OP Progress Note  09/05/2022 4:16 PM Brendan Ochoa  MRN:  [id number]  Chief Complaint:  Chief Complaint  Patient presents with   Follow-up   Medication Refill   HPI:   Brendan Ochoa is a 47 year old, Caucasian male with a past psychiatric history significant for generalized anxiety disorder, major depressive disorder and sleep disturbances who presents to [facility] for follow-up and medication management.  Patient is currently being managed on the following psychiatric medications:  Trazodone 50 mg at bedtime Abilify 2 mg daily Sertraline 50 mg daily  Patient reports no issues or concerns regarding his current medication regimen.  Patient denies experiencing any adverse side effects from his medications.  Patient reports that his life is totally different since being on his medications.  He reports that he has more energy and more drive to do things.  He states that even when stressed, he is able to get through obstacles life does his wife.  Patient also adds that he has been sober for 3 months now.  Patient denies depression stating that sertraline and Abilify have been his marital drugs.  Patient denies anxiety but does occasionally endorse moments where he has some anxiety attributed to the future.  Patient states that he is currently working at his part-time job at 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  Patient reports that he still goes to his meetings with this therapist twice a week and participates in gardening during his downtime.  Patient is alert and oriented x 4, calm, cooperative, and fully engaged in conversation during the encounter.  Patient describes his mood as wonderful.  Patient denies suicidal or homicidal ideation.  He further denies auditory or visual hallucinations and does not appear to be responding to internal/external stimuli.  Patient endorses  good sleep and receives on average 7 to 8 hours of sleep each night.  Patient endorses good appetite and states that he eats 4-5 small meals per day.  Patient denies alcohol consumption and illicit drug use.  Patient endorses tobacco use and smokes on average 5 to 6 cigarettes/day.  Visit Diagnosis:    ICD-10-CM   1. Generalized anxiety disorder  F41.1 sertraline (ZOLOFT) 100 MG tablet    2. Moderate episode of recurrent major depressive disorder  F33.1 sertraline (ZOLOFT) 100 MG tablet    ARIPiprazole (ABILIFY) 2 MG tablet      Past Psychiatric History:  Patient endorses a past history of bipolar disorder. Patient has also been diagnosed with major depressive disorder. Patient struggles with alcohol abuse and has also struggled with drug abuse in the past   Past Medical History:  Past Medical History:  Diagnosis Date   Asthma    Cocaine abuse    in past   Crohn disease 2008   Depression    in past, not current as of 2019    ETOH abuse    history of alcohol use   GERD (gastroesophageal reflux disease)     Past Surgical History:  Procedure Laterality Date   BIOPSY  10/15/2017   Procedure: BIOPSY;  Surgeon: West Bali, MD;  [facility];  Service: Endoscopy;;  terminal ileum colon   COLONOSCOPY WITH PROPOFOL N/A 10/15/2017   redundant colon, normal rectum, external and internal hemorrhoids, quiescent crohn's. Pathology: inactive chronicn nonspecific ileitis   ESOPHAGOGASTRODUODENOSCOPY (EGD) WITH PROPOFOL N/A 10/15/2017   normal esophagus, small hiatal hernia, gastritis, duodenitis  FINGER AMPUTATION Right 2012   cut finger table saw and surgically amputated    Family Psychiatric History:  Grandfather (maternal) - Bipolar disorder   Patient reports that he is unsure of anyone else with psychiatric issues and states that his mother's side of the family has a history of mental illness.   Family history of suicide: Great uncle (maternal)   Patient denies a family  history of homicide   Substance abuse, patient states that 90% of of his cousins either abused alcohol or drugs  Family History:  Family History  Problem Relation Age of Onset   Stomach cancer Maternal Grandfather    Colon cancer Neg Hx     Social History:  Social History   Socioeconomic History   Marital status: Single    Spouse name: Not on file   Number of children: Not on file   Years of education: Not on file   Highest education level: Not on file  Occupational History   Not on file  Tobacco Use   Smoking status: Every Day    Packs/day: 0.50    Years: 15.00    Additional pack years: 0.00    Total pack years: 7.50    Types: Cigarettes   Smokeless tobacco: Never   Tobacco comments:    1/4 to 1/2 pack per day   Vaping Use   Vaping Use: Never used  Substance and Sexual Activity   Alcohol use: Not Currently    Comment: alcoholic- last use 11/10/20   Drug use: Not Currently    Types: Cocaine, Marijuana, Methamphetamines    Comment: last cocaine use 10/10/20.  last meth april 2022   Sexual activity: Not on file  Other Topics Concern   Not on file  Social History Narrative   Not on file   Social Determinants of Health   Financial Resource Strain: Not on file  Food Insecurity: Not on file  Transportation Needs: Not on file  Physical Activity: Not on file  Stress: Not on file  Social Connections: Not on file    Allergies: No Known Allergies  Metabolic Disorder Labs: Lab Results  Component Value Date   HGBA1C 5.8 (H) 05/03/2022   MPG 120 05/03/2022   MPG 105.41 12/01/2021   Lab Results  Component Value Date   PROLACTIN 4.0 05/03/2022   PROLACTIN 9.7 06/08/2021   Lab Results  Component Value Date   CHOL 245 (H) 05/03/2022   TRIG 331 (H) 05/03/2022   HDL 104 05/03/2022   CHOLHDL 2.4 05/03/2022   VLDL 66 (H) 05/03/2022   LDLCALC 75 05/03/2022   LDLCALC 65 12/01/2021   Lab Results  Component Value Date   TSH 0.940 05/03/2022   TSH 2.185  12/01/2021    Therapeutic Level Labs: No results found for: "LITHIUM" No results found for: "VALPROATE" No results found for: "CBMZ"  Current Medications: Current Outpatient Medications  Medication Sig Dispense Refill   albuterol (VENTOLIN HFA) 108 (90 Base) MCG/ACT inhaler Inhale 2 puffs into the lungs every 6 (six) hours as needed for wheezing or shortness of breath. 8 g 1   ARIPiprazole (ABILIFY) 2 MG tablet Take 1 tablet (2 mg total) by mouth daily. 30 tablet 3   disulfiram (ANTABUSE) 250 MG tablet Take 1 tablet (250 mg total) by mouth daily. 30 tablet 0   Multiple Vitamin (MULTIVITAMIN WITH MINERALS) TABS tablet Take 1 tablet by mouth daily.     nicotine (NICODERM CQ - DOSED IN MG/24 HOURS) 14 mg/24hr patch Place  1 patch (14 mg total) onto the skin daily. 14 patch 0   sertraline (ZOLOFT) 100 MG tablet Take 1 tablet (100 mg total) by mouth daily. 30 tablet 3   traZODone (DESYREL) 50 MG tablet Take 1 tablet (50 mg total) by mouth at bedtime as needed for sleep. 30 tablet 1   No current facility-administered medications for this visit.     Musculoskeletal: Strength & Muscle Tone: within normal limits Gait & Station: normal Patient leans: N/A  Psychiatric Specialty Exam: Review of Systems  Psychiatric/Behavioral:  Negative for decreased concentration, dysphoric mood, hallucinations, self-injury, sleep disturbance and suicidal ideas. The patient is not nervous/anxious and is not hyperactive.     Blood pressure 123/82, pulse 78, height  (1.803 m), weight 160 lb 9.6 oz (72.8 kg), SpO2 97 %.Body mass index is 22.4 kg/m.  General Appearance: Well Groomed  Eye Contact:  Good  Speech:  Clear and Coherent and Normal Rate  Volume:  Normal  Mood:   Mild depression  Affect:  Appropriate  Thought Process:  Coherent, Goal Directed, and Descriptions of Associations: Intact  Orientation:  Full (Time, Place, and Person)  Thought Content: WDL   Suicidal Thoughts:  No  Homicidal  Thoughts:  No  Memory:  Immediate;   Good Recent;   Good Remote;   Fair  Judgement:  Good  Insight:  Good  Psychomotor Activity:  Normal  Concentration:  Concentration: Good and Attention Span: Good  Recall:  Good  Fund of Knowledge: Good  Language: Good  Akathisia:  No  Handed:  Right  AIMS (if indicated): not done  Assets:  Communication Skills Desire for Improvement Social Support Transportation  ADL's:  Intact  Cognition: WNL  Sleep:  Good   Screenings: GAD-7    Flowsheet Row Clinical Support from 09/05/2022 in [facility] Clinical Support from 07/04/2022 in [facility] Office Visit from 06/14/2020 in [facility] Office Visit from 05/27/2020 in [facility]  Total GAD-7 Score 0 0 11 9      PHQ2-9    Flowsheet Row Clinical Support from 09/05/2022 in [facility] Clinical Support from 07/04/2022 in [facility] ED from 05/03/2022 in [facility] ED from 12/01/2021 in [facility] ED from 07/25/2021 in [facility]  PHQ-2 Total Score 0 0 PHQ-9 Total Score -- -- Flowsheet Row Clinical Support from 09/05/2022 in [facility] Clinical Support from 07/04/2022 in [facility] Office Visit from 05/23/2022 in [facility]  C-SSRS RISK CATEGORY No Risk No Risk No Risk        Assessment and Plan:   Brendan Ochoa is a 47 year old, Caucasian male with a past psychiatric history significant for generalized anxiety disorder, major depressive disorder and sleep disturbances who presents to [facility] for follow-up and medication management.  Patient reports no issues or concerns  regarding his current medication regimen.  Patient denies experiencing any adverse side effects and denies the need for dosage adjustments on his medications at this time.  Patient denies depression stating that he finds himself having more energy and more drive to do things.  Patient also denies anxiety but occasionally expresses some anxiety attributed to worrying about the future.  Patient appears stable on his current medication regimen and will continue taking his medications as prescribed.  Patient's medications to be prescribed to pharmacy of choice.  Collaboration of Care: Collaboration of Care: Medication Management AEB provider managing patient's psychiatric medications, Psychiatrist AEB patient being seen by a mental health provider at this facility, and Referral or follow-up with counselor/therapist AEB patient being seen by a therapist outside of this facility  Patient/Guardian was advised Release of Information must be obtained prior to any record release in order to collaborate their care with an outside provider. Patient/Guardian was advised if they have not already done so to contact the registration department to sign all necessary forms in order for [name] to release information regarding their care.   Consent: Patient/Guardian gives verbal consent for treatment and assignment of benefits for services provided during this visit. Patient/Guardian expressed understanding and agreed to proceed.   1. Generalized anxiety disorder  - sertraline (ZOLOFT) 100 MG tablet; Take 1 tablet (100 mg total) by mouth daily.  Dispense: 30 tablet; Refill: 3  2. Moderate episode of recurrent major depressive disorder  - sertraline (ZOLOFT) 100 MG tablet; Take 1 tablet (100 mg total) by mouth daily.  Dispense: 30 tablet; Refill: 3 - ARIPiprazole (ABILIFY) 2 MG tablet; Take 1 tablet (2 mg total) by mouth daily.  Dispense: 30 tablet; Refill: 3  Patient to follow-up in 3 months Provider spent a total of 15  minutes with the patient/reviewing patient's chart  Meta Hatchet, PA 09/05/2022, 4:16 PM

## 2022-10-16 ENCOUNTER — Ambulatory Visit: Payer: Self-pay | Attending: Internal Medicine | Admitting: Internal Medicine

## 2022-10-16 ENCOUNTER — Encounter: Payer: Self-pay | Admitting: Internal Medicine

## 2022-10-16 VITALS — BP 111/75 | HR 73 | Temp 97.5°F | Ht 71.0 in | Wt 161.0 lb

## 2022-10-16 DIAGNOSIS — Z1159 Encounter for screening for other viral diseases: Secondary | ICD-10-CM

## 2022-10-16 DIAGNOSIS — J4520 Mild intermittent asthma, uncomplicated: Secondary | ICD-10-CM

## 2022-10-16 DIAGNOSIS — F17200 Nicotine dependence, unspecified, uncomplicated: Secondary | ICD-10-CM

## 2022-10-16 DIAGNOSIS — F320 Major depressive disorder, single episode, mild: Secondary | ICD-10-CM

## 2022-10-16 DIAGNOSIS — F1921 Other psychoactive substance dependence, in remission: Secondary | ICD-10-CM

## 2022-10-16 DIAGNOSIS — Z114 Encounter for screening for human immunodeficiency virus [HIV]: Secondary | ICD-10-CM

## 2022-10-16 DIAGNOSIS — K5090 Crohn's disease, unspecified, without complications: Secondary | ICD-10-CM

## 2022-10-16 DIAGNOSIS — F411 Generalized anxiety disorder: Secondary | ICD-10-CM

## 2022-10-16 NOTE — Progress Notes (Signed)
Patient ID: Brendan Ochoa, male    DOB: 1975-08-14  MRN: [id number]  CC: Follow-up (Follow up. /No questions/ concerns/Discuss Tdap vax)   Subjective: Brendan Ochoa is a 47 y.o. male who presents for chronic ds management.  Last saw [name] 06/2020 His concerns today include:  Patient with history of polysubstance abuse (cocaine and EtOH listed on the chart), Crohn's disease, GERD, tobacco dependence, asthma, GAD, MDD  Patient was last seen in February 2022.  Patient states there was a lapse in care because he had relapsed into heavy drinking and using street drug mainly cocaine and marijuana.  Recently cleaned himself up and is now plugged in with behavioral health.  He has been clean of alcohol for 4 months and cocaine for 6 months.  Followed by [facility] behavioral health.  Currently on Abilify and Zoloft for depression and anxiety.  Reports he is doing well on the medicines.    Tob: He started smoking again since last visit with me but has been cutting back.  Now 1/2 pk a day.  Smoked for 20 yrs but had periods where he quit.  Has patches at home which he states he will use when he is ready to give a trial of quitting. Asthma:  no recent flares  Crohn's:  better since he stopped drinking Has to stay away from certain foods - red meat, fatty foods last colonoscopy was in 2019 and showed that his Crohn's was in remission.  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     Current Outpatient Medications on File Prior to Visit  Medication Sig Dispense Refill   albuterol (VENTOLIN HFA) 108 (90 Base) MCG/ACT inhaler Inhale 2 puffs into the lungs every 6 (six) hours as needed for wheezing or shortness of breath. 8 g 1   ARIPiprazole (ABILIFY) 2 MG tablet Take 1 tablet (2 mg total) by mouth daily. 30 tablet 3   sertraline (ZOLOFT) 100 MG tablet Take 1 tablet (100 mg total) by mouth daily. 30 tablet 3   traZODone (DESYREL) 50 MG tablet Take 1 tablet (50 mg total) by mouth at bedtime as needed for sleep. 30 tablet 1   disulfiram (ANTABUSE) 250 MG tablet Take 1 tablet (250 mg total) by mouth daily. (  Patient not taking: Reported on 10/16/2022) 30 tablet 0   Multiple Vitamin (MULTIVITAMIN WITH MINERALS) TABS tablet Take 1 tablet by mouth daily. (Patient not taking: Reported on 10/16/2022)     nicotine (NICODERM CQ - DOSED IN MG/24 HOURS) 14 mg/24hr patch Place 1 patch (14 mg total) onto the skin daily. (Patient not taking: Reported on 10/16/2022) 14 patch 0   No current facility-administered medications on file prior to visit.    No Known Allergies  Social History   Socioeconomic History   Marital status: Single    Spouse name: Not on file   Number of  children: Not on file   Years of education: Not on file   Highest education level: Not on file  Occupational History   Not on file  Tobacco Use   Smoking status: Every Day    Packs/day: 0.50    Years: 15.00    Additional pack years: 0.00    Total pack years: 7.50    Types: Cigarettes   Smokeless tobacco: Never   Tobacco comments:    1/4 to 1/2 pack per day   Vaping Use   Vaping Use: Never used  Substance and Sexual Activity   Alcohol use: Not Currently    Comment: alcoholic- last use 11/10/20   Drug use: Not Currently    Types: Cocaine, Marijuana, Methamphetamines    Comment: last cocaine use 10/10/20.  last meth april 2022   Sexual activity: Not on file  Other Topics Concern   Not on file  Social History Narrative   Not on file   Social Determinants of Health   Financial Resource Strain: Not on file  Food Insecurity: Not on file  Transportation Needs: Not on file  Physical Activity: Not on file  Stress: Not on file  Social Connections: Not on file  Intimate Partner Violence: Not on file    Family History  Problem Relation Age of Onset   Stomach cancer Maternal Grandfather    Colon cancer Neg Hx     Past Surgical History:  Procedure Laterality Date   BIOPSY  10/15/2017   Procedure: BIOPSY;  Surgeon: West Bali, MD;  [facility];  Service: Endoscopy;;  terminal ileum colon   COLONOSCOPY WITH PROPOFOL N/A 10/15/2017   redundant colon, normal rectum, external and internal hemorrhoids, quiescent crohn's. Pathology: inactive chronicn nonspecific ileitis   ESOPHAGOGASTRODUODENOSCOPY (EGD) WITH PROPOFOL N/A 10/15/2017   normal esophagus, small hiatal hernia, gastritis, duodenitis   FINGER AMPUTATION Right 2012   cut finger table saw and surgically amputated    ROS: Review of Systems Negative except as stated above  PHYSICAL EXAM: BP 111/75 (BP Location: Left Arm, Patient Position: Sitting, Cuff Size: Normal)   Pulse 73   Temp (!) 97.5 F (36.4 C)  (Oral)   Ht 5\' 11"  (1.803 m)   Wt 161 lb (73 kg)   SpO2 96%   BMI 22.45 kg/m   Physical Exam   General appearance - alert, well appearing, middle age Caucasian male and in no distress Mental status - normal mood, behavior, speech, dress, motor activity, and thought processes Neck - supple, no significant adenopathy Chest - clear to auscultation, no wheezes, rales or rhonchi, symmetric air entry Heart - normal rate, regular rhythm, normal S1, S2, no murmurs, rubs, clicks or gallops Extremities - peripheral pulses normal, no pedal edema, no clubbing or cyanosis    10/16/2022    3:14 PM 09/05/2022   11:06 AM 07/04/2022   11:13 AM  Depression screen PHQ 2/9  Decreased Interest 0 0 0  Down, Depressed, Hopeless 0 0 0  PHQ - 2 Score 0 0 0  Altered sleeping 1    Tired, decreased energy 1    Change in appetite 0    Feeling bad or failure about yourself  0    Trouble concentrating 1    Moving slowly or fidgety/restless 0    Suicidal thoughts 0    PHQ-9 Score 3         Latest Ref Rng & Units 05/03/2022    2:26 PM 12/01/2021   10:45 PM 07/25/2021    3:10 PM  CMP  Glucose 70 - 99 mg/dL 454  94  098   BUN 6 - 20 mg/dL 13  16  10    Creatinine 0.61 - 1.24 mg/dL 1.19  1.47  8.29   Sodium 135 - 145 mmol/L 136  140  135   Potassium 3.5 - 5.1 mmol/L 3.9  3.8  3.7   Chloride 98 - 111 mmol/L 102  106  99   CO2 22 - 32 mmol/L 22  23  20    Calcium 8.9 - 10.3 mg/dL 9.1  8.8  9.5   Total Protein 6.5 - 8.1 g/dL 8.1  6.8  7.5   Total Bilirubin 0.3 - 1.2 mg/dL 0.4  0.6  0.7   Alkaline Phos 38 - 126 U/L 74  68  67   AST 15 - 41 U/L 22  26  32   ALT 0 - 44 U/L 14  22  42    Lipid Panel     Component Value Date/Time   CHOL 245 (H) 05/03/2022 1426   TRIG 331 (H) 05/03/2022 1426   HDL 104 05/03/2022 1426   CHOLHDL 2.4 05/03/2022 1426   VLDL 66 (H) 05/03/2022 1426   LDLCALC 75 05/03/2022 1426   LDLDIRECT 103.4 (H) 04/27/2021 0046    CBC    Component Value Date/Time   WBC 7.5 05/03/2022  1426   RBC 5.55 05/03/2022 1426   HGB 17.3 (H) 05/03/2022 1426   HCT 48.9 05/03/2022 1426   PLT 292 05/03/2022 1426   MCV 88.1 05/03/2022 1426   MCH 31.2 05/03/2022 1426   MCHC 35.4 05/03/2022 1426   RDW 13.3 05/03/2022 1426   LYMPHSABS 2.8 05/03/2022 1426   MONOABS 0.5 05/03/2022 1426   EOSABS 0.1 05/03/2022 1426   BASOSABS 0.1 05/03/2022 1426    ASSESSMENT AND PLAN: 1. Polysubstance dependence, non-opioid, in remission (HCC) Commended him on this.  Encouraged him to remain free of street drugs.  2. Tobacco dependence Advised to quit.  He is aware of health risks associated with smoking.  Patient states he has nicotine patches and will use them and he is ready to give a trial of quitting.  3. Crohn's disease without complication, unspecified gastrointestinal tract location (HCC) In remission without need of medication.  4. Mild intermittent asthma without complication Continue to use albuterol inhaler when needed.  5. GAD (generalized anxiety disorder) 6. Major depressive disorder, single episode, mild (HCC) Plugged in with behavioral health.  7. Need for hepatitis C screening test Patient agreeable to screening. - Hepatitis C Antibody  8. Screening for HIV (human immunodeficiency virus) Patient agreeable to screening. - HIV antibody (with reflex)    Patient was given the opportunity to ask questions.  Patient verbalized understanding of the plan and was able to repeat key elements of the plan.   This documentation was completed using  Paediatric nurse.  Any transcriptional errors are unintentional.  No orders of the defined types were placed in this encounter.    Requested Prescriptions    No prescriptions requested or ordered in this encounter    No follow-ups on file.  Jonah Blue, MD, FACP

## 2022-10-17 LAB — HIV ANTIBODY (ROUTINE TESTING W REFLEX): HIV Screen 4th Generation wRfx: NONREACTIVE

## 2022-10-17 LAB — HEPATITIS C ANTIBODY: Hep C Virus Ab: NONREACTIVE

## 2022-11-02 ENCOUNTER — Ambulatory Visit (HOSPITAL_COMMUNITY)
Admission: EM | Admit: 2022-11-02 | Discharge: 2022-11-02 | Disposition: A | Discharge status: Home / Self Care | Payer: No Payment, Other | Attending: Psychiatry | Admitting: Psychiatry

## 2022-11-02 DIAGNOSIS — F419 Anxiety disorder, unspecified: Secondary | ICD-10-CM | POA: Insufficient documentation

## 2022-11-02 DIAGNOSIS — F1010 Alcohol abuse, uncomplicated: Secondary | ICD-10-CM | POA: Insufficient documentation

## 2022-11-02 DIAGNOSIS — R450 Nervousness: Secondary | ICD-10-CM | POA: Insufficient documentation

## 2022-11-02 DIAGNOSIS — F32A Depression, unspecified: Secondary | ICD-10-CM | POA: Insufficient documentation

## 2022-11-02 NOTE — Progress Notes (Signed)
   11/02/22 1239  [facility] Triage Screening (Walk-ins at [facility] only)  How Did You Hear About [name]? Self  What Is the Reason for Your Visit/Call Today? Pt is a 47 yo male who presents to [facility] seeking detox. OPt reports that he has been drinking alcohol daily "for a long time" but for the last month he has been drinking more due to a recent break up with his girlfriend. Pt reports that heis up to consuming about a case of  15 beers daily. Pt stated he has never had seizures with withdrawal from alcohol but often gets shakes and nausea/vomiting if he is not drinking consistently. Pt denied SI or any past attempts to kill himself, HI, NSSH, AVH or paranoia. Pt stated that he smokes marijuana at least 2-3 times a week.  Pt stated that he last drank alcohol approxiametly an hour ago. Pt denied any hx of seizures with withdrawal. Pt stated he sees providers associated with [facility] and [address].  How Long Has This Been Causing You Problems? > than 6 months  Have You Recently Had Any Thoughts About Hurting Yourself? No  Are You Planning to Commit Suicide/Harm Yourself At This time? No  Have you Recently Had Thoughts About Hurting Someone [name]? No  Are You Planning To Harm Someone At This Time? No  Are you currently experiencing any auditory, visual or other hallucinations? No  Have You Used Any Alcohol or Drugs in the Past 24 Hours? Yes  How long ago did you use Drugs or Alcohol? one hour ago  What Did You Use and How Much? Alcohol - couple beers  Do you have any current medical co-morbidities that require immediate attention? No  Clinician description of patient physical appearance/behavior: Pt was alert and cooperative. Pt is dressed unremarkably, alert, oriented x4 with normal speech and normal motor behavior. Eye contact is good. Pt's mood is depressed, and affect is anxious. Thought process is coherent and relevant. Pt's insight is fair and judgement is poor. There is no indication pt is  currently responding to internal stimuli or experiencing delusional thought content. Pt was cooperative throughout assessment.  What Do You Feel Would Help You the Most Today? Alcohol or Drug Use Treatment  If access to [facility] was not available, would you have sought care in the Emergency Department? Yes  Determination of Need Routine (7 days)  Options For Referral Outpatient Therapy;Medication Management;Chemical Dependency Intensive Outpatient Therapy (CDIOP)    Flowsheet Row ED from 11/02/2022 in [facility] Clinical Support from 09/05/2022 in [facility] Clinical Support from 07/04/2022 in [facility]  C-SSRS RISK CATEGORY No Risk No Risk No Risk

## 2022-11-02 NOTE — ED Provider Notes (Signed)
Behavioral Health Urgent Care Medical Screening Exam  Patient Name: Brendan Ochoa MRN: [id number] Date of Evaluation: 11/02/22 Chief Complaint:   Diagnosis:  Final diagnoses:  Alcohol abuse    History of Present illness: Brendan Ochoa is a 47 y.o. male.  Presents to [facility] urgent care seeking detox from alcohol.  States drinking beer daily.  Reports he last drank a few hours prior to coming in.  Reports recent break-up between him and his significant other.  States " my family is tired of me and wants me to get help."  denied suicidal or homicidal ideations.  Denies auditory visual hallucinations.  States he is followed by outpatient providers through [facility] outpatient services.  He reports he has detoxed here in the past.  Denied history with delirium tremors or alcohol withdrawal seizures.    [name] is alert/oriented x 4; calm/slightly irritable; and mood congruent with affect. He is speaking in a clear tone at moderate volume, and normal pace; with good eye contact.  His  thought process is coherent and relevant;  "why did not you I will tell you all had no beds available before you all gave me a armband." there is no indication that he is currently responding to internal/external stimuli or experiencing delusional thought content; and he has denied suicidal/self-harm/homicidal ideation, psychosis, and paranoia.  Patient has remained calm throughout assessment and has answered questions appropriately.    Plan: This provider contacted CSW for additional outpatient resources advised patient to follow-up with [facility] and or [facility]. Patient declined outpatient resources.   At this time [name] is educated and verbalizes understanding of mental health resources and other crisis services in the community. he is instructed to call 911 and present to the nearest emergency room should he experience any suicidal/homicidal ideation, auditory/visual/hallucinations, or  detrimental worsening of his mental health condition.He was a also advised by Clinical research associate that he could call the toll-free phone on insurance card to assist with identifying in network counselors and agencies or number on back of Medicaid card t speak with care coordinator      Flowsheet Row Clinical Support from 09/05/2022 in [facility] Clinical Support from 07/04/2022 in [facility] Office Visit from 05/23/2022 in [facility]  C-SSRS RISK CATEGORY No Risk No Risk No Risk       Psychiatric Specialty Exam  Presentation  General Appearance:Appropriate for Environment; Casual  Eye Contact:Good  Speech:Clear and Coherent; Normal Rate  Speech Volume:Normal  Handedness:Right   Mood and Affect  Mood: Euthymic  Affect: Congruent; Appropriate   Thought Process  Thought Processes: Coherent; Goal Directed  Descriptions of Associations:Intact  Orientation:Full (Time, Place and Person)  Thought Content:Logical; WDL  Diagnosis of Schizophrenia or Schizoaffective disorder in past: No data recorded  Hallucinations:None  Ideas of Reference:None  Suicidal Thoughts:No  Homicidal Thoughts:No   Sensorium  Memory: Immediate Fair; Recent Fair  Judgment: Fair  Insight: Fair   Art therapist  Concentration: Good  Attention Span: Good  Recall: Good  Fund of Knowledge: Good  Language: Good   Psychomotor Activity  Psychomotor Activity: Normal   Assets  Assets: Communication Skills; Desire for Improvement; Financial Resources/Insurance; Physical Health; Resilience; Social Support   Sleep  Sleep: Good  Number of hours:  3.5   Physical Exam: Physical Exam Vitals and nursing note reviewed.  Constitutional:      Appearance: Normal appearance.  Cardiovascular:     Rate and Rhythm: Normal  rate and regular rhythm.  Neurological:     Mental Status: He is alert and  oriented to person, place, and time.  Psychiatric:        Mood and Affect: Mood normal.        Behavior: Behavior normal.    Review of Systems  Psychiatric/Behavioral:  Positive for depression and substance abuse. The patient is nervous/anxious.   All other systems reviewed and are negative.  Blood pressure (!) 131/97, pulse (!) 108, temperature 98 F (36.7 C), temperature source Oral, resp. rate 18, SpO2 97 %. There is no height or weight on file to calculate BMI.  Musculoskeletal: Strength & Muscle Tone: within normal limits Gait & Station: normal Patient leans: N/A   [facility] MSE Discharge Disposition for Follow up and Recommendations: Based on my evaluation the patient does not appear to have an emergency medical condition and can be discharged with resources and follow up care in outpatient services for Substance Abuse Intensive Outpatient Program   Oneta Rack, NP 11/02/2022, 12:42 PM

## 2022-11-02 NOTE — Discharge Instructions (Addendum)
.. OBS Care Management   Base on the information you have provided and the presenting issue, outpatient services and resources for have been recommended.  It is imperative that you follow through with treatment recommendations within 5-7 days from the of discharge to mitigate further risk to your safety and mental well-being. A list of referrals has been provided below to get you started.  You are not limited to the list provided.  In case of an urgent crisis, you may contact the Mobile Crisis Unit with [facility] at [phone].    Residential Treatment  Facilities Medicaid Detox No Insurance Private Insurance   [facility] [address] [place], [place] [phone] or [phone]   No  Yes  Yes  Yes   [facility] [address] [place], [place] [address] [phone] Admissions: 8am-3pm  M-F   [name] only  No  Yes  No    [facility] [phone]   No  Yes  No- out of pocket 16,000  Yes   [facility]) [address] [place], [place] [phone]   Yes- No medicare  Yes   Yes, [place]   BCBS only    [facility] [address] [place], [place], [address] [phone]   No  No  Yes but private pay, offers some sponsorships  Does not take insurance    [facility] [place], [place] [phone]       No  No  Yes- [place] out of pocket if not in those counties. 3,220.00 for 28 days.   No   Residential Treatment  Facilities   Medicaid  Detox  No Insurance  Private Insurance   [facility] (multiple locations throughout the country)  Intake: [phone]    No   Yes   No   Yes   [facility]  [facility] [place], [place] [phone] (takes everyone as long as they meet detox criteria)   Yes  Yes   Yes  Yes   [facility]   [place], [place]  [phone] 27 locations    No  No- sober living house  90.00-130.00 per week per person  Will Madison  [place] [facility] [phone] [email or web address]  Tony Sowards  [place] [facility] [place] [facility] [phone] [email or web address]    No   [place] [facility] [address]  [place] [phone] [email or web address]     No  No  1,200.00 a 200.00 deposit is required at start of treatment Payment plans accepted **Christian based program  No   Residential Treatment  Facilities   Medicaid  Detox  No Insurance  Private Insurance   [facility] [place] [address]  [place], [address] [phone]  No  Yes       Yes  Regular Rehab: 7,500.00 28 days Dual Diagnosis: 8,900.00 28 days 7 day detox: 2.700.00 or 3,400.00 for Dual Diagnosis   Yes   Outpatient Treatment  Facilities Medicaid Detox No Insurance Private  Insurance   [facility] [address]  [place], , [address] [phone]   No  No  No  Yes    [facility]  (If substance abuse is secondary diagnosis) [address],  [place],  [address] [phone]   Yes-[place] and [place] Only for Partial   No   No   Yes- IOP  [facility]   [facility]  [address] [place], [place] [phone] (offers adult AND adolescent Intensive Outpatient services) (also in [place], [place], [place] and [place])      No No IOP- does some sponsorships on individual basis Yes   Outpatient Treatment  Facilities Medicaid Detox No Insurance Private  Insurance   [facility] [address]  [place], [place], [address] [phone]   Yes  They would go to ER at [facility] then be transferred to a detox unit    Yes- self pay     Yes    [facility] [address]  [place], [place], [address] And  [address],  [place], [place] [address] [phone]   Yes  No    Yes most qualify for state funding IOP and Opiod treatment- Offers Methadone  UHC, [employer] BCBS, [place]   [facility]  [phone]   No  Yes (in residential treatment program)   No   Insurance Only    [facility] [address] [place], [place],  [phone]   Yes but not for suboxone treatment  Yes- opiates with suboxone have to commit to 8 week IOP   Yes 595.00 for first visit  150.00 for prescription 150.00 a week after that for group    Yes   [facility] [address]  [place], [place] [phone]  No- has a waiting list about to be approved No Yes- but has to be self pay  500.00 for 1st 2 weeks  500 for next 2 weeks and  750 mo. Ongoing Yes   Outpatient Treatment  Facilities Medicaid Detox No Insurance Private  Insurance   [facility] [address] [place], [place] [phone]  No No Limited sponsorships IOP- 9,500.00 8-15 weeks If paid upfront gives a 500.00 deduction Outpatient- 1 day a week  9 weeks 4,500.00 has payment plans   Yes- out of network though   [facility] (Groups/Residential) [place], [place]  [phone]  Yes- [place] No IOP facility  Yes  No   [facility]  [address] [place], [place] [phone]  No No Out of pocket only- depends on the situation  Allow people to do services on a flexible  payment plan- billed every 90 days based on income  35.00 per group- 2 hour session  *DUI assessments  and *education for charges  *evaluations   No  Outpatient Treatment  Facilities Medicaid Detox No Insurance Private  Insurance   [facility]  [address]  [place], [place], [address] [phone]   Yes  No  Yes  Yes    Mobile Crisis: [facility]: [phone]  (For crisis response 24 hours a  day) [place] Hotline: 1-800-256-2452  

## 2022-12-05 ENCOUNTER — Telehealth (INDEPENDENT_AMBULATORY_CARE_PROVIDER_SITE_OTHER): Payer: No Payment, Other | Admitting: Physician Assistant

## 2022-12-05 ENCOUNTER — Other Ambulatory Visit: Payer: Self-pay

## 2022-12-05 DIAGNOSIS — F411 Generalized anxiety disorder: Secondary | ICD-10-CM

## 2022-12-05 DIAGNOSIS — F331 Major depressive disorder, recurrent, moderate: Secondary | ICD-10-CM | POA: Diagnosis not present

## 2022-12-05 MED ORDER — ARIPIPRAZOLE 5 MG PO TABS
5.0000 mg | ORAL_TABLET | Freq: Every day | ORAL | 2 refills | Status: DC
  Filled 2022-12-05 – 2022-12-17 (×2): qty 30, 30d supply, fill #0
  Filled 2023-01-18: qty 30, 30d supply, fill #1

## 2022-12-05 MED ORDER — SERTRALINE HCL 100 MG PO TABS
100.0000 mg | ORAL_TABLET | Freq: Every day | ORAL | 2 refills | Status: DC
  Filled 2022-12-05 – 2022-12-17 (×2): qty 30, 30d supply, fill #0
  Filled 2023-01-18: qty 30, 30d supply, fill #1

## 2022-12-06 ENCOUNTER — Encounter (HOSPITAL_COMMUNITY): Payer: Self-pay | Admitting: Physician Assistant

## 2022-12-06 NOTE — Progress Notes (Unsigned)
BH MD/PA/NP OP Progress Note  Virtual Visit via Video Note  I connected with Brendan Ochoa on 12/08/22 at 11:00 AM EDT by a video enabled telemedicine application and verified that I am speaking with the correct person using two identifiers.  Location: Patient: Home Provider: Clinic   I discussed the limitations of evaluation and management by telemedicine and the availability of in person appointments. The patient expressed understanding and agreed to proceed.  Follow Up Instructions:  I discussed the assessment and treatment plan with the patient. The patient was provided an opportunity to ask questions and all were answered. The patient agreed with the plan and demonstrated an understanding of the instructions.   The patient was advised to call back or seek an in-person evaluation if the symptoms worsen or if the condition fails to improve as anticipated.  I provided 12 minutes of non-face-to-face time during this encounter.  Meta Hatchet, PA    12/06/2022 6:12 PM Brendan Ochoa  MRN:  [id number]  Chief Complaint:  Chief Complaint  Patient presents with   Follow-up   Medication Management   HPI:   Brendan Ochoa is a 47 year old, Caucasian male with a past psychiatric history significant for generalized anxiety disorder, major depressive disorder and sleep disturbances who presents to [facility] via virtual video visit for follow-up and medication management.  Patient is currently being managed on the following psychiatric medications:  Trazodone 50 mg at bedtime Abilify 2 mg daily Sertraline 50 mg daily  Patient presents today encounter stating that he relapsed on alcohol roughly a month ago.  Patient reports that he drinks for a week straight following his break-up with his then girlfriend.  Patient states that he presented to [facility] to undergo detox from alcohol but states  that the facility had no available space.  Patient reports that he ended up detoxing at home.  While drinking alcohol, patient states that he took his medications regularly.  While consuming alcohol, patient reports that he could tell that his depression was coming back.  Patient has since stopped drinking alcohol and states that the last time he drank alcohol was roughly 5 weeks ago.  Patient states that he feels good but not as good as the last encounter.  Patient reports that he is still reeling over his break-up with his girlfriend.  He endorses minimal depression and states that he has been very steady since his break-up.  Patient endorses the following depressive symptoms: low mood and decreased energy.  Patient denies anxiety.  A PHQ-9 screen was performed with the patient scoring a 3.  A GAD-7 screen was also performed with the patient scoring a 2.  Patient is alert and oriented x 4, calm, cooperative, and fully engaged in conversation during the encounter.  Patient reports that his mood is getting better.  Patient denies suicidal or homicidal ideations.  He further denies auditory or visual hallucinations and does not appear to be responding to internal/external stimuli.  Patient endorses good sleep and receives on average 8 hours of sleep per night.  Patient endorses good appetite and eats on average 3 meals per day.  Patient reports that the last time she drank alcohol was roughly over a month ago.  Patient endorses tobacco use and smokes on average 1/2 pack/day.  Patient denies illicit drug use.  Visit Diagnosis:    ICD-10-CM   1. Moderate episode of recurrent major depressive disorder (HCC)  F33.1  ARIPiprazole (ABILIFY) 5 MG tablet    sertraline (ZOLOFT) 100 MG tablet    2. Generalized anxiety disorder  F41.1 sertraline (ZOLOFT) 100 MG tablet      Past Psychiatric History:  Patient endorses a past history of bipolar disorder. Patient has also been diagnosed with major depressive  disorder. Patient struggles with alcohol abuse and has also struggled with drug abuse in the past   Past Medical History:  Past Medical History:  Diagnosis Date   Asthma    Cocaine abuse (HCC)    in past   Crohn disease (HCC) 2008   Depression    in past, not current as of 2019    ETOH abuse    history of alcohol use   GERD (gastroesophageal reflux disease)     Past Surgical History:  Procedure Laterality Date   BIOPSY  10/15/2017   Procedure: BIOPSY;  Surgeon: West Bali, MD;  Location: [facility];  Service: Endoscopy;;  terminal ileum colon   COLONOSCOPY WITH PROPOFOL N/A 10/15/2017   redundant colon, normal rectum, external and internal hemorrhoids, quiescent crohn's. Pathology: inactive chronicn nonspecific ileitis   ESOPHAGOGASTRODUODENOSCOPY (EGD) WITH PROPOFOL N/A 10/15/2017   normal esophagus, small hiatal hernia, gastritis, duodenitis   FINGER AMPUTATION Right 2012   cut finger table saw and surgically amputated    Family Psychiatric History:  Grandfather (maternal) - Bipolar disorder   Patient reports that he is unsure of anyone else with psychiatric issues and states that his mother's side of the family has a history of mental illness.   Family history of suicide: Great uncle (maternal)   Patient denies a family history of homicide   Substance abuse, patient states that 90% of of his cousins either abused alcohol or drugs  Family History:  Family History  Problem Relation Age of Onset   Stomach cancer Maternal Grandfather    Colon cancer Neg Hx     Social History:  Social History   Socioeconomic History   Marital status: Single    Spouse name: Not on file   Number of children: Not on file   Years of education: Not on file   Highest education level: Not on file  Occupational History   Not on file  Tobacco Use   Smoking status: Every Day    Current packs/day: 0.50    Average packs/day: 0.5 packs/day for 15.0 years (7.5 ttl pk-yrs)    Types:  Cigarettes   Smokeless tobacco: Never   Tobacco comments:    1/4 to 1/2 pack per day   Vaping Use   Vaping status: Never Used  Substance and Sexual Activity   Alcohol use: Not Currently    Comment: alcoholic- last use 11/10/20   Drug use: Not Currently    Types: Cocaine, Marijuana, Methamphetamines    Comment: last cocaine use 10/10/20.  last meth april 2022   Sexual activity: Not on file  Other Topics Concern   Not on file  Social History Narrative   Not on file   Social Determinants of Health   Financial Resource Strain: Not on file  Food Insecurity: Not on file  Transportation Needs: Not on file  Physical Activity: Not on file  Stress: Not on file  Social Connections: Not on file    Allergies: No Known Allergies  Metabolic Disorder Labs: Lab Results  Component Value Date   HGBA1C 5.8 (H) 05/03/2022   MPG 120 05/03/2022   MPG 105.41 12/01/2021   Lab Results  Component Value  Date   PROLACTIN 4.0 05/03/2022   PROLACTIN 9.7 06/08/2021   Lab Results  Component Value Date   CHOL 245 (H) 05/03/2022   TRIG 331 (H) 05/03/2022   HDL 104 05/03/2022   CHOLHDL 2.4 05/03/2022   VLDL 66 (H) 05/03/2022   LDLCALC 75 05/03/2022   LDLCALC 65 12/01/2021   Lab Results  Component Value Date   TSH 0.940 05/03/2022   TSH 2.185 12/01/2021    Therapeutic Level Labs: No results found for: "LITHIUM" No results found for: "VALPROATE" No results found for: "CBMZ"  Current Medications: Current Outpatient Medications  Medication Sig Dispense Refill   albuterol (VENTOLIN HFA) 108 (90 Base) MCG/ACT inhaler Inhale 2 puffs into the lungs every 6 (six) hours as needed for wheezing or shortness of breath. 8 g 1   ARIPiprazole (ABILIFY) 5 MG tablet Take 1 tablet (5 mg total) by mouth daily. 30 tablet 2   Multiple Vitamin (MULTIVITAMIN WITH MINERALS) TABS tablet Take 1 tablet by mouth daily. (Patient not taking: Reported on 10/16/2022)     sertraline (ZOLOFT) 100 MG tablet Take 1 tablet  (100 mg total) by mouth daily. 30 tablet 2   traZODone (DESYREL) 50 MG tablet Take 1 tablet (50 mg total) by mouth at bedtime as needed for sleep. 30 tablet 1   No current facility-administered medications for this visit.     Musculoskeletal: Strength & Muscle Tone: within normal limits Gait & Station: normal Patient leans: N/A  Psychiatric Specialty Exam: Review of Systems  Psychiatric/Behavioral:  Negative for decreased concentration, dysphoric mood, hallucinations, self-injury, sleep disturbance and suicidal ideas. The patient is not nervous/anxious and is not hyperactive.     There were no vitals taken for this visit.There is no height or weight on file to calculate BMI.  General Appearance: Well Groomed  Eye Contact:  Good  Speech:  Clear and Coherent and Normal Rate  Volume:  Normal  Mood:   Mild depression  Affect:  Appropriate  Thought Process:  Coherent, Goal Directed, and Descriptions of Associations: Intact  Orientation:  Full (Time, Place, and Person)  Thought Content: WDL   Suicidal Thoughts:  No  Homicidal Thoughts:  No  Memory:  Immediate;   Good Recent;   Good Remote;   Fair  Judgement:  Good  Insight:  Good  Psychomotor Activity:  Normal  Concentration:  Concentration: Good and Attention Span: Good  Recall:  Good  Fund of Knowledge: Good  Language: Good  Akathisia:  No  Handed:  Right  AIMS (if indicated): not done  Assets:  Communication Skills Desire for Improvement Social Support Transportation  ADL's:  Intact  Cognition: WNL  Sleep:  Good   Screenings: GAD-7    Flowsheet Row Video Visit from 12/05/2022 in [facility] Clinical Support from 09/05/2022 in [facility] Clinical Support from 07/04/2022 in [facility] Office Visit from 06/14/2020 in [facility] Office Visit from 05/27/2020 in [facility]  Total GAD-7 Score 2 0 0 11 9      PHQ2-9    Flowsheet Row Video Visit from 12/05/2022 in [facility] Office Visit from 10/16/2022 in [facility] Clinical Support from 09/05/2022 in [facility] Clinical Support from 07/04/2022 in [facility] ED from 05/03/2022 in [facility]  PHQ-2 Total Score 2 0  0 0 3  PHQ-9 Total Score 3 3 -- -- 10      Flowsheet Row Video Visit from 12/05/2022 in [facility] ED from 11/02/2022 in [facility] Clinical Support from 09/05/2022 in [facility]  C-SSRS RISK CATEGORY No Risk No Risk No Risk        Assessment and Plan:   Brendan Ochoa is a 47 year old, Caucasian male with a past psychiatric history significant for generalized anxiety disorder, major depressive disorder and sleep disturbances who presents to [facility] via virtual video visit for follow-up and medication management.  Since the last encounter with this patient, patient reports that he had relapsed on alcohol following a break-up.  Although patient endorses relapsing, he reports that he took his medications regularly.  Patient states that he last drank 5 weeks ago and states that he has been doing fairly well.  Patient reports that he could feel his depression returned when he was drinking alcohol but currently endorses minimal depression at this time.  Patient denies anxiety.  Patient is open to adjusting his Abilify dosage for the management of his depressive symptoms.  Provider recommended increasing patient's Abilify from 2 mg to 5 mg daily for the management of his depressive symptoms.  Patient was agreeable to recommendation.  Patient to continue taking his sertraline as prescribed.  Patient's medications to be  e-prescribed to pharmacy of choice.  Collaboration of Care: Collaboration of Care: Medication Management AEB provider managing patient's psychiatric medications, Psychiatrist AEB patient being seen by a mental health provider at this facility, and Referral or follow-up with counselor/therapist AEB patient being seen by a therapist outside of this facility  Patient/Guardian was advised Release of Information must be obtained prior to any record release in order to collaborate their care with an outside provider. Patient/Guardian was advised if they have not already done so to contact the registration department to sign all necessary forms in order for [name] to release information regarding their care.   Consent: Patient/Guardian gives verbal consent for treatment and assignment of benefits for services provided during this visit. Patient/Guardian expressed understanding and agreed to proceed.   1. Moderate episode of recurrent major depressive disorder (HCC)  - ARIPiprazole (ABILIFY) 5 MG tablet; Take 1 tablet (5 mg total) by mouth daily.  Dispense: 30 tablet; Refill: 2 - sertraline (ZOLOFT) 100 MG tablet; Take 1 tablet (100 mg total) by mouth daily.  Dispense: 30 tablet; Refill: 2  2. Generalized anxiety disorder  - sertraline (ZOLOFT) 100 MG tablet; Take 1 tablet (100 mg total) by mouth daily.  Dispense: 30 tablet; Refill: 2  Patient to follow-up in 2 months Provider spent a total of 12 minutes with the patient/reviewing patient's chart  Meta Hatchet, PA 12/06/2022, 6:12 PM

## 2022-12-12 ENCOUNTER — Other Ambulatory Visit: Payer: Self-pay

## 2022-12-17 ENCOUNTER — Other Ambulatory Visit: Payer: Self-pay

## 2023-01-18 ENCOUNTER — Other Ambulatory Visit: Payer: Self-pay

## 2023-02-06 ENCOUNTER — Encounter (HOSPITAL_COMMUNITY): Payer: Self-pay | Admitting: Physician Assistant

## 2023-02-06 ENCOUNTER — Telehealth (HOSPITAL_COMMUNITY): Payer: No Payment, Other | Admitting: Physician Assistant

## 2023-02-06 DIAGNOSIS — F411 Generalized anxiety disorder: Secondary | ICD-10-CM | POA: Diagnosis not present

## 2023-02-06 DIAGNOSIS — F331 Major depressive disorder, recurrent, moderate: Secondary | ICD-10-CM

## 2023-02-06 MED ORDER — SERTRALINE HCL 100 MG PO TABS
100.0000 mg | ORAL_TABLET | Freq: Every day | ORAL | 2 refills | Status: DC
  Filled 2023-02-06 – 2023-03-25 (×2): qty 30, 30d supply, fill #0

## 2023-02-06 MED ORDER — ARIPIPRAZOLE 5 MG PO TABS
5.0000 mg | ORAL_TABLET | Freq: Every day | ORAL | 2 refills | Status: DC
  Filled 2023-02-06 – 2023-03-25 (×2): qty 30, 30d supply, fill #0

## 2023-02-06 NOTE — Progress Notes (Signed)
BH MD/PA/NP OP Progress Note  Virtual Visit via Video Note  I connected with Brendan Ochoa on 02/06/23 at 11:00 AM EDT by a video enabled telemedicine application and verified that I am speaking with the correct person using two identifiers.  Location: Patient: Home Provider: Clinic   I discussed the limitations of evaluation and management by telemedicine and the availability of in person appointments. The patient expressed understanding and agreed to proceed.  Follow Up Instructions:  I discussed the assessment and treatment plan with the patient. The patient was provided an opportunity to ask questions and all were answered. The patient agreed with the plan and demonstrated an understanding of the instructions.   The patient was advised to call back or seek an in-person evaluation if the symptoms worsen or if the condition fails to improve as anticipated.  I provided 9 minutes of non-face-to-face time during this encounter.  Meta Hatchet, PA    02/06/2023 6:11 PM Brendan Ochoa  MRN:  [id number]  Chief Complaint:  Chief Complaint  Patient presents with   Follow-up   Medication Refill   HPI:   Brendan Ochoa is a 47 year old, Caucasian male with a past psychiatric history significant for generalized anxiety disorder, major depressive disorder and sleep disturbances who presents to [facility] via virtual video visit for follow-up and medication management.  Patient is currently being managed on the following psychiatric medications:  Abilify 5 mg daily Sertraline 100 mg daily  Patient reports that his medication regimen has been working well.  Patient denies experiencing any adverse side effects and denies the need for dosage adjustments at this time.  Patient denies depressive symptoms at this time.  Patient endorses anxiety and rates his anxiety a 2-3 out of 10 but states that his symptoms are mostly manageable.   Patient endorses some work related stressors and states that he misses his ex-girlfriend a little.  Patient is currently living in [facility] and states that he has been living there for the past 2-1/2 months.  Patient is alert and oriented x 4, calm, cooperative, and fully engaged in conversation during the encounter.  Patient endorses great mood.  Patient denies suicidal or homicidal ideations.  He further denies auditory or visual hallucinations and does not appear to be responding to internal/external stimuli.  Patient endorses good sleep and receives on average 8 to 9 hours of sleep per night.  Patient endorses good appetite and eats on average 4-5 small meals per day.  Patient denies alcohol consumption.  Patient endorses tobacco use and smokes on average 1/2 pack/day.  Patient denies illicit drug use.  Visit Diagnosis:    ICD-10-CM   1. Moderate episode of recurrent major depressive disorder (HCC)  F33.1 ARIPiprazole (ABILIFY) 5 MG tablet    sertraline (ZOLOFT) 100 MG tablet    2. Generalized anxiety disorder  F41.1 sertraline (ZOLOFT) 100 MG tablet      Past Psychiatric History:  Patient endorses a past history of bipolar disorder. Patient has also been diagnosed with major depressive disorder. Patient struggles with alcohol abuse and has also struggled with drug abuse in the past   Past Medical History:  Past Medical History:  Diagnosis Date   Asthma    Cocaine abuse (HCC)    in past   Crohn disease (HCC) 2008   Depression    in past, not current as of 2019    ETOH abuse    history of alcohol use  GERD (gastroesophageal reflux disease)     Past Surgical History:  Procedure Laterality Date   BIOPSY  10/15/2017   Procedure: BIOPSY;  Surgeon: West Bali, MD;  [facility];  Service: Endoscopy;;  terminal ileum colon   COLONOSCOPY WITH PROPOFOL N/A 10/15/2017   redundant colon, normal rectum, external and internal hemorrhoids, quiescent crohn's. Pathology:  inactive chronicn nonspecific ileitis   ESOPHAGOGASTRODUODENOSCOPY (EGD) WITH PROPOFOL N/A 10/15/2017   normal esophagus, small hiatal hernia, gastritis, duodenitis   FINGER AMPUTATION Right 2012   cut finger table saw and surgically amputated    Family Psychiatric History:  Grandfather (maternal) - Bipolar disorder   Patient reports that he is unsure of anyone else with psychiatric issues and states that his mother's side of the family has a history of mental illness.   Family history of suicide: Great uncle (maternal)   Patient denies a family history of homicide   Substance abuse, patient states that 90% of of his cousins either abused alcohol or drugs  Family History:  Family History  Problem Relation Age of Onset   Stomach cancer Maternal Grandfather    Colon cancer Neg Hx     Social History:  Social History   Socioeconomic History   Marital status: Single    Spouse name: Not on file   Number of children: Not on file   Years of education: Not on file   Highest education level: Not on file  Occupational History   Not on file  Tobacco Use   Smoking status: Every Day    Current packs/day: 0.50    Average packs/day: 0.5 packs/day for 15.0 years (7.5 ttl pk-yrs)    Types: Cigarettes   Smokeless tobacco: Never   Tobacco comments:    1/4 to 1/2 pack per day   Vaping Use   Vaping status: Never Used  Substance and Sexual Activity   Alcohol use: Not Currently    Comment: alcoholic- last use 11/10/20   Drug use: Not Currently    Types: Cocaine, Marijuana, Methamphetamines    Comment: last cocaine use 10/10/20.  last meth april 2022   Sexual activity: Not on file  Other Topics Concern   Not on file  Social History Narrative   Not on file   Social Determinants of Health   Financial Resource Strain: Not on file  Food Insecurity: Not on file  Transportation Needs: Not on file  Physical Activity: Not on file  Stress: Not on file  Social Connections: Not on file     Allergies: No Known Allergies  Metabolic Disorder Labs: Lab Results  Component Value Date   HGBA1C 5.8 (H) 05/03/2022   MPG 120 05/03/2022   MPG 105.41 12/01/2021   Lab Results  Component Value Date   PROLACTIN 4.0 05/03/2022   PROLACTIN 9.7 06/08/2021   Lab Results  Component Value Date   CHOL 245 (H) 05/03/2022   TRIG 331 (H) 05/03/2022   HDL 104 05/03/2022   CHOLHDL 2.4 05/03/2022   VLDL 66 (H) 05/03/2022   LDLCALC 75 05/03/2022   LDLCALC 65 12/01/2021   Lab Results  Component Value Date   TSH 0.940 05/03/2022   TSH 2.185 12/01/2021    Therapeutic Level Labs: No results found for: "LITHIUM" No results found for: "VALPROATE" No results found for: "CBMZ"  Current Medications: Current Outpatient Medications  Medication Sig Dispense Refill   albuterol (VENTOLIN HFA) 108 (90 Base) MCG/ACT inhaler Inhale 2 puffs into the lungs every 6 (six) hours  as needed for wheezing or shortness of breath. 8 g 1   ARIPiprazole (ABILIFY) 5 MG tablet Take 1 tablet (5 mg total) by mouth daily. 30 tablet 2   Multiple Vitamin (MULTIVITAMIN WITH MINERALS) TABS tablet Take 1 tablet by mouth daily. (Patient not taking: Reported on 10/16/2022)     sertraline (ZOLOFT) 100 MG tablet Take 1 tablet (100 mg total) by mouth daily. 30 tablet 2   traZODone (DESYREL) 50 MG tablet Take 1 tablet (50 mg total) by mouth at bedtime as needed for sleep. 30 tablet 1   No current facility-administered medications for this visit.     Musculoskeletal: Strength & Muscle Tone: within normal limits Gait & Station: normal Patient leans: N/A  Psychiatric Specialty Exam: Review of Systems  Psychiatric/Behavioral:  Negative for decreased concentration, dysphoric mood, hallucinations, self-injury, sleep disturbance and suicidal ideas. The patient is not nervous/anxious and is not hyperactive.     There were no vitals taken for this visit.There is no height or weight on file to calculate BMI.  General  Appearance: Well Groomed  Eye Contact:  Good  Speech:  Clear and Coherent and Normal Rate  Volume:  Normal  Mood:  Euthymic  Affect:  Appropriate  Thought Process:  Coherent, Goal Directed, and Descriptions of Associations: Intact  Orientation:  Full (Time, Place, and Person)  Thought Content: WDL   Suicidal Thoughts:  No  Homicidal Thoughts:  No  Memory:  Immediate;   Good Recent;   Good Remote;   Fair  Judgement:  Good  Insight:  Good  Psychomotor Activity:  Normal  Concentration:  Concentration: Good and Attention Span: Good  Recall:  Good  Fund of Knowledge: Good  Language: Good  Akathisia:  No  Handed:  Right  AIMS (if indicated): not done  Assets:  Communication Skills Desire for Improvement Social Support Transportation  ADL's:  Intact  Cognition: WNL  Sleep:  Good   Screenings: GAD-7    Flowsheet Row Video Visit from 02/06/2023 in [facility] Video Visit from 12/05/2022 in [facility] Clinical Support from 09/05/2022 in [facility] Clinical Support from 07/04/2022 in [facility] Office Visit from 06/14/2020 in [facility]  Total GAD-7 Score 1 2 0 0 11      PHQ2-9    Flowsheet Row Video Visit from 02/06/2023 in [facility] Video Visit from 12/05/2022 in [facility] Office Visit from 10/16/2022 in [facility] Clinical Support from 09/05/2022 in [facility] Clinical Support from 07/04/2022 in [facility]  PHQ-2 Total Score 0 2 0 0 0  PHQ-9 Total Score -- 3 3 -- --      Flowsheet Row Video Visit from 02/06/2023 in [facility] Video Visit from 12/05/2022 in [facility] ED from 11/02/2022 in [facility]  C-SSRS RISK CATEGORY No Risk No Risk No Risk        Assessment and Plan:   Brendan Ochoa is a 47 year old, Caucasian male with a past psychiatric history significant for generalized anxiety disorder, major depressive disorder and sleep disturbances who presents to [facility] via virtual video visit for follow-up and medication management.  Patient reports no issues or concerns regarding his current medication regimen.  Patient denies depressive symptoms at  this time and states that his anxiety has been manageable at this time.  Patient endorses stability on his current medication regimen and denies the need for dosage adjustments at this time.  Patient's medications to be e-prescribed to pharmacy of choice.  Collaboration of Care: Collaboration of Care: Medication Management AEB provider managing patient's psychiatric medications, Psychiatrist AEB patient being seen by a mental health provider at this facility, and Referral or follow-up with counselor/therapist AEB patient being seen by a therapist outside of this facility  Patient/Guardian was advised Release of Information must be obtained prior to any record release in order to collaborate their care with an outside provider. Patient/Guardian was advised if they have not already done so to contact the registration department to sign all necessary forms in order for [name] to release information regarding their care.   Consent: Patient/Guardian gives verbal consent for treatment and assignment of benefits for services provided during this visit. Patient/Guardian expressed understanding and agreed to proceed.   1. Moderate episode of recurrent major depressive disorder (HCC)  - ARIPiprazole (ABILIFY) 5 MG tablet; Take 1 tablet (5 mg total) by mouth daily.  Dispense: 30 tablet; Refill: 2 - sertraline (ZOLOFT) 100 MG tablet; Take 1 tablet (100 mg total) by mouth daily.  Dispense: 30 tablet;  Refill: 2  2. Generalized anxiety disorder  - sertraline (ZOLOFT) 100 MG tablet; Take 1 tablet (100 mg total) by mouth daily.  Dispense: 30 tablet; Refill: 2  Patient to follow-up in 2 months Provider spent a total of 9 minutes with the patient/reviewing patient's chart  Meta Hatchet, PA 02/06/2023, 6:11 PM

## 2023-02-07 ENCOUNTER — Other Ambulatory Visit: Payer: Self-pay

## 2023-02-12 ENCOUNTER — Encounter: Payer: Self-pay | Admitting: Internal Medicine

## 2023-02-12 ENCOUNTER — Other Ambulatory Visit: Payer: Self-pay

## 2023-02-12 ENCOUNTER — Ambulatory Visit: Payer: Self-pay | Attending: Internal Medicine | Admitting: Internal Medicine

## 2023-02-12 VITALS — BP 120/80 | HR 104 | Wt 160.6 lb

## 2023-02-12 DIAGNOSIS — R0981 Nasal congestion: Secondary | ICD-10-CM

## 2023-02-12 MED ORDER — LORATADINE 10 MG PO TABS
10.0000 mg | ORAL_TABLET | Freq: Every day | ORAL | 1 refills | Status: DC
  Filled 2023-02-12: qty 30, 30d supply, fill #0

## 2023-02-12 MED ORDER — FLUTICASONE PROPIONATE 50 MCG/ACT NA SUSP
1.0000 | Freq: Every day | NASAL | 0 refills | Status: DC
  Filled 2023-02-12: qty 16, 30d supply, fill #0

## 2023-02-12 NOTE — Patient Instructions (Signed)
Use the Flonase nasal spray daily for 1 week and then after that as needed.  Take the Claritin daily.  If no improvement in symptoms please let me know or follow-up with [name].

## 2023-02-12 NOTE — Progress Notes (Signed)
Patient ID: TOA MIA, male    DOB: 1976-03-03  MRN: [id number]  CC: Sinus Problem   Subjective: Brendan Ochoa is a 47 y.o. male who presents for UC visit His concerns today include:  Patient with history of polysubstance abuse (cocaine and EtOH listed on the chart), Crohn's disease, GERD, tobacco dependence, asthma, GAD, MDD   Discussed the use of AI scribe software for clinical note transcription with the patient, who gave verbal consent to proceed.  History of Present Illness   The patient presents with a month-long history of sinus pressure and headache. The pressure is predominantly felt in the maxillary sinuses, more so on the left than the right. The headache is located above the eye. He also reports nasal congestion and mucus production, which is occasionally yellow. He has been using a generic over-the-counter cold and sinus medication, which provides some relief and aids sleep. The patient denies fever and sore throat but reports postnasal drainage. He has a history of a broken nasal bridge, which has resulted in a deviated septum and a smaller left nasal opening.      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   Epididymal cyst 06/14/2020   Unintentional weight loss 06/14/2020   Abnormal chest x-ray 06/14/2020   Elevated blood pressure reading without diagnosis of hypertension 05/27/2020   Influenza vaccination declined 05/27/2020   AKI (acute  kidney injury) (HCC) 01/01/2020   Vomiting 01/01/2020   Cocaine abuse (HCC) 01/01/2020   Alcohol abuse 01/01/2020   Ileitis 02/18/2018   HYPERGLYCEMIA 11/01/2009   Tobacco dependence 07/18/2007   SUBSTANCE ABUSE, MULTIPLE 07/18/2007   Allergic rhinitis 07/18/2007   Asthma 07/18/2007   GERD 07/18/2007   Crohn's disease (HCC) 07/18/2007     Current Outpatient Medications on File Prior to Visit  Medication Sig Dispense Refill   albuterol (VENTOLIN HFA) 108 (90 Base) MCG/ACT inhaler Inhale 2 puffs into the lungs every 6 (six) hours as needed for wheezing or shortness of breath. 8 g 1   ARIPiprazole (ABILIFY) 5 MG tablet Take 1 tablet (5 mg total) by mouth daily. 30 tablet 2   Multiple Vitamin (MULTIVITAMIN WITH MINERALS) TABS tablet Take 1 tablet by mouth daily.     sertraline (ZOLOFT) 100 MG tablet Take 1 tablet (100 mg total) by mouth daily. 30 tablet 2   traZODone (DESYREL) 50 MG tablet Take 1 tablet (50 mg total) by mouth at bedtime as needed for sleep. 30 tablet 1   No current facility-administered medications on file prior to visit.    No Known Allergies  Social History   Socioeconomic History   Marital status: Single    Spouse name: Not on file   Number of children: Not on file   Years of education: Not on file   Highest education level: Not on file  Occupational History   Not on file  Tobacco Use   Smoking status: Every Day    Current packs/day: 0.50  Average packs/day: 0.5 packs/day for 15.0 years (7.5 ttl pk-yrs)    Types: Cigarettes   Smokeless tobacco: Never   Tobacco comments:    1/4 to 1/2 pack per day   Vaping Use   Vaping status: Never Used  Substance and Sexual Activity   Alcohol use: Not Currently    Comment: alcoholic- last use 11/10/20   Drug use: Not Currently    Types: Cocaine, Marijuana, Methamphetamines    Comment: last cocaine use 10/10/20.  last meth april 2022   Sexual activity: Not on file  Other Topics Concern   Not on file  Social  History Narrative   Not on file   Social Determinants of Health   Financial Resource Strain: Not on file  Food Insecurity: Not on file  Transportation Needs: Not on file  Physical Activity: Not on file  Stress: Not on file  Social Connections: Not on file  Intimate Partner Violence: Not on file    Family History  Problem Relation Age of Onset   Stomach cancer Maternal Grandfather    Colon cancer Neg Hx     Past Surgical History:  Procedure Laterality Date   BIOPSY  10/15/2017   Procedure: BIOPSY;  Surgeon: West Bali, MD;  [facility];  Service: Endoscopy;;  terminal ileum colon   COLONOSCOPY WITH PROPOFOL N/A 10/15/2017   redundant colon, normal rectum, external and internal hemorrhoids, quiescent crohn's. Pathology: inactive chronicn nonspecific ileitis   ESOPHAGOGASTRODUODENOSCOPY (EGD) WITH PROPOFOL N/A 10/15/2017   normal esophagus, small hiatal hernia, gastritis, duodenitis   FINGER AMPUTATION Right 2012   cut finger table saw and surgically amputated    ROS: Review of Systems Negative except as stated above  PHYSICAL EXAM: BP 120/80 (BP Location: Right Arm, Patient Position: Sitting, Cuff Size: Normal)   Pulse (!) 104   Wt 160 lb 9.6 oz (72.8 kg)   SpO2 93%   BMI 22.40 kg/m   Physical Exam Physical Exam   HEENT: Nasal congestion noted, more pronounced on the right side. Deviated nasal septum towards the left side. Tenderness upon palpation of the maxillary sinuses, more pronounced on the left side. Tenderness upon palpation above the eyebrows.         Latest Ref Rng & Units 05/03/2022    2:26 PM 12/01/2021   10:45 PM 07/25/2021    3:10 PM  CMP  Glucose 70 - 99 mg/dL 536  94  644   BUN 6 - 20 mg/dL 13  16  10    Creatinine 0.61 - 1.24 mg/dL 0.34  7.42  5.95   Sodium 135 - 145 mmol/L 136  140  135   Potassium 3.5 - 5.1 mmol/L 3.9  3.8  3.7   Chloride 98 - 111 mmol/L 102  106  99   CO2 22 - 32 mmol/L 22  23  20    Calcium 8.9 - 10.3 mg/dL 9.1   8.8  9.5   Total Protein 6.5 - 8.1 g/dL 8.1  6.8  7.5   Total Bilirubin 0.3 - 1.2 mg/dL 0.4  0.6  0.7   Alkaline Phos 38 - 126 U/L 74  68  67   AST 15 - 41 U/L 22  26  32   ALT 0 - 44 U/L 14  22  42    Lipid Panel     Component Value Date/Time   CHOL 245 (H) 05/03/2022 1426   TRIG 331 (H) 05/03/2022 1426   HDL 104 05/03/2022 1426  CHOLHDL 2.4 05/03/2022 1426   VLDL 66 (H) 05/03/2022 1426   LDLCALC 75 05/03/2022 1426   LDLDIRECT 103.4 (H) 04/27/2021 0046    CBC    Component Value Date/Time   WBC 7.5 05/03/2022 1426   RBC 5.55 05/03/2022 1426   HGB 17.3 (H) 05/03/2022 1426   HCT 48.9 05/03/2022 1426   PLT 292 05/03/2022 1426   MCV 88.1 05/03/2022 1426   MCH 31.2 05/03/2022 1426   MCHC 35.4 05/03/2022 1426   RDW 13.3 05/03/2022 1426   LYMPHSABS 2.8 05/03/2022 1426   MONOABS 0.5 05/03/2022 1426   EOSABS 0.1 05/03/2022 1426   BASOSABS 0.1 05/03/2022 1426    ASSESSMENT AND PLAN: Sinus Congestion -Start Flonase nasal spray once daily for 1 week, then as needed to avoid potential nosebleeds. -Start Claritin once daily. -If symptoms persist, we will consider antibiotics or referral to an Ear, Nose, and Throat specialist. Imaging studies may be necessary.   Patient was given the opportunity to ask questions.  Patient verbalized understanding of the plan and was able to repeat key elements of the plan.   This documentation was completed using Paediatric nurse.  Any transcriptional errors are unintentional.  No orders of the defined types were placed in this encounter.    Requested Prescriptions    No prescriptions requested or ordered in this encounter    No follow-ups on file.  Jonah Blue, MD, FACP

## 2023-03-25 ENCOUNTER — Other Ambulatory Visit: Payer: Self-pay

## 2023-04-10 ENCOUNTER — Other Ambulatory Visit: Payer: Self-pay

## 2023-04-10 ENCOUNTER — Telehealth (HOSPITAL_COMMUNITY): Payer: No Payment, Other | Admitting: Physician Assistant

## 2023-04-10 ENCOUNTER — Encounter (HOSPITAL_COMMUNITY): Payer: Self-pay | Admitting: Physician Assistant

## 2023-04-10 DIAGNOSIS — F411 Generalized anxiety disorder: Secondary | ICD-10-CM | POA: Diagnosis not present

## 2023-04-10 DIAGNOSIS — F331 Major depressive disorder, recurrent, moderate: Secondary | ICD-10-CM | POA: Diagnosis not present

## 2023-04-10 MED ORDER — SERTRALINE HCL 100 MG PO TABS
100.0000 mg | ORAL_TABLET | Freq: Every day | ORAL | 2 refills | Status: DC
  Filled 2023-04-10 – 2023-06-13 (×2): qty 30, 30d supply, fill #0

## 2023-04-10 MED ORDER — ARIPIPRAZOLE 5 MG PO TABS
5.0000 mg | ORAL_TABLET | Freq: Every day | ORAL | 2 refills | Status: DC
  Filled 2023-04-10 – 2023-06-13 (×2): qty 30, 30d supply, fill #0

## 2023-04-10 NOTE — Progress Notes (Cosign Needed)
BH MD/PA/NP OP Progress Note  Virtual Visit via Video Note  I connected with Brendan Ochoa on 04/10/23 at 11:00 AM EST by a video enabled telemedicine application and verified that I am speaking with the correct person using two identifiers.  Location: Patient: Home Provider: Clinic   I discussed the limitations of evaluation and management by telemedicine and the availability of in person appointments. The patient expressed understanding and agreed to proceed.  Follow Up Instructions:  I discussed the assessment and treatment plan with the patient. The patient was provided an opportunity to ask questions and all were answered. The patient agreed with the plan and demonstrated an understanding of the instructions.   The patient was advised to call back or seek an in-person evaluation if the symptoms worsen or if the condition fails to improve as anticipated.  I provided 10 minutes of non-face-to-face time during this encounter.  Meta Hatchet, PA    04/10/2023 12:56 PM Brendan Ochoa  MRN:  [id number]  Chief Complaint:  Chief Complaint  Patient presents with   Follow-up   Medication Refill   HPI:   Brendan Ochoa is a 47 year old, Caucasian male with a past psychiatric history significant for generalized anxiety disorder, major depressive disorder and sleep disturbances who presents to [facility] via virtual video visit for follow-up and medication management.  Patient is currently being managed on the following psychiatric medications:  Abilify 5 mg daily Sertraline 100 mg daily  Patient reports that his medications are not giving him any trouble.  Patient reports that his medications continue to work well.  He endorses occasional depressive symptoms but denies them being impactful.  Patient endorses occasional anxiety but states that it has been manageable as of late.  Patient reports that he keeps his mind occupied by  going to work.  Patient denies any stressors at this time.  A GAD-7 screen was performed with the patient scoring a 2.  Patient is alert and oriented x 4, calm, cooperative, and fully engaged in conversation during the encounter.  Patient endorses great mood.  Patient denies suicidal or homicidal ideations.  He further denies auditory or visual hallucinations and does not appear to be responding to internal/external stimuli.  Patient endorses good sleep and receives on average 9 to 10 hours of sleep per night.  Patient endorses good appetite and eats on average 4-5 meals per day.  Patient denies alcohol consumption, tobacco use, or illicit drug use.  Visit Diagnosis:    ICD-10-CM   1. Moderate episode of recurrent major depressive disorder (HCC)  F33.1 ARIPiprazole (ABILIFY) 5 MG tablet    sertraline (ZOLOFT) 100 MG tablet    2. Generalized anxiety disorder  F41.1 sertraline (ZOLOFT) 100 MG tablet      Past Psychiatric History:  Patient endorses a past history of bipolar disorder. Patient has also been diagnosed with major depressive disorder. Patient struggles with alcohol abuse and has also struggled with drug abuse in the past   Past Medical History:  Past Medical History:  Diagnosis Date   Asthma    Cocaine abuse (HCC)    in past   Crohn disease (HCC) 2008   Depression    in past, not current as of 2019    ETOH abuse    history of alcohol use   GERD (gastroesophageal reflux disease)     Past Surgical History:  Procedure Laterality Date   BIOPSY  10/15/2017   Procedure: BIOPSY;  Surgeon: West Bali, MD;  Location: [facility];  Service: Endoscopy;;  terminal ileum colon   COLONOSCOPY WITH PROPOFOL N/A 10/15/2017   redundant colon, normal rectum, external and internal hemorrhoids, quiescent crohn's. Pathology: inactive chronicn nonspecific ileitis   ESOPHAGOGASTRODUODENOSCOPY (EGD) WITH PROPOFOL N/A 10/15/2017   normal esophagus, small hiatal hernia, gastritis, duodenitis    FINGER AMPUTATION Right 2012   cut finger table saw and surgically amputated    Family Psychiatric History:  Grandfather (maternal) - Bipolar disorder   Patient reports that he is unsure of anyone else with psychiatric issues and states that his mother's side of the family has a history of mental illness.   Family history of suicide: Great uncle (maternal)   Patient denies a family history of homicide   Substance abuse, patient states that 90% of of his cousins either abused alcohol or drugs  Family History:  Family History  Problem Relation Age of Onset   Stomach cancer Maternal Grandfather    Colon cancer Neg Hx     Social History:  Social History   Socioeconomic History   Marital status: Single    Spouse name: Not on file   Number of children: Not on file   Years of education: Not on file   Highest education level: Not on file  Occupational History   Not on file  Tobacco Use   Smoking status: Every Day    Current packs/day: 0.50    Average packs/day: 0.5 packs/day for 15.0 years (7.5 ttl pk-yrs)    Types: Cigarettes   Smokeless tobacco: Never   Tobacco comments:    1/4 to 1/2 pack per day   Vaping Use   Vaping status: Never Used  Substance and Sexual Activity   Alcohol use: Not Currently    Comment: alcoholic- last use 11/10/20   Drug use: Not Currently    Types: Cocaine, Marijuana, Methamphetamines    Comment: last cocaine use 10/10/20.  last meth april 2022   Sexual activity: Not on file  Other Topics Concern   Not on file  Social History Narrative   Not on file   Social Determinants of Health   Financial Resource Strain: Not on file  Food Insecurity: Not on file  Transportation Needs: Not on file  Physical Activity: Not on file  Stress: Not on file  Social Connections: Not on file    Allergies: No Known Allergies  Metabolic Disorder Labs: Lab Results  Component Value Date   HGBA1C 5.8 (H) 05/03/2022   MPG 120 05/03/2022   MPG 105.41  12/01/2021   Lab Results  Component Value Date   PROLACTIN 4.0 05/03/2022   PROLACTIN 9.7 06/08/2021   Lab Results  Component Value Date   CHOL 245 (H) 05/03/2022   TRIG 331 (H) 05/03/2022   HDL 104 05/03/2022   CHOLHDL 2.4 05/03/2022   VLDL 66 (H) 05/03/2022   LDLCALC 75 05/03/2022   LDLCALC 65 12/01/2021   Lab Results  Component Value Date   TSH 0.940 05/03/2022   TSH 2.185 12/01/2021    Therapeutic Level Labs: No results found for: "LITHIUM" No results found for: "VALPROATE" No results found for: "CBMZ"  Current Medications: Current Outpatient Medications  Medication Sig Dispense Refill   albuterol (VENTOLIN HFA) 108 (90 Base) MCG/ACT inhaler Inhale 2 puffs into the lungs every 6 (six) hours as needed for wheezing or shortness of breath. 8 g 1   ARIPiprazole (ABILIFY) 5 MG tablet Take 1 tablet (5 mg total) by  mouth daily. 30 tablet 2   fluticasone (FLONASE) 50 MCG/ACT nasal spray Place 1 spray into both nostrils daily. 16 g 0   loratadine (CLARITIN) 10 MG tablet Take 1 tablet (10 mg total) by mouth daily. 30 tablet 1   Multiple Vitamin (MULTIVITAMIN WITH MINERALS) TABS tablet Take 1 tablet by mouth daily.     sertraline (ZOLOFT) 100 MG tablet Take 1 tablet (100 mg total) by mouth daily. 30 tablet 2   traZODone (DESYREL) 50 MG tablet Take 1 tablet (50 mg total) by mouth at bedtime as needed for sleep. 30 tablet 1   No current facility-administered medications for this visit.     Musculoskeletal: Strength & Muscle Tone: within normal limits Gait & Station: normal Patient leans: N/A  Psychiatric Specialty Exam: Review of Systems  Psychiatric/Behavioral:  Negative for decreased concentration, dysphoric mood, hallucinations, self-injury, sleep disturbance and suicidal ideas. The patient is nervous/anxious. The patient is not hyperactive.     There were no vitals taken for this visit.There is no height or weight on file to calculate BMI.  General Appearance: Well  Groomed  Eye Contact:  Good  Speech:  Clear and Coherent and Normal Rate  Volume:  Normal  Mood:  Euthymic  Affect:  Appropriate  Thought Process:  Coherent, Goal Directed, and Descriptions of Associations: Intact  Orientation:  Full (Time, Place, and Person)  Thought Content: WDL   Suicidal Thoughts:  No  Homicidal Thoughts:  No  Memory:  Immediate;   Good Recent;   Good Remote;   Fair  Judgement:  Good  Insight:  Good  Psychomotor Activity:  Normal  Concentration:  Concentration: Good and Attention Span: Good  Recall:  Good  Fund of Knowledge: Good  Language: Good  Akathisia:  No  Handed:  Right  AIMS (if indicated): not done  Assets:  Communication Skills Desire for Improvement Social Support Transportation  ADL's:  Intact  Cognition: WNL  Sleep:  Good   Screenings: GAD-7    Flowsheet Row Video Visit from 04/10/2023 in [facility] Video Visit from 02/06/2023 in [facility] Video Visit from 12/05/2022 in [facility] Clinical Support from 09/05/2022 in [facility] Clinical Support from 07/04/2022 in [facility]  Total GAD-7 Score 2 1 2  0 0      PHQ2-9    Flowsheet Row Video Visit from 04/10/2023 in [facility] Video Visit from 02/06/2023 in [facility] Video Visit from 12/05/2022 in [facility] Office Visit from 10/16/2022 in [facility] Clinical Support from 09/05/2022 in [facility]  PHQ-2 Total Score 0 0 2 0 0  PHQ-9 Total Score -- -- 3 3 --      Flowsheet Row Video Visit from 04/10/2023 in [facility] Video Visit from 02/06/2023 in [facility] Video Visit from 12/05/2022 in [facility]  C-SSRS RISK CATEGORY No Risk No Risk No Risk        Assessment and Plan:   Brendan Ochoa is a 47 year old, Caucasian male with a past psychiatric history significant for generalized anxiety disorder, major depressive disorder and sleep disturbances who presents to [facility] via virtual video visit for follow-up and medication management.  Patient presents today encounter stating  that his medications have been helpful in managing his symptoms.  Patient endorses occasional depressive symptoms but denies them being a huge impact on his life at this time.  Patient also endorses some anxiety but states that it has been manageable as of late.  Patient endorses stability on his current medication regimen and would like to continue taking them as prescribed.  Patient's medications to be e-prescribed to pharmacy of choice.  Collaboration of Care: Collaboration of Care: Medication Management AEB provider managing patient's psychiatric medications, Psychiatrist AEB patient being seen by a mental health provider at this facility, and Referral or follow-up with counselor/therapist AEB patient being seen by a therapist outside of this facility  Patient/Guardian was advised Release of Information must be obtained prior to any record release in order to collaborate their care with an outside provider. Patient/Guardian was advised if they have not already done so to contact the registration department to sign all necessary forms in order for [name] to release information regarding their care.   Consent: Patient/Guardian gives verbal consent for treatment and assignment of benefits for services provided during this visit. Patient/Guardian expressed understanding and agreed to proceed.   1. Moderate episode of recurrent major depressive disorder (HCC)  - ARIPiprazole (ABILIFY) 5 MG tablet; Take 1 tablet (5 mg total) by mouth daily.  Dispense: 30  tablet; Refill: 2 - sertraline (ZOLOFT) 100 MG tablet; Take 1 tablet (100 mg total) by mouth daily.  Dispense: 30 tablet; Refill: 2  2. Generalized anxiety disorder  - sertraline (ZOLOFT) 100 MG tablet; Take 1 tablet (100 mg total) by mouth daily.  Dispense: 30 tablet; Refill: 2  Patient to follow-up in 2 months Provider spent a total of 10 minutes with the patient/reviewing patient's chart  Meta Hatchet, PA 04/10/2023, 12:56 PM

## 2023-04-18 ENCOUNTER — Ambulatory Visit: Payer: Self-pay | Admitting: Internal Medicine

## 2023-06-12 ENCOUNTER — Ambulatory Visit: Payer: Self-pay | Admitting: Internal Medicine

## 2023-06-12 NOTE — Telephone Encounter (Signed)
Copied from CRM [id number]. Topic: Clinical - Pink Word Triage >> Jun 12, 2023  1:34 PM Mosetta Putt H wrote: Reason for Triage: sharp pain in chest and had the flu for about a week, sharp pain lungs as well  Chief Complaint: Sharp chest pain Symptoms: Sharp chest pain, SOB Frequency: 3 days Pertinent Negatives: Patient denies relief Disposition: [x] ED /[] Urgent Care (no appt availability in office) / [] Appointment(In office/virtual)/ []  [facility]/ [] Home Care/ [] Refused Recommended Disposition /[] [facility]/ []  Follow-up with PCP Additional Notes: Patient called in to report sharp pain in his chest. Patient stated that he has been sick with the flu for the past week, but he started having sharp pain in his chest within the past 3 days. Patient stated that the pain is located on the sides of his chest, under his arm pits. Patient stated that it really hurts to take a deep breath and stated that he experiences SOB with minimal exertion. Patient's breathing sounded labored while on the phone with this RN. Patient also reported that he experiences pain when coughing. Patient stated that the pain is present at rest and has worsened over the past 3 days. Patient stated that chills, aches, and nausea from the flu have mostly resided. Advised ED. Patient complied.    Reason for Disposition  Taking a deep breath makes pain worse  Answer Assessment - Initial Assessment Questions 1. LOCATION: "Where does it hurt?"       States pain is located on sides of chest under armpits 2. RADIATION: "Does the pain go anywhere else?" (e.g., into neck, jaw, arms, back)     Denies radiation to arms and necks 3. ONSET: "When did the chest pain begin?" (Minutes, hours or days)      States he has been sick for a week, but chest pain started a couple of days ago  4. PATTERN: "Does the pain come and go, or has it been constant since it started?"  "Does it get worse with exertion?"      States pain has  been pretty constant for the past few days, denies relief, states pain has gotten worse today 5. DURATION: "How long does it last" (e.g., seconds, minutes, hours)     States pain has been pretty constant for the past few days 6. SEVERITY: "How bad is the pain?"  (e.g., Scale 1-10; mild, moderate, or severe)    - MILD (1-3): doesn't interfere with normal activities     - MODERATE (4-7): interferes with normal activities or awakens from sleep    - SEVERE (8-10): excruciating pain, unable to do any normal activities       States pain is about a 6-7, states breathing out does not hurt, states taking a deep breath really hurts 7. CARDIAC RISK FACTORS: "Do you have any history of heart problems or risk factors for heart disease?" (e.g., angina, prior heart attack; diabetes, high blood pressure, high cholesterol, smoker, or strong family history of heart disease)     Denies 9. CAUSE: "What do you think is causing the chest pain?"     States he has had the flu for a week  10. OTHER SYMPTOMS: "Do you have any other symptoms?" (e.g., dizziness, nausea, vomiting, sweating, fever, difficulty breathing, cough)       Chills, muscle aches, nausea, states the chest pain is causing difficulty breathing, states it really hurts when he coughs, states he is having to take "smaller breaths", states he gets winded with minimal  exertion  Protocols used: Chest Pain-A-AH

## 2023-06-13 ENCOUNTER — Other Ambulatory Visit: Payer: Self-pay

## 2023-06-13 ENCOUNTER — Telehealth (INDEPENDENT_AMBULATORY_CARE_PROVIDER_SITE_OTHER): Payer: No Payment, Other | Admitting: Physician Assistant

## 2023-06-13 DIAGNOSIS — Z79899 Other long term (current) drug therapy: Secondary | ICD-10-CM

## 2023-06-13 DIAGNOSIS — F411 Generalized anxiety disorder: Secondary | ICD-10-CM

## 2023-06-13 DIAGNOSIS — F331 Major depressive disorder, recurrent, moderate: Secondary | ICD-10-CM | POA: Diagnosis not present

## 2023-06-16 ENCOUNTER — Encounter (HOSPITAL_COMMUNITY): Payer: Self-pay | Admitting: Physician Assistant

## 2023-06-16 MED ORDER — SERTRALINE HCL 50 MG PO TABS
150.0000 mg | ORAL_TABLET | Freq: Every day | ORAL | 1 refills | Status: DC
  Filled 2023-06-16: qty 90, 30d supply, fill #0

## 2023-06-16 MED ORDER — ARIPIPRAZOLE 5 MG PO TABS
5.0000 mg | ORAL_TABLET | Freq: Every day | ORAL | 1 refills | Status: DC
  Filled 2023-06-16: qty 30, 30d supply, fill #0

## 2023-06-16 NOTE — Progress Notes (Signed)
BH MD/PA/NP OP Progress Note  Virtual Visit via Video Note  I connected with Brendan Ochoa on 06/13/23 at 11:00 AM EST by a video enabled telemedicine application and verified that I am speaking with the correct person using two identifiers.  Location: Patient: Home Provider: Clinic   I discussed the limitations of evaluation and management by telemedicine and the availability of in person appointments. The patient expressed understanding and agreed to proceed.  Follow Up Instructions:  I discussed the assessment and treatment plan with the patient. The patient was provided an opportunity to ask questions and all were answered. The patient agreed with the plan and demonstrated an understanding of the instructions.   The patient was advised to call back or seek an in-person evaluation if the symptoms worsen or if the condition fails to improve as anticipated.  I provided 17 minutes of non-face-to-face time during this encounter.  Meta Hatchet, PA    06/13/2023 6:58 PM Brendan Ochoa  MRN:  [id number]  Chief Complaint:  Chief Complaint  Patient presents with   Follow-up   Medication Management   HPI:   Brendan Ochoa is a 48 year old, Caucasian male with a past psychiatric history significant for generalized anxiety disorder, major depressive disorder and sleep disturbances who presents to [facility] via virtual video visit for follow-up and medication management.  Patient is currently being managed on the following psychiatric medications:  Abilify 5 mg daily Sertraline 100 mg daily  Patient presents to the encounter stating that he failed his sobriety.  He reports that when his dog passed, he went on an alcohol binge.  During his alcohol binge, patient states that he did not take his medications regularly.  He reports that he has been able to maintain his sobriety for 2 weeks now.  Patient reports that he started taking  his medications for roughly 2 weeks after detoxing from his alcohol binge.  Since starting his medications, patient is unsure if his medications have been effective.  He reports that he is still in a funk after losing his dog.  Patient endorses depression and rates his depression as 5 out of 10 with 10 being most severe.  Patient endorses depressive episodes every day.  Patient endorses the following depressive symptoms: feelings of sadness, lack of motivation, decreased energy, feelings of guilt/worthlessness, and hopelessness.  In addition to depression, patient endorses anxiety and rates his anxiety as 5 out of 10.  He reports that his anxiety comes in waves and states that his main stressor revolves around missing his dog.  A PHQ-9 screen was performed with the patient scoring a 21.  A GAD-7 screen was also performed with the patient scoring at 13.  Patient is alert and oriented x 4, calm, cooperative, and fully engaged in conversation during the encounter.  Patient describes his mood as "blah."  Patient reports that he feels like he is in a daze.  Patient denies suicidal or homicidal ideations.  He further denies auditory or visual hallucinations and does not appear to be responding to internal/external stimuli.  Patient endorses hypersomnia and sleeps from 10 to 12 hours/day.  Patient endorses fair appetite and eats on average 1-2 meals per day.  Patient denies alcohol use at this time and states that he has been sober for 2 weeks.  Patient endorses tobacco use and smokes on average 1/2 pack/day.  Patient denies current use of illicit drugs but states that he did recently smoked marijuana.  Visit Diagnosis:    ICD-10-CM   1. Moderate episode of recurrent major depressive disorder (HCC)  F33.1 ARIPiprazole (ABILIFY) 5 MG tablet    sertraline (ZOLOFT) 50 MG tablet    2. Generalized anxiety disorder  F41.1 sertraline (ZOLOFT) 50 MG tablet       Past Psychiatric History:  Patient endorses a past  history of bipolar disorder. Patient has also been diagnosed with major depressive disorder. Patient struggles with alcohol abuse and has also struggled with drug abuse in the past   Past Medical History:  Past Medical History:  Diagnosis Date   Asthma    Cocaine abuse (HCC)    in past   Crohn disease (HCC) 2008   Depression    in past, not current as of 2019    ETOH abuse    history of alcohol use   GERD (gastroesophageal reflux disease)     Past Surgical History:  Procedure Laterality Date   BIOPSY  10/15/2017   Procedure: BIOPSY;  Surgeon: West Bali, MD;  Location: [facility];  Service: Endoscopy;;  terminal ileum colon   COLONOSCOPY WITH PROPOFOL N/A 10/15/2017   redundant colon, normal rectum, external and internal hemorrhoids, quiescent crohn's. Pathology: inactive chronicn nonspecific ileitis   ESOPHAGOGASTRODUODENOSCOPY (EGD) WITH PROPOFOL N/A 10/15/2017   normal esophagus, small hiatal hernia, gastritis, duodenitis   FINGER AMPUTATION Right 2012   cut finger table saw and surgically amputated    Family Psychiatric History:  Grandfather (maternal) - Bipolar disorder   Patient reports that he is unsure of anyone else with psychiatric issues and states that his mother's side of the family has a history of mental illness.   Family history of suicide: Great uncle (maternal)   Patient denies a family history of homicide   Substance abuse, patient states that 90% of of his cousins either abused alcohol or drugs  Family History:  Family History  Problem Relation Age of Onset   Stomach cancer Maternal Grandfather    Colon cancer Neg Hx     Social History:  Social History   Socioeconomic History   Marital status: Single    Spouse name: Not on file   Number of children: Not on file   Years of education: Not on file   Highest education level: Not on file  Occupational History   Not on file  Tobacco Use   Smoking status: Every Day    Current packs/day: 0.50     Average packs/day: 0.5 packs/day for 15.0 years (7.5 ttl pk-yrs)    Types: Cigarettes   Smokeless tobacco: Never   Tobacco comments:    1/4 to 1/2 pack per day   Vaping Use   Vaping status: Never Used  Substance and Sexual Activity   Alcohol use: Not Currently    Comment: alcoholic- last use 11/10/20   Drug use: Not Currently    Types: Cocaine, Marijuana, Methamphetamines    Comment: last cocaine use 10/10/20.  last meth april 2022   Sexual activity: Not on file  Other Topics Concern   Not on file  Social History Narrative   Not on file   Social Drivers of Health   Financial Resource Strain: Not on file  Food Insecurity: Not on file  Transportation Needs: Not on file  Physical Activity: Not on file  Stress: Not on file  Social Connections: Not on file    Allergies: No Known Allergies  Metabolic Disorder Labs: Lab Results  Component Value Date   HGBA1C  5.8 (H) 05/03/2022   MPG 120 05/03/2022   MPG 105.41 12/01/2021   Lab Results  Component Value Date   PROLACTIN 4.0 05/03/2022   PROLACTIN 9.7 06/08/2021   Lab Results  Component Value Date   CHOL 245 (H) 05/03/2022   TRIG 331 (H) 05/03/2022   HDL 104 05/03/2022   CHOLHDL 2.4 05/03/2022   VLDL 66 (H) 05/03/2022   LDLCALC 75 05/03/2022   LDLCALC 65 12/01/2021   Lab Results  Component Value Date   TSH 0.940 05/03/2022   TSH 2.185 12/01/2021    Therapeutic Level Labs: No results found for: "LITHIUM" No results found for: "VALPROATE" No results found for: "CBMZ"  Current Medications: Current Outpatient Medications  Medication Sig Dispense Refill   albuterol (VENTOLIN HFA) 108 (90 Base) MCG/ACT inhaler Inhale 2 puffs into the lungs every 6 (six) hours as needed for wheezing or shortness of breath. 8 g 1   ARIPiprazole (ABILIFY) 5 MG tablet Take 1 tablet (5 mg total) by mouth daily. 30 tablet 1   fluticasone (FLONASE) 50 MCG/ACT nasal spray Place 1 spray into both nostrils daily. 16 g 0   loratadine  (CLARITIN) 10 MG tablet Take 1 tablet (10 mg total) by mouth daily. 30 tablet 1   Multiple Vitamin (MULTIVITAMIN WITH MINERALS) TABS tablet Take 1 tablet by mouth daily.     sertraline (ZOLOFT) 50 MG tablet Take 3 tablets (150 mg total) by mouth daily. 90 tablet 1   No current facility-administered medications for this visit.     Musculoskeletal: Strength & Muscle Tone: within normal limits Gait & Station: normal Patient leans: N/A  Psychiatric Specialty Exam: Review of Systems  Psychiatric/Behavioral:  Negative for decreased concentration, dysphoric mood, hallucinations, self-injury, sleep disturbance and suicidal ideas. The patient is nervous/anxious. The patient is not hyperactive.     There were no vitals taken for this visit.There is no height or weight on file to calculate BMI.  General Appearance: Well Groomed  Eye Contact:  Good  Speech:  Clear and Coherent and Normal Rate  Volume:  Normal  Mood:  Euthymic  Affect:  Appropriate  Thought Process:  Coherent, Goal Directed, and Descriptions of Associations: Intact  Orientation:  Full (Time, Place, and Person)  Thought Content: WDL   Suicidal Thoughts:  No  Homicidal Thoughts:  No  Memory:  Immediate;   Good Recent;   Good Remote;   Fair  Judgement:  Good  Insight:  Good  Psychomotor Activity:  Normal  Concentration:  Concentration: Good and Attention Span: Good  Recall:  Good  Fund of Knowledge: Good  Language: Good  Akathisia:  No  Handed:  Right  AIMS (if indicated): not done  Assets:  Communication Skills Desire for Improvement Social Support Transportation  ADL's:  Intact  Cognition: WNL  Sleep:  Good   Screenings: GAD-7    Flowsheet Row Video Visit from 06/13/2023 in [facility] Video Visit from 04/10/2023 in [facility] Video Visit from 02/06/2023 in [facility] Video Visit from 12/05/2022 in [facility] Clinical Support from 09/05/2022 in [facility]  Total GAD-7 Score 13 2 1 2  0      PHQ2-9    Flowsheet Row Video Visit from 06/13/2023 in [facility] Video Visit from 04/10/2023 in [facility] Video Visit from 02/06/2023 in [facility] Video Visit from 12/05/2022 in [facility] Office Visit from 10/16/2022 in [facility]  PHQ-2 Total Score 6 0 0 2 0  PHQ-9 Total Score 21 -- -- 3 3      Flowsheet Row Video Visit from 06/13/2023 in [facility] Video Visit from 04/10/2023 in [facility] Video Visit from 02/06/2023 in [facility]  C-SSRS RISK CATEGORY Low Risk No Risk No Risk        Assessment and Plan:   Brendan Ochoa is a 48 year old, Caucasian male with a past psychiatric history significant for generalized anxiety disorder, major depressive disorder and sleep disturbances who presents to [facility] via virtual video visit for follow-up and medication management.  Patient states that he recently ended his alcohol sobriety and went on an alcohol binge shortly after losing his dog.  During his alcohol binge, patient states that he was not taking his medications regularly.  Patient reports that he has been sober from alcohol for roughly 2 weeks and states that he has also restarted his medications.  Since restarting his medications, patient reports that he is unsure if his medications are as effective as they were before.  Patient continues to endorse depression and anxiety.  Provider recommended increasing patient's sertraline from 100 mg to 150 mg daily for the management of his depression and anxiety.  Patient was agreeable to recommendation.   Patient's medications to be prescribed to pharmacy of choice.  Due to his use of Abilify, provider informed patient that the following labs need to be performed: Hemoglobin A1c, complete metabolic panel, lipid profile, and complete blood count with differential.  Patient vocalized understanding.  Collaboration of Care: Collaboration of Care: Medication Management AEB provider managing patient's psychiatric medications, Psychiatrist AEB patient being seen by a mental health provider at this facility, and Referral or follow-up with counselor/therapist AEB patient being seen by a therapist outside of this facility  Patient/Guardian was advised Release of Information must be obtained prior to any record release in order to collaborate their care with an outside provider. Patient/Guardian was advised if they have not already done so to contact the registration department to sign all necessary forms in order for [name] to release information regarding their care.   Consent: Patient/Guardian gives verbal consent for treatment and assignment of benefits for services provided during this visit. Patient/Guardian expressed understanding and agreed to proceed.   1. Moderate episode of recurrent major depressive disorder (HCC)  - ARIPiprazole (ABILIFY) 5 MG tablet; Take 1 tablet (5 mg total) by mouth daily.  Dispense: 30 tablet; Refill: 1 - sertraline (ZOLOFT) 50 MG tablet; Take 3 tablets (150 mg total) by mouth daily.  Dispense: 90 tablet; Refill: 1  2. Generalized anxiety disorder  - sertraline (ZOLOFT) 50 MG tablet; Take 3 tablets (150 mg total) by mouth daily.  Dispense: 90 tablet; Refill: 1  Patient to follow-up in 6 weeks Provider spent a total of 17 minutes with the patient/reviewing patient's chart  Meta Hatchet, PA 06/13/2023, 6:58 PM

## 2023-06-17 ENCOUNTER — Other Ambulatory Visit: Payer: Self-pay

## 2023-06-27 ENCOUNTER — Other Ambulatory Visit: Payer: Self-pay

## 2023-07-12 ENCOUNTER — Other Ambulatory Visit (HOSPITAL_COMMUNITY): Payer: Self-pay

## 2023-07-12 DIAGNOSIS — Z79899 Other long term (current) drug therapy: Secondary | ICD-10-CM

## 2023-07-22 ENCOUNTER — Ambulatory Visit: Payer: Self-pay | Admitting: Internal Medicine

## 2023-07-22 NOTE — Telephone Encounter (Signed)
 Noted. Patient has appointment schedule 07/25/2023 to address concerns.

## 2023-07-22 NOTE — Telephone Encounter (Signed)
  Chief Complaint: Sinus Congestion Symptoms: Yellow-green nasal discharge Frequency: patient states this happens every month Pertinent Negatives: Patient denies fever, CP, SOB Disposition: [] ED /[] Urgent Care (no appt availability in office) / [x] Appointment(In office/virtual)/ []  [facility]/ [] Home Care/ [] Refused Recommended Disposition /[] [facility]/ []  Follow-up with PCP Additional Notes: patient called with concerns for sinus congestion that involved yellow-green discharge. Patient states symptoms happen every month. Reports he wants to be seen again for this. Per protocol, patient is scheduled for an appointment on March 13th at PCP office. Patient verbalizes understanding of plan and all questions answered.     Copied from CRM [id number]. Topic: Clinical - Red Word Triage >> Jul 22, 2023 11:22 AM Marlow Baars wrote: Red Word that prompted transfer to Nurse Triage: The patient called in complaining of bad congestion and a sore throat in the mornings. He says he wakes up choked up and has a lot of yellow-green discharge. With the symptoms getting worse and with getting choked up in the morning I will transfer the patient to E2C2 NT Reason for Disposition  [1] Sinus congestion (pressure, fullness) AND [2] present > 10 days  Answer Assessment - Initial Assessment Questions 1. LOCATION: "Where does it hurt?"      Under eyes-pain isn't bad 2. ONSET: "When did the sinus pain start?"  (e.g., hours, days)      A month ago 3. SEVERITY: "How bad is the pain?"   (Scale 1-10; mild, moderate or severe)   - MILD (1-3): doesn't interfere with normal activities    - MODERATE (4-7): interferes with normal activities (e.g., work or school) or awakens from sleep   - SEVERE (8-10): excruciating pain and patient unable to do any normal activities        3-4 4. RECURRENT SYMPTOM: "Have you ever had sinus problems before?" If Yes, ask: "When was the last time?" and "What happened that  time?"      Every month 5. NASAL CONGESTION: "Is the nose blocked?" If Yes, ask: "Can you open it or must you breathe through your mouth?"     Blocked in the morning 6. NASAL DISCHARGE: "Do you have discharge from your nose?" If so ask, "What color?"     Yellow-green discharge 7. FEVER: "Do you have a fever?" If Yes, ask: "What is it, how was it measured, and when did it start?"      No 8. OTHER SYMPTOMS: "Do you have any other symptoms?" (e.g., sore throat, cough, earache, difficulty breathing)     No  Protocols used: Sinus Pain or Congestion-A-AH

## 2023-07-25 ENCOUNTER — Encounter: Payer: Self-pay | Admitting: Physician Assistant

## 2023-07-25 ENCOUNTER — Ambulatory Visit: Payer: Self-pay | Attending: Physician Assistant | Admitting: Physician Assistant

## 2023-07-25 ENCOUNTER — Other Ambulatory Visit: Payer: Self-pay

## 2023-07-25 VITALS — BP 119/70 | HR 106 | Ht 71.0 in | Wt 167.4 lb

## 2023-07-25 DIAGNOSIS — J0100 Acute maxillary sinusitis, unspecified: Secondary | ICD-10-CM

## 2023-07-25 DIAGNOSIS — F17210 Nicotine dependence, cigarettes, uncomplicated: Secondary | ICD-10-CM

## 2023-07-25 DIAGNOSIS — F1010 Alcohol abuse, uncomplicated: Secondary | ICD-10-CM

## 2023-07-25 DIAGNOSIS — R0981 Nasal congestion: Secondary | ICD-10-CM

## 2023-07-25 DIAGNOSIS — F17200 Nicotine dependence, unspecified, uncomplicated: Secondary | ICD-10-CM

## 2023-07-25 DIAGNOSIS — R739 Hyperglycemia, unspecified: Secondary | ICD-10-CM

## 2023-07-25 DIAGNOSIS — Z5900 Homelessness unspecified: Secondary | ICD-10-CM

## 2023-07-25 LAB — POCT GLYCOSYLATED HEMOGLOBIN (HGB A1C): Hemoglobin A1C: 5.6 % (ref 4.0–5.6)

## 2023-07-25 LAB — GLUCOSE, POCT (MANUAL RESULT ENTRY): POC Glucose: 144 mg/dL — AB (ref 70–99)

## 2023-07-25 MED ORDER — PREDNISONE 10 MG PO TABS
ORAL_TABLET | ORAL | 0 refills | Status: DC
  Filled 2023-07-25: qty 21, 6d supply, fill #0

## 2023-07-25 MED ORDER — AMOXICILLIN 500 MG PO CAPS
1000.0000 mg | ORAL_CAPSULE | Freq: Two times a day (BID) | ORAL | 0 refills | Status: DC
  Filled 2023-07-25: qty 40, 10d supply, fill #0

## 2023-07-25 MED ORDER — FLUTICASONE PROPIONATE 50 MCG/ACT NA SUSP
1.0000 | Freq: Every day | NASAL | 2 refills | Status: DC
  Filled 2023-07-25: qty 16, 30d supply, fill #0

## 2023-07-25 NOTE — Progress Notes (Signed)
 Patient ID: Brendan Ochoa, male   DOB: 1976-03-28, 48 y.o.   MRN: [id number]   Brendan Ochoa, is a 48 y.o. male  NUU:[id number]  HKV:[id number]  DOB - Oct 22, 1975  Chief Complaint  Patient presents with   Sinusitis       Subjective:   Brendan Ochoa is a 48 y.o. male here today for sinus congestion that has been going on over a month.  Thick and purulent discharge when he blows his nose.  Currently homeless.  Very forthcoming and immediately shared with me that he is struggling with alcoholism.  He has lost the ability to work.  He is homeless and living in his tent with his dog.  His family would support him getting help but not living the way he is.  His brother and sister are "high functioning" alcoholics and are able to keep employment currently.   He has been to AA and it works as long as he goes and is active, but then he starts feeling better and gets away from people in recovery and always returns to alcohol.  He has been to treatment before.    No problems updated.  ALLERGIES: No Known Allergies  PAST MEDICAL HISTORY: Past Medical History:  Diagnosis Date   Asthma    Cocaine abuse (HCC)    in past   Crohn disease (HCC) 2008   Depression    in past, not current as of 2019    ETOH abuse    history of alcohol use   GERD (gastroesophageal reflux disease)     MEDICATIONS AT HOME: Prior to Admission medications   Medication Sig Start Date End Date Taking? Authorizing Provider  amoxicillin (AMOXIL) 500 MG capsule Take 2 capsules (1,000 mg total) by mouth 2 (two) times daily for 10 days. 07/25/23 08/04/23 Yes Brendan Ochoa, Marzella Schlein, PA-C  predniSONE (DELTASONE) 10 MG tablet Take 6 tablets (60 mg total) by mouth daily for 1 day, THEN 5 tablets (50 mg total) daily for 1 day, THEN 4 tablets (40 mg total) daily for 1 day, THEN 3 tablets (30 mg total) daily for 1 day, THEN 2 tablets (20 mg total) daily for 1 day, THEN 1 tablet (10 mg total) daily for 1 day. Take with food. 07/25/23  07/31/23 Yes Mareesa Gathright, Marzella Schlein, PA-C  albuterol (VENTOLIN HFA) 108 (90 Base) MCG/ACT inhaler Inhale 2 puffs into the lungs every 6 (six) hours as needed for wheezing or shortness of breath. Patient not taking: Reported on 07/25/2023 07/28/21   Estella Husk, MD  ARIPiprazole (ABILIFY) 5 MG tablet Take 1 tablet (5 mg total) by mouth daily. Patient not taking: Reported on 07/25/2023 06/16/23   Karel Jarvis E, PA  fluticasone (FLONASE) 50 MCG/ACT nasal spray Place 1 spray into both nostrils daily. 07/25/23   Brendan Simmonds, PA-C  loratadine (CLARITIN) 10 MG tablet Take 1 tablet (10 mg total) by mouth daily. Patient not taking: Reported on 07/25/2023 02/12/23   Marcine Matar, MD  Multiple Vitamin (MULTIVITAMIN WITH MINERALS) TABS tablet Take 1 tablet by mouth daily. Patient not taking: Reported on 07/25/2023    [provider]  sertraline (ZOLOFT) 50 MG tablet Take 3 tablets (150 mg total) by mouth daily. Patient not taking: Reported on 07/25/2023 06/16/23   Karel Jarvis E, PA    ROS:  Neg cardiac Neg GI Neg GU Neg MS Neg psych Neg neuro  Objective:   Vitals:   07/25/23 1508  BP: 119/70  Pulse: (!) 106  SpO2:  95%  Weight: 167 lb 6.4 oz (75.9 kg)  Height: 5\' 11"  (1.803 m)   Exam General appearance : Awake, alert, not in any distress. Speech Clear. Not toxic looking HEENT: Atraumatic and Normocephalic, maxillary area is swollen B and TTP.  TM B congested but no erythema Neck: Supple, no JVD. No cervical lymphadenopathy.  Chest: Good air entry bilaterally. No rales/rhonchi.  There is some mild and scattered wheezing CVS: S1 S2 regular, no murmurs.  Extremities: B/L Lower Ext shows no edema, both legs are warm to touch Neurology: Awake alert, and oriented X 3, CN II-XII intact, Non focal Skin: No Rash  Data Review Lab Results  Component Value Date   HGBA1C 5.6 07/25/2023   HGBA1C 5.8 (H) 05/03/2022   HGBA1C 5.3 12/01/2021    Assessment & Plan   1. Subacute  maxillary sinusitis (Primary) - amoxicillin (AMOXIL) 500 MG capsule; Take 2 capsules (1,000 mg total) by mouth 2 (two) times daily for 10 days.  Dispense: 40 capsule; Refill: 0 - fluticasone (FLONASE) 50 MCG/ACT nasal spray; Place 1 spray into both nostrils daily.  Dispense: 16 g; Refill: 2 - Glucose (CBG) - predniSONE (DELTASONE) 10 MG tablet; Take 6 tablets (60 mg total) by mouth daily for 1 day, THEN 5 tablets (50 mg total) daily for 1 day, THEN 4 tablets (40 mg total) daily for 1 day, THEN 3 tablets (30 mg total) daily for 1 day, THEN 2 tablets (20 mg total) daily for 1 day, THEN 1 tablet (10 mg total) daily for 1 day. Take with food.  Dispense: 21 tablet; Refill: 0  2. Sinus congestion - fluticasone (FLONASE) 50 MCG/ACT nasal spray; Place 1 spray into both nostrils daily.  Dispense: 16 g; Refill: 2 - Glucose (CBG) - predniSONE (DELTASONE) 10 MG tablet; Take 6 tablets (60 mg total) by mouth daily for 1 day, THEN 5 tablets (50 mg total) daily for 1 day, THEN 4 tablets (40 mg total) daily for 1 day, THEN 3 tablets (30 mg total) daily for 1 day, THEN 2 tablets (20 mg total) daily for 1 day, THEN 1 tablet (10 mg total) daily for 1 day. Take with food.  Dispense: 21 tablet; Refill: 0  3. Hyperglycemia A1C normal today - HgB A1c  4. Alcohol abuse [place].org is the website for narcotics anonymous [employer].cy (website) or 978-084-6848 is the information for alcoholics anonymous Both are free and immediately available for help with alcohol and drug use Encouraged [facility] or [facility] for medical detox and possible treatment recommendations. I have counseled the patient at length about substance abuse and addiction.  12 step meetings/recovery recommended.  Local 12 step meeting lists were given and attendance was encouraged.  Patient expresses understanding.    5. Tobacco dependence Smoking and dangers of nicotine have been discussed at length. Long term health consequences of smoking  reviewed in detail.  Methods for helping with cessation have been reviewed.  Patient expresses understanding.  6.  Homelessness-SDOH referral for resources    Return in about 6 weeks (around 09/05/2023).  The patient was given clear instructions to go to ER or return to medical center if symptoms don't improve, worsen or new problems develop. The patient verbalized understanding. The patient was told to call to get lab results if they haven't heard anything in the next week.      Georgian Co, PA-C [facility] [place], [place] [phone]   07/25/2023, 4:10 PM

## 2023-07-25 NOTE — Patient Instructions (Addendum)
 [place].org is the website for narcotics anonymous  [employer] (website) or [phone] is the information for alcoholics anonymous  Both are free and immediately available for help with alcohol and drug use   Please call either Michael at [phone] or Phil at [phone].  Tell them you met me today.  Either would be willing to help you get to detox or treatment.  [facility] is located across the street from [facility].

## 2023-07-26 ENCOUNTER — Encounter (HOSPITAL_COMMUNITY): Payer: Self-pay | Admitting: Physician Assistant

## 2023-07-26 ENCOUNTER — Telehealth (HOSPITAL_COMMUNITY): Payer: No Payment, Other | Admitting: Physician Assistant

## 2023-07-26 ENCOUNTER — Other Ambulatory Visit: Payer: Self-pay

## 2023-07-26 ENCOUNTER — Telehealth: Payer: Self-pay | Admitting: *Deleted

## 2023-07-26 DIAGNOSIS — F331 Major depressive disorder, recurrent, moderate: Secondary | ICD-10-CM

## 2023-07-26 DIAGNOSIS — F411 Generalized anxiety disorder: Secondary | ICD-10-CM

## 2023-07-26 DIAGNOSIS — F1020 Alcohol dependence, uncomplicated: Secondary | ICD-10-CM

## 2023-07-26 NOTE — Progress Notes (Signed)
 BH MD/PA/NP OP Progress Note  Virtual Visit via Video Note  I connected with Brendan Ochoa on 07/26/23 at 11:00 AM EDT by a video enabled telemedicine application and verified that I am speaking with the correct person using two identifiers.  Location: Patient: Home Provider: Clinic   I discussed the limitations of evaluation and management by telemedicine and the availability of in person appointments. The patient expressed understanding and agreed to proceed.  Follow Up Instructions:  I discussed the assessment and treatment plan with the patient. The patient was provided an opportunity to ask questions and all were answered. The patient agreed with the plan and demonstrated an understanding of the instructions.   The patient was advised to call back or seek an in-person evaluation if the symptoms worsen or if the condition fails to improve as anticipated.  I provided 23 minutes of non-face-to-face time during this encounter.  Meta Hatchet, PA    07/26/2023 1:53 PM Brendan Ochoa  MRN:  [id number]  Chief Complaint:  Chief Complaint  Patient presents with   Follow-up   HPI:   Brendan Ochoa is a 48 year old, Caucasian male with a past psychiatric history significant for generalized anxiety disorder, major depressive disorder and sleep disturbances who presents to [facility] via virtual video visit for follow-up and medication management.  Patient is currently being managed on the following psychiatric medications:  Abilify 5 mg daily Sertraline 150 mg daily  Patient reports that he has been drinking more due to his stressors.  Patient reports that he may need detox.  Since drinking, patient reports that he has not been taking his medications regularly.  He also informed provider that he has been unable to get his labs run.  Patient reports that his drinking has progressed to drinking 10 beers a day.  Patient reports  that he experiences the following withdrawal symptoms when not drinking: headaches, nausea, tremors, and anxiety.  Patient states that he started drinking and after having little hope for himself.  He endorses depression stating that he has been keeping himself #3 alcohol consumption.  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  Patient also endorses anxiety and states that he is able to mask his anxiety when drinking.  A PHQ-9 screen was performed with the patient scoring a 23.  A GAD-7 screen was also performed with the patient scoring a 16.  Patient is alert and oriented x 4, calm, cooperative, and fully engaged in conversation during the encounter.  Patient describes his mood as not good.  Patient exhibits depressed mood with congruent affect.  Patient denies suicidal or homicidal ideations.  He further denies auditory or visual hallucinations and does not appear to be responding to internal/external stimuli.  Patient endorses poor sleep and is only able to sleep a couple of hours at a time.  Patient endorses poor appetite.  Patient endorses excessive alcohol consumption and drinks roughly 10-12 beers per day.  Patient endorses tobacco use.  Patient denies illicit drug use.  Visit Diagnosis:    ICD-10-CM   1. Alcohol use disorder, severe, dependence (HCC)  F10.20     2. Moderate episode of recurrent major depressive disorder (HCC)  F33.1     3. Generalized anxiety disorder  F41.1       Past Psychiatric History:  Patient endorses a past history of bipolar disorder. Patient has also been diagnosed with major depressive disorder. Patient struggles  with alcohol abuse and has also struggled with drug abuse in the past   Past Medical History:  Past Medical History:  Diagnosis Date   Asthma    Cocaine abuse (HCC)    in past   Crohn disease (HCC) 2008   Depression    in past, not current as of 2019    ETOH abuse    history of alcohol use   GERD  (gastroesophageal reflux disease)     Past Surgical History:  Procedure Laterality Date   BIOPSY  10/15/2017   Procedure: BIOPSY;  Surgeon: West Bali, MD;  Location: [facility];  Service: Endoscopy;;  terminal ileum colon   COLONOSCOPY WITH PROPOFOL N/A 10/15/2017   redundant colon, normal rectum, external and internal hemorrhoids, quiescent crohn's. Pathology: inactive chronicn nonspecific ileitis   ESOPHAGOGASTRODUODENOSCOPY (EGD) WITH PROPOFOL N/A 10/15/2017   normal esophagus, small hiatal hernia, gastritis, duodenitis   FINGER AMPUTATION Right 2012   cut finger table saw and surgically amputated    Family Psychiatric History:  Grandfather (maternal) - Bipolar disorder   Patient reports that he is unsure of anyone else with psychiatric issues and states that his mother's side of the family has a history of mental illness.   Family history of suicide: Great uncle (maternal)   Patient denies a family history of homicide   Substance abuse, patient states that 90% of of his cousins either abused alcohol or drugs  Family History:  Family History  Problem Relation Age of Onset   Stomach cancer Maternal Grandfather    Colon cancer Neg Hx     Social History:  Social History   Socioeconomic History   Marital status: Single    Spouse name: Not on file   Number of children: Not on file   Years of education: Not on file   Highest education level: Not on file  Occupational History   Not on file  Tobacco Use   Smoking status: Every Day    Current packs/day: 0.50    Average packs/day: 0.5 packs/day for 15.0 years (7.5 ttl pk-yrs)    Types: Cigarettes   Smokeless tobacco: Never   Tobacco comments:    1/4 to 1/2 pack per day   Vaping Use   Vaping status: Never Used  Substance and Sexual Activity   Alcohol use: Not Currently    Comment: alcoholic- last use 11/10/20   Drug use: Not Currently    Types: Cocaine, Marijuana, Methamphetamines    Comment: last cocaine use  10/10/20.  last meth april 2022   Sexual activity: Not on file  Other Topics Concern   Not on file  Social History Narrative   Not on file   Social Drivers of Health   Financial Resource Strain: Not on file  Food Insecurity: Not on file  Transportation Needs: Not on file  Physical Activity: Not on file  Stress: Not on file  Social Connections: Not on file    Allergies: No Known Allergies  Metabolic Disorder Labs: Lab Results  Component Value Date   HGBA1C 5.6 07/25/2023   MPG 120 05/03/2022   MPG 105.41 12/01/2021   Lab Results  Component Value Date   PROLACTIN 4.0 05/03/2022   PROLACTIN 9.7 06/08/2021   Lab Results  Component Value Date   CHOL 245 (H) 05/03/2022   TRIG 331 (H) 05/03/2022   HDL 104 05/03/2022   CHOLHDL 2.4 05/03/2022   VLDL 66 (H) 05/03/2022   LDLCALC 75 05/03/2022   LDLCALC 65  12/01/2021   Lab Results  Component Value Date   TSH 0.940 05/03/2022   TSH 2.185 12/01/2021    Therapeutic Level Labs: No results found for: "LITHIUM" No results found for: "VALPROATE" No results found for: "CBMZ"  Current Medications: Current Outpatient Medications  Medication Sig Dispense Refill   albuterol (VENTOLIN HFA) 108 (90 Base) MCG/ACT inhaler Inhale 2 puffs into the lungs every 6 (six) hours as needed for wheezing or shortness of breath. (Patient not taking: Reported on 07/25/2023) 8 g 1   amoxicillin (AMOXIL) 500 MG capsule Take 2 capsules (1,000 mg total) by mouth 2 (two) times daily for 10 days. 40 capsule 0   ARIPiprazole (ABILIFY) 5 MG tablet Take 1 tablet (5 mg total) by mouth daily. (Patient not taking: Reported on 07/25/2023) 30 tablet 1   fluticasone (FLONASE) 50 MCG/ACT nasal spray Place 1 spray into both nostrils daily. 16 g 2   loratadine (CLARITIN) 10 MG tablet Take 1 tablet (10 mg total) by mouth daily. (Patient not taking: Reported on 07/25/2023) 30 tablet 1   Multiple Vitamin (MULTIVITAMIN WITH MINERALS) TABS tablet Take 1 tablet by mouth  daily. (Patient not taking: Reported on 07/25/2023)     predniSONE (DELTASONE) 10 MG tablet Take 6 tablets (60 mg total) by mouth daily for 1 day, THEN 5 tablets (50 mg total) daily for 1 day, THEN 4 tablets (40 mg total) daily for 1 day, THEN 3 tablets (30 mg total) daily for 1 day, THEN 2 tablets (20 mg total) daily for 1 day, THEN 1 tablet (10 mg total) daily for 1 day. Take with food. 21 tablet 0   sertraline (ZOLOFT) 50 MG tablet Take 3 tablets (150 mg total) by mouth daily. (Patient not taking: Reported on 07/25/2023) 90 tablet 1   No current facility-administered medications for this visit.     Musculoskeletal: Strength & Muscle Tone: within normal limits Gait & Station: normal Patient leans: N/A  Psychiatric Specialty Exam: Review of Systems  Psychiatric/Behavioral:  Positive for dysphoric mood and sleep disturbance. Negative for decreased concentration, hallucinations, self-injury and suicidal ideas. The patient is nervous/anxious. The patient is not hyperactive.     There were no vitals taken for this visit.There is no height or weight on file to calculate BMI.  General Appearance: Casual  Eye Contact:  Good  Speech:  Clear and Coherent and Normal Rate  Volume:  Normal  Mood:  Anxious and Depressed  Affect:  Congruent  Thought Process:  Coherent, Goal Directed, and Descriptions of Associations: Intact  Orientation:  Full (Time, Place, and Person)  Thought Content: WDL   Suicidal Thoughts:  No  Homicidal Thoughts:  No  Memory:  Immediate;   Good Recent;   Good Remote;   Fair  Judgement:  Good  Insight:  Good  Psychomotor Activity:  Normal  Concentration:  Concentration: Good and Attention Span: Good  Recall:  Good  Fund of Knowledge: Good  Language: Good  Akathisia:  No  Handed:  Right  AIMS (if indicated): not done  Assets:  Communication Skills Desire for Improvement Social Support Transportation  ADL's:  Intact  Cognition: WNL  Sleep:  Poor    Screenings: GAD-7    Flowsheet Row Video Visit from 07/26/2023 in [facility] Office Visit from 07/25/2023 in [facility] [place] [facility] [place]. [facility] Video Visit from 06/13/2023 in [facility] Video Visit from 04/10/2023 in [facility]  Center Video Visit from 02/06/2023 in [facility]  Total GAD-7 Score 16 17 13 2 1       PHQ2-9    Flowsheet Row Video Visit from 07/26/2023 in [facility] Office Visit from 07/25/2023 in [facility] [place] [facility] [place]. [facility] Video Visit from 06/13/2023 in [facility] Video Visit from 04/10/2023 in [facility] Video Visit from 02/06/2023 in [facility]  PHQ-2 Total Score 6 6 6  0 0  PHQ-9 Total Score 23 21 21  -- --      Flowsheet Row Video Visit from 07/26/2023 in [facility] Video Visit from 06/13/2023 in [facility] Video Visit from 04/10/2023 in [facility]  C-SSRS RISK CATEGORY Low Risk Low Risk No Risk        Assessment and Plan:   Brendan Ochoa is a 48 year old, Caucasian male with a past psychiatric history significant for generalized anxiety disorder, major depressive disorder and sleep disturbances who presents to [facility] via virtual video visit for follow-up and medication management.  Patient presents to the encounter stating that he has not been taking his medications regularly.  Patient also reports that he has been drinking excessively (10-12 beers per day).  Patient reports that he has not been taking his medications out of fear that he will "cross react with his excessive alcohol consumption.  Patient reports that he  experiences the following withdrawal symptoms when not drinking: headache, nausea, tremors, and anxiety.  Due to patient's excessive alcohol consumption, provider recommended patient admit himself for alcohol detox at [facility].  Patient was agreeable to recommendation.  Provider informed patient that once he went through detox, he would be placed on Vivitrol (long-acting injectable) for the management of his excessive alcohol consumption. Provider to reach out to patient to set him up with Vivitrol.  Collaboration of Care: Collaboration of Care: Medication Management AEB provider managing patient's psychiatric medications, Psychiatrist AEB patient being seen by a mental health provider at this facility, and Referral or follow-up with counselor/therapist AEB patient being seen by a therapist outside of this facility  Patient/Guardian was advised Release of Information must be obtained prior to any record release in order to collaborate their care with an outside provider. Patient/Guardian was advised if they have not already done so to contact the registration department to sign all necessary forms in order for [name] to release information regarding their care.   Consent: Patient/Guardian gives verbal consent for treatment and assignment of benefits for services provided during this visit. Patient/Guardian expressed understanding and agreed to proceed.   1. Alcohol use disorder, severe, dependence (HCC) (Primary) Provider discussed with patient about admitting himself for alcohol detox at [facility] Provider to reach out to patient to set him up with Vivitrol  2. Moderate episode of recurrent major depressive disorder (HCC) Patient to be placed back on his medications after detox  3. Generalized anxiety disorder Patient to be placed back on his medications after detox  Patient to follow-up in 6 weeks Provider spent a total of 23  minutes with the patient/reviewing patient's chart  Meta Hatchet, PA 07/26/2023, 1:53 PM

## 2023-07-26 NOTE — Progress Notes (Signed)
 Complex Care Management Note  Care Guide Note 07/26/2023 Name: Brendan Ochoa MRN: [id number] DOB: [birth year]  [name] is a 48 y.o. year old male who sees Marcine Matar, MD for primary care. I reached out to [name] by phone today to offer complex care management services.  [name] was given information about Complex Care Management services today including:   The Complex Care Management services include support from the care team which includes your Nurse Care Manager, Clinical Social Worker, or Pharmacist.  The Complex Care Management team is here to help remove barriers to the health concerns and goals most important to you. Complex Care Management services are voluntary, and the patient may decline or stop services at any time by request to their care team member.   Complex Care Management Consent Status: Patient agreed to services and verbal consent obtained.   Follow up plan:  Telephone appointment with complex care management team member scheduled for:  3/17  Encounter Outcome:  Patient Scheduled  [name]  [facility]  [facility], [facility] Guide  Direct Dial: [phone]  Fax [phone]

## 2023-07-27 ENCOUNTER — Ambulatory Visit (HOSPITAL_COMMUNITY)
Admission: EM | Admit: 2023-07-27 | Discharge: 2023-07-28 | Disposition: A | Discharge status: Nursing Home (Includes ICF) | Payer: Self-pay | Attending: Nurse Practitioner | Admitting: Nurse Practitioner

## 2023-07-27 DIAGNOSIS — F1020 Alcohol dependence, uncomplicated: Secondary | ICD-10-CM | POA: Insufficient documentation

## 2023-07-27 NOTE — Progress Notes (Signed)
 07/27/23 1947  Patient Reported Information  How Did You Hear About [name]? Self  What Is the Reason for Your Visit/Call Today? Pt reports, he's here for alcohol detox. Pt reports, he's had a beer two hours ago but no current withdrawal symptoms. Pt reports, he quit taking his psychiatric medications about 3 weeks ago. Pt also reports,  Marijuana use every now and then. Pt reports, symptoms of depression and anxiety. Pt denies, SI, HI, hallucinations, self-injurious behaviors and access to weapons.  How Long Has This Been Causing You Problems? > than 6 months  What Do You Feel Would Help You the Most Today? Alcohol or Drug Use Treatment;Medication(s);Treatment for Depression or other mood problem;Stress Management  Have You Recently Had Any Thoughts About Hurting Yourself? No  Are You Planning to Commit Suicide/Harm Yourself At This time? No  Have you Recently Had Thoughts About Hurting Someone [name]? No  Are You Planning To Harm Someone At This Time? No  Explanation: None.  Physical Abuse Denies  Verbal Abuse Denies  Sexual Abuse Denies  Exploitation of patient/patient's resources Denies  Self-Neglect Denies  Possible abuse reported to: Other (Comment) (None.)  Have You Used Any Alcohol or Drugs in the Past 24 Hours? Yes  What Did You Use and How Much? Pt reports, he drank a beer about two hours ago.  Do You Currently Have a Therapist/Psychiatrist? Yes  Name of Therapist/Psychiatrist Pt is linked to [name], PA at [facility]) for medication management.  Have You Been Recently Discharged From Any Office Practice or Programs? No  [facility] Screening Triage Referral Assessment  Type of Contact Face-to-Face  Location of Assessment [facility] Assessment Services  Provider location [facility] Assessment Services  Collateral Involvement None.  Does Patient Have a Automotive engineer Guardian? No  Legal Guardian Contact Information Pt is his own guardian.   Copy of Legal Guardianship Form in Chart  (Pt is his own guardian.)  Legal Guardian Notified of Arrival   (Pt is his own guardian.)  Legal Guardian Notified of Pending Discharge   (Pt is his own guardian.)  If Minor and Not Living with Parent(s), Who has Custody? Pt is an adult.  Is CPS involved or ever been involved? Never  Is APS involved or ever been involved? Never  Patient Determined To Be At Risk for Harm To Self or Others Based on Review of Patient Reported Information or Presenting Complaint? No  Method No Plan  Availability of Means No access or NA  Intent Vague intent or NA  Notification Required No need or identified person  Additional Information for Danger to Others Potential  (None.)  Additional Comments for Danger to Others Potential None.  Are There Guns or Other Weapons in Your Home? No  Types of Guns/Weapons Pt smirked then said "no."  Are These Weapons Safely Secured?  (NA)  Who Could Verify You Are Able To Have These Secured: NA  Do You Have any Outstanding Charges, Pending Court Dates, Parole/Probation? Pt denies.  Contacted To Inform of Risk of Harm To Self or Others: Other: Comment  Does Patient Present under Involuntary Commitment? No  [place] of Residence [name]  Patient Currently Receiving the Following Services: Medication Management  Determination of Need Urgent (48 hours)  Options For Referral Facility-Based Crisis;BH Urgent Care;Medication Management;Outpatient Therapy;Inpatient Hospitalization    Flowsheet Row ED from 07/27/2023 in [facility] Video Visit from 07/26/2023 in [facility] Video Visit  from 06/13/2023 in [facility]  C-SSRS RISK CATEGORY No Risk Low Risk Low Risk      Determination of need: Urgent.    Redmond Pulling, MS, [facility], [facility] Triage Specialist [phone]

## 2023-07-27 NOTE — BH Assessment (Incomplete)
 Comprehensive Clinical Assessment (CCA) Note  DUPLICATE NOTE, DISREGARD.   07/27/2023 Brendan Ochoa [id number]  Disposition: Brendan Bering, NP recommends pt to be admitted to [facility] Based Crisis unit.   The patient demonstrates the following risk factors for suicide: Chronic risk factors for suicide include: psychiatric disorder of Major Depressive Disorder, moderate and substance use disorder. Acute risk factors for suicide include: N/A. Protective factors for this patient include:  None . Considering these factors, the overall suicide risk at this point appears to be no risk. Patient is not appropriate for outpatient follow up.  Brendan Ochoa is a 48 year old male who presents voluntary and accompanied to [facility] ([facility] Clinician asked the pt, "what brought you to the hospital?" Pt reports, he's been drinking everyday for the past three weeks. Pt reports, a lot of stuff triggers his alcohol use.    Chief Complaint: No chief complaint on file.  Visit Diagnosis:     CCA Screening, Triage and Referral (STR)  Patient Reported Information How did you hear about [name]? Self  What Is the Reason for Your Visit/Call Today? Pt reports, he's here for alcohol detox. Pt reports, he's had a beer two hours ago but no current withdrawal symptoms. Pt reports, he quit taking his psychiatric medications about 3 weeks ago. Pt also reports,  Marijuana use every now and then. Pt reports, symptoms of depression and anxiety. Pt denies, SI, HI, hallucinations, self-injurious behaviors and access to weapons.  How Long Has This Been Causing You Problems? > than 6 months  What Do You Feel Would Help You the Most Today? Alcohol or Drug Use Treatment; Medication(s); Treatment for Depression or other mood problem; Stress Management   Have You Recently Had Any Thoughts About Hurting Yourself? No  Are You Planning to Commit Suicide/Harm Yourself At This time?  No   Flowsheet Row ED from 07/27/2023 in [facility] Video Visit from 07/26/2023 in [facility] Video Visit from 06/13/2023 in [facility]  C-SSRS RISK CATEGORY No Risk Low Risk Low Risk       Have you Recently Had Thoughts About Hurting Someone Brendan Ochoa? No  Are You Planning to Harm Someone at This Time? No  Explanation: None.   Have You Used Any Alcohol or Drugs in the Past 24 Hours? Yes  How Long Ago Did You Use Drugs or Alcohol? No data recorded What Did You Use and How Much? Pt reports, he drank a beer about two hours ago.   Do You Currently Have a Therapist/Psychiatrist? Yes  Name of Therapist/Psychiatrist: Name of Therapist/Psychiatrist: Pt is linked to Brendan Back, PA at [facility]) for medication management.   Have You Been Recently Discharged From Any Office Practice or Programs? No  Explanation of Discharge From Practice/Program: No data recorded    CCA Screening Triage Referral Assessment Type of Contact: Face-to-Face  Telemedicine Service Delivery:   Is this Initial or Reassessment?   Date Telepsych consult ordered in [facility]:    Time Telepsych consult ordered in [facility]:    Location of Assessment: [facility] Services  Provider Location: [facility] [place] [facility]   Collateral Involvement: None.   Does Patient Have a Automotive engineer Guardian? No  Legal Guardian Contact Information: Pt is his own guardian.  Copy of Legal Guardianship Form: -- (Pt is his own guardian.)  Legal Guardian Notified of Arrival: -- (Pt is his  own guardian.)  Legal Guardian Notified of Pending Discharge: -- (Pt is his own guardian.)  If Minor and Not Living with Parent(s), Who has Custody? Pt is an adult.  Is CPS involved or ever been involved? Never  Is APS involved or ever been involved? Never   Patient Determined To Be At Risk for Harm  To Self or Others Based on Review of Patient Reported Information or Presenting Complaint? No  Method: No Plan  Availability of Means: No access or NA  Intent: Vague intent or NA  Notification Required: No need or identified person  Additional Information for Danger to Others Potential: -- (None.)  Additional Comments for Danger to Others Potential: None.  Are There Guns or Other Weapons in Your Home? No  Types of Guns/Weapons: Pt smirked then said "no."  Are These Weapons Safely Secured?                            -- (NA)  Who Could Verify You Are Able To Have These Secured: NA  Do You Have any Outstanding Charges, Pending Court Dates, Parole/Probation? Pt denies.  Contacted To Inform of Risk of Harm To Self or Others: Other: Comment    Does Patient Present under Involuntary Commitment? No    [place] of Residence: Brendan Ochoa   Patient Currently Receiving the Following Services: Medication Management   Determination of Need: Urgent (48 hours)   Options For Referral: Facility-Based Crisis; [facility]; Medication Management; Outpatient Therapy; Inpatient Hospitalization     CCA Biopsychosocial Patient Reported Schizophrenia/Schizoaffective Diagnosis in Past: No   Strengths: Pt is seeking hwelp with alcoholism.   Mental Health Symptoms Depression:  Sleep (too much or little); Worthlessness; Hopelessness; Tearfulness; Difficulty Concentrating; Increase/decrease in appetite   Duration of Depressive symptoms: Duration of Depressive Symptoms: Greater than two weeks   Mania:  None   Anxiety:   Difficulty concentrating; Worrying   Psychosis:  None   Duration of Psychotic symptoms:    Trauma:  None   Obsessions:  None   Compulsions:  None   Inattention:  Forgetful; Loses things   Hyperactivity/Impulsivity:  None   Oppositional/Defiant Behaviors:  None   Emotional Irregularity:  None   Other Mood/Personality Symptoms:  None.    Mental Status  Exam Appearance and self-care  Stature:  Average   Weight:  Average weight   Clothing:  Casual   Grooming:  Normal   Cosmetic use:  None   Posture/gait:  Normal   Motor activity:  Not Remarkable   Sensorium  Attention:  Normal   Concentration:  Normal   Orientation:  X5   Recall/memory:  Normal   Affect and Mood  Affect:  No data recorded  Mood:  No data recorded  Relating  Eye contact:  Normal   Facial expression:  Responsive   Attitude toward examiner:  Cooperative   Thought and Language  Speech flow: Normal   Thought content:  Appropriate to Mood and Circumstances   Preoccupation:  None   Hallucinations:  None   Organization:  Coherent   [employer] of Knowledge:  Fair   Intelligence:  No data recorded  Abstraction:  Functional   Judgement:  Poor   Reality Testing:  Adequate   Insight:  Fair   Decision Making:  Impulsive   Social Functioning  Social Maturity:  Impulsive   Social Judgement:  Heedless   Stress  Stressors:  Other (Comment) (Pt reports,  he worried about his life.)   Coping Ability:  Overwhelmed   Skill Deficits:  Decision making   Supports:  Family     Religion: Religion/Spirituality Are You A Religious Person?: Yes What is Your Religious Affiliation?: Catholic How Might This Affect Treatment?: None.  Leisure/Recreation: Leisure / Recreation Do You Have Hobbies?: Yes Leisure and Hobbies: Naval architect, fishing.  Exercise/Diet: Exercise/Diet Do You Exercise?: No Have You Gained or Lost A Significant Amount of Weight in the Past Six Months?: No Do You Follow a Special Diet?: No Do You Have Any Trouble Sleeping?: Yes Explanation of Sleeping Difficulties: Pt reports, he doesn't really sleep.   CCA Employment/Education Employment/Work Situation: Employment / Work Situation Employment Situation: Unemployed Patient's Job has Been Impacted by Current Illness: No Has Patient ever Been in Product manager?: No  Education: Education Is Patient Currently Attending School?:  (GED) Last Grade Completed: 12 Did You Attend College?: Yes What Type of College Degree Do you Have?: Pt attending [employer] studying Carpentry. Did You Have An Individualized Education Program (IIEP): No Did You Have Any Difficulty At School?: No Patient's Education Has Been Impacted by Current Illness: No   CCA Family/Childhood History Family and Relationship History: Family history Marital status: Single Does patient have children?: No  Childhood History:  Childhood History By whom was/is the patient raised?: Both parents Did patient suffer any verbal/emotional/physical/sexual abuse as a child?: No Did patient suffer from severe childhood neglect?: No Has patient ever been sexually abused/assaulted/raped as an adolescent or adult?: No Was the patient ever a victim of a crime or a disaster?: No Witnessed domestic violence?: No Has patient been affected by domestic violence as an adult?: No   CCA Substance Use Alcohol/Drug Use: Alcohol / Drug Use Pain Medications: See MAR Prescriptions: See MAR Over the Counter: See MAR History of alcohol / drug use?: Yes Longest period of sobriety (when/how long): Per chart, 2 years sober approximately 10 years ago. Pt reports, most recently 6 months. Negative Consequences of Use: Personal relationships Withdrawal Symptoms: None Substance #1 Name of Substance 1: Alcohol. 1 - Age of First Use: 22. 1 - Amount (size/oz): Pt reports, he drank a beer about two hours ago. 1 - Frequency: Everyday. 1 - Duration: Ongoing. 1 - Last Use / Amount: Today (07/27/2023). 1 - Method of Aquiring: Pt reports, he gets money a lot of ways but declined to share. 1- Route of Use: Oral.    ASAM's:  Six Dimensions of Multidimensional Assessment  Dimension 1:  Acute Intoxication and/or Withdrawal Potential:   Dimension 1:  Description of individual's past and current experiences of  substance use and withdrawal: None.  Dimension 2:  Biomedical Conditions and Complications:   Dimension 2:  Description of patient's biomedical conditions and  complications: None.  Dimension 3:  Emotional, Behavioral, or Cognitive Conditions and Complications:  Dimension 3:  Description of emotional, behavioral, or cognitive conditions and complications: Pt has a history of alcoholism, symptoms of depression and anxiety. Pt has previous diagnoses of: Moderate episode of recurrent major depressive disorder (HCC), GAD, Alcohol use disorder, severe, dependence (HCC), Cocaine use disorder, severe, dependence (HCC), Alcohol abuse with withdrawal (HCC).  Dimension 4:  Readiness to Change:  Dimension 4:  Description of Readiness to Change criteria: Pt is seeking detox from alcohol.  Dimension 5:  Relapse, Continued use, or Continued Problem Potential:  Dimension 5:  Relapse, continued use, or continued problem potential critiera description: Pt has ongoing use.  Dimension 6:  Recovery/Living Environment:  Dimension 6:  Recovery/Iiving environment criteria description: Pt reports, he is homless and bounces from place to place.  ASAM Severity Score: ASAM's Severity Rating Score: 7  ASAM Recommended Level of Treatment: ASAM Recommended Level of Treatment: Level I Outpatient Treatment   Substance use Disorder (SUD) Substance Use Disorder (SUD)  Checklist Symptoms of Substance Use: Continued use despite having a persistent/recurrent physical/psychological problem caused/exacerbated by use, Continued use despite persistent or recurrent social, interpersonal problems, caused or exacerbated by use  Recommendations for Services/Supports/Treatments: Recommendations for Services/Supports/Treatments Recommendations For Services/Supports/Treatments: Facility Based Crisis  Disposition Recommendation per psychiatric provider: Pt to be admitted to [facility] Based Crisis unit.    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     Referrals to Alternative Service(s): Referred to Alternative Service(s):   Place:   Date:   Time:    Referred to Alternative Service(s):   Place:   Date:   Time:    Referred to Alternative Service(s):   Place:   Date:   Time:    Referred to Alternative Service(s):   Place:   Date:   Time:     Redmond Pulling, LCMHCComprehensive Clinical Assessment (CCA) Screening, Triage and Referral Note  07/27/2023 Anquan Azzarello Heisler [id number]  Chief Complaint: No chief complaint on file.  Visit Diagnosis:   Patient Reported Information How did you hear about [name]? Self  What Is the Reason for Your Visit/Call Today? Pt reports, he's here for alcohol detox. Pt reports, he's had a beer two hours ago but no current withdrawal symptoms. Pt reports, he quit taking his psychiatric  medications about 3 weeks ago. Pt also reports,  Marijuana use every now and then. Pt reports, symptoms of depression and anxiety. Pt denies, SI, HI, hallucinations, self-injurious behaviors and access to weapons.  How Long Has This Been Causing You Problems? > than 6 months  What Do You Feel Would Help You the Most Today? Alcohol or Drug Use Treatment; Medication(s); Treatment for Depression or other mood problem; Stress Management   Have You Recently Had Any Thoughts About Hurting Yourself? No  Are You Planning to Commit Suicide/Harm Yourself At This time? No   Have you Recently Had Thoughts About Hurting Someone Brendan Ochoa? No  Are You Planning to Harm Someone at This Time? No  Explanation: None.   Have You Used Any Alcohol or Drugs in the Past 24 Hours? Yes  How Long Ago Did You Use Drugs or Alcohol? No data recorded What Did You Use and How Much? Pt reports, he drank a beer about two hours ago.   Do You Currently Have a Therapist/Psychiatrist? Yes  Name of Therapist/Psychiatrist: Pt is linked to Brendan Back, PA at [facility]) for medication management.   Have You Been Recently Discharged From Any Office Practice or Programs? No  Explanation of Discharge From Practice/Program: No data recorded   CCA Screening Triage Referral Assessment Type of Contact: Face-to-Face  Telemedicine Service Delivery:   Is this Initial or Reassessment?   Date Telepsych consult ordered in [facility]:    Time Telepsych consult ordered in [facility]:    Location of Assessment: [facility]  Provider Location: [facility] Services    Collateral Involvement: None.   Does Patient Have a Automotive engineer Guardian? No data recorded Name and Contact of Legal Guardian: No data recorded If Minor and Not Living with Parent(s), Who has Custody? Pt is an adult.  Is CPS involved or ever been involved? Never  Is APS involved or ever been involved?  Never   Patient Determined To Be At Risk for Harm To Self or Others Based on Review of Patient Reported Information or Presenting Complaint? No  Method: No Plan  Availability of Means: No access or NA  Intent: Vague intent or NA  Notification Required: No need or identified person  Additional Information for Danger to Others Potential: -- (None.)  Additional Comments for Danger to Others Potential: None.  Are There Guns or Other Weapons in Your Home? No  Types of Guns/Weapons: Pt smirked then said "no."  Are These Weapons Safely Secured?                            -- (NA)  Who Could Verify You Are Able To Have These Secured: NA  Do You Have any Outstanding Charges, Pending Court Dates, Parole/Probation? Pt denies.  Contacted To Inform of Risk of Harm To Self or Others: Other: Comment   Does Patient Present under Involuntary Commitment? No    [place] of Residence: Brendan Ochoa   Patient Currently Receiving the Following Services: Medication Management   Determination of Need: Urgent (48 hours)   Options For Referral: Facility-Based Crisis; [facility]; Medication Management; Outpatient Therapy; Inpatient Hospitalization   Disposition Recommendation per psychiatric provider: Pt to be admitted to [facility] Based Crisis unit.   Redmond Pulling, [facility]   Redmond Pulling, MS, [facility], [facility] Triage Specialist [phone]

## 2023-07-28 ENCOUNTER — Other Ambulatory Visit (HOSPITAL_COMMUNITY)
Admission: EM | Admit: 2023-07-28 | Discharge: 2023-07-29 | Disposition: A | Discharge status: Home / Self Care | Payer: Self-pay | Attending: Psychiatry | Admitting: Psychiatry

## 2023-07-28 DIAGNOSIS — F321 Major depressive disorder, single episode, moderate: Secondary | ICD-10-CM | POA: Diagnosis not present

## 2023-07-28 DIAGNOSIS — F1094 Alcohol use, unspecified with alcohol-induced mood disorder: Secondary | ICD-10-CM | POA: Diagnosis not present

## 2023-07-28 DIAGNOSIS — F1090 Alcohol use, unspecified, uncomplicated: Secondary | ICD-10-CM | POA: Diagnosis present

## 2023-07-28 LAB — CBC WITH DIFFERENTIAL/PLATELET
Abs Immature Granulocytes: 0.01 10*3/uL (ref 0.00–0.07)
Basophils Absolute: 0 10*3/uL (ref 0.0–0.1)
Basophils Relative: 1 %
Eosinophils Absolute: 0.1 10*3/uL (ref 0.0–0.5)
Eosinophils Relative: 1 %
HCT: 43 % (ref 39.0–52.0)
Hemoglobin: 14.9 g/dL (ref 13.0–17.0)
Immature Granulocytes: 0 %
Lymphocytes Relative: 52 %
Lymphs Abs: 2.8 10*3/uL (ref 0.7–4.0)
MCH: 31.1 pg (ref 26.0–34.0)
MCHC: 34.7 g/dL (ref 30.0–36.0)
MCV: 89.8 fL (ref 80.0–100.0)
Monocytes Absolute: 0.4 10*3/uL (ref 0.1–1.0)
Monocytes Relative: 6 %
Neutro Abs: 2.2 10*3/uL (ref 1.7–7.7)
Neutrophils Relative %: 40 %
Platelets: 242 10*3/uL (ref 150–400)
RBC: 4.79 MIL/uL (ref 4.22–5.81)
RDW: 13.2 % (ref 11.5–15.5)
WBC: 5.4 10*3/uL (ref 4.0–10.5)
nRBC: 0 % (ref 0.0–0.2)

## 2023-07-28 LAB — COMPREHENSIVE METABOLIC PANEL
ALT: 27 U/L (ref 0–44)
AST: 44 U/L — ABNORMAL HIGH (ref 15–41)
Albumin: 3.6 g/dL (ref 3.5–5.0)
Alkaline Phosphatase: 63 U/L (ref 38–126)
Anion gap: 15 (ref 5–15)
BUN: 13 mg/dL (ref 6–20)
CO2: 23 mmol/L (ref 22–32)
Calcium: 9.1 mg/dL (ref 8.9–10.3)
Chloride: 101 mmol/L (ref 98–111)
Creatinine, Ser: 0.79 mg/dL (ref 0.61–1.24)
GFR, Estimated: >60 mL/min (ref >60)
Glucose, Bld: 91 mg/dL (ref 70–99)
Potassium: 3.8 mmol/L (ref 3.5–5.1)
Sodium: 139 mmol/L (ref 135–145)
Total Bilirubin: 0.5 mg/dL (ref 0.0–1.2)
Total Protein: 6.2 g/dL — ABNORMAL LOW (ref 6.5–8.1)

## 2023-07-28 LAB — LIPID PANEL
Cholesterol: 184 mg/dL (ref 0–200)
HDL: 69 mg/dL (ref >40)
LDL Cholesterol: UNDETERMINED mg/dL (ref 0–99)
Total CHOL/HDL Ratio: 2.7 ratio
Triglycerides: 544 mg/dL — ABNORMAL HIGH (ref <150)
VLDL: UNDETERMINED mg/dL (ref 0–40)

## 2023-07-28 LAB — TSH: TSH: 2.054 u[IU]/mL (ref 0.350–4.500)

## 2023-07-28 LAB — POCT URINE DRUG SCREEN - MANUAL ENTRY (I-SCREEN)
POC Amphetamine UR: NOT DETECTED
POC Buprenorphine (BUP): NOT DETECTED
POC Cocaine UR: NOT DETECTED
POC Marijuana UR: POSITIVE — AB
POC Methadone UR: NOT DETECTED
POC Methamphetamine UR: NOT DETECTED
POC Morphine: NOT DETECTED
POC Oxazepam (BZO): NOT DETECTED
POC Oxycodone UR: NOT DETECTED
POC Secobarbital (BAR): NOT DETECTED

## 2023-07-28 LAB — ETHANOL: Alcohol, Ethyl (B): 154 mg/dL — ABNORMAL HIGH (ref <10)

## 2023-07-28 LAB — LDL CHOLESTEROL, DIRECT: Direct LDL: 65 mg/dL (ref 0–99)

## 2023-07-28 LAB — MAGNESIUM: Magnesium: 2.1 mg/dL (ref 1.7–2.4)

## 2023-07-28 MED ORDER — LORAZEPAM 1 MG PO TABS: 1.0000 mg | ORAL_TABLET | Freq: Four times a day (QID) | ORAL | Status: DC | PRN

## 2023-07-28 MED ORDER — ALUM & MAG HYDROXIDE-SIMETH 200-200-20 MG/5ML PO SUSP: 30.0000 mL | ORAL | Status: DC | PRN

## 2023-07-28 MED ORDER — HALOPERIDOL LACTATE 5 MG/ML IJ SOLN: 10.0000 mg | Freq: Three times a day (TID) | INTRAMUSCULAR | Status: DC | PRN

## 2023-07-28 MED ORDER — DIPHENHYDRAMINE HCL 50 MG PO CAPS: 50.0000 mg | ORAL_CAPSULE | Freq: Three times a day (TID) | ORAL | Status: DC | PRN

## 2023-07-28 MED ORDER — CHLORDIAZEPOXIDE HCL 25 MG PO CAPS: 25.0000 mg | ORAL_CAPSULE | Freq: Every day | ORAL | Status: DC

## 2023-07-28 MED ORDER — LORAZEPAM 2 MG/ML IJ SOLN: 2.0000 mg | Freq: Three times a day (TID) | INTRAMUSCULAR | Status: DC | PRN

## 2023-07-28 MED ORDER — LORAZEPAM 1 MG PO TABS: 1.0000 mg | ORAL_TABLET | Freq: Three times a day (TID) | ORAL | Status: DC

## 2023-07-28 MED ORDER — LOPERAMIDE HCL 2 MG PO CAPS
2.0000 mg | ORAL_CAPSULE | ORAL | Status: DC | PRN
  Administered 2023-07-29: 4 mg via ORAL
  Filled 2023-07-28: qty 2

## 2023-07-28 MED ORDER — THIAMINE MONONITRATE 100 MG PO TABS
100.0000 mg | ORAL_TABLET | Freq: Every day | ORAL | Status: DC
  Administered 2023-07-29: 100 mg via ORAL
  Filled 2023-07-28: qty 1

## 2023-07-28 MED ORDER — MAGNESIUM HYDROXIDE 400 MG/5ML PO SUSP: 30.0000 mL | Freq: Every day | ORAL | Status: DC | PRN

## 2023-07-28 MED ORDER — HALOPERIDOL 5 MG PO TABS: 5.0000 mg | ORAL_TABLET | Freq: Three times a day (TID) | ORAL | Status: DC | PRN

## 2023-07-28 MED ORDER — DIPHENHYDRAMINE HCL 50 MG/ML IJ SOLN: 50.0000 mg | Freq: Three times a day (TID) | INTRAMUSCULAR | Status: DC | PRN

## 2023-07-28 MED ORDER — LORAZEPAM 1 MG PO TABS: 1.0000 mg | ORAL_TABLET | Freq: Two times a day (BID) | ORAL | Status: DC

## 2023-07-28 MED ORDER — CHLORDIAZEPOXIDE HCL 25 MG PO CAPS
25.0000 mg | ORAL_CAPSULE | Freq: Four times a day (QID) | ORAL | Status: AC
  Administered 2023-07-28 (×3): 25 mg via ORAL
  Filled 2023-07-28 (×3): qty 1

## 2023-07-28 MED ORDER — ONDANSETRON 4 MG PO TBDP
4.0000 mg | ORAL_TABLET | Freq: Four times a day (QID) | ORAL | Status: DC | PRN
  Administered 2023-07-28: 4 mg via ORAL
  Filled 2023-07-28: qty 1

## 2023-07-28 MED ORDER — LORAZEPAM 1 MG PO TABS: 1.0000 mg | ORAL_TABLET | Freq: Every day | ORAL | Status: DC

## 2023-07-28 MED ORDER — THIAMINE HCL 100 MG/ML IJ SOLN
100.0000 mg | Freq: Once | INTRAMUSCULAR | Status: AC
  Administered 2023-07-28: 100 mg via INTRAMUSCULAR
  Filled 2023-07-28: qty 2

## 2023-07-28 MED ORDER — CHLORDIAZEPOXIDE HCL 25 MG PO CAPS: 25.0000 mg | ORAL_CAPSULE | ORAL | Status: DC

## 2023-07-28 MED ORDER — HALOPERIDOL LACTATE 5 MG/ML IJ SOLN: 5.0000 mg | Freq: Three times a day (TID) | INTRAMUSCULAR | Status: DC | PRN

## 2023-07-28 MED ORDER — CHLORDIAZEPOXIDE HCL 25 MG PO CAPS: 25.0000 mg | ORAL_CAPSULE | Freq: Four times a day (QID) | ORAL | Status: DC | PRN

## 2023-07-28 MED ORDER — HYDROXYZINE HCL 25 MG PO TABS
25.0000 mg | ORAL_TABLET | Freq: Four times a day (QID) | ORAL | Status: DC | PRN
  Administered 2023-07-28: 25 mg via ORAL
  Filled 2023-07-28: qty 1

## 2023-07-28 MED ORDER — LORAZEPAM 1 MG PO TABS
1.0000 mg | ORAL_TABLET | Freq: Four times a day (QID) | ORAL | Status: DC
  Administered 2023-07-28: 1 mg via ORAL
  Filled 2023-07-28: qty 1

## 2023-07-28 MED ORDER — CHLORDIAZEPOXIDE HCL 25 MG PO CAPS
25.0000 mg | ORAL_CAPSULE | Freq: Three times a day (TID) | ORAL | Status: DC
  Administered 2023-07-29: 25 mg via ORAL
  Filled 2023-07-28 (×2): qty 1

## 2023-07-28 MED ORDER — HYDROXYZINE HCL 25 MG PO TABS: 25.0000 mg | ORAL_TABLET | Freq: Three times a day (TID) | ORAL | Status: DC | PRN

## 2023-07-28 MED ORDER — ACETAMINOPHEN 325 MG PO TABS
650.0000 mg | ORAL_TABLET | Freq: Four times a day (QID) | ORAL | Status: DC | PRN
  Administered 2023-07-29: 650 mg via ORAL
  Filled 2023-07-28: qty 2

## 2023-07-28 MED ORDER — ADULT MULTIVITAMIN W/MINERALS CH
1.0000 | ORAL_TABLET | Freq: Every day | ORAL | Status: DC
  Administered 2023-07-28 – 2023-07-29 (×2): 1 via ORAL
  Filled 2023-07-28 (×2): qty 1

## 2023-07-28 MED ORDER — TRAZODONE HCL 50 MG PO TABS
50.0000 mg | ORAL_TABLET | Freq: Every evening | ORAL | Status: DC | PRN
  Administered 2023-07-28: 50 mg via ORAL
  Filled 2023-07-28: qty 1

## 2023-07-28 NOTE — ED Notes (Signed)
 Patient is sleeping. Respirations equal and unlabored, skin warm and dry. No change in assessment or acuity. Routine safety checks conducted according to facility protocol. Will continue to monitor for safety.

## 2023-07-28 NOTE — Group Note (Signed)
 Group Topic: Balance in Life  Group Date: 07/28/2023 Start Time: 1200 End Time: 1230 Facilitators: Concha Norway, NT  Department: [facility]  Number of Participants: 7  Group Focus: acceptance Treatment Modality:  Behavior Modification Therapy Interventions utilized were clarification Purpose: enhance coping skills  Name: Brendan Ochoa Date of Birth: Feb 18, 1976  MR: [id number]    Level of Participation: minimal Quality of Participation: attentive Interactions with others: gave feedback Mood/Affect: appropriate Triggers (if applicable):  Cognition: concrete Progress: Moderate Response:  Plan: follow-up needed  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

## 2023-07-28 NOTE — ED Provider Notes (Signed)
 Behavioral Health Progress Note  Date and Time: 07/28/2023 8:43 AM Name: Brendan Ochoa MRN:  [id number]  Subjective:  Brendan Ochoa is a 48 y.o. male with a psychiatric hx of MDD, GAD, sleep disturbances, alcohol use disorder, and a remote hx of stimulant use disorder who was admitted to [facility] voluntarily for alcohol detox.   Diagnosis:  Final diagnoses:  Alcohol use disorder    Total Time spent with patient: {Time; 15 min - 8 hours:17441}  Past Psychiatric History: Patient endorses a past history of bipolar disorder. Patient has also been diagnosed with major depressive disorder. Patient struggles with alcohol abuse and has also struggled with drug abuse in the past    Past Medical History:  Past Medical History:  Diagnosis Date   Asthma    Cocaine abuse (HCC)    in past   Crohn disease (HCC) 2008   Depression    in past, not current as of 2019    ETOH abuse    history of alcohol use   GERD (gastroesophageal reflux disease)     Family History:  Family History  Problem Relation Age of Onset   Stomach cancer Maternal Grandfather    Colon cancer Neg Hx     Family Psychiatric  History: Grandfather (maternal) - Bipolar disorder   Patient reports that he is unsure of anyone else with psychiatric issues and states that his mother's side of the family has a history of mental illness.   Family history of suicide: Great uncle (maternal)   Patient denies a family history of homicide   Substance abuse, patient states that 90% of of his cousins either abused alcohol or drugs Social History:  Social History   Socioeconomic History   Marital status: Single    Spouse name: Not on file   Number of children: Not on file   Years of education: Not on file   Highest education level: Not on file  Occupational History   Not on file  Tobacco Use   Smoking status: Every Day    Current packs/day: 0.50    Average packs/day: 0.5 packs/day for 15.0 years (7.5 ttl pk-yrs)     Types: Cigarettes   Smokeless tobacco: Never   Tobacco comments:    1/4 to 1/2 pack per day   Vaping Use   Vaping status: Never Used  Substance and Sexual Activity   Alcohol use: Not Currently    Comment: alcoholic- last use 11/10/20   Drug use: Not Currently    Types: Cocaine, Marijuana, Methamphetamines    Comment: last cocaine use 10/10/20.  last meth april 2022   Sexual activity: Not on file  Other Topics Concern   Not on file  Social History Narrative   Not on file   Social Drivers of Health   Financial Resource Strain: Not on file  Food Insecurity: Food Insecurity Present (07/28/2023)   Hunger Vital Sign    Worried About Running Out of Food in the Last Year: Sometimes true    Ran Out of Food in the Last Year: Sometimes true  Transportation Needs: No Transportation Needs (07/28/2023)   PRAPARE - Administrator, Civil Service (Medical): No    Lack of Transportation (Non-Medical): No  Physical Activity: Not on file  Stress: Not on file  Social Connections: Not on file  Intimate Partner Violence: Not At Risk (07/28/2023)   Humiliation, Afraid, Rape, and Kick questionnaire    Fear of Current or Ex-Partner: No  Emotionally Abused: No    Physically Abused: No    Sexually Abused: No     Additional Social History:                         Sleep: {BHH GOOD/FAIR/POOR:22877}  Appetite:  {BHH GOOD/FAIR/POOR:22877}  Current Medications:  Current Facility-Administered Medications  Medication Dose Route Frequency Provider Last Rate Last Admin   acetaminophen (TYLENOL) tablet 650 mg  650 mg Oral Q6H PRN Bobbitt, Shalon E, NP       alum & mag hydroxide-simeth (MAALOX/MYLANTA) 200-200-20 MG/5ML suspension 30 mL  30 mL Oral Q4H PRN Bobbitt, Shalon E, NP       haloperidol (HALDOL) tablet 5 mg  5 mg Oral TID PRN Bobbitt, Shalon E, NP       And   diphenhydrAMINE (BENADRYL) capsule 50 mg  50 mg Oral TID PRN Bobbitt, Shalon E, NP       haloperidol lactate (HALDOL)  injection 5 mg  5 mg Intramuscular TID PRN Bobbitt, Shalon E, NP       And   diphenhydrAMINE (BENADRYL) injection 50 mg  50 mg Intramuscular TID PRN Bobbitt, Shalon E, NP       And   LORazepam (ATIVAN) injection 2 mg  2 mg Intramuscular TID PRN Bobbitt, Shalon E, NP       haloperidol lactate (HALDOL) injection 10 mg  10 mg Intramuscular TID PRN Bobbitt, Shalon E, NP       And   diphenhydrAMINE (BENADRYL) injection 50 mg  50 mg Intramuscular TID PRN Bobbitt, Shalon E, NP       And   LORazepam (ATIVAN) injection 2 mg  2 mg Intramuscular TID PRN Bobbitt, Franchot Mimes, NP       [START ON 07/31/2023] hydrOXYzine (ATARAX) tablet 25 mg  25 mg Oral TID PRN Bobbitt, Shalon E, NP       hydrOXYzine (ATARAX) tablet 25 mg  25 mg Oral Q6H PRN Bobbitt, Shalon E, NP       loperamide (IMODIUM) capsule 2-4 mg  2-4 mg Oral PRN Bobbitt, Shalon E, NP       LORazepam (ATIVAN) tablet 1 mg  1 mg Oral Q6H PRN Bobbitt, Shalon E, NP       LORazepam (ATIVAN) tablet 1 mg  1 mg Oral QID Bobbitt, Shalon E, NP       Followed by   Melene Muller ON 07/29/2023] LORazepam (ATIVAN) tablet 1 mg  1 mg Oral TID Bobbitt, Shalon E, NP       Followed by   Melene Muller ON 07/30/2023] LORazepam (ATIVAN) tablet 1 mg  1 mg Oral BID Bobbitt, Shalon E, NP       Followed by   Melene Muller ON 08/01/2023] LORazepam (ATIVAN) tablet 1 mg  1 mg Oral Daily Bobbitt, Shalon E, NP       magnesium hydroxide (MILK OF MAGNESIA) suspension 30 mL  30 mL Oral Daily PRN Bobbitt, Shalon E, NP       multivitamin with minerals tablet 1 tablet  1 tablet Oral Daily Bobbitt, Shalon E, NP       ondansetron (ZOFRAN-ODT) disintegrating tablet 4 mg  4 mg Oral Q6H PRN Bobbitt, Shalon E, NP       [START ON 07/29/2023] thiamine (VITAMIN B1) tablet 100 mg  100 mg Oral Daily Bobbitt, Shalon E, NP       traZODone (DESYREL) tablet 50 mg  50 mg Oral QHS PRN Bobbitt, Franchot Mimes, NP  Current Outpatient Medications  Medication Sig Dispense Refill   albuterol (VENTOLIN HFA) 108 (90 Base) MCG/ACT  inhaler Inhale 2 puffs into the lungs every 6 (six) hours as needed for wheezing or shortness of breath. (Patient not taking: Reported on 07/25/2023) 8 g 1   amoxicillin (AMOXIL) 500 MG capsule Take 2 capsules (1,000 mg total) by mouth 2 (two) times daily for 10 days. 40 capsule 0   ARIPiprazole (ABILIFY) 5 MG tablet Take 1 tablet (5 mg total) by mouth daily. (Patient not taking: Reported on 07/25/2023) 30 tablet 1   fluticasone (FLONASE) 50 MCG/ACT nasal spray Place 1 spray into both nostrils daily. 16 g 2   loratadine (CLARITIN) 10 MG tablet Take 1 tablet (10 mg total) by mouth daily. (Patient not taking: Reported on 07/25/2023) 30 tablet 1   Multiple Vitamin (MULTIVITAMIN WITH MINERALS) TABS tablet Take 1 tablet by mouth daily. (Patient not taking: Reported on 07/25/2023)     predniSONE (DELTASONE) 10 MG tablet Take 6 tablets (60 mg total) by mouth daily for 1 day, THEN 5 tablets (50 mg total) daily for 1 day, THEN 4 tablets (40 mg total) daily for 1 day, THEN 3 tablets (30 mg total) daily for 1 day, THEN 2 tablets (20 mg total) daily for 1 day, THEN 1 tablet (10 mg total) daily for 1 day. Take with food. 21 tablet 0   sertraline (ZOLOFT) 50 MG tablet Take 3 tablets (150 mg total) by mouth daily. (Patient not taking: Reported on 07/25/2023) 90 tablet 1    Labs  Lab Results:  Admission on 07/28/2023  Component Date Value Ref Range Status   POC Amphetamine UR 07/28/2023 None Detected  NONE DETECTED (Cut Off Level 1000 ng/mL) Final   POC Secobarbital (BAR) 07/28/2023 None Detected  NONE DETECTED (Cut Off Level 300 ng/mL) Final   POC Buprenorphine (BUP) 07/28/2023 None Detected  NONE DETECTED (Cut Off Level 10 ng/mL) Final   POC Oxazepam (BZO) 07/28/2023 None Detected  NONE DETECTED (Cut Off Level 300 ng/mL) Final   POC Cocaine UR 07/28/2023 None Detected  NONE DETECTED (Cut Off Level 300 ng/mL) Final   POC Methamphetamine UR 07/28/2023 None Detected  NONE DETECTED (Cut Off Level 1000 ng/mL) Final    POC Morphine 07/28/2023 None Detected  NONE DETECTED (Cut Off Level 300 ng/mL) Final   POC Methadone UR 07/28/2023 None Detected  NONE DETECTED (Cut Off Level 300 ng/mL) Final   POC Oxycodone UR 07/28/2023 None Detected  NONE DETECTED (Cut Off Level 100 ng/mL) Final   POC Marijuana UR 07/28/2023 Positive (A)  NONE DETECTED (Cut Off Level 50 ng/mL) Final  Admission on 07/27/2023, Discharged on 07/28/2023  Component Date Value Ref Range Status   WBC 07/27/2023 5.4  4.0 - 10.5 K/uL Final   RBC 07/27/2023 4.79  4.22 - 5.81 MIL/uL Final   Hemoglobin 07/27/2023 14.9  13.0 - 17.0 g/dL Final   HCT 43/32/9518 43.0  39.0 - 52.0 % Final   MCV 07/27/2023 89.8  80.0 - 100.0 fL Final   MCH 07/27/2023 31.1  26.0 - 34.0 pg Final   MCHC 07/27/2023 34.7  30.0 - 36.0 g/dL Final   RDW 84/16/6063 13.2  11.5 - 15.5 % Final   Platelets 07/27/2023 242  150 - 400 K/uL Final   nRBC 07/27/2023 0.0  0.0 - 0.2 % Final   Neutrophils Relative % 07/27/2023 40  % Final   Neutro Abs 07/27/2023 2.2  1.7 - 7.7 K/uL Final   Lymphocytes Relative  07/27/2023 52  % Final   Lymphs Abs 07/27/2023 2.8  0.7 - 4.0 K/uL Final   Monocytes Relative 07/27/2023 6  % Final   Monocytes Absolute 07/27/2023 0.4  0.1 - 1.0 K/uL Final   Eosinophils Relative 07/27/2023 1  % Final   Eosinophils Absolute 07/27/2023 0.1  0.0 - 0.5 K/uL Final   Basophils Relative 07/27/2023 1  % Final   Basophils Absolute 07/27/2023 0.0  0.0 - 0.1 K/uL Final   Immature Granulocytes 07/27/2023 0  % Final   Abs Immature Granulocytes 07/27/2023 0.01  0.00 - 0.07 K/uL Final   Performed at [facility], [address], [place], [place] [address]   Sodium 07/27/2023 139  135 - 145 mmol/L Final   Potassium 07/27/2023 3.8  3.5 - 5.1 mmol/L Final   Chloride 07/27/2023 101  98 - 111 mmol/L Final   CO2 07/27/2023 23  22 - 32 mmol/L Final   Glucose, Bld 07/27/2023 91  70 - 99 mg/dL Final   Glucose reference range applies only to samples taken after fasting for at  least 8 hours.   BUN 07/27/2023 13  6 - 20 mg/dL Final   Creatinine, Ser 07/27/2023 0.79  0.61 - 1.24 mg/dL Final   Calcium 60/45/4098 9.1  8.9 - 10.3 mg/dL Final   Total Protein 11/91/4782 6.2 (L)  6.5 - 8.1 g/dL Final   Albumin 95/62/1308 3.6  3.5 - 5.0 g/dL Final   AST 65/78/4696 44 (H)  15 - 41 U/L Final   ALT 07/27/2023 27  0 - 44 U/L Final   Alkaline Phosphatase 07/27/2023 63  38 - 126 U/L Final   Total Bilirubin 07/27/2023 0.5  0.0 - 1.2 mg/dL Final   GFR, Estimated 07/27/2023 >60  >60 mL/min Final   Comment: (NOTE) Calculated using the CKD-EPI Creatinine Equation (2021)    Anion gap 07/27/2023 15  5 - 15 Final   Performed at [facility], [address], [place], [place] [address]   TSH 07/27/2023 2.054  0.350 - 4.500 uIU/mL Final   Comment: Performed by a 3rd Generation assay with a functional sensitivity of <=0.01 uIU/mL. Performed at [facility], [address], [place], [place] [address]    Alcohol, Ethyl (B) 07/27/2023 154 (H)  <10 mg/dL Final   Comment: (NOTE) Lowest detectable limit for serum alcohol is 10 mg/dL.  For medical purposes only. Performed at [facility], [address] [place], [place] [address]    Magnesium 07/27/2023 2.1  1.7 - 2.4 mg/dL Final   Performed at [facility], [address] [place], [place] [address]   Cholesterol 07/27/2023 184  0 - 200 mg/dL Final   Triglycerides 64/40/3474 544 (H)  <150 mg/dL Final   HDL 25/95/6387 69  >40 mg/dL Final   Total CHOL/HDL Ratio 07/27/2023 2.7  RATIO Final   VLDL 07/27/2023 UNABLE TO CALCULATE IF TRIGLYCERIDE OVER 400 mg/dL  0 - 40 mg/dL Final   LDL Cholesterol 07/27/2023 UNABLE TO CALCULATE IF TRIGLYCERIDE OVER 400 mg/dL  0 - 99 mg/dL Final   Comment:        Total Cholesterol/HDL:CHD Risk Coronary Heart Disease Risk Table                     Men   Women  1/2 Average Risk   3.4   3.3  Average Risk       5.0   4.4  2 X  Average Risk   9.6   7.1  3 X Average Risk  23.4    11.0        Use the calculated Patient Ratio above and the CHD Risk Table to determine the patient's CHD Risk.        ATP III CLASSIFICATION (LDL):  <100     mg/dL   Optimal  119-147  mg/dL   Near or Above                    Optimal  130-159  mg/dL   Borderline  829-562  mg/dL   High  >130     mg/dL   Very High Performed at [facility], [address], [place], [place] [address]    Direct LDL 07/27/2023 65  0 - 99 mg/dL Final   Performed at [facility], [address] [place], [place] [address]  Office Visit on 07/25/2023  Component Date Value Ref Range Status   POC Glucose 07/25/2023 144 (A)  70 - 99 mg/dl Final   Hemoglobin X5M 07/25/2023 5.6  4.0 - 5.6 % Final    Blood Alcohol level:  Lab Results  Component Value Date   ETH 154 (H) 07/27/2023   ETH 211 (H) 05/03/2022    Metabolic Disorder Labs: Lab Results  Component Value Date   HGBA1C 5.6 07/25/2023   MPG 120 05/03/2022   MPG 105.41 12/01/2021   Lab Results  Component Value Date   PROLACTIN 4.0 05/03/2022   PROLACTIN 9.7 06/08/2021   Lab Results  Component Value Date   CHOL 184 07/27/2023   TRIG 544 (H) 07/27/2023   HDL 69 07/27/2023   CHOLHDL 2.7 07/27/2023   VLDL UNABLE TO CALCULATE IF TRIGLYCERIDE OVER 400 mg/dL 84/13/2440   LDLCALC UNABLE TO CALCULATE IF TRIGLYCERIDE OVER 400 mg/dL 03/10/2535   LDLCALC 75 05/03/2022    Therapeutic Lab Levels: No results found for: "LITHIUM" No results found for: "VALPROATE" No results found for: "CBMZ"  Physical Findings   GAD-7    Flowsheet Row Video Visit from 07/26/2023 in [facility] Office Visit from 07/25/2023 in [facility] [place] [facility] Of [place]. [facility] Video Visit from 06/13/2023 in [facility] Video Visit from 04/10/2023 in [facility] Video Visit from 02/06/2023 in [facility]  Total GAD-7  Score 16 17 13 2 1       PHQ2-9    Flowsheet Row ED from 07/28/2023 in [facility] Video Visit from 07/26/2023 in [facility] Office Visit from 07/25/2023 in [facility] [place] [facility] Dept Of [place]. [facility] Video Visit from 06/13/2023 in [facility] Video Visit from 04/10/2023 in [facility]  PHQ-2 Total Score 2 6 6 6  0  PHQ-9 Total Score 5 23 21 21  --      Flowsheet Row ED from 07/28/2023 in [facility] ED from 07/27/2023 in [facility] Video Visit from 07/26/2023 in [facility]  C-SSRS RISK CATEGORY No Risk No Risk Low Risk        Musculoskeletal  Strength & Muscle Tone: {desc; muscle tone:32375} Gait & Station: {PE GAIT ED UYQI:34742} Patient leans: {Patient Leans:21022755}  Psychiatric Specialty Exam  Presentation  General Appearance:  Casual  Eye Contact: Good  Speech: Clear and Coherent  Speech Volume: Normal  Handedness: Right   Mood and Affect  Mood: Euthymic  Affect: Congruent   Thought Process  Thought Processes: Coherent  Descriptions of Associations:Intact  Orientation:Full (Time, Place and Person)  Thought Content:WDL  Diagnosis of Schizophrenia or Schizoaffective disorder in past: No    Hallucinations:Hallucinations: None  Ideas of Reference:None  Suicidal Thoughts:Suicidal Thoughts: No  Homicidal Thoughts:Homicidal Thoughts: No   Sensorium  Memory: Immediate Fair; Recent Fair; Remote Fair  Judgment: Fair  Insight: Fair   Art therapist  Concentration: Fair  Attention Span: Fair  Recall: [employer] of Knowledge: Fair  Language: Fair   Psychomotor Activity  Psychomotor Activity: Psychomotor Activity: Normal   Assets  Assets: Communication Skills; Desire for Improvement; Housing;  Physical Health   Sleep  Sleep: Sleep: Fair Number of Hours of Sleep: 4   Nutritional Assessment (For OBS and FBC admissions only) Has the patient had a weight loss or gain of 10 pounds or more in the last 3 months?: No Has the patient had a decrease in food intake/or appetite?: No Does the patient have dental problems?: No Does the patient have eating habits or behaviors that may be indicators of an eating disorder including binging or inducing vomiting?: No Has the patient recently lost weight without trying?: 0 Has the patient been eating poorly because of a decreased appetite?: 0 Malnutrition Screening Tool Score: 0    Physical Exam  Physical Exam ROS Blood pressure (!) 114/94, pulse 91, temperature 97.8 F (36.6 C), temperature source Oral, resp. rate 20, SpO2 96%. There is no height or weight on file to calculate BMI.  Treatment Plan Summary: {CHL [facility] MD TX. WNUU:[id number]}  Lamar Sprinkles, MD 07/28/2023 8:43 AM

## 2023-07-28 NOTE — ED Notes (Signed)
 Pt is in the dayroom watching TV with peers. Pt reports feeling a bit anxious, but better today compared to yesterday. Pt denies SI/HI/AVH. Pt has no further complain.No acute distress noted. Will continue to monitor for safety and provide support.

## 2023-07-28 NOTE — ED Notes (Signed)
Patient observed resting quietly, eyes closed. Respirations equal and unlabored. Will continue to monitor for safety.  

## 2023-07-28 NOTE — ED Notes (Addendum)
 Pt presents to [facility] to get help with  Alcohol abuse. Pt reports he takes Marijuana occasionally. Admission process completed. Pt is oriented to the unit and provided with meal. Medication is administered. Pt denies SI/HI/AVH. Will continue to monitor for safety and provide support.

## 2023-07-28 NOTE — BH Assessment (Signed)
 Comprehensive Clinical Assessment (CCA) Note  07/28/2023 Cartez Mogle Ochoa [id number]  Disposition: Brendan Bering, NP recommends pt to be admitted to [facility] Based Crisis unit.   The patient demonstrates the following risk factors for suicide: Chronic risk factors for suicide include: psychiatric disorder of Major Depressive Disorder, moderate and substance use disorder. Acute risk factors for suicide include: N/A. Protective factors for this patient include:  None . Considering these factors, the overall suicide risk at this point appears to be no risk. Patient is not appropriate for outpatient follow up.  Brendan Ochoa is a 48 year old male who presents voluntary and accompanied to [facility] ([facility] Clinician asked the pt, "what brought you to the hospital?" Pt reports, he's been drinking everyday (beer, liquor) for the past three weeks. Pt reports, he's had a beer two hours before coming in. Pt reports, a lot of stuff triggers his alcohol use. Pt reports, he was sober for 6 months a month ago. Pt reports, smoking Marijuana here and there. Pt's BAL was 154 at 2340. Pt's UDS is positive for Marijuana. Pt denies, SI, HI, hallucinations, self-injurious behaviors and access to weapons.   Pt is linked to Brendan Back, PA at [facility] for medication management. Pt reports, he seen his provider a day or two ago.   Pt presents quiet, awake in casual attire, normal eye contact and speech. Pt's mood is euthymic. Pt's affect was congruent. Pt's insight was fair. Pt's judgement was poor.   Chief Complaint:  Chief Complaint  Patient presents with   Alcohol Problem   Visit Diagnosis: Major Depressive Disorder, moderate.                              Alcohol use Disorder, severe.    CCA Screening, Triage and Referral (STR)  Patient Reported Information How did you hear about [name]? Self  What Is the Reason for Your  Visit/Call Today? Pt reports, he's here for alcohol detox. Pt reports, he's had a beer two hours ago but no current withdrawal symptoms. Pt reports, he quit taking his psychiatric medications about 3 weeks ago. Pt also reports,  Marijuana use every now and then. Pt reports, symptoms of depression and anxiety. Pt denies, SI, HI, hallucinations, self-injurious behaviors and access to weapons.  How Long Has This Been Causing You Problems? > than 6 months  What Do You Feel Would Help You the Most Today? Alcohol or Drug Use Treatment; Medication(s); Treatment for Depression or other mood problem; Stress Management   Have You Recently Had Any Thoughts About Hurting Yourself? No  Are You Planning to Commit Suicide/Harm Yourself At This time? No   Flowsheet Row ED from 07/28/2023 in [facility] ED from 07/27/2023 in [facility] Video Visit from 07/26/2023 in [facility]  C-SSRS RISK CATEGORY No Risk No Risk Low Risk       Have you Recently Had Thoughts About Hurting Someone Brendan Ochoa? No  Are You Planning to Harm Someone at This Time? No  Explanation: None.   Have You Used Any Alcohol or Drugs in the Past 24 Hours? Yes  How Long Ago Did You Use Drugs or Alcohol? 07/27/2023. What Did You Use and How Much? Pt reports, he drank a beer about two hours ago.   Do You Currently Have a Therapist/Psychiatrist? Yes  Name of Therapist/Psychiatrist:  Name of Therapist/Psychiatrist: Pt is linked to Brendan Back, PA at [facility]) for medication management.   Have You Been Recently Discharged From Any Office Practice or Programs? No  Explanation of Discharge From Practice/Program: None.     CCA Screening Triage Referral Assessment Type of Contact: Face-to-Face  Telemedicine Service Delivery:   Is this Initial or Reassessment?   Date Telepsych consult ordered in [facility]:     Time Telepsych consult ordered in [facility]:    Location of Assessment: [facility] Assessment Services  Provider Location: [facility] Assessment Services   Collateral Involvement: None.   Does Patient Have a Automotive engineer Guardian? No  Legal Guardian Contact Information: Pt is his own guardian.  Copy of Legal Guardianship Form: -- (Pt is his own guardian.)  Legal Guardian Notified of Arrival: -- (Pt is his own guardian.)  Legal Guardian Notified of Pending Discharge: -- (Pt is his own guardian.)  If Minor and Not Living with Parent(s), Who has Custody? Pt is an adult.  Is CPS involved or ever been involved? Never  Is APS involved or ever been involved? Never   Patient Determined To Be At Risk for Harm To Self or Others Based on Review of Patient Reported Information or Presenting Complaint? No  Method: No Plan  Availability of Means: No access or NA  Intent: Vague intent or NA  Notification Required: No need or identified person  Additional Information for Danger to Others Potential: -- (None.)  Additional Comments for Danger to Others Potential: None.  Are There Guns or Other Weapons in Your Home? No  Types of Guns/Weapons: Pt smirked then said "no."  Are These Weapons Safely Secured?                            -- (NA)  Who Could Verify You Are Able To Have These Secured: NA  Do You Have any Outstanding Charges, Pending Court Dates, Parole/Probation? Pt denies.  Contacted To Inform of Risk of Harm To Self or Others: Other: Comment    Does Patient Present under Involuntary Commitment? No    [place] of Residence: [name]   Patient Currently Receiving the Following Services: Medication Management   Determination of Need: Urgent (48 hours)   Options For Referral: Facility-Based Crisis; [facility]; Medication Management; Outpatient Therapy; Inpatient Hospitalization     CCA Biopsychosocial Patient Reported Schizophrenia/Schizoaffective Diagnosis in  Past: No   Strengths: Pt is seeking hwelp with alcoholism.   Mental Health Symptoms Depression:  Sleep (too much or little); Worthlessness; Hopelessness; Tearfulness; Difficulty Concentrating; Increase/decrease in appetite   Duration of Depressive symptoms: Duration of Depressive Symptoms: Greater than two weeks   Mania:  None   Anxiety:   Difficulty concentrating; Worrying   Psychosis:  None   Duration of Psychotic symptoms:    Trauma:  None   Obsessions:  None   Compulsions:  None   Inattention:  Forgetful; Loses things   Hyperactivity/Impulsivity:  None   Oppositional/Defiant Behaviors:  None   Emotional Irregularity:  None   Other Mood/Personality Symptoms:  None.    Mental Status Exam Appearance and self-care  Stature:  Average   Weight:  Average weight   Clothing:  Casual   Grooming:  Normal   Cosmetic use:  None   Posture/gait:  Normal   Motor activity:  Not Remarkable   Sensorium  Attention:  Normal   Concentration:  Normal  Orientation:  X5   Recall/memory:  Normal   Affect and Mood  Affect:  Congruent.   Mood:  Euthymic.  Relating  Eye contact:  Normal   Facial expression:  Responsive   Attitude toward examiner:  Cooperative   Thought and Language  Speech flow: Normal   Thought content:  Appropriate to Mood and Circumstances   Preoccupation:  None   Hallucinations:  None   Organization:  Coherent   [employer] of Knowledge:  Fair   Intelligence:  Normal.  Abstraction:  Functional   Judgement:  Poor   Reality Testing:  Adequate   Insight:  Fair   Decision Making:  Impulsive   Social Functioning  Social Maturity:  Impulsive   Social Judgement:  Heedless   Stress  Stressors:  Other (Comment) (Pt reports, he worried about his life.)   Coping Ability:  Overwhelmed   Skill Deficits:  Decision making   Supports:  Family     Religion: Religion/Spirituality Are You A Religious Person?:  Yes What is Your Religious Affiliation?: Catholic How Might This Affect Treatment?: None.   Leisure/Recreation: Leisure / Recreation Do You Have Hobbies?: Yes Leisure and Hobbies: Naval architect, fishing.   Exercise/Diet: Exercise/Diet Do You Exercise?: No Have You Gained or Lost A Significant Amount of Weight in the Past Six Months?: No Do You Follow a Special Diet?: No Do You Have Any Trouble Sleeping?: Yes Explanation of Sleeping Difficulties: Pt reports, he doesn't really sleep.     CCA Employment/Education Employment/Work Situation: Employment / Work Situation Employment Situation: Unemployed Patient's Job has Been Impacted by Current Illness: No Has Patient ever Been in Equities trader?: No   Education: Education Is Patient Currently Attending School?:  (GED) Last Grade Completed: 12 Did You Attend College?: Yes What Type of College Degree Do you Have?: Pt attending [employer] studying Carpentry. Did You Have An Individualized Education Program (IIEP): No Did You Have Any Difficulty At School?: No Patient's Education Has Been Impacted by Current Illness: No     CCA Family/Childhood History Family and Relationship History: Family history Marital status: Single Does patient have children?: No   Childhood History:  Childhood History By whom was/is the patient raised?: Both parents Did patient suffer any verbal/emotional/physical/sexual abuse as a child?: No Did patient suffer from severe childhood neglect?: No Has patient ever been sexually abused/assaulted/raped as an adolescent or adult?: No Was the patient ever a victim of a crime or a disaster?: No Witnessed domestic violence?: No Has patient been affected by domestic violence as an adult?: No  CCA Substance Use Alcohol/Drug Use: Alcohol / Drug Use Pain Medications: See MAR Prescriptions: See MAR Over the Counter: See MAR History of alcohol / drug use?: Yes Longest period of sobriety (when/how long): Per chart,  2 years sober approximately 10 years ago. Pt reports, most recently 6 months. Negative Consequences of Use: Personal relationships Withdrawal Symptoms: None Substance #1 Name of Substance 1: Alcohol. 1 - Age of First Use: 22. 1 - Amount (size/oz): Pt reports, he drank a beer about two hours ago. 1 - Frequency: Everyday. 1 - Duration: Ongoing. 1 - Last Use / Amount: Today (07/27/2023). 1 - Method of Aquiring: Pt reports, he gets money a lot of ways but declined to share. 1- Route of Use: Oral.   ASAM's:  Six Dimensions of Multidimensional Assessment   Dimension 1:  Acute Intoxication and/or Withdrawal Potential:   Dimension 1:  Description of individual's past and current experiences of substance  use and withdrawal: None.  Dimension 2:  Biomedical Conditions and Complications:   Dimension 2:  Description of patient's biomedical conditions and  complications: None.  Dimension 3:  Emotional, Behavioral, or Cognitive Conditions and Complications:  Dimension 3:  Description of emotional, behavioral, or cognitive conditions and complications: Pt has a history of alcoholism, symptoms of depression and anxiety. Pt has previous diagnoses of: Moderate episode of recurrent major depressive disorder (HCC), GAD, Alcohol use disorder, severe, dependence (HCC), Cocaine use disorder, severe, dependence (HCC), Alcohol abuse with withdrawal (HCC).  Dimension 4:  Readiness to Change:  Dimension 4:  Description of Readiness to Change criteria: Pt is seeking detox from alcohol.  Dimension 5:  Relapse, Continued use, or Continued Problem Potential:  Dimension 5:  Relapse, continued use, or continued problem potential critiera description: Pt has ongoing use.  Dimension 6:  Recovery/Living Environment:  Dimension 6:  Recovery/Iiving environment criteria description: Pt reports, he is homless and bounces from place to place.  ASAM Severity Score: ASAM's Severity Rating Score: 7  ASAM Recommended Level of Treatment:  ASAM Recommended Level of Treatment: Level I Outpatient Treatment    Substance use Disorder (SUD) Substance Use Disorder (SUD)  Checklist Symptoms of Substance Use: Continued use despite having a persistent/recurrent physical/psychological problem caused/exacerbated by use, Continued use despite persistent or recurrent social, interpersonal problems, caused or exacerbated by use   Recommendations for Services/Supports/Treatments: Recommendations for Services/Supports/Treatments Recommendations For Services/Supports/Treatments: Facility Based Crisis    Disposition Recommendation per psychiatric provider: Pt to be admitted to [facility] Based Crisis unit.    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     Referrals to Alternative Service(s): Referred  to Alternative Service(s):   Place:   Date:   Time:    Referred to Alternative Service(s):   Place:   Date:   Time:    Referred to Alternative Service(s):   Place:   Date:   Time:    Referred to Alternative Service(s):   Place:   Date:   Time:     Redmond Pulling, LCMHCComprehensive Clinical Assessment (CCA) Screening, Triage and Referral Note  07/28/2023 Brendan Ochoa [id number]  Chief Complaint:  Chief Complaint  Patient presents with   Alcohol Problem   Visit Diagnosis:   Patient Reported Information How did you hear about [name]? Self  What Is the Reason for Your  Visit/Call Today? Pt reports, he's here for alcohol detox. Pt reports, he's had a beer two hours ago but no current withdrawal symptoms. Pt reports, he quit taking his psychiatric medications about 3 weeks ago. Pt also reports,  Marijuana use every now and then. Pt reports, symptoms of depression and anxiety. Pt denies, SI, HI, hallucinations, self-injurious behaviors and access to weapons.  How Long Has This Been Causing You Problems? > than 6 months  What Do You Feel Would Help You the Most Today? Alcohol or Drug Use Treatment; Medication(s); Treatment for Depression or other mood problem; Stress Management   Have You Recently Had Any Thoughts About Hurting Yourself? No  Are You Planning to Commit Suicide/Harm Yourself At This time? No   Have you Recently Had Thoughts About Hurting Someone Brendan Ochoa? No  Are You Planning to Harm Someone at This Time? No  Explanation: None.   Have You Used Any Alcohol or Drugs in the Past 24 Hours? Yes  How Long Ago Did You Use Drugs or Alcohol? 07/27/2023. What Did You Use and How Much? Pt reports, he drank a beer about two hours ago.   Do You Currently Have a Therapist/Psychiatrist? Yes  Name of Therapist/Psychiatrist: Pt is linked to Brendan Back, PA at [facility]) for medication management.   Have You Been Recently Discharged  From Any Office Practice or Programs? No  Explanation of Discharge From Practice/Program: None.    CCA Screening Triage Referral Assessment Type of Contact: Face-to-Face  Telemedicine Service Delivery:   Is this Initial or Reassessment?   Date Telepsych consult ordered in [facility]:    Time Telepsych consult ordered in [facility]:    Location of Assessment: [facility] Assessment Services  Provider Location: [facility] Assessment Services    Collateral Involvement: None.   Does Patient Have a Automotive engineer Guardian? No. Name and Contact of Legal Guardian: Pt is his own guardian.  If Minor and Not Living with Parent(s), Who has Custody? Pt is an adult.  Is CPS involved or ever been involved? Never  Is APS involved or ever been involved? Never   Patient Determined To Be At Risk for Harm To Self or Others Based on Review of Patient Reported Information or Presenting Complaint? No  Method: No Plan  Availability of Means: No access or NA  Intent: Vague intent or NA  Notification Required: No need or identified person  Additional Information for Danger to Others Potential: -- (None.)  Additional Comments for Danger to Others Potential: None.  Are There Guns or Other Weapons in Your Home? No  Types of Guns/Weapons: Pt smirked then said "no."  Are These Weapons Safely Secured?                            -- (NA)  Who Could Verify You Are Able To Have These Secured: NA  Do You Have any Outstanding Charges, Pending Court Dates, Parole/Probation? Pt denies.  Contacted To Inform of Risk of Harm To Self or Others: Other: Comment   Does Patient Present under Involuntary Commitment? No    [place] of Residence: [name]   Patient Currently Receiving the Following Services: Medication Management   Determination of Need: Urgent (48 hours)   Options For Referral: Facility-Based Crisis; [facility]; Medication Management; Outpatient Therapy; Inpatient  Hospitalization   Disposition Recommendation per psychiatric provider: Pt to be admitted to [facility] Based Crisis unit.   Caprice Renshaw  Kathlee Nations, [facility]   Redmond Pulling, MS, [facility], [facility] Triage Specialist [phone]

## 2023-07-28 NOTE — ED Provider Notes (Signed)
 Facility Based Crisis Admission H&P  Date: 07/28/23 Patient Name: Brendan Ochoa MRN: [id number] Chief Complaint: alcohol detox  Diagnoses:  Final diagnoses:  Alcohol use disorder    HPI: Brendan Ochoa is a 48 y/o single male with a history of alcohol abuse, generalized anxiety disorder, major depressive disorder and sleep disturbances presented to [facility] unaccompanied requesting alcohol detox.  Brendan Ochoa, 48 y.o., male patient seen face to face by this provider, and chart reviewed on 07/28/23. On evaluation Brendan Ochoa reports that he has been drinking about 10 beers a day and and smoking marijuana about once a week.  Patient reports that he is not taking his medication Zoloft 50 or Abilify 5 for  the past 3 weeks.  Patient's UDS is positive for marijuana, Reports that he is being followed by [name]- PA and [facility] outpatient clinic. Patient denies any SI/HI or AVH at this time.  During evaluation Brendan Ochoa is sitting calmly in the assessment room in no acute distress.  She is alert, oriented x 4, calm, cooperative and attentive.  His mood is euthymic with congruent affect.  He has normal speech, and behavior.  Objectively there is no evidence of psychosis/mania or delusional thinking.  Patient is able to converse coherently, goal directed thoughts, no distractibility, or pre-occupation.  He also denies suicidal/self-harm/homicidal ideation, psychosis, and paranoia.  Patient answered question appropriately.     Patient will be admitted to [name] for crisis management, stabilization to be able to safely detox from alcohol.  PHQ 2-9:  Flowsheet Row ED from 07/28/2023 in [facility] Video Visit from 07/26/2023 in [facility] Office Visit from 07/25/2023 in [facility]  Thoughts that you would be better off dead, or of hurting  yourself in some way Not at all Not at all Not at all  PHQ-9 Total Score 5 23 21        Flowsheet Row ED from 07/28/2023 in [facility] ED from 07/27/2023 in [facility] Video Visit from 07/26/2023 in [facility]  C-SSRS RISK CATEGORY No Risk No Risk Low Risk         Total Time spent with patient: 20 minutes  Musculoskeletal  Strength & Muscle Tone: within normal limits Gait & Station: normal Patient leans: N/A  Psychiatric Specialty Exam  Presentation General Appearance:  Casual  Eye Contact: Good  Speech: Clear and Coherent  Speech Volume: Normal  Handedness: Right   Mood and Affect  Mood: Euthymic  Affect: Congruent   Thought Process  Thought Processes: Coherent  Descriptions of Associations:Intact  Orientation:Full (Time, Place and Person)  Thought Content:WDL  Diagnosis of Schizophrenia or Schizoaffective disorder in past: No   Hallucinations:Hallucinations: None  Ideas of Reference:None  Suicidal Thoughts:Suicidal Thoughts: No  Homicidal Thoughts:Homicidal Thoughts: No   Sensorium  Memory: Immediate Fair; Recent Fair; Remote Fair  Judgment: Fair  Insight: Fair   Art therapist  Concentration: Fair  Attention Span: Fair  Recall: [employer] of Knowledge: Fair  Language: Fair   Psychomotor Activity  Psychomotor Activity: Psychomotor Activity: Normal   Assets  Assets: Communication Skills; Desire for Improvement; Housing; Physical Health   Sleep  Sleep: Sleep: Fair Number of Hours of Sleep: 4   Nutritional Assessment (For OBS and FBC admissions only) Has the patient had a weight loss  or gain of 10 pounds or more in the last 3 months?: No Has the patient had a decrease in food intake/or appetite?: No Does the patient have dental problems?: No Does the patient have eating habits or behaviors that may be indicators of  an eating disorder including binging or inducing vomiting?: No Has the patient recently lost weight without trying?: 0 Has the patient been eating poorly because of a decreased appetite?: 0 Malnutrition Screening Tool Score: 0    Physical Exam HENT:     Head: Normocephalic.     Nose: Nose normal.  Eyes:     Pupils: Pupils are equal, round, and reactive to light.  Cardiovascular:     Rate and Rhythm: Normal rate.  Pulmonary:     Effort: Pulmonary effort is normal.  Abdominal:     General: Abdomen is flat.  Musculoskeletal:        General: Normal range of motion.     Cervical back: Normal range of motion.  Skin:    General: Skin is warm.  Neurological:     Mental Status: He is alert and oriented to person, place, and time.  Psychiatric:        Attention and Perception: Attention normal.        Mood and Affect: Mood is anxious.        Speech: Speech normal.        Behavior: Behavior is cooperative.        Thought Content: Thought content is not paranoid or delusional. Thought content does not include homicidal or suicidal ideation. Thought content does not include homicidal or suicidal plan.        Cognition and Memory: Cognition normal.        Judgment: Judgment is impulsive.    Review of Systems  Constitutional: Negative.   HENT: Negative.    Eyes: Negative.   Respiratory: Negative.    Cardiovascular: Negative.   Gastrointestinal: Negative.   Genitourinary: Negative.   Musculoskeletal: Negative.   Skin: Negative.   Neurological: Negative.   Endo/Heme/Allergies: Negative.   Psychiatric/Behavioral:  Positive for substance abuse. The patient is nervous/anxious.     Blood pressure 109/79, pulse 86, temperature 98.4 F (36.9 C), temperature source Oral, resp. rate 20, SpO2 97%. There is no height or weight on file to calculate BMI.  Past Psychiatric History: Cocaine use disorder, alcohol abuse  Is the patient at risk to self? No  Has the patient been a risk to self  in the past 6 months? No .    Has the patient been a risk to self within the distant past? No   Is the patient a risk to others? No   Has the patient been a risk to others in the past 6 months? No   Has the patient been a risk to others within the distant past? No   Past Medical History:  Past Medical History:  Diagnosis Date   Asthma    Cocaine abuse (HCC)    in past   Crohn disease (HCC) 2008   Depression    in past, not current as of 2019    ETOH abuse    history of alcohol use   GERD (gastroesophageal reflux disease)     Family History:  Family History  Problem Relation Age of Onset   Stomach cancer Maternal Grandfather    Colon cancer Neg Hx     Social History:  Social History   Socioeconomic History   Marital status: Single  Spouse name: Not on file   Number of children: Not on file   Years of education: Not on file   Highest education level: Not on file  Occupational History   Not on file  Tobacco Use   Smoking status: Every Day    Current packs/day: 0.50    Average packs/day: 0.5 packs/day for 15.0 years (7.5 ttl pk-yrs)    Types: Cigarettes   Smokeless tobacco: Never   Tobacco comments:    1/4 to 1/2 pack per day   Vaping Use   Vaping status: Never Used  Substance and Sexual Activity   Alcohol use: Not Currently    Comment: alcoholic- last use 11/10/20   Drug use: Not Currently    Types: Cocaine, Marijuana, Methamphetamines    Comment: last cocaine use 10/10/20.  last meth april 2022   Sexual activity: Not on file  Other Topics Concern   Not on file  Social History Narrative   Not on file   Social Drivers of Health   Financial Resource Strain: Not on file  Food Insecurity: Food Insecurity Present (07/28/2023)   Hunger Vital Sign    Worried About Running Out of Food in the Last Year: Sometimes true    Ran Out of Food in the Last Year: Sometimes true  Transportation Needs: No Transportation Needs (07/28/2023)   PRAPARE - Doctor, general practice (Medical): No    Lack of Transportation (Non-Medical): No  Physical Activity: Not on file  Stress: Not on file  Social Connections: Not on file  Intimate Partner Violence: Not At Risk (07/28/2023)   Humiliation, Afraid, Rape, and Kick questionnaire    Fear of Current or Ex-Partner: No    Emotionally Abused: No    Physically Abused: No    Sexually Abused: No     Last Labs:  Admission on 07/28/2023  Component Date Value Ref Range Status   POC Amphetamine UR 07/28/2023 None Detected  NONE DETECTED (Cut Off Level 1000 ng/mL) Final   POC Secobarbital (BAR) 07/28/2023 None Detected  NONE DETECTED (Cut Off Level 300 ng/mL) Final   POC Buprenorphine (BUP) 07/28/2023 None Detected  NONE DETECTED (Cut Off Level 10 ng/mL) Final   POC Oxazepam (BZO) 07/28/2023 None Detected  NONE DETECTED (Cut Off Level 300 ng/mL) Final   POC Cocaine UR 07/28/2023 None Detected  NONE DETECTED (Cut Off Level 300 ng/mL) Final   POC Methamphetamine UR 07/28/2023 None Detected  NONE DETECTED (Cut Off Level 1000 ng/mL) Final   POC Morphine 07/28/2023 None Detected  NONE DETECTED (Cut Off Level 300 ng/mL) Final   POC Methadone UR 07/28/2023 None Detected  NONE DETECTED (Cut Off Level 300 ng/mL) Final   POC Oxycodone UR 07/28/2023 None Detected  NONE DETECTED (Cut Off Level 100 ng/mL) Final   POC Marijuana UR 07/28/2023 Positive (A)  NONE DETECTED (Cut Off Level 50 ng/mL) Final  Admission on 07/27/2023, Discharged on 07/28/2023  Component Date Value Ref Range Status   WBC 07/27/2023 5.4  4.0 - 10.5 K/uL Final   RBC 07/27/2023 4.79  4.22 - 5.81 MIL/uL Final   Hemoglobin 07/27/2023 14.9  13.0 - 17.0 g/dL Final   HCT 24/40/1027 43.0  39.0 - 52.0 % Final   MCV 07/27/2023 89.8  80.0 - 100.0 fL Final   MCH 07/27/2023 31.1  26.0 - 34.0 pg Final   MCHC 07/27/2023 34.7  30.0 - 36.0 g/dL Final   RDW 25/36/6440 13.2  11.5 - 15.5 % Final  Platelets 07/27/2023 242  150 - 400 K/uL Final   nRBC 07/27/2023 0.0   0.0 - 0.2 % Final   Neutrophils Relative % 07/27/2023 40  % Final   Neutro Abs 07/27/2023 2.2  1.7 - 7.7 K/uL Final   Lymphocytes Relative 07/27/2023 52  % Final   Lymphs Abs 07/27/2023 2.8  0.7 - 4.0 K/uL Final   Monocytes Relative 07/27/2023 6  % Final   Monocytes Absolute 07/27/2023 0.4  0.1 - 1.0 K/uL Final   Eosinophils Relative 07/27/2023 1  % Final   Eosinophils Absolute 07/27/2023 0.1  0.0 - 0.5 K/uL Final   Basophils Relative 07/27/2023 1  % Final   Basophils Absolute 07/27/2023 0.0  0.0 - 0.1 K/uL Final   Immature Granulocytes 07/27/2023 0  % Final   Abs Immature Granulocytes 07/27/2023 0.01  0.00 - 0.07 K/uL Final   Performed at [facility], [address], [place], [place] [address]   Sodium 07/27/2023 139  135 - 145 mmol/L Final   Potassium 07/27/2023 3.8  3.5 - 5.1 mmol/L Final   Chloride 07/27/2023 101  98 - 111 mmol/L Final   CO2 07/27/2023 23  22 - 32 mmol/L Final   Glucose, Bld 07/27/2023 91  70 - 99 mg/dL Final   Glucose reference range applies only to samples taken after fasting for at least 8 hours.   BUN 07/27/2023 13  6 - 20 mg/dL Final   Creatinine, Ser 07/27/2023 0.79  0.61 - 1.24 mg/dL Final   Calcium 30/86/5784 9.1  8.9 - 10.3 mg/dL Final   Total Protein 69/62/9528 6.2 (L)  6.5 - 8.1 g/dL Final   Albumin 41/32/4401 3.6  3.5 - 5.0 g/dL Final   AST 02/72/5366 44 (H)  15 - 41 U/L Final   ALT 07/27/2023 27  0 - 44 U/L Final   Alkaline Phosphatase 07/27/2023 63  38 - 126 U/L Final   Total Bilirubin 07/27/2023 0.5  0.0 - 1.2 mg/dL Final   GFR, Estimated 07/27/2023 >60  >60 mL/min Final   Comment: (NOTE) Calculated using the CKD-EPI Creatinine Equation (2021)    Anion gap 07/27/2023 15  5 - 15 Final   Performed at [facility], [address], [place], [place] [address]   TSH 07/27/2023 2.054  0.350 - 4.500 uIU/mL Final   Comment: Performed by a 3rd Generation assay with a functional sensitivity of <=0.01 uIU/mL. Performed at [facility]  Lab, [address], [place], [place] [address]    Alcohol, Ethyl (B) 07/27/2023 154 (H)  <10 mg/dL Final   Comment: (NOTE) Lowest detectable limit for serum alcohol is 10 mg/dL.  For medical purposes only. Performed at [facility], [address], [place], [place] [address]    Magnesium 07/27/2023 2.1  1.7 - 2.4 mg/dL Final   Performed at [facility], [address] [place], [place] [address]   Cholesterol 07/27/2023 184  0 - 200 mg/dL Final   Triglycerides 29/51/8841 544 (H)  <150 mg/dL Final   HDL 66/10/3014 69  >40 mg/dL Final   Total CHOL/HDL Ratio 07/27/2023 2.7  RATIO Final   VLDL 07/27/2023 UNABLE TO CALCULATE IF TRIGLYCERIDE OVER 400 mg/dL  0 - 40 mg/dL Final   LDL Cholesterol 07/27/2023 UNABLE TO CALCULATE IF TRIGLYCERIDE OVER 400 mg/dL  0 - 99 mg/dL Final   Comment:        Total Cholesterol/HDL:CHD Risk Coronary Heart Disease Risk Table  Men   Women  1/2 Average Risk   3.4   3.3  Average Risk       5.0   4.4  2 X Average Risk   9.6   7.1  3 X Average Risk  23.4   11.0        Use the calculated Patient Ratio above and the CHD Risk Table to determine the patient's CHD Risk.        ATP III CLASSIFICATION (LDL):  <100     mg/dL   Optimal  132-440  mg/dL   Near or Above                    Optimal  130-159  mg/dL   Borderline  102-725  mg/dL   High  >366     mg/dL   Very High Performed at [facility], [address], [place], [place] [address]    Direct LDL 07/27/2023 65  0 - 99 mg/dL Final   Performed at [facility], [address] [place], [place] [address]  Office Visit on 07/25/2023  Component Date Value Ref Range Status   POC Glucose 07/25/2023 144 (A)  70 - 99 mg/dl Final   Hemoglobin D6L 07/25/2023 5.6  4.0 - 5.6 % Final    Allergies: Patient has no known allergies.  Medications:  Facility Ordered Medications  Medication   acetaminophen (TYLENOL) tablet 650 mg   alum & mag hydroxide-simeth (MAALOX/MYLANTA)  200-200-20 MG/5ML suspension 30 mL   magnesium hydroxide (MILK OF MAGNESIA) suspension 30 mL   haloperidol (HALDOL) tablet 5 mg   And   diphenhydrAMINE (BENADRYL) capsule 50 mg   haloperidol lactate (HALDOL) injection 5 mg   And   diphenhydrAMINE (BENADRYL) injection 50 mg   And   LORazepam (ATIVAN) injection 2 mg   haloperidol lactate (HALDOL) injection 10 mg   And   diphenhydrAMINE (BENADRYL) injection 50 mg   And   LORazepam (ATIVAN) injection 2 mg   traZODone (DESYREL) tablet 50 mg   [START ON 07/31/2023] hydrOXYzine (ATARAX) tablet 25 mg   [COMPLETED] thiamine (VITAMIN B1) injection 100 mg   [START ON 07/29/2023] thiamine (VITAMIN B1) tablet 100 mg   multivitamin with minerals tablet 1 tablet   LORazepam (ATIVAN) tablet 1 mg   hydrOXYzine (ATARAX) tablet 25 mg   loperamide (IMODIUM) capsule 2-4 mg   ondansetron (ZOFRAN-ODT) disintegrating tablet 4 mg   LORazepam (ATIVAN) tablet 1 mg   Followed by   Melene Muller ON 07/29/2023] LORazepam (ATIVAN) tablet 1 mg   Followed by   Melene Muller ON 07/30/2023] LORazepam (ATIVAN) tablet 1 mg   Followed by   Melene Muller ON 08/01/2023] LORazepam (ATIVAN) tablet 1 mg   PTA Medications  Medication Sig   albuterol (VENTOLIN HFA) 108 (90 Base) MCG/ACT inhaler Inhale 2 puffs into the lungs every 6 (six) hours as needed for wheezing or shortness of breath. (Patient not taking: Reported on 07/25/2023)   Multiple Vitamin (MULTIVITAMIN WITH MINERALS) TABS tablet Take 1 tablet by mouth daily. (Patient not taking: Reported on 07/25/2023)   loratadine (CLARITIN) 10 MG tablet Take 1 tablet (10 mg total) by mouth daily. (Patient not taking: Reported on 07/25/2023)   ARIPiprazole (ABILIFY) 5 MG tablet Take 1 tablet (5 mg total) by mouth daily. (Patient not taking: Reported on 07/25/2023)   sertraline (ZOLOFT) 50 MG tablet Take 3 tablets (150 mg total) by mouth daily. (Patient not taking: Reported on 07/25/2023)   amoxicillin (AMOXIL)  500 MG capsule Take 2 capsules (1,000 mg  total) by mouth 2 (two) times daily for 10 days.   fluticasone (FLONASE) 50 MCG/ACT nasal spray Place 1 spray into both nostrils daily.   predniSONE (DELTASONE) 10 MG tablet Take 6 tablets (60 mg total) by mouth daily for 1 day, THEN 5 tablets (50 mg total) daily for 1 day, THEN 4 tablets (40 mg total) daily for 1 day, THEN 3 tablets (30 mg total) daily for 1 day, THEN 2 tablets (20 mg total) daily for 1 day, THEN 1 tablet (10 mg total) daily for 1 day. Take with food.    Long Term Goals: Improvement in symptoms so as ready for discharge  Short Term Goals: Patient will verbalize feelings in meetings with treatment team members., Patient will attend at least of 50% of the groups daily., and Pt will complete the PHQ9 on admission, day 3 and discharge.  Medical Decision Making  Rider Ermis is a 48 y/o male with a history of alcohol abuse, generalized anxiety disorder, major depressive disorder and sleep disturbances presented to [facility] unaccompanied requesting alcohol detox.    Recommendations  Based on my evaluation patient does not appear to have a medical emergency situation.  Patient will be admitted to [facility] for crisis management, stabilization to be able to safely detox from alcohol.  Jasper Riling, NP 07/28/23  6:12 AM

## 2023-07-28 NOTE — ED Notes (Signed)
 Patient A&Ox4. He is pleasant, calm, and cooperative. Denies SI/HI or A/VH. Physical complaints of akathisia, nausea, and anxiety. No acute distress observed. Routine safety checks conducted according to facility protocol. Patient agreed to notify staff if thoughts of harm toward self or others arise. Will continue to monitor for safety.

## 2023-07-29 ENCOUNTER — Ambulatory Visit: Payer: Self-pay | Admitting: Licensed Clinical Social Worker

## 2023-07-29 ENCOUNTER — Other Ambulatory Visit: Payer: Self-pay

## 2023-07-29 DIAGNOSIS — F321 Major depressive disorder, single episode, moderate: Secondary | ICD-10-CM | POA: Diagnosis not present

## 2023-07-29 DIAGNOSIS — F1094 Alcohol use, unspecified with alcohol-induced mood disorder: Secondary | ICD-10-CM | POA: Diagnosis not present

## 2023-07-29 MED ORDER — NALTREXONE HCL 50 MG PO TABS
50.0000 mg | ORAL_TABLET | Freq: Every day | ORAL | 0 refills | Status: DC
  Filled 2023-07-29: qty 30, 30d supply, fill #0

## 2023-07-29 MED ORDER — NICOTINE 14 MG/24HR TD PT24
14.0000 mg | MEDICATED_PATCH | Freq: Every day | TRANSDERMAL | Status: DC
  Administered 2023-07-29: 14 mg via TRANSDERMAL
  Filled 2023-07-29: qty 1

## 2023-07-29 MED ORDER — ADULT MULTIVITAMIN W/MINERALS CH: 1.0000 | ORAL_TABLET | Freq: Every day | ORAL | Status: DC

## 2023-07-29 MED ORDER — SERTRALINE HCL 100 MG PO TABS
100.0000 mg | ORAL_TABLET | Freq: Every day | ORAL | Status: DC
Start: 2023-07-29 — End: 2023-07-29
  Administered 2023-07-29: 100 mg via ORAL
  Filled 2023-07-29: qty 1

## 2023-07-29 MED ORDER — NALTREXONE HCL 50 MG PO TABS
50.0000 mg | ORAL_TABLET | Freq: Every day | ORAL | Status: DC
  Administered 2023-07-29: 50 mg via ORAL
  Filled 2023-07-29: qty 1

## 2023-07-29 MED ORDER — NICOTINE 14 MG/24HR TD PT24: 14.0000 mg | MEDICATED_PATCH | Freq: Every day | TRANSDERMAL | Status: DC

## 2023-07-29 MED ORDER — ARIPIPRAZOLE 5 MG PO TABS
5.0000 mg | ORAL_TABLET | Freq: Every day | ORAL | Status: DC
  Administered 2023-07-29: 5 mg via ORAL
  Filled 2023-07-29: qty 1

## 2023-07-29 NOTE — Tx Team (Signed)
 LCSW went and spoke with patient at bedside to assess current needs and reason for admission.  Patient reports he admitted into the [facility] in order to detox from alcohol.  Patient reports a recent return to use a few weeks ago.  Patient reports he does not identify himself as having a problem at this time, as he was able to hit the issue ahead on before spiraling out of control.  Patient reports he has been residing at [facility] for the last 3 weeks.  Patient reports his plan is to return back to the [place] house once he has detoxed from his alcohol use.  Patient reports occasional marijuana use, however reports he has not used in over a month.  Patient reports he is currently not working, denies any legal charges, and reports having access to his own transportation.  Patient reports he will continue to follow up with outpatient services provided at [facility] upstairs for medication management with Eddie. Patient reports an interest in therapy once discharged. Patient reports he will also continue to follow up with AA meetings within the local area. Patient has provided LCSW with permission to contact [facility] regarding readmission. Patient aware the LCSW will provide update once received. No other concerns were reported by the patient at this time.    LCSW contacted House Coordinator John at [phone] to inquire about patient's return. Per Jonny Ruiz, patient is not able to return until he has been gone from the house for 14 days. Per Jonny Ruiz, he believes the admit date is around 3/23-3/24. John reports patient is aware of what date is able to return. No safety concerns reported. Update to be provided to patient.   Fernande Boyden, LCSW Clinical Social Worker [facility] Ph: [phone]

## 2023-07-29 NOTE — ED Notes (Signed)
 Patient is sleeping. Respirations equal and unlabored, skin warm and dry. No change in assessment or acuity. Routine safety checks conducted according to facility protocol. Will continue to monitor for safety.

## 2023-07-29 NOTE — Group Note (Signed)
 Group Topic: Emotional Regulation  Group Date: 07/29/2023 Start Time: 1205 End Time: 1230 Facilitators: Concha Norway, NT  Department: [facility]  Number of Participants: 4  Group Focus: acceptance Treatment Modality:  Eclectic Therapy Interventions utilized were clarification Purpose: express irrational fears  Name: Brendan Ochoa Date of Birth: 03-05-1976  MR: [id number]    Level of Participation: moderate Quality of Participation: attentive Interactions with others: intrusive Mood/Affect: anxious Triggers (if applicable):   Cognition: concrete Progress: Significant Response:   Plan: follow-up needed  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

## 2023-07-29 NOTE — ED Notes (Signed)
 Pt discharged with AVS. AVS reviewed prior to discharge with this Clinical research associate. Pt alert, oriented, and ambulatory. Safety maintained.

## 2023-07-29 NOTE — ED Notes (Signed)
 Patient alert & oriented x4. Denies intent to harm self or others when asked. Denies A/VH. Patient denies any physical complaints when asked. Patient reports tiredness. No acute distress noted. Scheduled medications administered with no complications. Support and encouragement provided. Routine safety checks conducted per facility protocol. Encouraged patient to notify staff if any thoughts of harm towards self or others arise. Patient verbalizes understanding and agreement.

## 2023-07-29 NOTE — Group Note (Signed)
 Group Topic: Social Support  Group Date: 07/29/2023 Start Time: 1030 End Time: 1106 Facilitators: Aaran Enberg, Jacklynn Barnacle, RN  Department: [facility]  Number of Participants: 6  Group Focus: abuse issues Treatment Modality:  Interpersonal Therapy Interventions utilized were exploration and support Purpose: improve communication skills  Name: Brendan Ochoa Date of Birth: [birth year]  MR: [id number]    Level of Participation: did not attend Quality of Participation:  Interactions with others:  Mood/Affect:  Triggers (if applicable):  Cognition: Progress: None Response:  Plan: patient will be encouraged to attend future groups/programing on the unit  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

## 2023-07-29 NOTE — ED Notes (Signed)
 Patient resting with eyes closed in no apparent acute distress. Respirations even and unlabored. Environment secured. Safety checks in place according to facility policy.

## 2023-07-29 NOTE — Patient Outreach (Signed)
 Care Coordination   07/29/2023 Name: Brendan Ochoa MRN: [id number] DOB: 1976/02/23   Care Coordination Outreach Attempts:  An unsuccessful telephone outreach was attempted today to offer the patient information about available complex care management services.  Follow Up Plan:  Additional outreach attempts will be made to offer the patient complex care management information and services.   Encounter Outcome:  No Answer   Care Coordination Interventions:  No, not indicated     Lorna Few  MSW, LCSW /[facility] Licensed Clinical Social Worker Direct Dial:  [phone] Fax:  [phone] Website:  [email or web address]

## 2023-07-29 NOTE — Group Note (Signed)
 Group Topic: Recovery Basics  Group Date: 07/29/2023 Start Time: 1610 End Time: 1616 Facilitators: Armoni Kludt, Jacklynn Barnacle, RN; Wonda Cheng, LPN  Department: [facility]  Number of Participants: 1  Group Focus: discharge education Treatment Modality:  Individual Therapy Interventions utilized were patient education Purpose: increase insight  Name: Brendan Ochoa Date of Birth: [birth year]  MR: [id number]    Level of Participation: active Quality of Participation: attentive Interactions with others: gave feedback Mood/Affect: appropriate Triggers (if applicable): None identified Cognition: coherent/clear Progress: Significant Response: Patient voiced understanding of all discharge instructions as present. Suicide safety plan completed and reviewed. Plan: patient will be encouraged to refer to suicide safety plan as needed, reach out for additional support as needed.  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   Epididymal cyst 06/14/2020   Unintentional weight loss 06/14/2020   Abnormal chest x-ray 06/14/2020   Elevated blood pressure reading without diagnosis of hypertension 05/27/2020   Influenza vaccination declined 05/27/2020   AKI (acute kidney injury) (HCC) 01/01/2020   Vomiting 01/01/2020   Cocaine abuse (HCC) 01/01/2020   Alcohol abuse 01/01/2020   Ileitis 02/18/2018    HYPERGLYCEMIA 11/01/2009   Tobacco dependence 07/18/2007   SUBSTANCE ABUSE, MULTIPLE 07/18/2007   Allergic rhinitis 07/18/2007   Asthma 07/18/2007   GERD 07/18/2007   Crohn's disease (HCC) 07/18/2007

## 2023-07-29 NOTE — Discharge Instructions (Signed)
 [facility] [address], [place], [address] [phone] phone  New Patient Assessment/Therapy Walk-Ins:  Monday and Wednesday: 8 am until slots are full. Every 1st and 2nd Fridays of the month: 1 pm - 5 pm.  NO ASSESSMENT/THERAPY WALK-INS ON TUESDAYS OR THURSDAYS  New Patient Assessment/Medication Management Walk-Ins:  Monday - Friday:  8 am - 11 am.  For all walk-ins, we ask that you arrive by 7:30 am because patients will be seen in the order of arrival.  Availability is limited; therefore, you may not be seen on the same day that you walk-in.  Our goal is to serve and meet the needs of our community to the best of our [name] ability.  SUBSTANCE USE TREATMENT for Medicaid and State Funded/IPRS  [facility] [address], [place], [address] [phone] phone NOTE: [facility] is no longer offering IOP services.  Serves those who are low-income or have no insurance.  [facility] [address] [place], [place], [address] [phone] phone [phone] fax NOTE: Does have Substance Abuse-Intensive Outpatient Program [facility]) as well as transitional housing if eligible.  [facility] [address] [place], [place], [address] [phone] phone [phone] fax  [facility] [phone] [place], [place], [address] [phone] phone [phone] fax  HALFWAY HOUSES:  [facility] [phone]  [email or web address]  12 STEP PROGRAMS:  Alcoholics Anonymous of [place] SoftwareChalet.be  Narcotics Anonymous of [place] [employer]ct.dk  Al-Anon of [place], [place] www.[place]alanon.org/find-meetings.html  Nar-Anon https://nar-anon.org/find-a-meetin  List of Residential placements:   [facility] in [place]: [phone]  [facility]: [phone]  [place], [place]  [phone]: Male and male facility; 30-day program: (uninsured and Medicaid such as Laurena Bering, [place], [name], partners)  [facility]: [phone]; men and women's facility; 28 days; Can have Medicaid tailored plan Tour manager or Partners)  [facility]: [phone] Karoline Caldwell or Larita Fife; 28 day program; must be fully detox; tailored Medicaid or no insurance  [address] in [place], [place]; [phone]; 28 day all males program; no insurance accepted  [facility] Referral in [place]: Gabriel Rung [phone] (no insurance or Medicaid only); 90 days; outpatient services but provide housing in apartments [place]  [facility] Admission: [phone]: Patient must complete phone screening for placement: [place], [place]; 6 month program; uninsured, Medicaid, and [employer].   [facility]: no insurance required; [phone]  [facility]: [phone]; Intake: Molly Maduro; Must fill out application online; Alecia Lemming Delay [phone] x [address] [facility] in [place], [place]: [phone]; Admissions Coordinators Mr. Maurine Minister or Barron Alvine; 90 day program.  [facility]: [place], [place] [phone]; Co-Ed 9 month to a year program; Online application; Men entry fee is $500 (6-53months);  [employer]: [address] [place], [place] [address]; no fee or insurance required; minimum of 2 years; Highly structured; work based; Intake Coordinator is Thayer Ohm [phone]  [facility] in [place], [place]: [phone]; Fax number is [phone]; website: www.[facility].org; Requires 3-6 page autobiography; 2 year program (18 months and then 25month transitional housing); Admission fee is $300; no insurance needed; work Automotive engineer in [place], [place]: [employer] Desk Staff: Danise Edge [phone]: They have a Men's Regenerations Program 6-39months. Free program; There is an initial $300 fee however, they are willing to work  with patients regarding that. Application is online.  [facility]: Admissions [phone] Doran Heater ext [phone]; Any 7-90 day program is out of pocket; 12  month program is free of charge; there is a $275 entry fee; Patient is responsible for own transportation

## 2023-07-29 NOTE — ED Provider Notes (Signed)
 FBC/OBS ASAP Discharge Summary  Date and Time: 07/30/2023 2:02 PM  Name: Brendan Ochoa  MRN:  [id number]   Discharge Diagnoses:  Final diagnoses:  Alcohol use disorder    Stay Summary:  Brendan Ochoa is a 48 y.o. male with a psychiatric hx of MDD, GAD, sleep disturbances, alcohol use disorder, and a remote hx of stimulant use disorder who was admitted to [facility] voluntarily for alcohol detox.   During the patient's hospitalization at the [facility], patient had extensive initial psychiatric evaluation, with daily follow-up assessments focused on detoxification management.  Patient's medications adjusted during hospitalization:   #History of bipolar disorder (R/O substance-induced mood disorder versus substance-induced bipolar disorder) - Restarted home Abilify 5 mg daily for mood - Restarted home Zoloft 100 mg daily for depressive symptoms.   #Alcohol use disorder --Patient was initially started on librium taper, requested this be discontinued since his CIWAs were <5 over 48 hours and reported to withdrawal symptoms --He was started on naltrexone 50 mg daily   Patient's care was discussed during the interdisciplinary team meeting every day during the hospitalization.  The patient denies having side effects to prescribed psychiatric medication.  Gradually, patient started adjusting to milieu. The patient was evaluated each day by a clinical provider to ascertain response to treatment. Improvement was noted by the patient's report of decreasing symptoms, improved sleep and appetite, affect, medication tolerance, behavior, and participation in unit programming.  Patient was asked each day to complete a self inventory noting mood, mental status, pain, new symptoms, anxiety and concerns.    Symptoms were reported as significantly decreased or resolved completely by discharge.   On day of discharge, the patient reports that their mood is stable. The patient denied having suicidal thoughts  for more than 48 hours prior to discharge.  Patient denies having homicidal thoughts.  Patient denies having auditory hallucinations.  Patient denies any visual hallucinations or other symptoms of psychosis. The patient was motivated to continue taking medication with a goal of continued improvement in mental health.   The patient reported that their withdrawal symptoms and cravings responded well to the detox regimen, with overall benefit from the detox program. Supportive psychotherapy was provided, and the patient participated in regular group therapy sessions focused on managing cravings and withdrawal. Coping skills, problem-solving, and relaxation techniques were also part of the program's therapeutic interventions.  Labs were reviewed with the patient, and abnormal results were discussed with the patient.  The patient is able to verbalize their individual safety plan to this provider.  # It is recommended to the patient to continue psychiatric medications as prescribed, after discharge from the hospital.    # It is recommended to the patient to follow up with their outpatient psychiatric provider and PCP.  # It was discussed with the patient, the impact of alcohol, drugs, tobacco have been there overall psychiatric and medical wellbeing, and total abstinence from substance use was recommended.  # Prescriptions provided or sent directly to preferred pharmacy at discharge. Patient agreeable to plan. Given opportunity to ask questions. Appears to feel comfortable with discharge.    # In the event of worsening symptoms, the patient is instructed to call the crisis hotline, 911 and or go to the nearest ED for appropriate evaluation and treatment of symptoms. To follow-up with primary care provider for other medical issues, concerns and or health care needs  # Patient was discharged Home with a plan to follow up as noted below.  Total Time spent with patient: 15 minutes  Past Psychiatric  History: Patient endorses a past history of bipolar disorder. Patient has also been diagnosed with major depressive disorder. Patient had Patient struggles with alcohol abuse and has also struggled with drug abuse in the past.  Patient had outpatient follow up at [facility] for medication management. Current psychotropic medications: abilify, zoloft  Family Psychiatric  History: Grandfather (maternal) - Bipolar disorder   Patient reports that he is unsure of anyone else with psychiatric issues and states that his mother's side of the family has a history of mental illness.   Family history of suicide: [place] uncle (maternal)   Patient denies a family history of homicide Substance abuse, patient states that 90% of of his cousins either abused alcohol or drugs     Tobacco Cessation:  A prescription for an FDA-approved tobacco cessation medication provided at discharge  Current Medications:  No current facility-administered medications for this encounter.   Current Outpatient Medications  Medication Sig Dispense Refill   ARIPiprazole (ABILIFY) 5 MG tablet Take 1 tablet (5 mg total) by mouth daily. (Patient not taking: Reported on 07/25/2023) 30 tablet 1   fluticasone (FLONASE) 50 MCG/ACT nasal spray Place 1 spray into both nostrils daily. 16 g 2   loratadine (CLARITIN) 10 MG tablet Take 1 tablet (10 mg total) by mouth daily. (Patient not taking: Reported on 07/25/2023) 30 tablet 1   Multiple Vitamin (MULTIVITAMIN WITH MINERALS) TABS tablet Take 1 tablet by mouth daily.     naltrexone (DEPADE) 50 MG tablet Take 1 tablet (50 mg total) by mouth daily. 30 tablet 0   nicotine (NICODERM CQ - DOSED IN MG/24 HOURS) 14 mg/24hr patch Place 1 patch (14 mg total) onto the skin daily.     sertraline (ZOLOFT) 100 MG tablet Take 100 mg by mouth daily. (Patient not taking: Reported on 07/28/2023)      PTA Medications:  PTA Medications  Medication Sig   loratadine (CLARITIN) 10 MG tablet Take 1 tablet (10 mg  total) by mouth daily. (Patient not taking: Reported on 07/25/2023)   ARIPiprazole (ABILIFY) 5 MG tablet Take 1 tablet (5 mg total) by mouth daily. (Patient not taking: Reported on 07/25/2023)   fluticasone (FLONASE) 50 MCG/ACT nasal spray Place 1 spray into both nostrils daily.   Multiple Vitamin (MULTIVITAMIN WITH MINERALS) TABS tablet Take 1 tablet by mouth daily.   naltrexone (DEPADE) 50 MG tablet Take 1 tablet (50 mg total) by mouth daily.   nicotine (NICODERM CQ - DOSED IN MG/24 HOURS) 14 mg/24hr patch Place 1 patch (14 mg total) onto the skin daily.   Facility Ordered Medications  Medication   [COMPLETED] thiamine (VITAMIN B1) injection 100 mg   [COMPLETED] chlordiazePOXIDE (LIBRIUM) capsule 25 mg       07/29/2023    3:44 PM 07/28/2023   12:38 AM 07/26/2023   11:19 AM  Depression screen PHQ 2/9  Decreased Interest 0 1 3  Down, Depressed, Hopeless 0 1 3  PHQ - 2 Score 0 2 6  Altered sleeping 0 1 3  Tired, decreased energy 0 1 3  Change in appetite 0 0 3  Feeling bad or failure about yourself  0 1 3  Trouble concentrating 0 0 3  Moving slowly or fidgety/restless 0 0 2  Suicidal thoughts 0 0 0  PHQ-9 Score 0 5 23  Difficult doing work/chores Not difficult at all Somewhat difficult Extremely dIfficult    Flowsheet Row ED from 07/28/2023 in [place]  [facility] ED from 07/27/2023 in [facility] Video Visit from 07/26/2023 in [facility]  C-SSRS RISK CATEGORY No Risk No Risk Low Risk       Musculoskeletal  Strength & Muscle Tone: within normal limits Gait & Station: normal Patient leans: N/A  Psychiatric Specialty Exam  Presentation  General Appearance:  Appropriate for Environment  Eye Contact: Good  Speech: Clear and Coherent; Normal Rate  Speech Volume: Normal  Handedness: -- (not assessed)   Mood and Affect  Mood: -- ("better")  Affect: Congruent; Full Range   Thought  Process  Thought Processes: Linear  Descriptions of Associations:Intact  Orientation:None  Thought Content:Logical  Diagnosis of Schizophrenia or Schizoaffective disorder in past: No    Hallucinations:Hallucinations: None  Ideas of Reference:None  Suicidal Thoughts:Suicidal Thoughts: No  Homicidal Thoughts:Homicidal Thoughts: No   Sensorium  Memory: Immediate Good; Recent Good; Remote Good  Judgment: Fair  Insight: Fair   Chartered certified accountant: Fair  Attention Span: Fair  Recall: [employer] of Knowledge: Fair  Language: Fair   Psychomotor Activity  Psychomotor Activity: Psychomotor Activity: Normal   Assets  Assets: Desire for Improvement; Resilience; Communication Skills   Sleep  Sleep: Sleep: Fair   Nutritional Assessment (For OBS and FBC admissions only) Has the patient had a weight loss or gain of 10 pounds or more in the last 3 months?: No Has the patient had a decrease in food intake/or appetite?: No Does the patient have dental problems?: No Does the patient have eating habits or behaviors that may be indicators of an eating disorder including binging or inducing vomiting?: No Has the patient recently lost weight without trying?: 0 Has the patient been eating poorly because of a decreased appetite?: 0 Malnutrition Screening Tool Score: 0    Physical Exam  Physical Exam Vitals and nursing note reviewed.  Constitutional:      General: He is not in acute distress.    Appearance: He is not ill-appearing.  HENT:     Head: Normocephalic and atraumatic.  Eyes:     Extraocular Movements: Extraocular movements intact.     Conjunctiva/sclera: Conjunctivae normal.  Pulmonary:     Effort: Pulmonary effort is normal. No respiratory distress.  Skin:    General: Skin is warm and dry.  Neurological:     General: No focal deficit present.     Motor: No weakness.     Gait: Gait normal.    Review of Systems  All other  systems reviewed and are negative.  Blood pressure 115/75, pulse 83, temperature 97.7 F (36.5 C), temperature source Oral, resp. rate 18, SpO2 97%. There is no height or weight on file to calculate BMI.  Demographic Factors:  Male  Loss Factors: NA  Historical Factors: Impulsivity  Risk Reduction Factors:   Positive social support, Positive therapeutic relationship, and Positive coping skills or problem solving skills  Continued Clinical Symptoms:  Alcohol/Substance Abuse/Dependencies Previous Psychiatric Diagnoses and Treatments  Cognitive Features That Contribute To Risk:  None    Suicide Risk:  Mild:  Suicidal ideation of limited frequency, intensity, duration, and specificity.  There are no identifiable plans, no associated intent, mild dysphoria and related symptoms, good self-control (both objective and subjective assessment), few other risk factors, and identifiable protective factors, including available and accessible social support.  Plan Of Care/Follow-up recommendations:  Activity: as tolerated  Diet: heart healthy  Other: -Follow-up with your outpatient psychiatric provider -instructions on appointment date,  time, and address (location) are provided to you in discharge paperwork.  -Take your psychiatric medications as prescribed at discharge - instructions are provided to you in the discharge paperwork  -If you are prescribed an atypical antipsychotic medication, we recommend that your outpatient psychiatrist follow routine screening for side effects within 3 months of discharge, including monitoring: AIMS scale, height, weight, blood pressure, fasting lipid panel, HbA1c, and fasting blood sugar.   -Recommend total abstinence from alcohol, tobacco, and other illicit drug use at discharge.   -If your psychiatric symptoms recur, worsen, or if you have side effects to your psychiatric medications, call your outpatient psychiatric provider, 911, [phone] or go to the  nearest emergency department.  -If suicidal thoughts occur, immediately call your outpatient psychiatric provider, 911, 988 or go to the nearest emergency department.   Disposition: Home   Signed: Lorri Frederick, MD 07/30/2023, 2:02 PM

## 2023-07-29 NOTE — ED Provider Notes (Signed)
 Behavioral Health Progress Note  Date and Time: 07/29/2023 11:14 AM Name: Brendan Ochoa MRN:  [id number]  Subjective:  Brendan Ochoa is a 48 y.o. male with a psychiatric hx of MDD, GAD, sleep disturbances, alcohol use disorder, and a remote hx of stimulant use disorder who was admitted to [facility] voluntarily for alcohol detox.   Patient was evaluated at bedside today, reports he has been improving since admission.Reports his withdrawals have been improving, reports his tremors are less prominent. He reports a lot of fatigue this morning, but overall improving. Denies any side effects to prescribed medications. Patient reports appetite has been improving. He reports he has been having difficulty getting to sleep in the evenings but is able to stay asleep.   The patient denies any suicidal ideations on interview.  Denies homicidal ideations.  He denies any auditory or visual hallucinations.  Patient denies any side effects from his currently scheduled psychotropic medications.  He agrees to have his Abilify and Zoloft restarted today.  No somatic complaints, reports regular bowel movements.  Diagnosis:  Final diagnoses:  Alcohol use disorder    Total Time spent with patient: 45 minutes  Past Psychiatric History: Patient endorses a past history of bipolar disorder. Patient has also been diagnosed with major depressive disorder. Patient struggles with alcohol abuse and has also struggled with drug abuse in the past    Past Medical History:  Past Medical History:  Diagnosis Date   Asthma    Cocaine abuse (HCC)    in past   Crohn disease (HCC) 2008   Depression    in past, not current as of 2019    ETOH abuse    history of alcohol use   GERD (gastroesophageal reflux disease)     Family History:  Family History  Problem Relation Age of Onset   Stomach cancer Maternal Grandfather    Colon cancer Neg Hx     Family Psychiatric  History: Grandfather (maternal) - Bipolar disorder    Patient reports that he is unsure of anyone else with psychiatric issues and states that his mother's side of the family has a history of mental illness.   Family history of suicide: Great uncle (maternal)   Patient denies a family history of homicide   Substance abuse, patient states that 90% of of his cousins either abused alcohol or drugs Social History:  Social History   Socioeconomic History   Marital status: Single    Spouse name: Not on file   Number of children: Not on file   Years of education: Not on file   Highest education level: Not on file  Occupational History   Not on file  Tobacco Use   Smoking status: Every Day    Current packs/day: 0.50    Average packs/day: 0.5 packs/day for 15.0 years (7.5 ttl pk-yrs)    Types: Cigarettes   Smokeless tobacco: Never   Tobacco comments:    1/4 to 1/2 pack per day   Vaping Use   Vaping status: Never Used  Substance and Sexual Activity   Alcohol use: Not Currently    Comment: alcoholic- last use 11/10/20   Drug use: Not Currently    Types: Cocaine, Marijuana, Methamphetamines    Comment: last cocaine use 10/10/20.  last meth april 2022   Sexual activity: Not on file  Other Topics Concern   Not on file  Social History Narrative   Not on file   Social Drivers of Health   Financial Resource  Strain: Not on file  Food Insecurity: Food Insecurity Present (07/28/2023)   Hunger Vital Sign    Worried About Running Out of Food in the Last Year: Sometimes true    Ran Out of Food in the Last Year: Sometimes true  Transportation Needs: No Transportation Needs (07/28/2023)   PRAPARE - Administrator, Civil Service (Medical): No    Lack of Transportation (Non-Medical): No  Physical Activity: Not on file  Stress: Not on file  Social Connections: Not on file  Intimate Partner Violence: Not At Risk (07/28/2023)   Humiliation, Afraid, Rape, and Kick questionnaire    Fear of Current or Ex-Partner: No    Emotionally  Abused: No    Physically Abused: No    Sexually Abused: No     Additional Social History:                         Sleep: Fair  Appetite:  Fair  Current Medications:  Current Facility-Administered Medications  Medication Dose Route Frequency Provider Last Rate Last Admin   acetaminophen (TYLENOL) tablet 650 mg  650 mg Oral Q6H PRN Bobbitt, Shalon E, NP       alum & mag hydroxide-simeth (MAALOX/MYLANTA) 200-200-20 MG/5ML suspension 30 mL  30 mL Oral Q4H PRN Bobbitt, Shalon E, NP       ARIPiprazole (ABILIFY) tablet 5 mg  5 mg Oral Daily Carrion-Carrero, Gable Odonohue, MD   5 mg at 07/29/23 0959   chlordiazePOXIDE (LIBRIUM) capsule 25 mg  25 mg Oral Q6H PRN Lamar Sprinkles, MD       chlordiazePOXIDE (LIBRIUM) capsule 25 mg  25 mg Oral TID Lamar Sprinkles, MD   25 mg at 07/29/23 1610   Followed by   Melene Muller ON 07/30/2023] chlordiazePOXIDE (LIBRIUM) capsule 25 mg  25 mg Oral Alvy Beal, MD       Followed by   Melene Muller ON 07/31/2023] chlordiazePOXIDE (LIBRIUM) capsule 25 mg  25 mg Oral Daily Cosby, Toni Amend, MD       haloperidol (HALDOL) tablet 5 mg  5 mg Oral TID PRN Bobbitt, Shalon E, NP       And   diphenhydrAMINE (BENADRYL) capsule 50 mg  50 mg Oral TID PRN Bobbitt, Shalon E, NP       haloperidol lactate (HALDOL) injection 5 mg  5 mg Intramuscular TID PRN Bobbitt, Shalon E, NP       And   diphenhydrAMINE (BENADRYL) injection 50 mg  50 mg Intramuscular TID PRN Bobbitt, Shalon E, NP       And   LORazepam (ATIVAN) injection 2 mg  2 mg Intramuscular TID PRN Bobbitt, Shalon E, NP       haloperidol lactate (HALDOL) injection 10 mg  10 mg Intramuscular TID PRN Bobbitt, Shalon E, NP       And   diphenhydrAMINE (BENADRYL) injection 50 mg  50 mg Intramuscular TID PRN Bobbitt, Shalon E, NP       And   LORazepam (ATIVAN) injection 2 mg  2 mg Intramuscular TID PRN Bobbitt, Shalon E, NP       [START ON 07/31/2023] hydrOXYzine (ATARAX) tablet 25 mg  25 mg Oral TID PRN Bobbitt, Shalon  E, NP       hydrOXYzine (ATARAX) tablet 25 mg  25 mg Oral Q6H PRN Bobbitt, Shalon E, NP   25 mg at 07/28/23 1602   loperamide (IMODIUM) capsule 2-4 mg  2-4 mg Oral PRN Bobbitt, Franchot Mimes, NP  magnesium hydroxide (MILK OF MAGNESIA) suspension 30 mL  30 mL Oral Daily PRN Bobbitt, Shalon E, NP       multivitamin with minerals tablet 1 tablet  1 tablet Oral Daily Bobbitt, Shalon E, NP   1 tablet at 07/29/23 0959   naltrexone (DEPADE) tablet 50 mg  50 mg Oral Daily Lamar Sprinkles, MD   50 mg at 07/29/23 0959   ondansetron (ZOFRAN-ODT) disintegrating tablet 4 mg  4 mg Oral Q6H PRN Bobbitt, Shalon E, NP   4 mg at 07/28/23 1603   sertraline (ZOLOFT) tablet 100 mg  100 mg Oral Daily Carrion-Carrero, Tyrah Broers, MD   100 mg at 07/29/23 0959   thiamine (VITAMIN B1) tablet 100 mg  100 mg Oral Daily Bobbitt, Shalon E, NP   100 mg at 07/29/23 0959   traZODone (DESYREL) tablet 50 mg  50 mg Oral QHS PRN Bobbitt, Shalon E, NP   50 mg at 07/28/23 2113   Current Outpatient Medications  Medication Sig Dispense Refill   albuterol (VENTOLIN HFA) 108 (90 Base) MCG/ACT inhaler Inhale 2 puffs into the lungs every 6 (six) hours as needed for wheezing or shortness of breath. (Patient not taking: Reported on 07/25/2023) 8 g 1   amoxicillin (AMOXIL) 500 MG capsule Take 2 capsules (1,000 mg total) by mouth 2 (two) times daily for 10 days. 40 capsule 0   ARIPiprazole (ABILIFY) 5 MG tablet Take 1 tablet (5 mg total) by mouth daily. (Patient not taking: Reported on 07/25/2023) 30 tablet 1   fluticasone (FLONASE) 50 MCG/ACT nasal spray Place 1 spray into both nostrils daily. 16 g 2   loratadine (CLARITIN) 10 MG tablet Take 1 tablet (10 mg total) by mouth daily. (Patient not taking: Reported on 07/25/2023) 30 tablet 1   predniSONE (DELTASONE) 10 MG tablet Take 6 tablets (60 mg total) by mouth daily for 1 day, THEN 5 tablets (50 mg total) daily for 1 day, THEN 4 tablets (40 mg total) daily for 1 day, THEN 3 tablets (30 mg total) daily  for 1 day, THEN 2 tablets (20 mg total) daily for 1 day, THEN 1 tablet (10 mg total) daily for 1 day. Take with food. 21 tablet 0   sertraline (ZOLOFT) 100 MG tablet Take 100 mg by mouth daily. (Patient not taking: Reported on 07/28/2023)      Labs  Lab Results:  Admission on 07/28/2023  Component Date Value Ref Range Status   POC Amphetamine UR 07/28/2023 None Detected  NONE DETECTED (Cut Off Level 1000 ng/mL) Final   POC Secobarbital (BAR) 07/28/2023 None Detected  NONE DETECTED (Cut Off Level 300 ng/mL) Final   POC Buprenorphine (BUP) 07/28/2023 None Detected  NONE DETECTED (Cut Off Level 10 ng/mL) Final   POC Oxazepam (BZO) 07/28/2023 None Detected  NONE DETECTED (Cut Off Level 300 ng/mL) Final   POC Cocaine UR 07/28/2023 None Detected  NONE DETECTED (Cut Off Level 300 ng/mL) Final   POC Methamphetamine UR 07/28/2023 None Detected  NONE DETECTED (Cut Off Level 1000 ng/mL) Final   POC Morphine 07/28/2023 None Detected  NONE DETECTED (Cut Off Level 300 ng/mL) Final   POC Methadone UR 07/28/2023 None Detected  NONE DETECTED (Cut Off Level 300 ng/mL) Final   POC Oxycodone UR 07/28/2023 None Detected  NONE DETECTED (Cut Off Level 100 ng/mL) Final   POC Marijuana UR 07/28/2023 Positive (A)  NONE DETECTED (Cut Off Level 50 ng/mL) Final  Admission on 07/27/2023, Discharged on 07/28/2023  Component Date Value Ref Range  Status   WBC 07/27/2023 5.4  4.0 - 10.5 K/uL Final   RBC 07/27/2023 4.79  4.22 - 5.81 MIL/uL Final   Hemoglobin 07/27/2023 14.9  13.0 - 17.0 g/dL Final   HCT 81/19/1478 43.0  39.0 - 52.0 % Final   MCV 07/27/2023 89.8  80.0 - 100.0 fL Final   MCH 07/27/2023 31.1  26.0 - 34.0 pg Final   MCHC 07/27/2023 34.7  30.0 - 36.0 g/dL Final   RDW 29/56/2130 13.2  11.5 - 15.5 % Final   Platelets 07/27/2023 242  150 - 400 K/uL Final   nRBC 07/27/2023 0.0  0.0 - 0.2 % Final   Neutrophils Relative % 07/27/2023 40  % Final   Neutro Abs 07/27/2023 2.2  1.7 - 7.7 K/uL Final   Lymphocytes  Relative 07/27/2023 52  % Final   Lymphs Abs 07/27/2023 2.8  0.7 - 4.0 K/uL Final   Monocytes Relative 07/27/2023 6  % Final   Monocytes Absolute 07/27/2023 0.4  0.1 - 1.0 K/uL Final   Eosinophils Relative 07/27/2023 1  % Final   Eosinophils Absolute 07/27/2023 0.1  0.0 - 0.5 K/uL Final   Basophils Relative 07/27/2023 1  % Final   Basophils Absolute 07/27/2023 0.0  0.0 - 0.1 K/uL Final   Immature Granulocytes 07/27/2023 0  % Final   Abs Immature Granulocytes 07/27/2023 0.01  0.00 - 0.07 K/uL Final   Performed at [facility], [address], [place], [place] [address]   Sodium 07/27/2023 139  135 - 145 mmol/L Final   Potassium 07/27/2023 3.8  3.5 - 5.1 mmol/L Final   Chloride 07/27/2023 101  98 - 111 mmol/L Final   CO2 07/27/2023 23  22 - 32 mmol/L Final   Glucose, Bld 07/27/2023 91  70 - 99 mg/dL Final   Glucose reference range applies only to samples taken after fasting for at least 8 hours.   BUN 07/27/2023 13  6 - 20 mg/dL Final   Creatinine, Ser 07/27/2023 0.79  0.61 - 1.24 mg/dL Final   Calcium 46/96/2952 9.1  8.9 - 10.3 mg/dL Final   Total Protein 84/13/2440 6.2 (L)  6.5 - 8.1 g/dL Final   Albumin 03/10/2535 3.6  3.5 - 5.0 g/dL Final   AST 64/40/3474 44 (H)  15 - 41 U/L Final   ALT 07/27/2023 27  0 - 44 U/L Final   Alkaline Phosphatase 07/27/2023 63  38 - 126 U/L Final   Total Bilirubin 07/27/2023 0.5  0.0 - 1.2 mg/dL Final   GFR, Estimated 07/27/2023 >60  >60 mL/min Final   Comment: (NOTE) Calculated using the CKD-EPI Creatinine Equation (2021)    Anion gap 07/27/2023 15  5 - 15 Final   Performed at [facility], [address], [place], [place] [address]   TSH 07/27/2023 2.054  0.350 - 4.500 uIU/mL Final   Comment: Performed by a 3rd Generation assay with a functional sensitivity of <=0.01 uIU/mL. Performed at [facility], [address], [place], [place] [address]    Alcohol, Ethyl (B) 07/27/2023 154 (H)  <10 mg/dL Final   Comment: (NOTE) Lowest  detectable limit for serum alcohol is 10 mg/dL.  For medical purposes only. Performed at [facility], [address], [place], [place] [address]    Magnesium 07/27/2023 2.1  1.7 - 2.4 mg/dL Final   Performed at [facility], [address] [place], [place] [address]   Cholesterol 07/27/2023 184  0 -  200 mg/dL Final   Triglycerides 64/33/2951 544 (H)  <150 mg/dL Final   HDL 88/41/6606 69  >40 mg/dL Final   Total CHOL/HDL Ratio 07/27/2023 2.7  RATIO Final   VLDL 07/27/2023 UNABLE TO CALCULATE IF TRIGLYCERIDE OVER 400 mg/dL  0 - 40 mg/dL Final   LDL Cholesterol 07/27/2023 UNABLE TO CALCULATE IF TRIGLYCERIDE OVER 400 mg/dL  0 - 99 mg/dL Final   Comment:        Total Cholesterol/HDL:CHD Risk Coronary Heart Disease Risk Table                     Men   Women  1/2 Average Risk   3.4   3.3  Average Risk       5.0   4.4  2 X Average Risk   9.6   7.1  3 X Average Risk  23.4   11.0        Use the calculated Patient Ratio above and the CHD Risk Table to determine the patient's CHD Risk.        ATP III CLASSIFICATION (LDL):  <100     mg/dL   Optimal  301-601  mg/dL   Near or Above                    Optimal  130-159  mg/dL   Borderline  093-235  mg/dL   High  >573     mg/dL   Very High Performed at [facility], [address] [place], [place] [address]    Direct LDL 07/27/2023 65  0 - 99 mg/dL Final   Performed at [facility], [address], [place], [place] [address]  Office Visit on 07/25/2023  Component Date Value Ref Range Status   POC Glucose 07/25/2023 144 (A)  70 - 99 mg/dl Final   Hemoglobin C3J 07/25/2023 5.6  4.0 - 5.6 % Final    Blood Alcohol level:  Lab Results  Component Value Date   ETH 154 (H) 07/27/2023   ETH 211 (H) 05/03/2022    Metabolic Disorder Labs: Lab Results  Component Value Date   HGBA1C 5.6 07/25/2023   MPG 120 05/03/2022   MPG 105.41 12/01/2021   Lab Results  Component Value Date   PROLACTIN 4.0 05/03/2022    PROLACTIN 9.7 06/08/2021   Lab Results  Component Value Date   CHOL 184 07/27/2023   TRIG 544 (H) 07/27/2023   HDL 69 07/27/2023   CHOLHDL 2.7 07/27/2023   VLDL UNABLE TO CALCULATE IF TRIGLYCERIDE OVER 400 mg/dL 62/83/1517   LDLCALC UNABLE TO CALCULATE IF TRIGLYCERIDE OVER 400 mg/dL 61/60/7371   LDLCALC 75 05/03/2022    Therapeutic Lab Levels: No results found for: "LITHIUM" No results found for: "VALPROATE" No results found for: "CBMZ"  Physical Findings   GAD-7    Flowsheet Row Video Visit from 07/26/2023 in [facility] Office Visit from 07/25/2023 in [facility] [place]. [facility] Video Visit from 06/13/2023 in [facility] Video Visit from 04/10/2023 in [facility] Video Visit from 02/06/2023 in [facility]  Total GAD-7 Score 16 17 13 2 1       [id number]    Flowsheet Row ED from 07/28/2023 in [facility] Video Visit from 07/26/2023 in [facility] Office Visit from 07/25/2023 in [facility]  [place] [facility] [place]. [facility] Video Visit from 06/13/2023 in [facility] Video Visit from 04/10/2023 in [facility]  PHQ-2 Total Score 2 6 6 6  0  PHQ-9 Total Score 5 23 21 21  --      Flowsheet Row ED from 07/28/2023 in [facility] ED from 07/27/2023 in [facility] Video Visit from 07/26/2023 in [facility]  C-SSRS RISK CATEGORY No Risk No Risk Low Risk        Musculoskeletal  Strength & Muscle Tone: within normal limits Gait & Station: normal Patient leans: N/A  Psychiatric Specialty Exam  Presentation  General Appearance:  Appropriate for Environment  Eye Contact: Good  Speech: Clear and Coherent;  Normal Rate  Speech Volume: Normal  Handedness: -- (not assessed)   Mood and Affect  Mood: -- ("better")  Affect: Congruent; Full Range   Thought Process  Thought Processes: Linear  Descriptions of Associations:Intact  Orientation:None  Thought Content:Logical  Diagnosis of Schizophrenia or Schizoaffective disorder in past: No    Hallucinations:Hallucinations: None  Ideas of Reference:None  Suicidal Thoughts:Suicidal Thoughts: No  Homicidal Thoughts:Homicidal Thoughts: No   Sensorium  Memory: Immediate Good; Recent Good; Remote Good  Judgment: Fair  Insight: Fair   Chartered certified accountant: Fair  Attention Span: Fair  Recall: [employer] of Knowledge: Fair  Language: Fair   Psychomotor Activity  Psychomotor Activity: Psychomotor Activity: Normal   Assets  Assets: Desire for Improvement; Resilience; Communication Skills   Sleep  Sleep: Fair   Nutritional Assessment (For OBS and FBC admissions only) Has the patient had a weight loss or gain of 10 pounds or more in the last 3 months?: No Has the patient had a decrease in food intake/or appetite?: No Does the patient have dental problems?: No Does the patient have eating habits or behaviors that may be indicators of an eating disorder including binging or inducing vomiting?: No Has the patient recently lost weight without trying?: 0 Has the patient been eating poorly because of a decreased appetite?: 0 Malnutrition Screening Tool Score: 0    Physical Exam  Physical Exam Vitals reviewed.  Constitutional:      General: He is not in acute distress.    Appearance: He is not diaphoretic.  HENT:     Head: Normocephalic and atraumatic.     Mouth/Throat:     Mouth: Mucous membranes are moist.     Pharynx: Oropharynx is clear.  Pulmonary:     Effort: Pulmonary effort is normal.  Musculoskeletal:     Comments: Missing a digit on right hand  Neurological:      General: No focal deficit present.     Mental Status: He is alert and oriented to person, place, and time.     Comments: Mild tremor when hands extended bilaterally.     Review of Systems  Constitutional:  Negative for diaphoresis and malaise/fatigue.  Cardiovascular:  Negative for chest pain.  Gastrointestinal:  Negative for abdominal pain, nausea and vomiting.  Neurological:  Positive for tremors. Negative for dizziness.   Blood pressure 115/75, pulse 83, temperature 97.7 F (36.5 C), temperature source Oral, resp. rate 18, SpO2 97%. There is no height or weight on file to calculate BMI.  Treatment Plan Summary: Patient is service-connected and was admitted to [facility] voluntarily in the setting of relapse on alcohol with rapidly increased use.  He currently lives in  an [facility], to which he is able to return on discharge.  Patient is amenable to individual substance use therapy.  Recommend Lauree Chandler or Remigio Eisenmenger to establish care.  Patient poses no safety concerns toward himself nor others at this time.  LCSW to assist with disposition planning.  Daily contact with patient to assess and evaluate symptoms and progress in treatment and Plan    #History of bipolar disorder (R/O substance-induced mood disorder versus substance-induced bipolar disorder) - Restarted home Abilify 5 mg daily for mood - Restarted home Zoloft 100 mg daily for depressive symptoms.  #Alcohol use disorder CIWA Librium protocol initiated: - chlordiazepoxide (Librium) 25 mg 4 times daily x4 doses, 25 mg 3 times daily x3 doses,25 mg 2 times daily x2 doses, 25 mg daily x1 dose -chlordiazepoxide (LIbrium) 25 mg every 6 hours as needed for CIWA greater than 10; Hydroxyzine 25 mg for CIWA less than 10 -Multivitamin with minerals 1 tablet daily -Ondansetron disintegrating tablet 4 mg every 6 as needed/nausea or vomiting -Loperamide 2 to 4 mg oral as needed/diarrhea or loose stools -Thiamine injection 100 mg IM once  followed by 100 mg p.o. daily - Continue naltrexone 50 mg tomorrow for alcohol cravings.  Disposition: Discharge date pending.  Patient reports he will discharge back to his [facility] house when stable and continue medication management with [facility].    Signed: Lorri Frederick, MD 07/29/2023 11:14 AM

## 2023-07-29 NOTE — ED Notes (Signed)
 PRN Tylenol and Imodium given due to patient reports of intestinal cramping/pain (patient states its feels like "a bad Crohn's pain") and patient reports of loose stools. Medication administered with no complications. Environment secured, safety checks in place per facility policy.

## 2023-08-02 ENCOUNTER — Other Ambulatory Visit: Payer: Self-pay

## 2023-08-12 ENCOUNTER — Ambulatory Visit: Payer: Self-pay | Admitting: Licensed Clinical Social Worker

## 2023-08-12 NOTE — Patient Instructions (Signed)
 Visit Information  Thank you for taking time to visit with me today. Please don't hesitate to contact me if I can be of assistance to you.   Following are the goals we discussed today:   Goals Addressed             This Visit's Progress    Patient stated he is an alcoholic. He has been in treatment program before.  He was in detox program recently for 2 days and left program after 2 days       Interventions: Spoke via phone with client about client needs Client and LCSW spoke of client support with PCP.  Client said he sometimes has received care from PA at PCP office  Client has talked with representative at [facility] about Detox support. Client had agreed recently to go to Detox program. He went for 2 days to Detox program and then left.  He said program was trying to monitor him for withdrawal symptoms.  He left after 2 days with Detox so he did not complete Detox program He has some support from his mother. He lives with his mother at home of mother He said he has a brother and a sister. He said his siblings have difficulty with alcohol use as well. Client spoke of reduced sleep and reduced appetite. Marland Kitchen He denied SI.  He does not have HI.  Client has no vehicle to drive. 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   LCSW talked with client about AA and NA support. Client said he has gone to some of these groups in the past and that they were helpful. LCSW asked client if he had sponsors in these groups.  Client did not answer this question Client said he was depressed and he has been prescribed medications to manage mood issues. He said he had been prescribed Zoloft and Abilify . He takes these medications, off and on, per patient.  He is drinking daily. He may drink 10 beers daily.  LCSW asked client where he went to drink. Client said he drinks with some of his friends and that his friends may supply him with beer to drink Discussed medical  support of client.  Client did speak of [employer] program; but he said it cost so much per week to participate in that program and he could not afford to pay for that program at this time Discussed nursing support. Client said he was willing for RN to call him with [facility] program to discuss his nursing needs Client spoke of past Detox program involvement with [facility] and [employer] programs Gave client LCSW name and phone number and encouraged client to call LCSW as needed for SW support at [phone] [facility] client for phone call with LCSW today          Our next appointment is by telephone on 09/10/23 at 3;30 PM   Please call the care guide team at [phone] if you need to cancel or reschedule your appointment.   If you are experiencing a Mental Health or Behavioral Health Crisis or need someone to talk to, please go to [facility] [address] [place] [phone])   The patient verbalized understanding of instructions, educational materials, and care plan provided today and DECLINED offer to receive copy of patient instructions, educational materials, and care plan.   The patient has been provided with contact information for the care management team and  has been advised to call with any health related questions or concerns.    Lorna Few  MSW, LCSW [place] Licensed Clinical Social Worker Direct Dial:  [phone] Fax:  [phone] Website:  [email or web address]

## 2023-08-12 NOTE — Patient Outreach (Signed)
 Care Coordination   Initial Visit Note   08/12/2023 Name: Brendan Ochoa MRN: [id number] DOB: 1975-10-27  Brendan Ochoa is a 48 y.o. year old male who sees Marcine Matar, MD for primary care. I spoke with  Brendan Ochoa by phone today.  What matters to the patients health and wellness today?  Patient stated he is an alcoholic. He has been in treatment program before.  He was in detox program recently for 2 days and left program after 2 days     Goals Addressed             This Visit's Progress    Patient stated he is an alcoholic. He has been in treatment program before.  He was in detox program recently for 2 days and left program after 2 days       Interventions: Spoke via phone with client about client needs Client and LCSW spoke of client support with PCP.  Client said he sometimes has received care from PA at PCP office  Client has talked with representative at [facility] about Detox support. Client had agreed recently to go to Detox program. He went for 2 days to Detox program and then left.  He said program was trying to monitor him for withdrawal symptoms.  He left after 2 days with Detox so he did not complete Detox program He has some support from his mother. He lives with his mother at home of mother He said he has a brother and a sister. He said his siblings have difficulty with alcohol use as well. Client spoke of reduced sleep and reduced appetite. Brendan Ochoa Kitchen He denied SI.  He does not have HI.  Client has no vehicle to drive. 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   LCSW talked with client about AA and NA support. Client said he has gone to some of these groups in the past and that they were helpful. LCSW asked client if he had sponsors in these groups.  Client did not answer this question Client said he was depressed and he has been prescribed medications to manage mood issues. He said he had been prescribed Zoloft  and Abilify . He takes these medications, off and on, per patient.  He is drinking daily. He may drink 10 beers daily.  LCSW asked client where he went to drink. Client said he drinks with some of his friends and that his friends may supply him with beer to drink Discussed medical support of client.  Client did speak of [employer] program; but he said it cost so much per week to participate in that program and he could not afford to pay for that program at this time Discussed nursing support. Client said he was willing for RN to call him with [facility] program to discuss his nursing needs Client spoke of past Detox program involvement with [facility] and [employer] programs Gave client LCSW name and phone number and encouraged client to call LCSW as needed for SW support at [phone] [facility] client for phone call with LCSW today          SDOH assessments and interventions completed:  Yes  SDOH Interventions Today    Flowsheet Row Most Recent Value  SDOH Interventions   Food Insecurity Interventions Other (Comment)  Depression Interventions/Treatment  Currently on Treatment  Stress Interventions Other (Comment)  [currently on treatment]        Care Coordination  Interventions:  Yes, provided   Interventions Today    Flowsheet Row Most Recent Value  Chronic Disease   Chronic disease during today's visit Other  [spoke with client about client needs]  General Interventions   General Interventions Discussed/Reviewed General Interventions Discussed, Community Resources  Education Interventions   Education Provided Provided Education  Provided Verbal Education On [employer]  Mental Health Interventions   Mental Health Discussed/Reviewed Coping Strategies  Nutrition Interventions   Nutrition Discussed/Reviewed Nutrition Discussed  Pharmacy Interventions   Pharmacy Dicussed/Reviewed Pharmacy Topics Discussed  Safety Interventions   Safety  Discussed/Reviewed Fall Risk        Follow up plan: Follow up call scheduled for 09/10/23 at 3:30 PM     Encounter Outcome:  Patient Visit Completed    Lorna Few  MSW, LCSW [place] Licensed Clinical Social Worker Direct Dial:  [phone] Fax:  [phone] Website:  [email or web address]

## 2023-09-06 ENCOUNTER — Encounter (HOSPITAL_COMMUNITY): Payer: Self-pay

## 2023-09-06 ENCOUNTER — Telehealth (HOSPITAL_COMMUNITY): Payer: Self-pay | Admitting: Physician Assistant

## 2023-09-09 ENCOUNTER — Encounter: Payer: Self-pay | Admitting: Licensed Clinical Social Worker

## 2023-09-09 ENCOUNTER — Telehealth: Payer: Self-pay | Admitting: Licensed Clinical Social Worker

## 2023-09-13 ENCOUNTER — Telehealth: Payer: Self-pay | Admitting: *Deleted

## 2023-09-13 NOTE — Progress Notes (Unsigned)
 Complex Care Management Care Guide Note  09/13/2023 Name: Brendan Ochoa MRN: [id number] DOB: June 27, 1975  Brendan Ochoa is a 48 y.o. year old male who is a primary care patient of Lawrance Presume, MD and is actively engaged with the care management team. I reached out to [name] by phone today to assist with re-scheduling  with the Licensed Clinical Child psychotherapist.  Follow up plan: Unsuccessful telephone outreach attempt made. A HIPAA compliant phone message was left for the patient providing contact information and requesting a return call.  [name]  [facility]  [facility], [facility] Guide  Direct Dial: [phone]  Fax [phone]

## 2023-09-18 NOTE — Progress Notes (Signed)
 Complex Care Management Care Guide Note  09/18/2023 Name: Brendan Ochoa MRN: [id number] DOB: 1975-11-29  [name] is a 48 y.o. year old male who is a primary care patient of Lawrance Presume, MD and is actively engaged with the care management team. I reached out to [name] by phone today to assist with re-scheduling  with the Licensed Clinical Child psychotherapist.  Follow up plan: Unsuccessful telephone outreach attempt made. A HIPAA compliant phone message was left for the patient providing contact information and requesting a return call. No further outreach attempts will be made at this time. We have been unable to contact the patient to reschedule for complex care management services.   [name]  [facility]  [facility], [facility] Guide  Direct Dial: [phone]  Fax [phone]

## 2023-09-19 ENCOUNTER — Ambulatory Visit (HOSPITAL_COMMUNITY)
Admission: EM | Admit: 2023-09-19 | Discharge: 2023-09-19 | Disposition: A | Discharge status: Home / Self Care | Attending: Psychiatry | Admitting: Psychiatry

## 2023-09-19 DIAGNOSIS — Z765 Malingerer [conscious simulation]: Secondary | ICD-10-CM | POA: Insufficient documentation

## 2023-09-19 DIAGNOSIS — Z5900 Homelessness unspecified: Secondary | ICD-10-CM | POA: Insufficient documentation

## 2023-09-19 DIAGNOSIS — F1010 Alcohol abuse, uncomplicated: Secondary | ICD-10-CM | POA: Insufficient documentation

## 2023-09-19 NOTE — ED Provider Notes (Signed)
 Behavioral Health Urgent Care Medical Screening Exam  Patient Name: Brendan Ochoa MRN: [id number] Date of Evaluation: 09/20/23 Chief Complaint:  homelesssness and needing detox Diagnosis:  Final diagnoses:  Homelessness unspecified  Malingering  Alcohol  abuse    History of Present illness: Brendan Ochoa is a 48 y.o. male. With a history of MDD, alcohol  abuse.  Presented to [facility] anteriorly.  Per the patient he is looking for detox.  Review of patient records show that patient was just discharged a month ago with the same presentation.  When asked question patient would hesitate before answering when asked if he was homeless patient stated no but after a while he said he had homeless and needs somewhere to stay man.  Discussed with patient the need to follow up with the shelters.  Patient was also given resources the last time patient was here on 07/28/2023.  Patient did not follow up with any outpatient resources.  Patient does appear to be malingering for secondary gain due to homelessness.  This present moment patient does not show any sign of detox.  Patient is currently not followed by a psych provider at one point patient was taking psych medicine but stated he stopped taking them a couple months ago.  Is to face observation of patient, patient is alert and oriented x 4, speech is clear, maintain eye contact.  Patient very nonchalant when asked questions.  Patient denies SI, HI, AVH or paranoia.  Does not appear to be in any form of distress does not seem to be a risk to himself or others.  Patient eventually admitted that he is homeless and he is looking somewhere to stay long-term.  Writer discussed with patient that he needs to reach out to be shelters for housing assistance.  Patient endorsed alcohol  consumption.  However patient does not seem to be in any withdrawal or any distress.  Patient does appear to be lingering for secondary gain due to homelessness.  Writer gave patient  resources to outpatient detox program patient stated " I do not want that shit" , patient proceeded to say I am just ready to go giving my bags and stuff.  patient was advised to call 911 or return to the nearest ED should he experience suicidal thoughts homicidal ideation or hallucination.  At the time of this assessment patient does not pose a risk to himself or others does not show any sign of distress is not in withdrawal.  Recommend discharge for patient to follow up with outpatient resources provided to him.  Patient was given outpatient resources he stated I do not want that man.  Flowsheet Row ED from 09/19/2023 in [facility] ED from 07/28/2023 in [facility] ED from 07/27/2023 in [facility]  C-SSRS RISK CATEGORY No Risk No Risk No Risk       Psychiatric Specialty Exam  Presentation  General Appearance:Casual  Eye Contact:Good  Speech:Clear and Coherent  Speech Volume:Normal  Handedness:Right   Mood and Affect  Mood: Anxious  Affect: Congruent   Thought Process  Thought Processes: Linear  Descriptions of Associations:Intact  Orientation:Full (Time, Place and Person)  Thought Content:WDL  Diagnosis of Schizophrenia or Schizoaffective disorder in past: No   Hallucinations:None  Ideas of Reference:None  Suicidal Thoughts:No  Homicidal Thoughts:No   Sensorium  Memory: Immediate Fair  Judgment: Fair  Insight: Fair   Art therapist  Concentration: Fair  Attention Span: Fair  Recall: [employer] of  Knowledge: Fair  Language: Fair   Psychomotor Activity  Psychomotor Activity: Normal   Assets  Assets: Desire for Improvement; Housing   Sleep  Sleep: Fair  Number of hours:  6   Physical Exam: Physical Exam HENT:     Head: Normocephalic.     Nose: Nose normal.  Eyes:     Pupils: Pupils are equal, round, and reactive to light.   Cardiovascular:     Rate and Rhythm: Normal rate.  Pulmonary:     Effort: Pulmonary effort is normal.  Musculoskeletal:        General: Normal range of motion.     Cervical back: Normal range of motion.  Neurological:     General: No focal deficit present.     Mental Status: He is alert.  Psychiatric:        Mood and Affect: Mood normal.        Thought Content: Thought content normal.        Judgment: Judgment normal.    Review of Systems  Constitutional: Negative.   HENT: Negative.    Eyes: Negative.   Respiratory: Negative.    Cardiovascular: Negative.   Gastrointestinal: Negative.   Genitourinary: Negative.   Musculoskeletal: Negative.   Skin: Negative.   Neurological: Negative.   Psychiatric/Behavioral:  Positive for substance abuse. The patient is nervous/anxious.    There were no vitals taken for this visit. There is no height or weight on file to calculate BMI.  Musculoskeletal: Strength & Muscle Tone: within normal limits Gait & Station: normal Patient leans: N/A   [facility] MSE Discharge Disposition for Follow up and Recommendations: Based on my evaluation the patient does not appear to have an emergency medical condition and can be discharged with resources and follow up care in outpatient services for Substance Abuse Intensive Outpatient Program   Dorthea Gauze, NP 09/20/2023, 5:48 AM

## 2023-09-19 NOTE — Discharge Instructions (Signed)
 Follow-up with outpatient resources provided also follow up with the main shelter for housing assistance.

## 2023-09-19 NOTE — Progress Notes (Signed)
   09/19/23 1745  [facility] Triage Screening (Walk-ins at [facility] only)  How Did You Hear About Us ? Self  What Is the Reason for Your Visit/Call Today? Pt presents to [facility] voluntarily seeking alcohol  detox. Pt reports he had 6-8 beer two hours ago but no current withdrawal symptoms. Pt reports that he drinks approx. 15 beers daily. Pt reports he quit taking his psychiatric medications about 2 months ago. Pt reports using approx. $20 worth of cocaine . Pt reports symptoms of depression.  Pt denies SI, HI, and AVH.  How Long Has This Been Causing You Problems? > than 6 months  Have You Recently Had Any Thoughts About Hurting Yourself? No  Are You Planning to Commit Suicide/Harm Yourself At This time? No  Have you Recently Had Thoughts About Hurting Someone Marigene Shoulder? No  Are You Planning To Harm Someone At This Time? No  Explanation: Denies HI  Physical Abuse Denies  Verbal Abuse Denies  Sexual Abuse Denies  Exploitation of patient/patient's resources Denies  Self-Neglect Denies  Possible abuse reported to:  (n/a)  Are you currently experiencing any auditory, visual or other hallucinations? No  Have You Used Any Alcohol  or Drugs in the Past 24 Hours? Yes  What Did You Use and How Much? 6-8 beers  Do you have any current medical co-morbidities that require immediate attention? No  Clinician description of patient physical appearance/behavior: Pt was cooperative.  What Do You Feel Would Help You the Most Today? Alcohol  or Drug Use Treatment;Medication(s)  If access to [facility] was not available, would you have sought care in the Emergency Department? Yes  Determination of Need Urgent (48 hours)  Options For Referral Facility-Based Crisis  Determination of Need filed? Yes    Flowsheet Row ED from 09/19/2023 in [facility] ED from 07/28/2023 in [facility] ED from 07/27/2023 in [facility]  C-SSRS RISK CATEGORY No  Risk No Risk No Risk

## 2023-09-20 ENCOUNTER — Ambulatory Visit: Payer: Self-pay | Admitting: Internal Medicine

## 2024-01-06 ENCOUNTER — Ambulatory Visit: Payer: Self-pay

## 2024-01-06 NOTE — Telephone Encounter (Signed)
 FYI Only or Action Required?: FYI only for provider.  Patient was last seen in primary care on 07/25/2023 by Danton Jon HERO, PA-C.  Called Nurse Triage reporting broken wrist.  Symptoms began several months ago.  Interventions attempted: Nothing.  Symptoms are: unchanged.  Triage Disposition: See PCP When Office is Open (Within 3 weeks)  Patient/caregiver understands and will follow disposition?: Yes             Copied from CRM #[id number]. Topic: Clinical - Red Word Triage >> Jan 06, 2024  3:36 PM Carlatta H wrote: [facility] that prompted transfer to Nurse Triage: Broke left wrist 6 weeks ago//Constant severe pain Reason for Disposition  [1] After 2 weeks AND [2] still painful  Answer Assessment - Initial Assessment Questions 1. MECHANISM: How did the injury happen?     Fell at [employer] and broke the left wrist 2. ONSET: When did the injury happen? (e.g., minutes or hours ago)      6 weeks agp 3. APPEARANCE of INJURY: What does the injury look like?      Looks normal now 4. SEVERITY: Can you use your wrist normally? Can you move your wrist back and forth? Can you hold something in your hand?     Can't pick up anything 5. SIZE: For cuts, bruises, or swelling, ask: How large is it? (e.g., inches or centimeters; entire wrist)      no 6. PAIN: How bad is the pain? (Scale 0-10; or none, mild, moderate, severe)    Constant mild to moderate 7. TETANUS: For any breaks in the skin, ask: When was your last tetanus booster?     na 8. OTHER SYMPTOMS: Do you have any other symptoms?      no 9. PREGNANCY: Is there any chance you are pregnant? When was your last menstrual period?     na  Protocols used: Wrist Injury-A-AH

## 2024-01-10 ENCOUNTER — Ambulatory Visit (INDEPENDENT_AMBULATORY_CARE_PROVIDER_SITE_OTHER)

## 2024-01-10 ENCOUNTER — Encounter (HOSPITAL_COMMUNITY): Payer: Self-pay

## 2024-01-10 DIAGNOSIS — F1020 Alcohol dependence, uncomplicated: Secondary | ICD-10-CM

## 2024-01-10 DIAGNOSIS — F1994 Other psychoactive substance use, unspecified with psychoactive substance-induced mood disorder: Secondary | ICD-10-CM

## 2024-01-10 DIAGNOSIS — F1914 Other psychoactive substance abuse with psychoactive substance-induced mood disorder: Secondary | ICD-10-CM

## 2024-01-10 DIAGNOSIS — F1021 Alcohol dependence, in remission: Secondary | ICD-10-CM

## 2024-01-10 DIAGNOSIS — F1421 Cocaine dependence, in remission: Secondary | ICD-10-CM

## 2024-01-10 NOTE — Progress Notes (Addendum)
 THERAPIST PROGRESS NOTE  Session Time: 9:00 am to 10:00 am  Type of Therapy: Individual   Therapist Response/Interventions: psycho-education regarding the disease of addiction  Treatment Goals addressed: gather additional information to fill in gaps since CCA on 07-28-23.   Summary:  Brendan Ochoa presents today  stating [facility] be admitted to [facility].  He had a CCA on 07-28-23. Therapist inquires as to why Brendan Ochoa chose this particular time to come to [facility].  He explains he was at [facility] 20 years ago and has been to [facility]. He says neither of these programs helped him to stay sober. He reports he went 2 years without drinking many years ago.  He says he now has 3 months sober. Therapist inquires as to how he was able to do that. He says he meet a lady online from the [place] and he has never felt about any one the way he feels about her. He plans to go visit her around Christmas and says if she is the right one he may marry her.  Brendan Ochoa says this lady only has a couple of drinks when she drinks and can control her use.  He says he needs to be sober, as this lady says she will not put up with his excessive drinking. Brendan Ochoa reports the last use of cocaine  was on 09-19-23 and he used 20.00.   Brendan Ochoa says he has a medication appointment with Brendan Ochoa on next Wednesday, 01-15-24. Therapist explains he could not attend [facility] on Wednesday and also see Brendan Ochoa. Therapist explains we have a medication provider in [facility].  Brendan Ochoa says he really wants to see Brendan Ochoa as he saw him several years ago.  Therapist explains that if a pt is scheduled for med mgmt, [facility] schedules the pt on a different day. Brendan Ochoa agrees to return on next Friday, 01-17-24 to complete his IPRS Treatment Plan.   Brendan Ochoa says he attends [employer] some when he is sober but has never got a sponsor or done step work.   Brendan Ochoa says he has some depression and it has been worse in the past. He scores a 7 on the PHQ-9 and a  6 on the GAD-7.  He does not know if the depressive symptoms onset prior to his alcohol  use, starting at age 12.  Family History:  Brendan Ochoa says his maternal grandfather was an alcoholic. He says his brother is a functional alcoholic as he is a Marine scientist. Brendan Ochoa says his sister used to use cocaine  but now is an alcoholic, but she can control it.  Social History: Brendan Ochoa obtained a GED and went to [employer] and completed a Ecologist.  Brendan Ochoa has a hx of 3 DWI's. He says he just got the breathalyzer out of his car.  He is currently living with his mother. 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      CCA Substance Use Alcohol /Drug Use: Alcohol  / Drug Use Pain Medications: none Prescriptions: none Over the Counter: ibuprofen  History of alcohol  / drug use?: Yes Longest period of sobriety (when/how long): Per chart, 2 years sober approximately 10 years ago. After 6 months ago, stopped medications and started drinking again. Built up to drinking everyday. Negative Consequences of Use: Personal relationships, Legal Withdrawal Symptoms: None Substance #1 Name of Substance 1: Alcohol  1 - Age of First Use: 17 1 - Amount (size/oz): built up to a case per day and some liquor on top of that.  Not a big liquor drinker. Mainly beer 1 - Frequency: daily 1 - Duration: since age 89 1 - Last Use / Amount: 3 months ago. Wants October 13, 2023 to be his sobriety date since it was around that time when he last used. 1 - Method of Aquiring: legal 1- Route of Use: oral Substance #2 Name of Substance 2: cocaine  2 - Age of First Use: 27 2 - Amount (size/oz): started out small but started doing it daily (3 grams). 2 - Frequency: daily 2 - Duration: about 5-6  months ago started drinking and one month later used 1gram 2-Last Use; 09-19-23 2- Method of Aquiring: illicit 2-Route of Use:                     ASAM's:  Six Dimensions of Multidimensional Assessment  Dimension 1:  Acute  Intoxication and/or Withdrawal Potential:   Dimension 1:  Description of individual's past and current experiences of substance use and withdrawal: None.  Dimension 2:  Biomedical Conditions and Complications:   Dimension 2:  Description of patient's biomedical conditions and  complications: Chron's  Dimension 3:  Emotional, Behavioral, or Cognitive Conditions and Complications:  Dimension 3:  Description of emotional, behavioral, or cognitive conditions and complications: depression that has varied in severity. Does not know whether he was depressed before onset of drinking.  Dimension 4:  Readiness to Change:  Dimension 4:  Description of Readiness to Change criteria: willing to enter treatment. Not convinced he can maintain sobriety, but says he is motivated.  Dimension 5:  Relapse, Continued use, or Continued Problem Potential:  Dimension 5:  Relapse, continued use, or continued problem potential critiera description: Little recognition and understanding of substance use relapse skills  Dimension 6:  Recovery/Living Environment:  Dimension 6:  Recovery/Iiving environment criteria description: Currently living at [facility].  ASAM Severity Score: ASAM's Severity Rating Score: 9  ASAM Recommended Level of Treatment: ASAM Recommended Level of Treatment: Level II Intensive Outpatient Treatment   Substance use Disorder (SUD) Substance Use Disorder (SUD)  Checklist Symptoms of Substance Use: Continued use despite having a persistent/recurrent physical/psychological problem caused/exacerbated by use, Continued use despite persistent or recurrent social, interpersonal problems, caused or exacerbated by use, Evidence of tolerance, Evidence of withdrawal (Comment), Persistent desire or unsuccessful efforts to cut down or control use, Presence of craving or strong urge to use, Repeated use in physically hazardous situations, Substance(s) often taken in larger amounts or over longer times than was  intended  Recommendations for Services/Supports/Treatments: Recommendations for Services/Supports/Treatments Recommendations For Services/Supports/Treatments: CD-IOP Intensive Chemical Dependency Program  DSM5 Diagnoses: Patient Active Problem List   Diagnosis Date Noted   Alcohol  use disorder 07/28/2023   Moderate episode of recurrent major depressive disorder (HCC) 05/23/2022   Generalized anxiety disorder 05/23/2022   Sleep disturbances 05/23/2022   Alcohol  use disorder, severe, dependence (HCC)    Cocaine  use disorder, severe, dependence (HCC)    Cannabis use disorder, severe, dependence (HCC)    Alcohol  abuse with withdrawal (HCC) 04/27/2021   Acute encephalopathy    Hypokalemia    Tobacco abuse    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   Epididymal cyst 06/14/2020   Unintentional weight loss 06/14/2020   Abnormal chest x-ray 06/14/2020   Elevated blood pressure reading without diagnosis of hypertension 05/27/2020   Influenza vaccination declined 05/27/2020   AKI (acute kidney injury) (HCC) 01/01/2020   Vomiting 01/01/2020  Cocaine  abuse (HCC) 01/01/2020   Alcohol  abuse 01/01/2020   Ileitis 02/18/2018   HYPERGLYCEMIA 11/01/2009   Tobacco dependence 07/18/2007   SUBSTANCE ABUSE, MULTIPLE 07/18/2007   Allergic rhinitis 07/18/2007   Asthma 07/18/2007   GERD 07/18/2007   Crohn's disease (HCC) 07/18/2007   Progress Towards Goals: initial  Suicidal/Homicidal: denies  Plan: Return next Friday on 01-17-24 to complete  Medicaid treatment plan for [facility].   Diagnosis: F 10. 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   Collaboration of Care: N/A  Patient/Guardian was advised Release of Information must be obtained prior to any record release in order  to collaborate their care with an outside provider. Patient/Guardian was advised if they have not already done so to contact the registration department to sign all necessary forms in order for us  to release information regarding their care.   Consent: Patient/Guardian gives verbal consent for treatment and assignment of benefits for services provided during this visit. Patient/Guardian expressed understanding and agreed to proceed.   Brendan Eldena Dede,MS.  LMFT, LCAS Patient Centered Plan: Patient is on the following Treatment Plan(s):  Substance Use   Referrals to Alternative Service(s): Referred to Alternative Service(s):   Place:   Date:   Time:    Referred to Alternative Service(s):   Place:   Date:   Time:    Referred to Alternative Service(s):   Place:   Date:   Time:    Referred to Alternative Service(s):   Place:   Date:   Time:      Collaboration of Care: N/A  Patient/Guardian was advised Release of Information must be obtained prior to any record release in order to collaborate their care with an outside provider. Patient/Guardian was advised if they have not already done so to contact the registration department to sign all necessary forms in order for us  to release information regarding their care.   Consent: Patient/Guardian gives verbal consent for treatment and assignment of benefits for services provided during this visit. Patient/Guardian expressed understanding and agreed to proceed.   Brendan Simpler, MS, LMFT, LCAS

## 2024-01-15 ENCOUNTER — Ambulatory Visit (INDEPENDENT_AMBULATORY_CARE_PROVIDER_SITE_OTHER): Payer: MEDICAID | Admitting: Physician Assistant

## 2024-01-15 ENCOUNTER — Telehealth (HOSPITAL_COMMUNITY): Payer: Self-pay

## 2024-01-15 ENCOUNTER — Encounter (HOSPITAL_COMMUNITY): Payer: Self-pay | Admitting: Physician Assistant

## 2024-01-15 ENCOUNTER — Other Ambulatory Visit: Payer: Self-pay

## 2024-01-15 VITALS — BP 132/87 | HR 108 | Ht 71.0 in | Wt 173.8 lb

## 2024-01-15 DIAGNOSIS — F411 Generalized anxiety disorder: Secondary | ICD-10-CM

## 2024-01-15 DIAGNOSIS — F331 Major depressive disorder, recurrent, moderate: Secondary | ICD-10-CM

## 2024-01-15 DIAGNOSIS — F1020 Alcohol dependence, uncomplicated: Secondary | ICD-10-CM

## 2024-01-15 MED ORDER — NALTREXONE HCL 50 MG PO TABS
50.0000 mg | ORAL_TABLET | Freq: Every day | ORAL | 1 refills | Status: DC
  Filled 2024-01-15: qty 30, 30d supply, fill #0

## 2024-01-15 MED ORDER — BUPROPION HCL ER (XL) 150 MG PO TB24
150.0000 mg | ORAL_TABLET | Freq: Every day | ORAL | 1 refills | Status: AC
  Filled 2024-01-15: qty 30, 30d supply, fill #0

## 2024-01-15 NOTE — Progress Notes (Signed)
 [facility] MD/PA/NP OP Progress Note  01/15/2024 9:54 PM Brendan Ochoa  MRN:  [id number]  Chief Complaint:  Chief Complaint  Patient presents with   Follow-up   Medication Management   HPI:   Brendan Ochoa is a 48 year old male with a past psychiatric history significant for major depressive disorder (moderate episode, recurrent), generalized anxiety disorder, and alcohol  use disorder (severe, dependence) who presents to [facility] for follow-up and medication management.  Patient was last seen by this provider on 07/26/2023.  During his last encounter, patient was being managed on the following psychiatric medications:  Abilify  5 mg daily Sertraline  150 mg daily  Patient reports that he stopped presenting to his encounters after drinking again.  He reports that he drank for over 3 months stating that the amount he drank would vary.  He reports that in the beginning, he would drink 3-4 beers a day until he eventually started drinking the first thing after waking up.  He reports that he last drank a few nights ago after being 3 months sober.  He reports that he drank Saturday and Sunday when his brother was in town.  After a week of drinking, patient reports that he neglected his work, church, and prayer.  Patient reports that he has enrolled in the substance abuse intensive outpatient program and has been set up with a therapist.  Patient rates his depression a 6 or 7 out of 10 with 10 being most severe.  Patient endorses depressive episodes every day.  Patient endorses the following depressive symptoms: feelings of sadness, lack of motivation, decreased concentration, decreased energy, irritability, feelings of guilt/worthlessness, and hopelessness.  Patient endorses anxiety and rates his anxiety a 3-4 out of 10.  Patient's main stressor revolves around drinking.  He reports that he last took his medications roughly 6 months ago.  A PHQ-9 screen was  performed with the patient scoring an 18.  A GAD-7 screen was also performed with the patient scoring a 4.  Patient is alert and oriented x 4, calm, cooperative, and fully engaged in conversation during the encounter.  Patient describes his mood as numb stating that he feels as though he is just existing.  Patient exhibits depressed mood with appropriate affect.  Patient denies suicidal or homicidal ideations.  He further denies auditory or visual hallucinations and does not appear to be responding to internal/external stimuli.  Patient endorses hypersomnia and receives on average 10 to 14 hours of sleep a day.  Patient endorses increased appetite and eats on average 5-6 meals per day.  Patient states that he last drank this past weekend.  Patient endorses tobacco use and smokes on average of half pack per day.  Patient denies illicit drug use.  Visit Diagnosis:    ICD-10-CM   1. Alcohol  use disorder, severe, dependence (HCC)  F10.20 naltrexone  (DEPADE) 50 MG tablet    2. Generalized anxiety disorder  F41.1     3. Moderate episode of recurrent major depressive disorder (HCC)  F33.1 buPROPion  (WELLBUTRIN  XL) 150 MG 24 hr tablet      Past Psychiatric History:  Patient endorses a past history of bipolar disorder. Patient has also been diagnosed with major depressive disorder. Patient struggles with alcohol  abuse and has also struggled with drug abuse in the past .  Past Medical History:  Past Medical History:  Diagnosis Date   Asthma    Cocaine  abuse (HCC)    in past   Crohn disease (HCC) 2008  Depression    in past, not current as of 2019    ETOH abuse    history of alcohol  use   GERD (gastroesophageal reflux disease)     Past Surgical History:  Procedure Laterality Date   BIOPSY  10/15/2017   Procedure: BIOPSY;  Surgeon: Harvey Margo CROME, MD;  Location: [facility];  Service: Endoscopy;;  terminal ileum colon   COLONOSCOPY WITH PROPOFOL  N/A 10/15/2017   redundant colon, normal  rectum, external and internal hemorrhoids, quiescent crohn's. Pathology: inactive chronicn nonspecific ileitis   ESOPHAGOGASTRODUODENOSCOPY (EGD) WITH PROPOFOL  N/A 10/15/2017   normal esophagus, small hiatal hernia, gastritis, duodenitis   FINGER AMPUTATION Right 2012   cut finger table saw and surgically amputated    Family Psychiatric History:  Grandfather (maternal) - Bipolar disorder   Patient reports that he is unsure of anyone else with psychiatric issues and states that his mother's side of the family has a history of mental illness.   Family history of suicide: Great uncle (maternal)   Patient denies a family history of homicide   Substance abuse, patient states that 90% of of his cousins either abused alcohol  or drugs  Family History:  Family History  Problem Relation Age of Onset   Stomach cancer Maternal Grandfather    Colon cancer Neg Hx     Social History:  Social History   Socioeconomic History   Marital status: Single    Spouse name: Not on file   Number of children: Not on file   Years of education: Not on file   Highest education level: Not on file  Occupational History   Not on file  Tobacco Use   Smoking status: Every Day    Current packs/day: 0.50    Average packs/day: 0.5 packs/day for 15.0 years (7.5 ttl pk-yrs)    Types: Cigarettes   Smokeless tobacco: Never   Tobacco comments:    1/4 to 1/2 pack per day   Vaping Use   Vaping status: Never Used  Substance and Sexual Activity   Alcohol  use: Not Currently    Comment: alcoholic- last use 11/10/20   Drug use: Not Currently    Types: Cocaine , Marijuana, Methamphetamines    Comment: last cocaine  use 10/10/20.  last meth april 2022   Sexual activity: Not on file  Other Topics Concern   Not on file  Social History Narrative   Not on file   Social Drivers of Health   Financial Resource Strain: Not on file  Food Insecurity: Food Insecurity Present (08/12/2023)   Hunger Vital Sign    Worried About  Running Out of Food in the Last Year: Sometimes true    Ran Out of Food in the Last Year: Sometimes true  Transportation Needs: No Transportation Needs (07/28/2023)   PRAPARE - Administrator, Civil Service (Medical): No    Lack of Transportation (Non-Medical): No  Physical Activity: Not on file  Stress: Stress Concern Present (08/12/2023)   [employer] of Occupational Health - Occupational Stress Questionnaire    Feeling of Stress : To some extent  Social Connections: Not on file    Allergies: No Known Allergies  Metabolic Disorder Labs: Lab Results  Component Value Date   HGBA1C 5.6 07/25/2023   MPG 120 05/03/2022   MPG 105.41 12/01/2021   Lab Results  Component Value Date   PROLACTIN 4.0 05/03/2022   PROLACTIN 9.7 06/08/2021   Lab Results  Component Value Date   CHOL 184 07/27/2023  TRIG 544 (H) 07/27/2023   HDL 69 07/27/2023   CHOLHDL 2.7 07/27/2023   VLDL UNABLE TO CALCULATE IF TRIGLYCERIDE OVER 400 mg/dL 96/84/7974   LDLCALC UNABLE TO CALCULATE IF TRIGLYCERIDE OVER 400 mg/dL 96/84/7974   LDLCALC 75 05/03/2022   Lab Results  Component Value Date   TSH 2.054 07/27/2023   TSH 0.940 05/03/2022    Therapeutic Level Labs: No results found for: LITHIUM No results found for: VALPROATE No results found for: CBMZ  Current Medications: Current Outpatient Medications  Medication Sig Dispense Refill   buPROPion  (WELLBUTRIN  XL) 150 MG 24 hr tablet Take 1 tablet (150 mg total) by mouth daily. 30 tablet 1   ARIPiprazole  (ABILIFY ) 5 MG tablet Take 1 tablet (5 mg total) by mouth daily. (Patient not taking: Reported on 07/25/2023) 30 tablet 1   fluticasone  (FLONASE ) 50 MCG/ACT nasal spray Place 1 spray into both nostrils daily. 16 g 2   loratadine  (CLARITIN ) 10 MG tablet Take 1 tablet (10 mg total) by mouth daily. (Patient not taking: Reported on 07/25/2023) 30 tablet 1   Multiple Vitamin (MULTIVITAMIN WITH MINERALS) TABS tablet Take 1 tablet by mouth  daily.     naltrexone  (DEPADE) 50 MG tablet Take 1 tablet (50 mg total) by mouth daily. 30 tablet 1   nicotine  (NICODERM CQ  - DOSED IN MG/24 HOURS) 14 mg/24hr patch Place 1 patch (14 mg total) onto the skin daily.     sertraline  (ZOLOFT ) 100 MG tablet Take 100 mg by mouth daily. (Patient not taking: Reported on 07/28/2023)     No current facility-administered medications for this visit.     Musculoskeletal: Strength & Muscle Tone: within normal limits Gait & Station: normal Patient leans: N/A  Psychiatric Specialty Exam: Review of Systems  Psychiatric/Behavioral:  Positive for dysphoric mood and sleep disturbance. Negative for decreased concentration, hallucinations, self-injury and suicidal ideas. The patient is nervous/anxious. The patient is not hyperactive.     Blood pressure 132/87, pulse (!) 108, height 5' 11 (1.803 m), weight 173 lb 12.8 oz (78.8 kg), SpO2 96%.Body mass index is 24.24 kg/m.  General Appearance: Casual  Eye Contact:  Good  Speech:  Clear and Coherent and Normal Rate  Volume:  Normal  Mood:  Anxious and Depressed  Affect:  Appropriate  Thought Process:  Coherent, Goal Directed, and Descriptions of Associations: Intact  Orientation:  Full (Time, Place, and Person)  Thought Content: WDL   Suicidal Thoughts:  No  Homicidal Thoughts:  No  Memory:  Immediate;   Good Recent;   Good Remote;   Fair  Judgement:  Good  Insight:  Good  Psychomotor Activity:  Normal  Concentration:  Concentration: Good and Attention Span: Good  Recall:  Good  Fund of Knowledge: Good  Language: Good  Akathisia:  No  Handed:  Right  AIMS (if indicated): not done  Assets:  Communication Skills Desire for Improvement Social Support Transportation  ADL's:  Intact  Cognition: WNL  Sleep:  Fair   Screenings: AIMS    Flowsheet Row Clinical Support from 01/15/2024 in [facility]  AIMS Total Score 0   GAD-7    Flowsheet Row Clinical Support from  01/15/2024 in [facility] Video Visit from 07/26/2023 in [facility] Office Visit from 07/25/2023 in [facility] [place] [facility] [place] Video Visit from 06/13/2023 in [facility] Video Visit from 04/10/2023 in [facility]  Total GAD-7 Score 4 16 17 13 2    PHQ2-9    Flowsheet Row Clinical Support from 01/15/2024 in [facility] Coordination from 08/12/2023 in [facility] Coordination ED from 07/28/2023 in [facility] Video Visit from 07/26/2023 in [facility] Office Visit from 07/25/2023 in [facility] [place] [facility] [place]  PHQ-2 Total Score 6 2 0 6 6  PHQ-9 Total Score 18 7 0 23 21   Flowsheet Row Clinical Support from 01/15/2024 in [facility] ED from 09/19/2023 in [facility] ED from 07/28/2023 in [facility]  C-SSRS RISK CATEGORY No Risk No Risk No Risk     Assessment and Plan:   Brendan Ochoa is a 48 year old male with a past psychiatric history significant for major depressive disorder (moderate episode, recurrent), generalized anxiety disorder, and alcohol  use disorder (severe, dependence) who presents to [facility] for follow-up and medication management.  An aims assessment was performed with the patient scoring a 0.  Patient reports that he stopped showing up to his appointments due to picking up drinking again.  He reports that he drank for 3 months straight.  He reports that he started with drinking 3-4 beers a day until he eventually was drinking first thing in the morning.  Patient reports that he managed to maintain his sobriety for 3 months but recently  relapsed this past weekend once his brother came into town.  Patient reports that he has since enrolled into [facility] and has been set up with a therapist.  Patient reports that he has not been on his medications for the past 6 months.  10 struggling with his drinking, patient reports that he has been experiencing worsening depression.  He also endorses anxiety attributed to his drinking.  Patient was previously taking sertraline  and Abilify ; however, patient is interested in other options for managing his depression.  Provider recommended patient be placed on naltrexone  50 mg daily for the management of his alcohol  use disorder.  Patient was agreeable to recommendation.  Provider also recommended patient be placed on Wellbutrin  150 mg daily for the management of his depressive symptoms.  Patient was agreeable to recommendation.  Provider discussed side effects associated with use of Wellbutrin  and naltrexone  prior to the conclusion of the encounter.  Patient verbalized understanding.  Patient's medications to be e-prescribed to pharmacy of choice.  A [place] Suicide Severity Rating Scale was performed with the patient being considered no risk.  Patient denies suicidal ideations and is able to contract for safety at this time.   Collaboration of Care: Collaboration of Care: Medication Management AEB provider managing patient's psychiatric medications, Primary Care Provider AEB patient being seen by internal medicine, Psychiatrist AEB patient being followed by mental health provider at this facility, and Referral or follow-up with counselor/therapist AEB patient is enrolled in [facility].  Patient/Guardian was advised Release of Information must be obtained prior to any record release in order to collaborate their care with an outside provider. Patient/Guardian was advised if they have not already done so to contact the registration department to sign all necessary forms in order for us  to release information  regarding their care.   Consent: Patient/Guardian gives verbal consent for treatment and assignment of benefits for services provided during this visit. Patient/Guardian expressed understanding and agreed to  proceed.   1. Alcohol  use disorder, severe, dependence (HCC) (Primary)  - naltrexone  (DEPADE) 50 MG tablet; Take 1 tablet (50 mg total) by mouth daily.  Dispense: 30 tablet; Refill: 1  2. Generalized anxiety disorder  3. Moderate episode of recurrent major depressive disorder (HCC)  - buPROPion  (WELLBUTRIN  XL) 150 MG 24 hr tablet; Take 1 tablet (150 mg total) by mouth daily.  Dispense: 30 tablet; Refill: 1  Patient to follow-up in 5 weeks Provider spent a total of 23 minutes with the patient/reviewing patient's chart  Reginia FORBES Bolster, PA 01/15/2024, 9:54 PM

## 2024-01-15 NOTE — Telephone Encounter (Signed)
 Therapist calls Brendan Ochoa to remind him of his appointment on this Friday morning to come in and complete his treatment plan prior to beginning [facility] IOP.  Therapist obtains two identifiers to verify his identity and then provides him with the above information. He says he has the appointment on his calendar and has an appointment with Eddie for medication management today.  Darice Simpler, MS, LMFT, LCAS

## 2024-01-16 ENCOUNTER — Other Ambulatory Visit: Payer: Self-pay

## 2024-01-17 ENCOUNTER — Encounter (HOSPITAL_COMMUNITY): Payer: Self-pay

## 2024-01-17 ENCOUNTER — Ambulatory Visit (INDEPENDENT_AMBULATORY_CARE_PROVIDER_SITE_OTHER): Payer: MEDICAID

## 2024-01-17 DIAGNOSIS — F1994 Other psychoactive substance use, unspecified with psychoactive substance-induced mood disorder: Secondary | ICD-10-CM

## 2024-01-17 DIAGNOSIS — F1020 Alcohol dependence, uncomplicated: Secondary | ICD-10-CM

## 2024-01-17 DIAGNOSIS — F1421 Cocaine dependence, in remission: Secondary | ICD-10-CM

## 2024-01-17 NOTE — Progress Notes (Addendum)
 THERAPIST PROGRESS NOTE  Session Time:9:20  Type of Therapy: Individual   Therapist Response/Interventions: Psychoeducational, therapist discusses how addiction is a brain disease, rather than a moral failure.  Treatment Goals addressed: remain sober from drugs and alcohol   Summary: Summary: Raeqwon  presents today to start SA IOP. He apologizes and says he he drank last Wednesday when he got into an argument with his girlfriend. Fernandez explains and says he was watching his girlfriend from the [place] play poker online and she was blowing kisses and hearts to others.  He says he does not think she saw him in the waiting room . Barkley says he became jealous and called her and they got into an argument. Ara says his girlfriend plays Passenger transport manager for money.  Pilar reports drinking again yesterday and says he had 10 beers.  He apologizes to this therapist. Therapist informs him that he does not need to apologize as he is suffering from a brain disease.  Cherry says he wants to believe this but feels like he has a choice to drink or not drink and feels like a moral failure when he does.  He describes his drinking as falling into sin.  Therapist inquires as to what he will do on this weekend if he gets emotionally upset or has thoughts of using. Eaven says he has a friend who lived in an [address] with him in the past and he has called and spoken with him as he is sober and he would be a good person to call. We discusses 12 step meetings, which he says he attends about 3 times per week when he is sober.  Tyrece says he does not have a sponsor but he knows he needs one.  Boss is currently on leave from his job as he injured his wrist and has a brace on it. Ashlee says that [employer] prescribed him Naltrexone , however the [employer] for a month supply is 55$ and currently he cannot afford it. This therapist prints off Good Rx and Needy Meds cards so Khi can check to see if this brings the  price down.  Banjamin says he cannot start [facility] IOP today because his ride is waiting for him.  He did say Lytle mentioned he may qualify for Medicaid. Therapist takes Esias to the [employer] worker from DSS who is here today and she meets with him.  Cecile does meet ASAM criteria for SA IOP and he plans to start, Monday, 01-21-24.  Progress Towards Goals: initial  Suicidal/Homicidal: denies  Plan: Plans to start SA IOP on Monday, 01-21-24.  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 Patient/Guardian was advised if they have not already done so to contact the registration department to sign all necessary forms in order for us  to release information regarding their care.   Consent: Patient/Guardian gives verbal consent for treatment and assignment of benefits for services provided during this visit. Patient/Guardian expressed understanding and agreed to proceed.   Darice Truman Aceituno,MS.  LMFT, LCAS

## 2024-01-20 ENCOUNTER — Encounter (HOSPITAL_COMMUNITY): Payer: Self-pay | Admitting: Medical

## 2024-01-20 ENCOUNTER — Ambulatory Visit (INDEPENDENT_AMBULATORY_CARE_PROVIDER_SITE_OTHER): Payer: MEDICAID | Admitting: Medical

## 2024-01-20 ENCOUNTER — Telehealth: Payer: Self-pay | Admitting: Internal Medicine

## 2024-01-20 ENCOUNTER — Other Ambulatory Visit: Payer: Self-pay

## 2024-01-20 VITALS — BP 123/88 | HR 71 | Ht 71.0 in | Wt 144.6 lb

## 2024-01-20 DIAGNOSIS — G894 Chronic pain syndrome: Secondary | ICD-10-CM

## 2024-01-20 DIAGNOSIS — F1421 Cocaine dependence, in remission: Secondary | ICD-10-CM | POA: Diagnosis not present

## 2024-01-20 DIAGNOSIS — G479 Sleep disorder, unspecified: Secondary | ICD-10-CM

## 2024-01-20 DIAGNOSIS — F331 Major depressive disorder, recurrent, moderate: Secondary | ICD-10-CM

## 2024-01-20 DIAGNOSIS — F17210 Nicotine dependence, cigarettes, uncomplicated: Secondary | ICD-10-CM

## 2024-01-20 DIAGNOSIS — F411 Generalized anxiety disorder: Secondary | ICD-10-CM

## 2024-01-20 DIAGNOSIS — K50918 Crohn's disease, unspecified, with other complication: Secondary | ICD-10-CM

## 2024-01-20 DIAGNOSIS — F1020 Alcohol dependence, uncomplicated: Secondary | ICD-10-CM

## 2024-01-20 DIAGNOSIS — F10929 Alcohol use, unspecified with intoxication, unspecified: Secondary | ICD-10-CM

## 2024-01-20 DIAGNOSIS — F1994 Other psychoactive substance use, unspecified with psychoactive substance-induced mood disorder: Secondary | ICD-10-CM

## 2024-01-20 DIAGNOSIS — Z79899 Other long term (current) drug therapy: Secondary | ICD-10-CM

## 2024-01-20 MED ORDER — BACLOFEN 10 MG PO TABS
10.0000 mg | ORAL_TABLET | Freq: Three times a day (TID) | ORAL | 2 refills | Status: AC
  Filled 2024-01-20: qty 90, 30d supply, fill #0

## 2024-01-20 NOTE — Telephone Encounter (Signed)
 Confirmed appt for 9/9

## 2024-01-20 NOTE — Progress Notes (Signed)
 Program: [facility] IOP     Group Time: 9 a.m. to 12 p.m.      Type of Therapy: Process and Psychoeducational    Topic: The therapists check in with group members, assess for SI/HI/psychosis and overall level of functioning. The therapists inquire about sobriety date and number of community support meetings attended since last session.   The therapists introduce the new group member and the person visiting for today. The therapists discuss addiction as a brain disease, and specially discuss the role of dopamine as being implicated when people who suffer from addiction feel bored, nothing there dopamine receptors have dampen down and explain it takes about 12 months for the brains dopamine receptors to re-regulate.  Therapist show Dr. Elspeth Melemis video, Relapse Prevention. Dr. Kyle discusses the key to relapse prevention is to understand relapse happens gradually and builds momentum. Melemis notes the first stage of relapse is emotional which involves a person not thinking about using and being in denial. He note that poor self care is the essence of emotional relapse.  He then notes if one stays in this stage for too long, it transitions to the mental stage of relapse where addicts feel uncomfortable in their own skin. In this stage part of the mind wants to use while the other part does not want to use.  This stage minimizes past consequences. As thoughts of using grow stronger if they are not interrupted, the relapse then becomes physical.    Summary: Proctor shares with the group that he drank last night so his new sobriety date in today, 01-19-24. He says he was sober for 3 months until last Tuesday and then he drank after a 3 month sober period on Wednesday 01-14-24 after an argument with his girlfriend.  He says he drank with his brother last evening. He says his brother is an alcoholic but can control his use. Sabian says his brother is a Marine scientist and does not drink at work and it does not cause him  any problems at work. Arieon says he has been able to be sober for 6 months to one year but would go back to drinking and eventually build up to drinking all day every day. Demarkis says he does not think he had the brain disease of addiction when he was younger as he did not drink as much but thinks he developed it. Therapist discusses with him how most who have the brain disease of addiction start out using smaller amounts and build up to large amounts than were intended.   Progress Towards Goals: Finnigan reports his sobriety date as today, 01-19-24  UDS collected: Yes Results: Negative   AA/NA attended?:  Yes   Sponsor?: No   Elsie Maier, MA, LCSW, [facility], LCAS Darice Simpler, LMFT, LCAS 01/19/2024

## 2024-01-20 NOTE — Progress Notes (Signed)
 Psychiatric Initial Adult Assessment   Patient Identification: Brendan Ochoa MRN:  [id number] Date of Evaluation:01/20/2024  Referral Source: [facility] Chief Complaint:   Chief Complaint  Patient presents with   Establish Care   Addiction Problem   Depression   Anxiety   Trauma   Visit Diagnosis:    ICD-10-CM   1. Alcohol  use disorder, severe, dependence (HCC)  F10.20     2. Cocaine  use disorder, severe, in early remission (HCC)  F14.21     3. Substance or medication-induced depressive disorder (HCC)  F19.94     4. Generalized anxiety disorder  F41.1     5. Moderate episode of recurrent major depressive disorder (HCC)  F33.1     6. Long term current use of antipsychotic medication  Z79.899     7. Sleep disturbances  G47.9     8. Cigarette nicotine  dependence without complication  F17.210     9. Crohn's disease with other complication, unspecified gastrointestinal tract location (HCC)  K50.918     10. Chronic pain syndrome  G89.4     11. Acute alcoholic intoxication with complication (HCC)  F10.929       History of Present Illness:   Pt [facility]  He was seen by IOP COUNSELOR Karen Rudd,MS. LMFT, LCAS   01/10/2024  Session Time: 9:00 am to 10:00 am  Type of Therapy: Individual   Therapist Response/Interventions: psycho-education regarding the disease of addiction  Treatment Goals addressed: gather additional information to fill in gaps since CCA on 07-28-23. Summary:  Brendan Ochoa presents today  stating [facility] recommended he be admitted to [facility].  He had a CCA on 07-28-23. Therapist inquires as to why Brendan Ochoa chose this particular time to come to [facility] IOP.  He explains he was at [facility] 20 years ago and has been to [facility]. He says neither of these programs helped him to stay sober. He reports he went 2 years without drinking many years ago.  He says he now has 3 months sober. Therapist inquires as to how he was able to  do that. He says he meet a lady online from the [place] and he has never felt about any one the way he feels about her. He plans to go visit her around Christmas and says if she is the right one he may marry her.  Brendan Ochoa says this lady only has a couple of drinks when she drinks and can control her use.  He says he needs to be sober, as this lady says she will not put up with his excessive drinking. Brendan Ochoa reports the last use of cocaine  was on 09-19-23 and he used 20.00.  Brendan Ochoa says he has a medication appointment with Brendan Ochoa on next Wednesday, 01-15-24. Therapist explains he could not attend [facility] IOP on Wednesday and also see Brendan Ochoa. Therapist explains we have a medication provider in [facility].  Brendan Ochoa says he really wants to see Brendan Ochoa as he saw him several years ago.  Therapist explains that if a pt is scheduled for med mgmt, [facility] schedules the pt on a different day. Brendan Ochoa agrees to return on next Friday, 01-17-24 to complete his IPRS Treatment Plan.  Plan: Return next Friday on 01-17-24 to complete  Medicaid treatment plan for SAIOP.   Diagnosis: F 10. 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  AND 01/17/2024 : Summary: Summary: Brendan Ochoa  presents today to start SA IOP. He apologizes and says he he drank last Wednesday when he got into an argument with his girlfriend. Brendan Ochoa explains and says he was watching his girlfriend from the [place] play poker online and she was blowing kisses and hearts to others.  He says he does not think she saw him in the waiting room . Brendan Ochoa says he became jealous and called her and they got into an argument. Brendan Ochoa says his girlfriend plays Passenger transport manager for money.  Brendan Ochoa is currently on leave from his job as he injured his wrist and has a brace on it. [name] says that [employer] prescribed him Naltrexone , however the [employer] for a month supply is 55$ and currently he cannot afford it. This therapist prints off Good Rx and Needy Meds cards so Brendan Ochoa can check to see if this brings the price down.   Brendan Ochoa says he cannot start [facility] IOP today because his ride is waiting for him.  He did say Brendan Ochoa mentioned he may qualify for Medicaid. Therapist takes Brendan Ochoa to the [employer] worker from DSS who is here today and she meets with him.   Brendan Ochoa does meet ASAM criteria for SA IOP and he plans to start, Monday, 01-21-24.(Actually 01/20/24)  Progress Towards Goals: initial  Suicidal/Homicidal: denies  Plan: Plans to start SA IOP on Monday, 01-21-24.   In speaking with him today, he says he is ready to quit drinking alcohol  He is in process of getting Medicaid to help with medications and treatment.  Associated Signs/Symptoms: ASAM's:  Six Dimensions of Multidimensional Assessment Dimension 1:  Acute Intoxication and/or Withdrawal Potential:   Dimension 1:  Description of individual's past and current experiences of substance use and withdrawal: None.  Dimension 2:  Biomedical Conditions and Complications:   Dimension 2:  Description of patient's biomedical conditions and  complications: Chron's  Dimension 3:  Emotional, Behavioral, or Cognitive Conditions and Complications:  Dimension 3:  Description of emotional, behavioral, or cognitive conditions and complications: depression that has varied in severity. Does not know whether he was depressed before onset of drinking.  Dimension 4:  Readiness to Change:  Dimension 4:  Description of Readiness to Change criteria: willing to enter treatment. Not convinced he can maintain sobriety, but says he is motivated.  Dimension 5:  Relapse, Continued use, or Continued Problem Potential:  Dimension 5:  Relapse, continued use, or continued problem potential critiera description: Little recognition and understanding of substance use relapse skills  Dimension 6:  Recovery/Living  Environment:  Dimension 6:  Recovery/Iiving environment criteria description: Currently living at [facility].  ASAM Severity Score: ASAM's Severity Rating Score: 9  ASAM Recommended Level of Treatment: ASAM Recommended Level of Treatment: Level II Intensive Outpatient Treatment    Substance use Disorder (SUD) Substance Use Disorder (SUD)  Checklist Symptoms of Substance Use: Continued use despite having a persistent/recurrent physical/psychological problem caused/exacerbated by use, Continued use despite persistent or recurrent social, interpersonal problems, caused or exacerbated by use, Evidence of tolerance, Evidence of withdrawal (Comment), Persistent desire or unsuccessful efforts to cut down or control use, Presence of craving or strong urge to use, Repeated use in physically hazardous situations, Substance(s) often taken in larger amounts or over longer times than was intended  Depression Sleep (too much or little); Worthlessness; Hopelessness; Tearfulness; Difficulty Concentrating; Increase/decrease in appetite  Mania  None  Anxiety  Difficulty concentrating;  Worrying  Past Medical History:  Past  Medical History:  Diagnosis Date   Asthma    Cocaine  abuse (HCC)    in past   Crohn disease (HCC) 2008   Depression    in past, not current as of 2019    ETOH abuse    history of alcohol  use   GERD (gastroesophageal reflux disease)     Past Surgical History:  Procedure Laterality Date   BIOPSY  10/15/2017   Procedure: BIOPSY;  Surgeon: Harvey Margo CROME, MD;  Location: [facility];  Service: Endoscopy;;  terminal ileum colon   COLONOSCOPY WITH PROPOFOL  N/A 10/15/2017   redundant colon, normal rectum, external and internal hemorrhoids, quiescent crohn's. Pathology: inactive chronicn nonspecific ileitis   ESOPHAGOGASTRODUODENOSCOPY (EGD) WITH PROPOFOL  N/A 10/15/2017   normal esophagus, small hiatal hernia, gastritis, duodenitis   FINGER AMPUTATION Right 2012   cut finger table  saw and surgically amputated   PTSD Symptoms: Had a traumatic exposure:   Denies  Past Psychiatric History:  Fellowship [employer] 20 years ago and has been to [facility].   12 step meetings   Previous Psychotropic Medications: Yes   Substance Abuse History in the last 12 months:  Yes.   CCA Substance Use Alcohol /Drug Use: Alcohol  / Drug Use Pain Medications: none Prescriptions: none Over the Counter: ibuprofen  History of alcohol  / drug use?: Yes Longest period of sobriety (when/how long): Per chart, 2 years sober approximately 10 years ago. After 6 months ago, stopped medications and started drinking again. Built up to drinking everyday. Negative Consequences of Use: Personal relationships, Legal Withdrawal Symptoms: None Substance #1 Name of Substance 1: Alcohol  1 - Age of First Use: 17 1 - Amount (size/oz): built up to a case per day and some liquor on top of that.  Not a big liquor drinker. Mainly beer 1 - Frequency: daily 1 - Duration: since age 6 1 - Last Use / Amount: 3 months ago. Wants October 13, 2023 to be his sobriety date since it was around that time when he last used. 1 - Method of Aquiring: legal 1- Route of Use: oral Substance #2 Name of Substance 2: cocaine  2 - Age of First Use: 27 2 - Amount (size/oz): started out small but started doing it daily (3 grams). 2 - Frequency: daily 2 - Duration: about 5-6  months ago started drinking and one month later used 1gram 2-Last Use; 09-19-23 2- Method of Aquiring: illicit 2-Route of Use:    Family History:  Family History  Problem Relation Age of Onset   Stomach cancer Maternal Grandfather    Colon cancer Neg Hx    Family Psychiatric History maternal grandfather history of bipolar and maternal great uncle completed suicide  Allergies:  No Known Allergies  Current Medications: Current Outpatient Medications  Medication Sig Dispense Refill   baclofen  (LIORESAL ) 10 MG tablet Take 1 tablet (10 mg total) by mouth 3 (three)  times daily. 90 tablet 2   albuterol  (VENTOLIN  HFA) 108 (90 Base) MCG/ACT inhaler Inhale 2 puffs into the lungs every 6 (six) hours as needed for wheezing or shortness of breath.     buPROPion  (WELLBUTRIN  XL) 150 MG 24 hr tablet Take 1 tablet (150 mg total) by mouth daily. 30 tablet 1   No current facility-administered medications for this visit.   Social History:   Social History   Socioeconomic History   Marital status: Single    Spouse name: his girlfriend from the [place]    Number of children: None  Highest education level: HS/12 years  Occupational History   on leave from his job as he injured his wrist and has a brace on it.   Tobacco Use   Smoking status: Every Day    Current packs/day: 0.50    Average packs/day: 0.5 packs/day for 15.0 years (7.5 ttl pk-yrs)    Types: Cigarettes   Smokeless tobacco: Never   Tobacco comments:    1/4 to 1/2 pack per day   Vaping Use   Vaping status: Never Used  Substance and Sexual Activity   Alcohol  use: [facility] admission 01/24/2024       Drug use: See SA hx    Types: Cocaine , Marijuana, Methamphetamines       Sexual activity: Not on file  Other Topics Concern   See Additional SH  Social History Narrative   Currently living at [employer] [facility]  Erice says his girlfriend plays Passenger transport manager for money.  Zeev says he has a friend who lived in an [address] with him in the past and he has called and spoken with him as he is sober and he would be a good person to call.    Social Drivers of Corporate investment banker Strain: Not on file  Food Insecurity: Food Insecurity Present (01/22/2024)   Hunger Vital Sign    Worried About Running Out of Food in the Last Year: Sometimes true    Ran Out of Food in the Last Year: Sometimes true  Transportation Needs: No Transportation Needs (01/22/2024)   PRAPARE - Administrator, Civil Service (Medical): Yes    Lack of Transportation (Non-Medical): Yes  Physical Activity: No   Stress: Stress Concern Present (08/12/2023)   [employer] of Occupational Health - Occupational Stress Questionnaire    Feeling of Stress : To some extent  Social Connections: Not on file     Additional Social History: Religion/Spirituality    Are You A Religious Person? Yes -- -- -- -- Yes Taken on 01/22/24 1036  How Might This Affect Treatment? None. -- -- -- -- None. Taken on 01/22/24 1036  Leisure / Recreation    Do You Have Hobbies? Yes -- -- -- -- Yes Taken on 01/22/24 1036  Leisure and [employer], fishing. -- -- -- -- Playing golf, fishing. Taken on 01/22/24 1036  Exercise/Diet    Do You Exercise? No        Metabolic Disorder Labs: Lab Results  Component Value Date   HGBA1C 5.6 01/22/2024   MPG 114.02 01/22/2024   MPG 120 05/03/2022   Lab Results  Component Value Date   PROLACTIN 4.0 05/03/2022   PROLACTIN 9.7 06/08/2021   Lab Results  Component Value Date   CHOL 203 (H) 01/22/2024   TRIG 266 (H) 01/22/2024   HDL 80 01/22/2024   CHOLHDL 2.5 01/22/2024   VLDL 53 (H) 01/22/2024   LDLCALC 70 01/22/2024   LDLCALC UNABLE TO CALCULATE IF TRIGLYCERIDE OVER 400 mg/dL 96/84/7974   Lab Results  Component Value Date   TSH 1.014 01/22/2024    Therapeutic Level Labs:NA  Musculoskeletal: Strength & Muscle Tone: within normal limits Gait & Station: normal Patient leans: N/A   Psychiatric Specialty Exam: Review of Systems  Psychiatric/Behavioral:  Positive for dysphoric mood and sleep disturbance. Negative for decreased concentration, hallucinations, self-injury and suicidal ideas. The patient is nervous/anxious. The patient is not hyperactiv   Eye Contact:  Good  Speech:  Clear and Coherent and Normal Rate  Volume:  Normal  Mood:  Euthymic  Affect:  Congruent  Thought Process:  Coherent, Goal Directed, and Descriptions of Associations: Intact  Orientation:  Full (Time, Place, and Person)  Thought Content:  WDL  Suicidal Thoughts:  No   Homicidal Thoughts:  No  Memory:  Negative  Judgement:  Impaired  Insight:  Lacking  Psychomotor Activity:  Normal  Concentration:  Concentration: Good and Attention Span: Good  Recall:  Negative  Fund of Knowledge:WDL  Language: Good  Akathisia:  NA  Handed:  Right  AIMS (if indicated):  NA  Assets:  Desire for Improvement Financial Resources/Insurance Housing Resilience Social Support Vocational/Educational  ADL's:  Impaired  Cognition: Impaired,  Severe Alcohol  use  Sleep:  Fair   Screenings: AIMS    Flowsheet Row Clinical Support from 01/15/2024 in [facility]  AIMS Total Score 0   GAD-7    Flowsheet Row Clinical Support from 01/15/2024 in [facility] Video Visit from 07/26/2023 in [facility] Office Visit from 07/25/2023 in [facility] [place] - [facility] Dept Of [facility]. [facility] Video Visit from 06/13/2023 in [facility] Video Visit from 04/10/2023 in [facility]  Total GAD-7 Score 4 16 17 13 2    PHQ2-9    Flowsheet Row Clinical Support from 01/15/2024 in [facility] Care Coordination from 08/12/2023 in [facility] Community Care Coordination ED from 07/28/2023 in [facility] Video Visit from 07/26/2023 in [facility] Office Visit from 07/25/2023 in [facility] Health Comm Health [place] - A Dept Of [place]  PHQ-2 Total Score 6 2 0 6 6  PHQ-9 Total Score 18 7 0 23 21   Flowsheet Row Clinical Support from 01/15/2024 in [facility] ED from 09/19/2023 in [facility] ED from 07/28/2023 in [facility]  C-SSRS RISK CATEGORY No Risk No Risk No Risk    Assessment and Plan:  AUD Dependence severe Cocaine  use disorder  severe Bipolar Disorder Substance induced mood disorder Long term use antipsychotic medication  Treatment Plan/Recommendations:  Plan of Care: SUDs/Core issues [facility] CDIOP see Counselor's individualized treatment plan  Laboratory:UDS per protocol  Psychotherapy: CD IOP Group,Individual and Family  Medications: See list He wants to try Baclofen  and hold off on Naltrexone    Routine PRN Medications: None from IOP  Consultations: NA  Safety Concerns: RISK ASSESSMENT -Negative  Other:  Anatomy and Biology of addiction/Addicted Brain reviewed with [employer] (Pictures of Anatomy and Function of Human brain along with  Pictures of Pet Scans Of Addicted Brains and You Tube video(Baclofen  reduces cravings)   Carlin Emmer, PA-C 01/20/2024 11:00am  ADDENDUM 01/22/2024   Time: 9:00 am to 9:10 am   Front desk staff walks Cordel to the CD IOP door and asks this therapist to step out of group.  We go into a private room. Arman starts apologizing saying he cannot stop drinking.  Therapists asks him to elaborate on what happened since Monday's SA IOP group.  [name] says he just cannot stop drinking. He said he started drinking after group on Monday and has not stopped drinking. Therapist explains he would benefit from detox services and walks [name].  Therapist discusses residential services as Linden is struggling with trying to stop his use.  He says he is going to ask the staff to refer  him. This therapist explains he can call after he finishes detox if he wishes to return to SA IOP.   Darice Simpler, MS, LMFT, LCAS  Per Counsler pt is to be dischareged to a Higher level of care after [place] tox Severe alcohol  depenence unable to abstain to participate in OP Program (CDIOP) Referred to higher level of care (Residential)  Carlin Emmer, PA-C 9/10/202510:28 AM

## 2024-01-21 ENCOUNTER — Ambulatory Visit: Payer: MEDICAID | Attending: Internal Medicine | Admitting: Internal Medicine

## 2024-01-21 VITALS — BP 110/68 | HR 105 | Temp 98.7°F | Ht 71.0 in | Wt 170.6 lb

## 2024-01-21 DIAGNOSIS — Z2821 Immunization not carried out because of patient refusal: Secondary | ICD-10-CM | POA: Diagnosis not present

## 2024-01-21 DIAGNOSIS — S62102G Fracture of unspecified carpal bone, left wrist, subsequent encounter for fracture with delayed healing: Secondary | ICD-10-CM

## 2024-01-21 DIAGNOSIS — F1020 Alcohol dependence, uncomplicated: Secondary | ICD-10-CM | POA: Diagnosis not present

## 2024-01-21 DIAGNOSIS — F17200 Nicotine dependence, unspecified, uncomplicated: Secondary | ICD-10-CM | POA: Diagnosis not present

## 2024-01-21 NOTE — Progress Notes (Unsigned)
 Patient ID: Brendan Ochoa, male    DOB: October 14, 1975  MRN: [id number]  CC: follow-up broken left wrist   Subjective: Brendan Ochoa is a 48 y.o. male who presents for delayed healing of L wrist pain 2/2 to wrist fracture His concerns today include:  Patient with history of polysubstance abuse (cocaine  and EtOH listed on the chart), Crohn's disease, GERD, tobacco dependence, asthma, GAD, MDD   Pt just got medicaid last week. Was referred back here w/ PCP today by his behavioral health program.  L wrist fracture/pain: The patient [place] and was evaluated at [facility] ED, where imaging revealed a carpal fracture. He was prescribed 5 hydrocodone tablets at that time which mildly helped. Initially, he was unable to move his fingers, though movement has since improved. He now reports intermittent L wrist pain without radiation to the hand or arm and wears a wrist brace daily even while sleeping, as the pain without the brace can be severe and may occasionally wake him at night. The pain worsens with certain wrist movements or opening and closing his hand. Tylenol  and ibuprofen  provide little to no relief. He may have paresthesias at the base of the dorsum of his left hand without radiation up his arm or down to his fingers. He also notes occasionally dropping objects due to the pain but denies weakness or numbness.  XR Wrist 3+ Views Left 11/26/2023 at [facility]:  - Small osseous fragment is seen at the dorsal wrist on the lateral projection without a clear donor site, possibly arising from the triquetrum or capitate. There is mild surrounding soft tissue swelling. No dislocation. Joint spaces are preserved.  MDD: Recently started IOP for depression and substance use disorder. Started Wellbutrin  3 days for smoking and depression. Feels like cigs dont taste as good since starting this.  AUD: He is attempting to quite by taking baclofen . Last alcoholic drink  this past weekend. Typically drinks 12 packs daily. Last drink was last night.   TUD: Smokes 1/2 ppd for the last 20 years off and on. He is ready to quit and hopeful Wellbutrin  will help decrease cravings.  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     Current Outpatient Medications on File Prior to Visit  Medication Sig Dispense Refill  baclofen  (LIORESAL ) 10 MG tablet Take 1 tablet (10 mg total) by mouth 3 (three) times daily. 90 tablet 2   buPROPion  (WELLBUTRIN  XL) 150 MG 24 hr tablet Take 1 tablet (150 mg total) by mouth daily. 30 tablet 1   No current facility-administered medications on file prior to visit.    No Known Allergies  Social History   Socioeconomic History   Marital status: Single    Spouse name: Not on file    Number of children: Not on file   Years of education: Not on file   Highest education level: Not on file  Occupational History   Not on file  Tobacco Use   Smoking status: Every Day    Current packs/day: 0.50    Average packs/day: 0.5 packs/day for 15.0 years (7.5 ttl pk-yrs)    Types: Cigarettes   Smokeless tobacco: Never   Tobacco comments:    1/4 to 1/2 pack per day   Vaping Use   Vaping status: Never Used  Substance and Sexual Activity   Alcohol  use: Not Currently    Comment: alcoholic- last use 11/10/20   Drug use: Not Currently    Types: Cocaine , Marijuana, Methamphetamines    Comment: last cocaine  use 10/10/20.  last meth april 2022   Sexual activity: Not on file  Other Topics Concern   Not on file  Social History Narrative   Not on file   Social Drivers of Health   Financial Resource Strain: Not on file  Food Insecurity: Food Insecurity Present (08/12/2023)   Hunger Vital Sign    Worried About Running Out of Food in the Last Year: Sometimes true    Ran Out of Food in the Last Year: Sometimes true  Transportation Needs: No Transportation Needs (07/28/2023)   PRAPARE - Administrator, Civil Service (Medical): No    Lack of Transportation (Non-Medical): No  Physical Activity: Not on file  Stress: Stress Concern Present (08/12/2023)   [employer] of Occupational Health - Occupational Stress Questionnaire    Feeling of Stress : To some extent  Social Connections: Not on file  Intimate Partner Violence: Not At Risk (11/26/2023)   Received from [facility]   HITS    Over the last 12 months how often did your partner physically hurt you?: Never    Over the last 12 months how often did your partner insult you or talk down to you?: Never    Over the last 12 months how often did your partner threaten you with physical harm?: Never    Over the last 12 months how often did your partner scream or curse at you?: Never    Family History  Problem Relation  Age of Onset   Stomach cancer Maternal Grandfather    Colon cancer Neg Hx     Past Surgical History:  Procedure Laterality Date   BIOPSY  10/15/2017   Procedure: BIOPSY;  Surgeon: Harvey Margo CROME, MD;  Location: [facility];  Service: Endoscopy;;  terminal ileum colon   COLONOSCOPY WITH PROPOFOL  N/A 10/15/2017   redundant colon, normal rectum, external and internal hemorrhoids, quiescent crohn's. Pathology: inactive chronicn nonspecific ileitis   ESOPHAGOGASTRODUODENOSCOPY (EGD) WITH PROPOFOL  N/A 10/15/2017   normal esophagus, small hiatal hernia, gastritis, duodenitis   FINGER AMPUTATION Right 2012   cut finger table saw and surgically amputated    ROS: Review of Systems Negative except as stated above  PHYSICAL EXAM: BP 110/68 (BP Location: Right Arm, Patient Position: Sitting)  Pulse (!) 105   Temp 98.7 F (37.1 C) (Oral)   Ht 5' 11 (1.803 m)   Wt 77.4 kg   SpO2 97%   BMI 23.79 kg/m   Physical Exam  General: alert, well appearing, malodorous white male in no acute distress Musculoskeletal - L wrist: Pt removed wrist brace for evaluation. Pain has limited the ROM. Pt reports TTP of the wrist posteriorly and anteriorly w/out paresthesias. Grip strength limited due to pain, 3/5 strength.      Latest Ref Rng & Units 07/27/2023   11:40 PM 05/03/2022    2:26 PM 12/01/2021   10:45 PM  CMP  Glucose 70 - 99 mg/dL 91  890  94   BUN 6 - 20 mg/dL 13  13  16    Creatinine 0.61 - 1.24 mg/dL 9.20  9.05  9.12   Sodium 135 - 145 mmol/L 139  136  140   Potassium 3.5 - 5.1 mmol/L 3.8  3.9  3.8   Chloride 98 - 111 mmol/L 101  102  106   CO2 22 - 32 mmol/L 23  22  23    Calcium 8.9 - 10.3 mg/dL 9.1  9.1  8.8   Total Protein 6.5 - 8.1 g/dL 6.2  8.1  6.8   Total Bilirubin 0.0 - 1.2 mg/dL 0.5  0.4  0.6   Alkaline Phos 38 - 126 U/L 63  74  68   AST 15 - 41 U/L 44  22  26   ALT 0 - 44 U/L 27  14  22     Lipid Panel     Component Value Date/Time   CHOL 184 07/27/2023 2340   TRIG 544  (H) 07/27/2023 2340   HDL 69 07/27/2023 2340   CHOLHDL 2.7 07/27/2023 2340   VLDL UNABLE TO CALCULATE IF TRIGLYCERIDE OVER 400 mg/dL 96/84/7974 7659   LDLCALC UNABLE TO CALCULATE IF TRIGLYCERIDE OVER 400 mg/dL 96/84/7974 7659   LDLDIRECT 65 07/27/2023 2340    CBC    Component Value Date/Time   WBC 5.4 07/27/2023 2340   RBC 4.79 07/27/2023 2340   HGB 14.9 07/27/2023 2340   HCT 43.0 07/27/2023 2340   PLT 242 07/27/2023 2340   MCV 89.8 07/27/2023 2340   MCH 31.1 07/27/2023 2340   MCHC 34.7 07/27/2023 2340   RDW 13.2 07/27/2023 2340   LYMPHSABS 2.8 07/27/2023 2340   MONOABS 0.4 07/27/2023 2340   EOSABS 0.1 07/27/2023 2340   BASOSABS 0.0 07/27/2023 2340    ASSESSMENT AND PLAN:  Assessment and Plan  1. Closed fracture of left wrist with delayed healing, subsequent encounter (Primary) Patient has persistent pain of the left wrist 2/2 to L wrist fracture with constant need to use a wrist brace. His symptoms elicits further evaluation through orthopedic surgery.  - Ambulatory referral to Orthopedic Surgery - Continue OTC pain meds PRN  2. Alcohol  use disorder, severe, dependence (HCC) Discussed the benefits of naltrexone  oral or injection form for decreasing alcohol  cravings and advised to consider this as an option. Advised patient to continue with IOP and baclofen  for now. - Continue Baclofen  as prescribed by his mental health provider - Continue with IOP   3. Influenza vaccination declined  Patient was given the opportunity to ask questions.  Patient verbalized understanding of the plan and was able to repeat key elements of the plan.    Orders Placed This Encounter  Procedures   Ambulatory referral to Orthopedic Surgery     Requested Prescriptions  No prescriptions requested or ordered in this encounter    No follow-ups on file.  This is a Psychologist, occupational Note.  The care of the patient was discussed with Dr. Vicci and the assessment and plan formulated with  her assistance.  Please see her attestation of this encounter.  Charlene Duwaine PARAS, Medical Student 01/21/2024, 5:13 PM

## 2024-01-21 NOTE — Patient Instructions (Addendum)
 I have sent a referral to orthopedics to evaluate you further for your left wrist pain, look out for a call from them.

## 2024-01-22 ENCOUNTER — Ambulatory Visit (HOSPITAL_COMMUNITY)
Admission: EM | Admit: 2024-01-22 | Discharge: 2024-01-22 | Disposition: A | Discharge status: Other Acute Inpatient Hospital | Payer: MEDICAID | Attending: Family Medicine | Admitting: Family Medicine

## 2024-01-22 ENCOUNTER — Ambulatory Visit (HOSPITAL_COMMUNITY): Payer: Self-pay

## 2024-01-22 ENCOUNTER — Encounter: Payer: Self-pay | Admitting: Internal Medicine

## 2024-01-22 ENCOUNTER — Other Ambulatory Visit (HOSPITAL_COMMUNITY)
Admission: EM | Admit: 2024-01-22 | Discharge: 2024-01-24 | Disposition: A | Discharge status: Home / Self Care | Payer: MEDICAID | Attending: Psychiatry | Admitting: Psychiatry

## 2024-01-22 DIAGNOSIS — Z5901 Sheltered homelessness: Secondary | ICD-10-CM | POA: Insufficient documentation

## 2024-01-22 DIAGNOSIS — F17210 Nicotine dependence, cigarettes, uncomplicated: Secondary | ICD-10-CM | POA: Diagnosis not present

## 2024-01-22 DIAGNOSIS — F332 Major depressive disorder, recurrent severe without psychotic features: Secondary | ICD-10-CM | POA: Insufficient documentation

## 2024-01-22 DIAGNOSIS — F1421 Cocaine dependence, in remission: Secondary | ICD-10-CM | POA: Diagnosis not present

## 2024-01-22 DIAGNOSIS — F1020 Alcohol dependence, uncomplicated: Secondary | ICD-10-CM | POA: Diagnosis not present

## 2024-01-22 DIAGNOSIS — F411 Generalized anxiety disorder: Secondary | ICD-10-CM | POA: Insufficient documentation

## 2024-01-22 DIAGNOSIS — Y908 Blood alcohol level of 240 mg/100 ml or more: Secondary | ICD-10-CM | POA: Insufficient documentation

## 2024-01-22 DIAGNOSIS — Z560 Unemployment, unspecified: Secondary | ICD-10-CM | POA: Insufficient documentation

## 2024-01-22 DIAGNOSIS — Z79899 Other long term (current) drug therapy: Secondary | ICD-10-CM | POA: Insufficient documentation

## 2024-01-22 LAB — LIPID PANEL
Cholesterol: 203 mg/dL — ABNORMAL HIGH (ref 0–200)
HDL: 80 mg/dL (ref >40)
LDL Cholesterol: 70 mg/dL (ref 0–99)
Total CHOL/HDL Ratio: 2.5 ratio
Triglycerides: 266 mg/dL — ABNORMAL HIGH (ref <150)
VLDL: 53 mg/dL — ABNORMAL HIGH (ref 0–40)

## 2024-01-22 LAB — COMPREHENSIVE METABOLIC PANEL WITH GFR
ALT: 23 U/L (ref 0–44)
AST: 25 U/L (ref 15–41)
Albumin: 3.7 g/dL (ref 3.5–5.0)
Alkaline Phosphatase: 49 U/L (ref 38–126)
Anion gap: 11 (ref 5–15)
BUN: 14 mg/dL (ref 6–20)
CO2: 22 mmol/L (ref 22–32)
Calcium: 9.1 mg/dL (ref 8.9–10.3)
Chloride: 102 mmol/L (ref 98–111)
Creatinine, Ser: 0.95 mg/dL (ref 0.61–1.24)
GFR, Estimated: >60 mL/min (ref >60)
Glucose, Bld: 153 mg/dL — ABNORMAL HIGH (ref 70–99)
Potassium: 3.8 mmol/L (ref 3.5–5.1)
Sodium: 135 mmol/L (ref 135–145)
Total Bilirubin: 0.9 mg/dL (ref 0.0–1.2)
Total Protein: 6.5 g/dL (ref 6.5–8.1)

## 2024-01-22 LAB — CBC WITH DIFFERENTIAL/PLATELET
Abs Immature Granulocytes: 0.01 K/uL (ref 0.00–0.07)
Basophils Absolute: 0 K/uL (ref 0.0–0.1)
Basophils Relative: 1 %
Eosinophils Absolute: 0 K/uL (ref 0.0–0.5)
Eosinophils Relative: 1 %
HCT: 44.8 % (ref 39.0–52.0)
Hemoglobin: 15.5 g/dL (ref 13.0–17.0)
Immature Granulocytes: 0 %
Lymphocytes Relative: 36 %
Lymphs Abs: 2.1 K/uL (ref 0.7–4.0)
MCH: 30.3 pg (ref 26.0–34.0)
MCHC: 34.6 g/dL (ref 30.0–36.0)
MCV: 87.5 fL (ref 80.0–100.0)
Monocytes Absolute: 0.3 K/uL (ref 0.1–1.0)
Monocytes Relative: 5 %
Neutro Abs: 3.5 K/uL (ref 1.7–7.7)
Neutrophils Relative %: 57 %
Platelets: 288 K/uL (ref 150–400)
RBC: 5.12 MIL/uL (ref 4.22–5.81)
RDW: 12.6 % (ref 11.5–15.5)
WBC: 6 K/uL (ref 4.0–10.5)
nRBC: 0 % (ref 0.0–0.2)

## 2024-01-22 LAB — POCT URINE DRUG SCREEN - MANUAL ENTRY (I-SCREEN)
POC Amphetamine UR: NOT DETECTED
POC Buprenorphine (BUP): NOT DETECTED
POC Cocaine UR: NOT DETECTED
POC Marijuana UR: NOT DETECTED
POC Methadone UR: NOT DETECTED
POC Methamphetamine UR: NOT DETECTED
POC Morphine: NOT DETECTED
POC Oxazepam (BZO): NOT DETECTED
POC Oxycodone UR: NOT DETECTED
POC Secobarbital (BAR): NOT DETECTED

## 2024-01-22 LAB — ETHANOL: Alcohol, Ethyl (B): 252 mg/dL — ABNORMAL HIGH (ref <15)

## 2024-01-22 LAB — HEMOGLOBIN A1C
Hgb A1c MFr Bld: 5.6 % (ref 4.8–5.6)
Mean Plasma Glucose: 114.02 mg/dL

## 2024-01-22 LAB — MAGNESIUM: Magnesium: 2.3 mg/dL (ref 1.7–2.4)

## 2024-01-22 LAB — TSH: TSH: 1.014 u[IU]/mL (ref 0.350–4.500)

## 2024-01-22 MED ORDER — LOPERAMIDE HCL 2 MG PO CAPS: 2.0000 mg | ORAL_CAPSULE | ORAL | Status: DC | PRN

## 2024-01-22 MED ORDER — LORAZEPAM 2 MG/ML IJ SOLN: 2.0000 mg | Freq: Three times a day (TID) | INTRAMUSCULAR | Status: DC | PRN

## 2024-01-22 MED ORDER — HALOPERIDOL LACTATE 5 MG/ML IJ SOLN: 5.0000 mg | Freq: Three times a day (TID) | INTRAMUSCULAR | Status: DC | PRN

## 2024-01-22 MED ORDER — THIAMINE HCL 100 MG/ML IJ SOLN
100.0000 mg | Freq: Once | INTRAMUSCULAR | Status: DC
Start: 2024-01-22 — End: 2024-01-24

## 2024-01-22 MED ORDER — DIPHENHYDRAMINE HCL 50 MG/ML IJ SOLN: 50.0000 mg | Freq: Three times a day (TID) | INTRAMUSCULAR | Status: DC | PRN

## 2024-01-22 MED ORDER — HALOPERIDOL 5 MG PO TABS: 5.0000 mg | ORAL_TABLET | Freq: Three times a day (TID) | ORAL | Status: DC | PRN

## 2024-01-22 MED ORDER — NICOTINE 21 MG/24HR TD PT24
21.0000 mg | MEDICATED_PATCH | Freq: Every day | TRANSDERMAL | Status: DC
  Administered 2024-01-23: 21 mg via TRANSDERMAL
  Filled 2024-01-22 (×2): qty 1

## 2024-01-22 MED ORDER — ACETAMINOPHEN 325 MG PO TABS
650.0000 mg | ORAL_TABLET | Freq: Four times a day (QID) | ORAL | Status: DC | PRN
  Administered 2024-01-22 – 2024-01-23 (×2): 650 mg via ORAL
  Filled 2024-01-22 (×2): qty 2

## 2024-01-22 MED ORDER — CHLORDIAZEPOXIDE HCL 25 MG PO CAPS: 25.0000 mg | ORAL_CAPSULE | ORAL | Status: DC

## 2024-01-22 MED ORDER — DIPHENHYDRAMINE HCL 50 MG PO CAPS: 50.0000 mg | ORAL_CAPSULE | Freq: Three times a day (TID) | ORAL | Status: DC | PRN

## 2024-01-22 MED ORDER — NICOTINE 14 MG/24HR TD PT24
14.0000 mg | MEDICATED_PATCH | Freq: Every day | TRANSDERMAL | Status: DC
  Administered 2024-01-22: 14 mg via TRANSDERMAL
  Filled 2024-01-22: qty 1

## 2024-01-22 MED ORDER — HYDROXYZINE HCL 25 MG PO TABS: 25.0000 mg | ORAL_TABLET | Freq: Three times a day (TID) | ORAL | Status: DC | PRN

## 2024-01-22 MED ORDER — CHLORDIAZEPOXIDE HCL 25 MG PO CAPS: 25.0000 mg | ORAL_CAPSULE | Freq: Three times a day (TID) | ORAL | Status: DC

## 2024-01-22 MED ORDER — ALUM & MAG HYDROXIDE-SIMETH 200-200-20 MG/5ML PO SUSP: 30.0000 mL | ORAL | Status: DC | PRN

## 2024-01-22 MED ORDER — CHLORDIAZEPOXIDE HCL 25 MG PO CAPS
50.0000 mg | ORAL_CAPSULE | Freq: Once | ORAL | Status: AC
  Administered 2024-01-22: 50 mg via ORAL
  Filled 2024-01-22: qty 2

## 2024-01-22 MED ORDER — ADULT MULTIVITAMIN W/MINERALS CH
1.0000 | ORAL_TABLET | Freq: Every day | ORAL | Status: DC
  Administered 2024-01-22 – 2024-01-23 (×2): 1 via ORAL
  Filled 2024-01-22 (×2): qty 1

## 2024-01-22 MED ORDER — BACLOFEN 10 MG PO TABS
10.0000 mg | ORAL_TABLET | Freq: Three times a day (TID) | ORAL | Status: DC
  Administered 2024-01-22 – 2024-01-24 (×6): 10 mg via ORAL
  Filled 2024-01-22 (×6): qty 1

## 2024-01-22 MED ORDER — BUPROPION HCL ER (XL) 150 MG PO TB24
150.0000 mg | ORAL_TABLET | Freq: Every day | ORAL | Status: DC
  Administered 2024-01-23 – 2024-01-24 (×2): 150 mg via ORAL
  Filled 2024-01-22 (×2): qty 1

## 2024-01-22 MED ORDER — ONDANSETRON 4 MG PO TBDP: 4.0000 mg | ORAL_TABLET | Freq: Four times a day (QID) | ORAL | Status: DC | PRN

## 2024-01-22 MED ORDER — CHLORDIAZEPOXIDE HCL 25 MG PO CAPS
25.0000 mg | ORAL_CAPSULE | Freq: Four times a day (QID) | ORAL | Status: DC
  Administered 2024-01-22 – 2024-01-23 (×3): 25 mg via ORAL
  Filled 2024-01-22 (×3): qty 1

## 2024-01-22 MED ORDER — CHLORDIAZEPOXIDE HCL 25 MG PO CAPS
25.0000 mg | ORAL_CAPSULE | Freq: Every day | ORAL | Status: DC
Start: 2024-01-26 — End: 2024-01-27

## 2024-01-22 MED ORDER — HYDROXYZINE HCL 25 MG PO TABS: 25.0000 mg | ORAL_TABLET | Freq: Four times a day (QID) | ORAL | Status: DC | PRN

## 2024-01-22 MED ORDER — CHLORDIAZEPOXIDE HCL 25 MG PO CAPS: 25.0000 mg | ORAL_CAPSULE | Freq: Four times a day (QID) | ORAL | Status: DC | PRN

## 2024-01-22 MED ORDER — HALOPERIDOL LACTATE 5 MG/ML IJ SOLN: 10.0000 mg | Freq: Three times a day (TID) | INTRAMUSCULAR | Status: DC | PRN

## 2024-01-22 MED ORDER — MAGNESIUM HYDROXIDE 400 MG/5ML PO SUSP: 30.0000 mL | Freq: Every day | ORAL | Status: DC | PRN

## 2024-01-22 MED ORDER — TRAZODONE HCL 50 MG PO TABS
50.0000 mg | ORAL_TABLET | Freq: Every evening | ORAL | Status: DC | PRN
  Administered 2024-01-22 – 2024-01-23 (×2): 50 mg via ORAL
  Filled 2024-01-22 (×2): qty 1

## 2024-01-22 NOTE — Group Note (Signed)
 Group Topic: Overcoming Obstacles  Group Date: 01/22/2024 Start Time: 0815 End Time: 9152 Facilitators: Lonzell Dwayne RAMAN, NT  Department: [facility]  Number of Participants: 2  Group Focus: relapse prevention Treatment Modality:  Psychoeducation Interventions utilized were clarification Purpose: enhance coping skills  Name: Brendan Ochoa Date of Birth: [birth year]  MR: [id number]    Level of Participation: Patient did not attend group Quality of Participation: N/A Interactions with others: N/A Mood/Affect: N/A Triggers (if applicable): N/A Cognition: N/A Progress: N/A Response: N/A Plan N/A 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

## 2024-01-22 NOTE — ED Notes (Signed)
Patient transferred to [facility] ?

## 2024-01-22 NOTE — Group Note (Signed)
 Group Topic: Recovery Basics  Group Date: 01/22/2024 Start Time: 1700 End Time: 1730 Facilitators: Herold Lajuana NOVAK, RN  Department: [facility]  Number of Participants: 6  Group Focus: coping skills Treatment Modality:  Leisure Development Interventions utilized were leisure development Purpose: relapse prevention strategies  Name: Brendan Ochoa Date of Birth: 07-05-1975  MR: [id number]    Level of Participation: active Quality of Participation: attentive Interactions with others: gave feedback Mood/Affect: agitated and appropriate Triggers (if applicable): none identified Cognition: coherent/clear Progress: Gaining insight Response: I will call a sponsor when I start craving something to drink Plan: patient will be encouraged to talk openly to sponsor when having cravings  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   Epididymal cyst 06/14/2020   Unintentional weight loss 06/14/2020   Abnormal chest x-ray 06/14/2020   Elevated blood pressure reading without diagnosis of hypertension 05/27/2020   Influenza vaccination declined 05/27/2020   AKI (acute kidney injury) (HCC) 01/01/2020   Vomiting 01/01/2020   Cocaine  abuse (HCC) 01/01/2020   Alcohol  abuse 01/01/2020   Ileitis 02/18/2018   HYPERGLYCEMIA 11/01/2009   Tobacco dependence 07/18/2007   SUBSTANCE ABUSE, MULTIPLE  07/18/2007   Allergic rhinitis 07/18/2007   Asthma 07/18/2007   GERD 07/18/2007   Crohn's disease (HCC) 07/18/2007

## 2024-01-22 NOTE — Discharge Instructions (Signed)
 Transfer to [facility]

## 2024-01-22 NOTE — Progress Notes (Signed)
   Individual  Time: 9:00 am to 9:10 am  [employer] staff walks [name] to the [facility] IOP door and asks this therapist to step out of group.  We go into a private room. [name] starts apologizing saying he cannot stop drinking.  Therapists asks him to elaborate on what happened since Monday's [facility] group.  [name] says he just cannot stop drinking. He said he started drinking after group on Monday and has not stopped drinking. Therapist explains he would benefit from detox services and walks [name].  Therapist discusses residential services as [name] is struggling with trying to stop his use.  He says he is going to ask the staff to refer him. This therapist explains he can call after he finishes detox if he wishes to return to [facility].  Darice Simpler, MS, LMFT, LCAS

## 2024-01-22 NOTE — Progress Notes (Signed)
   01/22/24 0923  [facility] Triage Screening (Walk-ins at [facility] only)  What Is the Reason for Your Visit/Call Today? PT Brendan Ochoa 67Y male presents to [facility], unaccompanied, voluntarily. PT was brought down by 2nd floor staff. PT states he has been diagnosed with MDD, but feels the diagnosis is not true. PT states he takes medications but stopped when he started drinking heavily again. PT states he is looking to detox from alcohol . PT denies SI, HI, AVH and substance use. PT stated his last drink was about an hr ago. PT states he has been going back and forth with his alcohol  addiction for over 20 years.  How Long Has This Been Causing You Problems? > than 6 months  Have You Recently Had Any Thoughts About Hurting Yourself? No  Are You Planning to Commit Suicide/Harm Yourself At This time? No  Have you Recently Had Thoughts About Hurting Someone Sherral? No  Are You Planning To Harm Someone At This Time? No  Physical Abuse Denies  Verbal Abuse Denies  Sexual Abuse  (When asked, pt remained quiet for a long time and changed the subject)  Self-Neglect Denies  Are you currently experiencing any auditory, visual or other hallucinations? No  Have You Used Any Alcohol  or Drugs in the Past 24 Hours? Yes  What Did You Use and How Much? Beer (12 cans), a little bit of liquor not much  Clinician description of patient physical appearance/behavior: neat, cooperative, smells of alcohol   What Do You Feel Would Help You the Most Today? Alcohol  or Drug Use Treatment;Social Support;Treatment for Depression or other mood problem  Determination of Need Urgent (48 hours)  Options For Referral [facility] Urgent Care;Facility-Based Crisis;Medication Management;Outpatient Therapy  Determination of Need filed? Yes

## 2024-01-22 NOTE — BH Assessment (Signed)
 Comprehensive Clinical Assessment (CCA) Note  01/22/2024 Bard HERO Radziewicz [id number]  DISPOSITION:  Per Luke Lesches NP pt is recommended for detox at [facility]  The patient demonstrates the following risk factors for suicide: Chronic risk factors for suicide include: psychiatric disorder of MDD and substance use disorder. Acute risk factors for suicide include: unemployment. Protective factors for this patient include: positive therapeutic relationship and hope for the future. Considering these factors, the overall suicide risk at this point appears to be low. Patient is appropriate for outpatient follow up.    Per Triage assessment: "PT Tasean Mancha 43Y male presents to [facility], unaccompanied, voluntarily. PT was brought down by 2nd floor staff. PT states he has been diagnosed with MDD, but feels the diagnosis is not true. PT states he takes medications but stopped when he started drinking heavily again. PT states he is looking to detox from alcohol . PT denies SI, HI, AVH and substance use. PT stated his last drink was about an hr ago. PT states he has been going back and forth with his alcohol  addiction for over 20 years.  With further assessment: Pt is a 48 yo male who presented today for his IOP program and was brought downstairs to be assessed for detox instead. Pt stated that he has relapsed and has been drinking daily for several weeks. Pt stated that his last alcohol  was about 1 hour prior to presentation. Pt stated that he has drinking about a 12-pack of beer daily plus some liquor but could not quantify the amount. Pt denied any cocaine  or cannabis use recently and stated that he tested negative in a test earlier this week at IOP. Pt denied any hx of withdrawal seizures or DTs. Pt denied any SI, HI, AVH or paranoia. Pt did not appear to be responding to internal stimuli or experiencing delusional thinking.   Pt stated that he does not have a permanent home currently and has been staying  with his mother. Pt stated he is currently unemployed. Pt stated that he sees a provider at [facility] for medication management and has been attending the IOP program for the past few weeks. Pt denied any past psychiatric hospitalizations. Pt stated that there has been nothing specific that triggers his relapse.    Pt is calm, alert, fully oriented, cooperative, pleasant with a depressed mood and flat affect. Pt is small framed, appears of a normal weight and seems adequately groomed. Pt's judgment and insight seems good. Pt's speech and movement is within normal limits. Pt did not appear to be responding to internal stimuli or experiencing delusional thinking.    Chief Complaint:  Chief Complaint  Patient presents with   Alcohol  Problem   Depression   Visit Diagnosis:  Alcohol  Use d/o, Severe    CCA Screening, Triage and Referral (STR)  Patient Reported Information How did you hear about us ? Self  What Is the Reason for Your Visit/Call Today? PT Florentino Laabs 8Y male presents to [facility], unaccompanied, voluntarily. PT was brought down by 2nd floor staff. PT states he has been diagnosed with MDD, but feels the diagnosis is not true. PT states he takes medications but stopped when he started drinking heavily again. PT states he is looking to detox from alcohol . PT denies SI, HI, AVH and substance use. PT stated his last drink was about an hr ago. PT states he has been going back and forth with his alcohol  addiction for over 20 years.  How Long Has This Been Causing You  Problems? > than 6 months  What Do You Feel Would Help You the Most Today? Alcohol  or Drug Use Treatment; Social Support; Treatment for Depression or other mood problem   Have You Recently Had Any Thoughts About Hurting Yourself? No  Are You Planning to Commit Suicide/Harm Yourself At This time? No   Flowsheet Row ED from 01/22/2024 in [facility] Clinical Support from 01/15/2024 in [facility] ED from 09/19/2023 in [facility]  C-SSRS RISK CATEGORY No Risk No Risk No Risk    Have you Recently Had Thoughts About Hurting Someone Sherral? No  Are You Planning to Harm Someone at This Time? No  Explanation: Denies HI   Have You Used Any Alcohol  or Drugs in the Past 24 Hours? Yes  How Long Ago Did You Use Drugs or Alcohol ? Earlier today What Did You Use and How Much? Beer (12 cans), a little bit of liquor not much   Do You Currently Have a Therapist/Psychiatrist? Yes  Name of Therapist/Psychiatrist:  [facility]  Have You Been Recently Discharged From Any Office Practice or Programs? no  Explanation of Discharge From Practice/Program:       CCA Screening Triage Referral Assessment Type of Contact: Face-to-Face  Telemedicine Service Delivery:   Is this Initial or Reassessment?   Date Telepsych consult ordered in [facility]:    Time Telepsych consult ordered in [facility]:    Location of Assessment: [facility] Assessment Services  Provider Location: [facility] Assessment Services   Collateral Involvement: none   Does Patient Have a Automotive engineer Guardian? No  Legal Guardian Contact Information: na  Copy of Legal Guardianship Form: -- (na)  Legal Guardian Notified of Arrival: -- (na)  Legal Guardian Notified of Pending Discharge: -- (na)  If Minor and Not Living with Parent(s), Who has Custody? adult  Is CPS involved or ever been involved? Never (none reported)  Is APS involved or ever been involved? Never   Patient Determined To Be At Risk for Harm To Self or Others Based on Review of Patient Reported Information or Presenting Complaint? No  Method: No Plan  Availability of Means: No access or NA  Intent: Vague intent or NA  Notification Required: No need or identified person  Additional Information for Danger to Others Potential: -- (na)  Additional Comments for Danger to Others Potential: none  Are  There Guns or Other Weapons in Your Home? No  Types of Guns/Weapons: pt denied  Are These Weapons Safely Secured?                            -- (na)  Who Could Verify You Are Able To Have These Secured: na  Do You Have any Outstanding Charges, Pending Court Dates, Parole/Probation? pt denied  Contacted To Inform of Risk of Harm To Self or Others: -- (na)    Does Patient Present under Involuntary Commitment? No    [place] of Residence: [name]   Patient Currently Receiving the Following Services: CD--IOP (Intensive Chemical Dependency Program); Medication Management   Determination of Need: Urgent (48 hours) (Per Luke Lesches NP pt is recommended for detox at [facility])   Options For Referral: Other: Comment (Residential CD treatment)     CCA Biopsychosocial Patient Reported Schizophrenia/Schizoaffective Diagnosis in Past: No   Strengths: Pt is seeking help with alcoholism.   Mental Health Symptoms Depression:  Sleep (too much or  little); Worthlessness; Hopelessness; Tearfulness; Difficulty Concentrating; Increase/decrease in appetite   Duration of Depressive symptoms: Duration of Depressive Symptoms: Greater than two weeks   Mania:  None   Anxiety:   Difficulty concentrating; Worrying   Psychosis:  None   Duration of Psychotic symptoms:    Trauma:  None   Obsessions:  None   Compulsions:  None   Inattention:  Forgetful; Loses things   Hyperactivity/Impulsivity:  None   Oppositional/Defiant Behaviors:  None   Emotional Irregularity:  None   Other Mood/Personality Symptoms:  None.    Mental Status Exam Appearance and self-care  Stature:  Average   Weight:  Average weight   Clothing:  Casual   Grooming:  Normal   Cosmetic use:  None   Posture/gait:  Normal   Motor activity:  Not Remarkable   Sensorium  Attention:  Normal   Concentration:  Normal   Orientation:  X5   Recall/memory:  Normal   Affect and Mood  Affect:  Depressed;  Flat   Mood:  Depressed   Relating  Eye contact:  Normal   Facial expression:  Responsive   Attitude toward examiner:  Cooperative   Thought and Language  Speech flow: Normal; Paucity   Thought content:  Appropriate to Mood and Circumstances   Preoccupation:  None   Hallucinations:  None   Organization:  Coherent   [employer] of Knowledge:  Fair   Intelligence:  Average   Abstraction:  Functional   Judgement:  Poor   Reality Testing:  Adequate   Insight:  Fair   Decision Making:  Impulsive   Social Functioning  Social Maturity:  Impulsive   Social Judgement:  Normal   Stress  Stressors:  Other (Comment) (Pt reports, he worried about his life.)   Coping Ability:  Overwhelmed   Skill Deficits:  Decision making   Supports:  Family     Religion: Religion/Spirituality Are You A Religious Person?: Yes How Might This Affect Treatment?: None.  Leisure/Recreation: Leisure / Recreation Do You Have Hobbies?: Yes Leisure and Hobbies: Naval architect, fishing.  Exercise/Diet: Exercise/Diet Do You Exercise?: No Have You Gained or Lost A Significant Amount of Weight in the Past Six Months?: No Do You Follow a Special Diet?: No Do You Have Any Trouble Sleeping?: Yes Explanation of Sleeping Difficulties: Pt reports, he doesn't really sleep.   CCA Employment/Education Employment/Work Situation: Employment / Work Situation Employment Situation: Unemployed Patient's Job has Been Impacted by Current Illness: No Has Patient ever Been in Equities trader?: No  Education: Education Is Patient Currently Attending School?: No Last Grade Completed: 12 Did You Product manager?: Yes What Type of College Degree Do you Have?: none reported Did You Have An Individualized Education Program (IIEP): No Did You Have Any Difficulty At School?: No   CCA Family/Childhood History Family and Relationship History: Family history Marital status: Single Does  patient have children?: No  Childhood History:  Childhood History By whom was/is the patient raised?: Both parents Did patient suffer any verbal/emotional/physical/sexual abuse as a child?: No Has patient ever been sexually abused/assaulted/raped as an adolescent or adult?: No Witnessed domestic violence?: No Has patient been affected by domestic violence as an adult?: No       CCA Substance Use Alcohol /Drug Use: Alcohol  / Drug Use Pain Medications: see MAR Prescriptions: see MAR Over the Counter: see MAR History of alcohol  / drug use?: Yes Longest period of sobriety (when/how long): Per chart, 2 years sober approximately 10 years  ago. Negative Consequences of Use: Personal relationships, Legal Withdrawal Symptoms: None Substance #1 Name of Substance 1: alcohol  1 - Age of First Use: 17 1 - Amount (size/oz): a 12 pack daily plus some unknown amount of liquor 1 - Frequency: daily 1 - Duration: ongoing 1 - Last Use / Amount: about 1 hour prior to admission today 1 - Method of Aquiring: purchase 1- Route of Use: oral                       ASAM's:  Six Dimensions of Multidimensional Assessment  Dimension 1:  Acute Intoxication and/or Withdrawal Potential:   Dimension 1:  Description of individual's past and current experiences of substance use and withdrawal: None.  Dimension 2:  Biomedical Conditions and Complications:   Dimension 2:  Description of patient's biomedical conditions and  complications: Chron's  Dimension 3:  Emotional, Behavioral, or Cognitive Conditions and Complications:  Dimension 3:  Description of emotional, behavioral, or cognitive conditions and complications: depression that has varied in severity.  Dimension 4:  Readiness to Change:  Dimension 4:  Description of Readiness to Change criteria: willing to enter treatment.  Dimension 5:  Relapse, Continued use, or Continued Problem Potential:  Dimension 5:  Relapse, continued use, or continued  problem potential critiera description: Little recognition and understanding of substance use relapse skills  Dimension 6:  Recovery/Living Environment:  Dimension 6:  Recovery/Iiving environment criteria description: Currently living at [employer] [facility].  ASAM Severity Score: ASAM's Severity Rating Score: 9  ASAM Recommended Level of Treatment: ASAM Recommended Level of Treatment: Level III Residential Treatment   Substance use Disorder (SUD) Substance Use Disorder (SUD)  Checklist Symptoms of Substance Use: Continued use despite having a persistent/recurrent physical/psychological problem caused/exacerbated by use, Continued use despite persistent or recurrent social, interpersonal problems, caused or exacerbated by use, Evidence of tolerance, Evidence of withdrawal (Comment), Persistent desire or unsuccessful efforts to cut down or control use, Presence of craving or strong urge to use, Repeated use in physically hazardous situations, Substance(s) often taken in larger amounts or over longer times than was intended  Recommendations for Services/Supports/Treatments: Recommendations for Services/Supports/Treatments Recommendations For Services/Supports/Treatments: CD-IOP Intensive Chemical Dependency Program, Residential-Level 2  Disposition Recommendation per psychiatric provider: We recommend transfer to [facility]. 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   Generalized anxiety disorder 05/23/2022   Sleep disturbances 05/23/2022   Alcohol  use disorder, severe, dependence (HCC)    Cocaine  use disorder, severe, dependence (HCC)    Cannabis use disorder, severe, dependence (HCC)    Alcohol  abuse with withdrawal (HCC) 04/27/2021   Acute encephalopathy    Hypokalemia     Tobacco abuse    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   Epididymal cyst 06/14/2020   Unintentional weight loss 06/14/2020   Abnormal chest x-ray 06/14/2020   Elevated blood pressure reading without diagnosis of hypertension 05/27/2020   Influenza vaccination declined 05/27/2020   AKI (acute kidney injury) (HCC) 01/01/2020   Vomiting 01/01/2020   Cocaine  abuse (HCC) 01/01/2020   Alcohol  abuse 01/01/2020   Ileitis 02/18/2018   HYPERGLYCEMIA 11/01/2009   Tobacco dependence 07/18/2007   SUBSTANCE ABUSE, MULTIPLE 07/18/2007   Allergic rhinitis 07/18/2007  Asthma 07/18/2007   GERD 07/18/2007   Crohn's disease (HCC) 07/18/2007     Referrals to Alternative Service(s): Referred to Alternative Service(s):   Place:   Date:   Time:    Referred to Alternative Service(s):   Place:   Date:   Time:    Referred to Alternative Service(s):   Place:   Date:   Time:    Referred to Alternative Service(s):   Place:   Date:   Time:     Lorilei Horan T, Counselor

## 2024-01-22 NOTE — ED Notes (Signed)
 Meeting with the provider and TTS

## 2024-01-22 NOTE — Group Note (Signed)
 Group Topic: Recovery Basics  Group Date: 01/22/2024 Start Time: 2000 End Time: 2100 Facilitators: Anice Benton LABOR, NT  Department: [facility]  Number of Participants: 3  Group Focus: relapse prevention, self-awareness, and substance abuse education(AA Group) Treatment Modality:  Patient-Centered Therapy Interventions utilized were group exercise Purpose: relapse prevention strategies  Name: Brendan Ochoa Date of Birth: 19-Oct-1975  MR: [id number]    Level of Participation: Did Not Attend  Quality of Participation: N/A Interactions with others: N/A Mood/Affect: N/A Triggers (if applicable): N/A Cognition: N/A Progress: Other Response: N/A Plan: patient will be encouraged to attend groups.   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

## 2024-01-22 NOTE — ED Notes (Signed)
 Pt sleeping in no acute distress. RR even and unlabored. Environment secured. Will continue to monitor for safety.

## 2024-01-22 NOTE — ED Provider Notes (Signed)
 Facility Based Crisis Admission H&P  Date: 01/22/24 Patient Name: Brendan Ochoa MRN: [id number] Chief Complaint:   Diagnoses:  Final diagnoses:  Alcohol  use disorder, severe, dependence (HCC)  MDD (major depressive disorder), recurrent severe, without psychosis (HCC)   Brendan Ochoa, 48 y.o., male patient seen face to face by this provider, consulted with Dr. Cole; and chart reviewed on 01/22/24.   HPI:  History of Present illness: Brendan Ochoa is a 48 y.o. male, with history of alcohol  use disorder, history of cocaine  use disorder, and MDD. Patient is here voluntary seeking detox from alcohol  abuse. Patient has been in the CDIOP program here at [facility], for over the last 2 weeks and reports that he continues to abuse alcohol  daily for the last month after being sober for 3 months. Patient endorses that he has been trying to sustain his sobriety but relapsed one month ago and his alcohol  intake has continually increased. Reports currently drinking 12 or more beers per day.  Occasionally drinks alcohol  but is completely dependent on beer.  Patient is also being treated for major depressive disorder and is seen in [facility] outpatient clinic and is followed by [facility].  He is currently prescribed Wellbutrin  150 mg XL and recently was prescribed Librium  25 mg. Patient is prescribed baclofen  for pain management for closed wrist fracture he sustained back in July.  Patient denies any use of illicit substances although patient has a distant history of cocaine  dependence.  Patient also endorses that he is a smoker and smokes half a pack of cigarettes per day.  Patient endorses that he is currently unsheltered and occasionally stays at his mom's house and other times Service.  He is currently unemployed.  He is seeking to detox from alcohol  with a plan to go to a residential treatment facility following his detox admission.  Patient denies any history of complicated withdrawal symptoms  such as seizures or DTs.  Patient denies any chronic medical conditions and reports taking no other medications than the medications he informed this Clinical research associate of during admission.   During evaluation Nghia Mcentee Alonzo is sitting upright  in no acute distress. He is alert, oriented x 4, calm, cooperative and attentive. His mood is euthymic with congruent affect.  He has normal speech, and behavior.  Objectively there is no evidence of psychosis/mania or delusional thinking.  Patient is able to converse coherently, goal directed thoughts, no distractibility, or pre-occupation.  He also denies suicidal/self-harm/homicidal ideation, psychosis, and paranoia.  Patient answered question appropriately.  Patient meets criteria for facility based crisis treatment for detox and MDD. Withdrawal Symptoms: None    PHQ 2-9:  Flowsheet Row Clinical Support from 01/15/2024 in [facility] Coordination from 08/12/2023 in [facility] Coordination ED from 07/28/2023 in [facility]  Thoughts that you would be better off dead, or of hurting yourself in some way Not at all Not at all Not at all  PHQ-9 Total Score 18 7 0    Flowsheet Row ED from 01/22/2024 in [facility] Most recent reading at 01/22/2024 11:46 AM ED from 01/22/2024 in [facility] Most recent reading at 01/22/2024  9:36 AM Clinical Support from 01/15/2024 in [facility] Most recent reading at 01/15/2024  3:04 PM  C-SSRS RISK CATEGORY No Risk No Risk No Risk    Screenings    Flowsheet Row Most Recent Value  CIWA-Ar Total  1    Total Time spent with patient: 45 minutes  Musculoskeletal  Strength & Muscle Tone: within normal limits Gait & Station: normal Patient leans: N/A  Psychiatric Specialty Exam  Presentation General Appearance:  Appropriate for Environment  Eye  Contact: Good  Speech: Clear and Coherent  Speech Volume: Normal  Handedness: Right   Mood and Affect  Mood: Anxious  Affect: Congruent   Thought Process  Thought Processes: Linear  Descriptions of Associations:Intact  Orientation:Full (Time, Place and Person)  Thought Content:WDL  Diagnosis of Schizophrenia or Schizoaffective disorder in past: No   Hallucinations: None Ideas of Reference:None  Suicidal Thoughts:No data recorded Homicidal Thoughts:Homicidal Thoughts: No   Sensorium  Memory: Immediate Good; Recent Good  Judgment: Fair  Insight: Fair   Art therapist  Concentration: Good  Attention Span: Good  Recall: Good  Fund of Knowledge: Good  Language: Good   Psychomotor Activity  Psychomotor Activity:Psychomotor Activity: Normal   Assets  Assets: Communication Skills; Desire for Improvement; Physical Health   Sleep  Sleep: Fair    Physical Exam onstitutional:      General: He is not in acute distress.    Appearance: Normal appearance. He is not ill-appearing.  HENT:     Head: Normocephalic.     Nose: Nasal deformity present.  Eyes:     Extraocular Movements: Extraocular movements intact.     Conjunctiva/sclera: Conjunctivae normal.     Pupils: Pupils are equal, round, and reactive to light.  Cardiovascular:     Rate and Rhythm: Normal rate and regular rhythm.  Pulmonary:     Effort: Pulmonary effort is normal.     Breath sounds: Normal breath sounds.  Musculoskeletal:     Cervical back: Normal range of motion and neck supple.  Skin:    General: Skin is warm and dry.  Neurological:     General: No focal deficit present.     Mental Status: He is alert and oriented to person, place, and time.  Review of Systems  Psychiatric/Behavioral:  Positive for depression and substance abuse. The patient is nervous/anxious.     Blood pressure 113/76, pulse (!) 102, temperature 98.5 F (36.9 C), temperature source  Oral, resp. rate 16, SpO2 96%. There is no height or weight on file to calculate BMI.  Past Psychiatric History: Major Depressive Disorder, Alcohol  Use Disorder,   Is the patient at risk to self? No  Has the patient been a risk to self in the past 6 months? No .    Has the patient been a risk to self within the distant past? No   Is the patient a risk to others? No   Has the patient been a risk to others in the past 6 months? No   Has the patient been a risk to others within the distant past? No   Past Medical History: None Reported Family History:  None Reported  Social History: Lives with mother locally here in [place] occasionally. Mostly couch-surfs and homeless. Unemployed   Last Labs:  Admission on 01/22/2024, Discharged on 01/22/2024  Component Date Value Ref Range Status   WBC 01/22/2024 6.0  4.0 - 10.5 K/uL Final   RBC 01/22/2024 5.12  4.22 - 5.81 MIL/uL Final   Hemoglobin 01/22/2024 15.5  13.0 - 17.0 g/dL Final   HCT 90/89/7974 44.8  39.0 - 52.0 % Final   MCV 01/22/2024 87.5  80.0 - 100.0 fL Final   MCH 01/22/2024 30.3  26.0 - 34.0 pg Final  MCHC 01/22/2024 34.6  30.0 - 36.0 g/dL Final   RDW 90/89/7974 12.6  11.5 - 15.5 % Final   Platelets 01/22/2024 288  150 - 400 K/uL Final   nRBC 01/22/2024 0.0  0.0 - 0.2 % Final   Neutrophils Relative % 01/22/2024 57  % Final   Neutro Abs 01/22/2024 3.5  1.7 - 7.7 K/uL Final   Lymphocytes Relative 01/22/2024 36  % Final   Lymphs Abs 01/22/2024 2.1  0.7 - 4.0 K/uL Final   Monocytes Relative 01/22/2024 5  % Final   Monocytes Absolute 01/22/2024 0.3  0.1 - 1.0 K/uL Final   Eosinophils Relative 01/22/2024 1  % Final   Eosinophils Absolute 01/22/2024 0.0  0.0 - 0.5 K/uL Final   Basophils Relative 01/22/2024 1  % Final   Basophils Absolute 01/22/2024 0.0  0.0 - 0.1 K/uL Final   Immature Granulocytes 01/22/2024 0  % Final   Abs Immature Granulocytes 01/22/2024 0.01  0.00 - 0.07 K/uL Final   Performed at [facility],  [address], [place], [place] [address]   POC Amphetamine UR 01/22/2024 None Detected  NONE DETECTED (Cut Off Level 1000 ng/mL) Final   POC Secobarbital (BAR) 01/22/2024 None Detected  NONE DETECTED (Cut Off Level 300 ng/mL) Final   POC Buprenorphine (BUP) 01/22/2024 None Detected  NONE DETECTED (Cut Off Level 10 ng/mL) Final   POC Oxazepam (BZO) 01/22/2024 None Detected  NONE DETECTED (Cut Off Level 300 ng/mL) Final   POC Cocaine  UR 01/22/2024 None Detected  NONE DETECTED (Cut Off Level 300 ng/mL) Final   POC Methamphetamine UR 01/22/2024 None Detected  NONE DETECTED (Cut Off Level 1000 ng/mL) Final   POC Morphine  01/22/2024 None Detected  NONE DETECTED (Cut Off Level 300 ng/mL) Final   POC Methadone UR 01/22/2024 None Detected  NONE DETECTED (Cut Off Level 300 ng/mL) Final   POC Oxycodone UR 01/22/2024 None Detected  NONE DETECTED (Cut Off Level 100 ng/mL) Final   POC Marijuana UR 01/22/2024 None Detected  NONE DETECTED (Cut Off Level 50 ng/mL) Final   Sodium 01/22/2024 135  135 - 145 mmol/L Final   Potassium 01/22/2024 3.8  3.5 - 5.1 mmol/L Final   Chloride 01/22/2024 102  98 - 111 mmol/L Final   CO2 01/22/2024 22  22 - 32 mmol/L Final   Glucose, Bld 01/22/2024 153 (H)  70 - 99 mg/dL Final   Glucose reference range applies only to samples taken after fasting for at least 8 hours.   BUN 01/22/2024 14  6 - 20 mg/dL Final   Creatinine, Ser 01/22/2024 0.95  0.61 - 1.24 mg/dL Final   Calcium 90/89/7974 9.1  8.9 - 10.3 mg/dL Final   Total Protein 90/89/7974 6.5  6.5 - 8.1 g/dL Final   Albumin 90/89/7974 3.7  3.5 - 5.0 g/dL Final   AST 90/89/7974 25  15 - 41 U/L Final   ALT 01/22/2024 23  0 - 44 U/L Final   Alkaline Phosphatase 01/22/2024 49  38 - 126 U/L Final   Total Bilirubin 01/22/2024 0.9  0.0 - 1.2 mg/dL Final   GFR, Estimated 01/22/2024 >60  >60 mL/min Final   Comment: (NOTE) Calculated using the CKD-EPI Creatinine Equation (2021)    Anion gap 01/22/2024 11  5 - 15 Final   Performed  at [facility], [address], [place], [place] [address]   TSH 01/22/2024 1.014  0.350 - 4.500 uIU/mL Final   Comment: Performed by a 3rd Generation  assay with a functional sensitivity of <=0.01 uIU/mL. Performed at [facility], [address], [place], [place] [address]    Alcohol , Ethyl (B) 01/22/2024 252 (H)  <15 mg/dL Final   Comment: (NOTE) For medical purposes only. Performed at [facility], [address] [place], [place] [address]    Hgb A1c MFr Bld 01/22/2024 5.6  4.8 - 5.6 % Final   Comment: (NOTE) Diagnosis of Diabetes The following HbA1c ranges recommended by the American Diabetes Association (ADA) may be used as an aid in the diagnosis of diabetes mellitus.  Hemoglobin             Suggested A1C NGSP%              Diagnosis  <5.7                   Non Diabetic  5.7-6.4                Pre-Diabetic  >6.4                   Diabetic  <7.0                   Glycemic control for                       adults with diabetes.     Mean Plasma Glucose 01/22/2024 114.02  mg/dL Final   Performed at [facility], [address], [place], [place] [address]   Cholesterol 01/22/2024 203 (H)  0 - 200 mg/dL Final   Triglycerides 90/89/7974 266 (H)  <150 mg/dL Final   HDL 90/89/7974 80  >40 mg/dL Final   Total CHOL/HDL Ratio 01/22/2024 2.5  RATIO Final   VLDL 01/22/2024 53 (H)  0 - 40 mg/dL Final   LDL Cholesterol 01/22/2024 70  0 - 99 mg/dL Final   Comment:        Total Cholesterol/HDL:CHD Risk Coronary Heart Disease Risk Table                     Men   Women  1/2 Average Risk   3.4   3.3  Average Risk       5.0   4.4  2 X Average Risk   9.6   7.1  3 X Average Risk  23.4   11.0        Use the calculated Patient Ratio above and the CHD Risk Table to determine the patient's CHD Risk.        ATP III CLASSIFICATION (LDL):  <100     mg/dL   Optimal  899-870  mg/dL   Near or Above                    Optimal  130-159  mg/dL   Borderline  839-810   mg/dL   High  >809     mg/dL   Very High Performed at [facility], [address], [place], [place] [address]    Magnesium  01/22/2024 2.3  1.7 - 2.4 mg/dL Final   Performed at [facility], [address] [place], [place] [address]  Admission on 07/28/2023, Discharged on 07/29/2023  Component Date Value Ref Range Status   POC Amphetamine UR 07/28/2023 None Detected  NONE DETECTED (Cut Off Level 1000 ng/mL) Final   POC Secobarbital (BAR) 07/28/2023 None Detected  NONE DETECTED (  Cut Off Level 300 ng/mL) Final   POC Buprenorphine (BUP) 07/28/2023 None Detected  NONE DETECTED (Cut Off Level 10 ng/mL) Final   POC Oxazepam (BZO) 07/28/2023 None Detected  NONE DETECTED (Cut Off Level 300 ng/mL) Final   POC Cocaine  UR 07/28/2023 None Detected  NONE DETECTED (Cut Off Level 300 ng/mL) Final   POC Methamphetamine UR 07/28/2023 None Detected  NONE DETECTED (Cut Off Level 1000 ng/mL) Final   POC Morphine  07/28/2023 None Detected  NONE DETECTED (Cut Off Level 300 ng/mL) Final   POC Methadone UR 07/28/2023 None Detected  NONE DETECTED (Cut Off Level 300 ng/mL) Final   POC Oxycodone UR 07/28/2023 None Detected  NONE DETECTED (Cut Off Level 100 ng/mL) Final   POC Marijuana UR 07/28/2023 Positive (A)  NONE DETECTED (Cut Off Level 50 ng/mL) Final  Admission on 07/27/2023, Discharged on 07/28/2023  Component Date Value Ref Range Status   WBC 07/27/2023 5.4  4.0 - 10.5 K/uL Final   RBC 07/27/2023 4.79  4.22 - 5.81 MIL/uL Final   Hemoglobin 07/27/2023 14.9  13.0 - 17.0 g/dL Final   HCT 96/84/7974 43.0  39.0 - 52.0 % Final   MCV 07/27/2023 89.8  80.0 - 100.0 fL Final   MCH 07/27/2023 31.1  26.0 - 34.0 pg Final   MCHC 07/27/2023 34.7  30.0 - 36.0 g/dL Final   RDW 96/84/7974 13.2  11.5 - 15.5 % Final   Platelets 07/27/2023 242  150 - 400 K/uL Final   nRBC 07/27/2023 0.0  0.0 - 0.2 % Final   Neutrophils Relative % 07/27/2023 40  % Final   Neutro Abs 07/27/2023 2.2  1.7 - 7.7 K/uL Final    Lymphocytes Relative 07/27/2023 52  % Final   Lymphs Abs 07/27/2023 2.8  0.7 - 4.0 K/uL Final   Monocytes Relative 07/27/2023 6  % Final   Monocytes Absolute 07/27/2023 0.4  0.1 - 1.0 K/uL Final   Eosinophils Relative 07/27/2023 1  % Final   Eosinophils Absolute 07/27/2023 0.1  0.0 - 0.5 K/uL Final   Basophils Relative 07/27/2023 1  % Final   Basophils Absolute 07/27/2023 0.0  0.0 - 0.1 K/uL Final   Immature Granulocytes 07/27/2023 0  % Final   Abs Immature Granulocytes 07/27/2023 0.01  0.00 - 0.07 K/uL Final   Performed at [facility], [address], [place], [place] [address]   Sodium 07/27/2023 139  135 - 145 mmol/L Final   Potassium 07/27/2023 3.8  3.5 - 5.1 mmol/L Final   Chloride 07/27/2023 101  98 - 111 mmol/L Final   CO2 07/27/2023 23  22 - 32 mmol/L Final   Glucose, Bld 07/27/2023 91  70 - 99 mg/dL Final   Glucose reference range applies only to samples taken after fasting for at least 8 hours.   BUN 07/27/2023 13  6 - 20 mg/dL Final   Creatinine, Ser 07/27/2023 0.79  0.61 - 1.24 mg/dL Final   Calcium 96/84/7974 9.1  8.9 - 10.3 mg/dL Final   Total Protein 96/84/7974 6.2 (L)  6.5 - 8.1 g/dL Final   Albumin 96/84/7974 3.6  3.5 - 5.0 g/dL Final   AST 96/84/7974 44 (H)  15 - 41 U/L Final   ALT 07/27/2023 27  0 - 44 U/L Final   Alkaline Phosphatase 07/27/2023 63  38 - 126 U/L Final   Total Bilirubin 07/27/2023 0.5  0.0 - 1.2 mg/dL Final   GFR, Estimated 07/27/2023 >60  >60 mL/min Final   Comment: (  NOTE) Calculated using the CKD-EPI Creatinine Equation (2021)    Anion gap 07/27/2023 15  5 - 15 Final   Performed at [facility], [address] [place], [place] [address]   TSH 07/27/2023 2.054  0.350 - 4.500 uIU/mL Final   Comment: Performed by a 3rd Generation assay with a functional sensitivity of <=0.01 uIU/mL. Performed at [facility], [address] [place], [place] [address]    Alcohol , Ethyl (B) 07/27/2023 154 (H)  <10 mg/dL Final   Comment:  (NOTE) Lowest detectable limit for serum alcohol  is 10 mg/dL.  For medical purposes only. Performed at [facility], [address] [place], [place] [address]    Magnesium  07/27/2023 2.1  1.7 - 2.4 mg/dL Final   Performed at [facility], [address], [place], [place] [address]   Cholesterol 07/27/2023 184  0 - 200 mg/dL Final   Triglycerides 96/84/7974 544 (H)  <150 mg/dL Final   HDL 96/84/7974 69  >40 mg/dL Final   Total CHOL/HDL Ratio 07/27/2023 2.7  RATIO Final   VLDL 07/27/2023 UNABLE TO CALCULATE IF TRIGLYCERIDE OVER 400 mg/dL  0 - 40 mg/dL Final   LDL Cholesterol 07/27/2023 UNABLE TO CALCULATE IF TRIGLYCERIDE OVER 400 mg/dL  0 - 99 mg/dL Final   Comment:        Total Cholesterol/HDL:CHD Risk Coronary Heart Disease Risk Table                     Men   Women  1/2 Average Risk   3.4   3.3  Average Risk       5.0   4.4  2 X Average Risk   9.6   7.1  3 X Average Risk  23.4   11.0        Use the calculated Patient Ratio above and the CHD Risk Table to determine the patient's CHD Risk.        ATP III CLASSIFICATION (LDL):  <100     mg/dL   Optimal  899-870  mg/dL   Near or Above                    Optimal  130-159  mg/dL   Borderline  839-810  mg/dL   High  >809     mg/dL   Very High Performed at [facility], [address], [place], [place] [address]    Direct LDL 07/27/2023 65  0 - 99 mg/dL Final   Performed at [facility], [address], [place], [place] [address]  Office Visit on 07/25/2023  Component Date Value Ref Range Status   POC Glucose 07/25/2023 144 (A)  70 - 99 mg/dl Final   Hemoglobin J8R 07/25/2023 5.6  4.0 - 5.6 % Final    Allergies: Patient has no known allergies.  Medications:  Facility Ordered Medications  Medication   acetaminophen  (TYLENOL ) tablet 650 mg   alum & mag hydroxide-simeth (MAALOX/MYLANTA) 200-200-20 MG/5ML suspension 30 mL   magnesium  hydroxide (MILK OF MAGNESIA) suspension 30 mL   haloperidol   (HALDOL ) tablet 5 mg   And   diphenhydrAMINE  (BENADRYL ) capsule 50 mg   haloperidol  lactate (HALDOL ) injection 5 mg   And   diphenhydrAMINE  (BENADRYL ) injection 50 mg   And   LORazepam  (ATIVAN ) injection 2 mg   haloperidol  lactate (HALDOL ) injection 10 mg   And   diphenhydrAMINE  (BENADRYL ) injection 50 mg   And  LORazepam  (ATIVAN ) injection 2 mg   hydrOXYzine  (ATARAX ) tablet 25 mg   traZODone  (DESYREL ) tablet 50 mg   [START ON 01/23/2024] nicotine  (NICODERM CQ  - dosed in mg/24 hours) patch 21 mg   thiamine  (VITAMIN B1) injection 100 mg   multivitamin with minerals tablet 1 tablet   chlordiazePOXIDE  (LIBRIUM ) capsule 25 mg   hydrOXYzine  (ATARAX ) tablet 25 mg   loperamide  (IMODIUM ) capsule 2-4 mg   ondansetron  (ZOFRAN -ODT) disintegrating tablet 4 mg   [COMPLETED] chlordiazePOXIDE  (LIBRIUM ) capsule 50 mg   chlordiazePOXIDE  (LIBRIUM ) capsule 25 mg   Followed by   NOREEN ON 01/24/2024] chlordiazePOXIDE  (LIBRIUM ) capsule 25 mg   Followed by   NOREEN ON 01/25/2024] chlordiazePOXIDE  (LIBRIUM ) capsule 25 mg   Followed by   NOREEN ON 01/26/2024] chlordiazePOXIDE  (LIBRIUM ) capsule 25 mg   baclofen  (LIORESAL ) tablet 10 mg   [START ON 01/23/2024] buPROPion  (WELLBUTRIN  XL) 24 hr tablet 150 mg   nicotine  (NICODERM CQ  - dosed in mg/24 hours) patch 14 mg   PTA Medications  Medication Sig   buPROPion  (WELLBUTRIN  XL) 150 MG 24 hr tablet Take 1 tablet (150 mg total) by mouth daily.   baclofen  (LIORESAL ) 10 MG tablet Take 1 tablet (10 mg total) by mouth 3 (three) times daily.    Long Term Goals: Improvement in symptoms so as ready for discharge and Abstain from Alcohol  Use   Short Term Goals: Patient will verbalize feelings in meetings with treatment team members., Patient will attend at least of 50% of the groups daily., Pt will complete the PHQ9 on admission, day 3 and discharge., Patient will participate in completing the [place] Suicide Severity Rating Scale, Patient will score a low risk of  violence for 24 hours prior to discharge, and Patient will take medications as prescribed daily.  Medical Decision Making  Based on my evaluation I certify that psychiatric inpatient services furnished can reasonably be expected to improve the patient's condition which I recommend transfer to an appropriate accepting facility.  1. Alcohol  use disorder, severe, dependence (HCC) (Primary) -Admit to [facility] -CIWA protocol, Librium  Taper  -Consider restarting Naltrexone  to reduce cravings associated with alcohol  dependence     2. Severe episode of recurrent major depressive disorder, without psychotic features (HCC)  -Restart Wellbutrin  150 mg  XL tomorrow  -Routine 15 minute safety checks  Attend all groups. Discharge to a residential treatment facility.   Labs ordered this encounter: TSH, rule out thyroid  dysfunction related to depressed mood Lipid, standard lab ordered for all patients received an antipsychotic therapy Ethanol, standard order for all patient reporting alcohol  misuse or overuse, or to rule out intoxication in patient presenting with psychiatric complaint or behavioral changes  Hemoglobin A1c, standard lab ordered for all patients received an antipsychotic therapy CBC, stander to rule out any infection or anemia CMP, rule out metabolic conditions which may be contributing to mental health symptoms UDS, standard order, rule out substance induced mood vs altered mental status related to substance use      Recommendations  Based on my evaluation the patient does not appear to have an emergency medical condition. Daily contact with patient to evaluate/discuss progression toward goals/medication management/and discharge planning. Coordinate Care with Social Work team  Suzen Lesches, NP 01/22/24  4:41 PM

## 2024-01-22 NOTE — Progress Notes (Signed)
 Pt has been accepted to [facility] on 01/22/2024 Bed assignment: 157  Pt meets inpatient criteria per: Suzen Lesches NP  Attending Physician will be: Dr.Bethea MD.   Report can be called to:    Pt can arrive after [facility] will update   Care Team Notified: [facility] Cherylynn Ernst RN, Suzen Lesches NP, Dr. Charlott MD   [place] Khayri Kargbo LCSW-A   01/22/2024 10:42 AM

## 2024-01-22 NOTE — ED Provider Notes (Signed)
 [facility] Medical Screening Exam  Patient Name: Brendan Ochoa MRN: [id number] Date of Evaluation: 01/22/24 Chief Complaint:  I need some help Diagnosis:  Final diagnoses:  Alcohol  use disorder, severe, dependence (HCC)  Severe episode of recurrent major depressive disorder, without psychotic features (HCC)   History of Present illness: Brendan Ochoa is a 48 y.o. male, with history of alcohol  use disorder, history of cocaine  use disorder, and MDD. Patient is here voluntary seeking detox from alcohol  abuse. Patient has been in the CDIOP program here at [facility], for over the last 2 weeks and reports that he continues to abuse alcohol  daily for the last month after being sober for 3 months. Patient endorses that he has been trying to sustain his sobriety but relapsed one month ago and his alcohol  intake has continually increased. Reports currently drinking 12 or more beers per day.  Occasionally drinks alcohol  but is completely dependent on beer.  Patient is also being treated for major depressive disorder and is seen in [facility] outpatient clinic and is followed by [facility].  He is currently prescribed Wellbutrin  150 mg XL and recently was prescribed Librium  25 mg. Patient is prescribed baclofen  for pain management for closed wrist fracture he sustained back in July.  Patient denies any use of illicit substances although patient has a distant history of cocaine  dependence.  Patient also endorses that he is a smoker and smokes half a pack of cigarettes per day.  Patient endorses that he is currently unsheltered and occasionally stays at his mom's house and other times Service.  He is currently unemployed.  He is seeking to detox from alcohol  with a plan to go to a residential treatment facility following his detox admission.  Patient denies any history of complicated withdrawal symptoms such as seizures or DTs.  Patient denies any chronic medical conditions and reports  taking no other medications than the medications he informed this Clinical research associate of during admission.  During evaluation Brendan Ochoa is sitting upright  in no acute distress. He is alert, oriented x 4, calm, cooperative and attentive. His mood is euthymic with congruent affect.  He has normal speech, and behavior.  Objectively there is no evidence of psychosis/mania or delusional thinking.  Patient is able to converse coherently, goal directed thoughts, no distractibility, or pre-occupation.  He also denies suicidal/self-harm/homicidal ideation, psychosis, and paranoia.  Patient answered question appropriately.      Patient is being admitted to facility-based crisis for substance treatment.   Flowsheet Row ED from 01/22/2024 in [facility] Clinical Support from 01/15/2024 in [facility] ED from 09/19/2023 in [facility]  C-SSRS RISK CATEGORY No Risk No Risk No Risk    Psychiatric Specialty Exam  Presentation  General Appearance:Casual  Eye Contact:Good  Speech:Clear and Coherent  Speech Volume:Normal  Handedness:Right   Mood and Affect  Mood: Anxious  Affect: Congruent   Thought Process  Thought Processes: Linear  Descriptions of Associations:Intact  Orientation:Full (Time, Place and Person)  Thought Content:WDL  Diagnosis of Schizophrenia or Schizoaffective disorder in past: No   Hallucinations:None  Ideas of Reference:None  Suicidal Thoughts:No  Homicidal Thoughts:No   Sensorium  Memory: Immediate Fair  Judgment: Fair  Insight: Fair   Art therapist  Concentration: Fair  Attention Span: Fair  Recall: [employer] of Knowledge: Fair  Language: Fair   Psychomotor Activity  Psychomotor Activity: Normal   Assets  Assets: Desire for Improvement;  Housing   Sleep  Sleep: Fair  Number of hours:  6   Physical Exam: Physical  Exam Constitutional:      General: He is not in acute distress.    Appearance: Normal appearance. He is not ill-appearing.  HENT:     Head: Normocephalic.     Nose: Nasal deformity present.  Eyes:     Extraocular Movements: Extraocular movements intact.     Conjunctiva/sclera: Conjunctivae normal.     Pupils: Pupils are equal, round, and reactive to light.  Cardiovascular:     Rate and Rhythm: Normal rate and regular rhythm.  Pulmonary:     Effort: Pulmonary effort is normal.     Breath sounds: Normal breath sounds.  Musculoskeletal:     Cervical back: Normal range of motion and neck supple.  Skin:    General: Skin is warm and dry.  Neurological:     General: No focal deficit present.     Mental Status: He is alert and oriented to person, place, and time.     Review of Systems  Psychiatric/Behavioral:  Positive for depression and substance abuse. Negative for hallucinations and suicidal ideas. The patient is not nervous/anxious.    Blood pressure (!) 111/93, pulse 92, temperature 98.5 F (36.9 C), temperature source Temporal, resp. rate 16, SpO2 96%. There is no height or weight on file to calculate BMI.  Musculoskeletal: Strength & Muscle Tone: within normal limits Gait & Station: normal Patient leans: N/A   [facility] MSE Discharge Disposition for Follow up and Recommendations: Based on my evaluation I certify that psychiatric inpatient services furnished can reasonably be expected to improve the patient's condition which I recommend transfer to an appropriate accepting facility.  1. Alcohol  use disorder, severe, dependence (HCC) (Primary) -Admit to [facility] -CIWA protocol, Librium  Taper  -Consider restarting Naltrexone  to reduce cravings associated with alcohol  dependence    2. Severe episode of recurrent major depressive disorder, without psychotic features (HCC)  -Restart Wellbutrin  150 mg  XL tomorrow  -Routine 15 minute safety checks  Labs ordered this encounter: TSH,  rule out thyroid  dysfunction related to depressed mood Lipid, standard lab ordered for all patients received an antipsychotic therapy Ethanol, standard order for all patient reporting alcohol  misuse or overuse, or to rule out intoxication in patient presenting with psychiatric complaint or behavioral changes  Hemoglobin A1c, standard lab ordered for all patients received an antipsychotic therapy CBC, stander to rule out any infection or anemia CMP, rule out metabolic conditions which may be contributing to mental health symptoms UDS, standard order, rule out substance induced mood vs altered mental status related to substance use    Patient accepted to [facility] for detox treatment and management of dual dx of MDD and AUD.  Suzen Lesches, NP 01/22/2024, 11:23 AM

## 2024-01-22 NOTE — Care Management (Signed)
 [facility] Care Management   Writer met with the patient and discussed discharge planning.  Patient reports that he wants to resume CD-IOP services.  Patient also requests resources for the [facility].    Writer provided the patient with a listing of open vacancies at the [employer].

## 2024-01-22 NOTE — ED Notes (Addendum)
 Patient is in the bed calm and sleeping. NAD.  Respirations even and unlabored. Will monitor for safety.

## 2024-01-22 NOTE — ED Notes (Signed)
 Pt sitting in dayroom watching television and eating dinner. No acute distress noted. No concerns voiced. Informed pt to notify staff with any needs or assistance. Pt verbalized understanding and agreement. Will continue to monitor for safety.

## 2024-01-22 NOTE — ED Notes (Signed)
 Patient is in the bedroom calm and composed. NAD. Will continue to monitor for safety.

## 2024-01-22 NOTE — ED Notes (Addendum)
 Pt admitted to [facility] requesting detox from ETOH. Calm, cooperative throughout interview process.Pt reports drinking a 6-pk beer 3 hrs ago, prior to coming to facility. Pt states, I've got to stop doing this to myself. I don't even have a reason why I started back drinking. Support and encouragement provided. Pt denies withdrawal sx at present. No acute distress noted. Skin assessment completed. Oriented to unit. Meal and drink offered. At currrent, pt denies SI/HI/AVH. Pt verbally contract for safety. Will monitor for safety.

## 2024-01-23 DIAGNOSIS — F17210 Nicotine dependence, cigarettes, uncomplicated: Secondary | ICD-10-CM | POA: Diagnosis not present

## 2024-01-23 DIAGNOSIS — F332 Major depressive disorder, recurrent severe without psychotic features: Secondary | ICD-10-CM | POA: Diagnosis not present

## 2024-01-23 DIAGNOSIS — F1020 Alcohol dependence, uncomplicated: Secondary | ICD-10-CM | POA: Diagnosis not present

## 2024-01-23 DIAGNOSIS — Z560 Unemployment, unspecified: Secondary | ICD-10-CM | POA: Diagnosis not present

## 2024-01-23 MED ORDER — QUETIAPINE FUMARATE 100 MG PO TABS
100.0000 mg | ORAL_TABLET | Freq: Once | ORAL | Status: AC
  Administered 2024-01-23: 100 mg via ORAL
  Filled 2024-01-23: qty 1

## 2024-01-23 MED ORDER — ALBUTEROL SULFATE HFA 108 (90 BASE) MCG/ACT IN AERS: 2.0000 | INHALATION_SPRAY | Freq: Four times a day (QID) | RESPIRATORY_TRACT | Status: DC | PRN

## 2024-01-23 NOTE — Group Note (Signed)
 Group Topic: Wellness  Group Date: 01/23/2024 Start Time: 0900 End Time: 1000 Facilitators: Daved Tinnie HERO, RN  Department: [facility]  Number of Participants: 7  Group Focus: psychiatric education Treatment Modality:  Psychoeducation Interventions utilized were patient education Purpose: relapse prevention strategies  Name: Brendan Ochoa Date of Birth: August 05, 1975  MR: [id number]    Level of Participation: active Quality of Participation: attentive and cooperative Interactions with others: gave feedback Mood/Affect: appropriate Triggers (if applicable): n/a Cognition: coherent/clear Progress: Gaining insight Response: medication discussed with pt, further questions denied. Plan: patient will be encouraged to attend future RN education groups and to discuss medications with RN and provider.   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   Epididymal cyst 06/14/2020   Unintentional weight loss 06/14/2020   Abnormal chest x-ray 06/14/2020   Elevated blood pressure reading without diagnosis of hypertension 05/27/2020   Influenza vaccination declined 05/27/2020   AKI (acute kidney injury) (HCC) 01/01/2020   Vomiting 01/01/2020   Cocaine  abuse (HCC) 01/01/2020   Alcohol  abuse 01/01/2020   Ileitis 02/18/2018   HYPERGLYCEMIA 11/01/2009   Tobacco dependence 07/18/2007    SUBSTANCE ABUSE, MULTIPLE 07/18/2007   Allergic rhinitis 07/18/2007   Asthma 07/18/2007   GERD 07/18/2007   Crohn's disease (HCC) 07/18/2007

## 2024-01-23 NOTE — Group Note (Signed)
 Group Topic: Emotional Regulation  Group Date: 01/23/2024 Start Time: 1400 End Time: 1430 Facilitators: Laneta Renea POUR, NT  Department: [facility]  Number of Participants: 3  Group Focus: self-awareness Treatment Modality:  Psychoeducation Interventions utilized were problem solving Purpose: enhance coping skills  Name: Brendan Ochoa Date of Birth: 07-08-1975  MR: [id number]    Level of Participation: minimal Quality of Participation: cooperative Interactions with others: gave feedback Mood/Affect: appropriate and brightens with interaction Triggers (if applicable): none Cognition: coherent/clear and insightful Progress: Gaining insight Response:  I am working on me  Plan: patient will be encouraged to attend group.   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   Epididymal cyst 06/14/2020   Unintentional weight loss 06/14/2020   Abnormal chest x-ray 06/14/2020   Elevated blood pressure reading without diagnosis of hypertension 05/27/2020   Influenza vaccination declined 05/27/2020   AKI (acute kidney injury) (HCC) 01/01/2020   Vomiting 01/01/2020   Cocaine  abuse (HCC) 01/01/2020   Alcohol  abuse 01/01/2020   Ileitis 02/18/2018   HYPERGLYCEMIA 11/01/2009   Tobacco dependence 07/18/2007   SUBSTANCE ABUSE, MULTIPLE 07/18/2007   Allergic rhinitis 07/18/2007   Asthma  07/18/2007   GERD 07/18/2007   Crohn's disease (HCC) 07/18/2007

## 2024-01-23 NOTE — ED Notes (Signed)
 Patient is in the bedroom sleeping. NAD

## 2024-01-23 NOTE — ED Notes (Signed)
 Pt reports feeling 'not good, not great'. Pt denies si hi and avh- verbal contract for safety provided. Pt reports to have eaten about half of breakfast, RN encouraged hydration. Pt administered prn tylenol  for c/o level 5/10 headache, results effective 1/10 HA. Pt generally polite and pleasant. Medications reviewed, questions denied.

## 2024-01-23 NOTE — ED Provider Notes (Signed)
 Behavioral Health Progress Note  Date and Time: 01/23/2024 2:25 PM Name: Brendan Ochoa MRN:  [id number]  Subjective: Brendan Ochoa is a 48 year old male patient with a past psychiatric history significant for MDD, generalized anxiety disorder and alcohol  use disorder who was [facility] on 01/22/[facility]24 requesting alcohol  detox. Patient states that he was referred from the CD IOP program here at the [facility]. Patient reported that over the past 2 weeks he has been drinking alcohol  daily after he relapsed from a 3 month sobriety. BAL was 252 on arrival. UDS was negative.   On evaluation today, patient states that he feels a lot better. He expresses that he relapsed on alcohol  2 weeks ago after things were not going well with his girlfriend who lives in another country. He does not disclose what that reason was. He states that he was feeling depressed, not working, laying around the house and not doing much of anything. He states that he is not having any alcohol  withdrawal symptoms and that he was not drinking enough this time to have withdrawal. He denies a history of alcohol  withdrawal seizures or delirium tremens. He states that he does not like the way the Librium  makes him feel and would like to stop taking the medication since he is not experiencing any withdrawal symptoms. Agreeable to discontinue the librium  and will continue to monitor for signs and symptoms of alcohol  withdrawal. He also states that he does not feel like he needs to be here but would like to stay until Saturday, January 25, 2024 to continue treatment and then follow-up with CD IOP next week. He denies suicidal thoughts. He denies homicidal thoughts. He denies auditory or visual hallucinations. No paranoia. He reports decreased depression and rates his depression 2 out of 10 with 10 being the worst. He reports good sleep. He reports a good appetite. He denies medication side effects. He denies  physical complaints. He states that the Child psychotherapist gave him a list of [place] in the area and that he is going to call around and see if he can get in and if not he will go and stay with either his sister, brother or nephew. He states that he is currently unemployed because he broke his (L) wrist seven weeks ago.   Diagnosis:  Final diagnoses:  Alcohol  use disorder, severe, dependence (HCC)  MDD (major depressive disorder), recurrent severe, without psychosis (HCC)    Total Time spent with patient: 30 minutes  Past Psychiatric History: History of MDD, generalized anxiety disorder, and alcohol  use disorder  Past Medical History: A documented history of AKI, elevated blood pressure, acute encephalopathy, asthma, Crohn's disease and GERD.  Family Psychiatric  History: Per chart review, maternal grandfather history of bipolar and maternal great uncle completed suicide.  Social History: Patient is currently homeless he states that he occasionally resides with his sister, brother or nephew. He is unemployed.  Current Medications:  Current Facility-Administered Medications  Medication Dose Route Frequency Provider Last Rate Last Admin   acetaminophen  (TYLENOL ) tablet 650 mg  650 mg Oral Q6H PRN Arloa Suzen RAMAN, NP   650 mg at 01/23/24 9077   albuterol  (VENTOLIN  HFA) 108 (90 Base) MCG/ACT inhaler 2 puff  2 puff Inhalation Q6H PRN Cierah Crader L, NP       alum & mag hydroxide-simeth (MAALOX/MYLANTA) 200-200-20 MG/5ML suspension 30 mL  30 mL Oral Q4H PRN Arloa Suzen RAMAN, NP       baclofen  (  LIORESAL ) tablet 10 mg  10 mg Oral TID Arloa Suzen RAMAN, NP   10 mg at 01/23/24 9078   buPROPion  (WELLBUTRIN  XL) 24 hr tablet 150 mg  150 mg Oral Daily Arloa Suzen RAMAN, NP   150 mg at 01/23/24 9078   chlordiazePOXIDE  (LIBRIUM ) capsule 25 mg  25 mg Oral Q6H PRN Arloa Suzen RAMAN, NP       haloperidol  (HALDOL ) tablet 5 mg  5 mg Oral TID PRN Arloa Suzen RAMAN, NP       And   diphenhydrAMINE   (BENADRYL ) capsule 50 mg  50 mg Oral TID PRN Arloa Suzen RAMAN, NP       haloperidol  lactate (HALDOL ) injection 5 mg  5 mg Intramuscular TID PRN Arloa Suzen RAMAN, NP       And   diphenhydrAMINE  (BENADRYL ) injection 50 mg  50 mg Intramuscular TID PRN Arloa Suzen RAMAN, NP       And   LORazepam  (ATIVAN ) injection 2 mg  2 mg Intramuscular TID PRN Arloa Suzen RAMAN, NP       haloperidol  lactate (HALDOL ) injection 10 mg  10 mg Intramuscular TID PRN Arloa Suzen RAMAN, NP       And   diphenhydrAMINE  (BENADRYL ) injection 50 mg  50 mg Intramuscular TID PRN Arloa Suzen RAMAN, NP       And   LORazepam  (ATIVAN ) injection 2 mg  2 mg Intramuscular TID PRN Arloa Suzen RAMAN, NP       hydrOXYzine  (ATARAX ) tablet 25 mg  25 mg Oral TID PRN Arloa Suzen RAMAN, NP       loperamide  (IMODIUM ) capsule 2-4 mg  2-4 mg Oral PRN Arloa Suzen RAMAN, NP       magnesium  hydroxide (MILK OF MAGNESIA) suspension 30 mL  30 mL Oral Daily PRN Arloa Suzen RAMAN, NP       multivitamin with minerals tablet 1 tablet  1 tablet Oral Daily Arloa Suzen RAMAN, NP   1 tablet at 01/23/24 9078   nicotine  (NICODERM CQ  - dosed in mg/24 hours) patch 21 mg  21 mg Transdermal Q0600 Arloa Suzen RAMAN, NP   21 mg at 01/23/24 9078   ondansetron  (ZOFRAN -ODT) disintegrating tablet 4 mg  4 mg Oral Q6H PRN Arloa Suzen RAMAN, NP       thiamine  (VITAMIN B1) injection 100 mg  100 mg Intramuscular Once Arloa Suzen RAMAN, NP       traZODone  (DESYREL ) tablet 50 mg  50 mg Oral QHS PRN Arloa Suzen RAMAN, NP   50 mg at 01/22/24 2124   Current Outpatient Medications  Medication Sig Dispense Refill   albuterol  (VENTOLIN  HFA) 108 (90 Base) MCG/ACT inhaler Inhale 2 puffs into the lungs every 6 (six) hours as needed for wheezing or shortness of breath.     baclofen  (LIORESAL ) 10 MG tablet Take 1 tablet (10 mg total) by mouth 3 (three) times daily. 90 tablet 2   buPROPion  (WELLBUTRIN  XL) 150 MG 24 hr tablet Take 1 tablet (150 mg total) by mouth daily.  30 tablet 1    Labs  Lab Results:  Admission on 01/22/2024, Discharged on 01/22/2024  Component Date Value Ref Range Status   WBC 01/22/2024 6.0  4.0 - 10.5 K/uL Final   RBC 01/22/2024 5.12  4.22 - 5.81 MIL/uL Final   Hemoglobin 01/22/2024 15.5  13.0 - 17.0 g/dL Final   HCT 90/89/7974 44.8  39.0 - 52.0 % Final   MCV 01/22/2024 87.5  80.0 - 100.0 fL Final  MCH 01/22/2024 30.3  26.0 - 34.0 pg Final   MCHC 01/22/2024 34.6  30.0 - 36.0 g/dL Final   RDW 90/89/7974 12.6  11.5 - 15.5 % Final   Platelets 01/22/2024 288  150 - 400 K/uL Final   nRBC 01/22/2024 0.0  0.0 - 0.2 % Final   Neutrophils Relative % 01/22/2024 57  % Final   Neutro Abs 01/22/2024 3.5  1.7 - 7.7 K/uL Final   Lymphocytes Relative 01/22/2024 36  % Final   Lymphs Abs 01/22/2024 2.1  0.7 - 4.0 K/uL Final   Monocytes Relative 01/22/2024 5  % Final   Monocytes Absolute 01/22/2024 0.3  0.1 - 1.0 K/uL Final   Eosinophils Relative 01/22/2024 1  % Final   Eosinophils Absolute 01/22/2024 0.0  0.0 - 0.5 K/uL Final   Basophils Relative 01/22/2024 1  % Final   Basophils Absolute 01/22/2024 0.0  0.0 - 0.1 K/uL Final   Immature Granulocytes 01/22/2024 0  % Final   Abs Immature Granulocytes 01/22/2024 0.01  0.00 - 0.07 K/uL Final   Performed at [facility], [address] [place], [place] [address]   POC Amphetamine UR 01/22/2024 None Detected  NONE DETECTED (Cut Off Level 1000 ng/mL) Final   POC Secobarbital (BAR) 01/22/2024 None Detected  NONE DETECTED (Cut Off Level 300 ng/mL) Final   POC Buprenorphine (BUP) 01/22/2024 None Detected  NONE DETECTED (Cut Off Level 10 ng/mL) Final   POC Oxazepam (BZO) 01/22/2024 None Detected  NONE DETECTED (Cut Off Level 300 ng/mL) Final   POC Cocaine  UR 01/22/2024 None Detected  NONE DETECTED (Cut Off Level 300 ng/mL) Final   POC Methamphetamine UR 01/22/2024 None Detected  NONE DETECTED (Cut Off Level 1000 ng/mL) Final   POC Morphine  01/22/2024 None Detected  NONE DETECTED (Cut Off Level  300 ng/mL) Final   POC Methadone UR 01/22/2024 None Detected  NONE DETECTED (Cut Off Level 300 ng/mL) Final   POC Oxycodone UR 01/22/2024 None Detected  NONE DETECTED (Cut Off Level 100 ng/mL) Final   POC Marijuana UR 01/22/2024 None Detected  NONE DETECTED (Cut Off Level 50 ng/mL) Final   Sodium 01/22/2024 135  135 - 145 mmol/L Final   Potassium 01/22/2024 3.8  3.5 - 5.1 mmol/L Final   Chloride 01/22/2024 102  98 - 111 mmol/L Final   CO2 01/22/2024 22  22 - 32 mmol/L Final   Glucose, Bld 01/22/2024 153 (H)  70 - 99 mg/dL Final   Glucose reference range applies only to samples taken after fasting for at least 8 hours.   BUN 01/22/2024 14  6 - 20 mg/dL Final   Creatinine, Ser 01/22/2024 0.95  0.61 - 1.24 mg/dL Final   Calcium 90/89/7974 9.1  8.9 - 10.3 mg/dL Final   Total Protein 90/89/7974 6.5  6.5 - 8.1 g/dL Final   Albumin 90/89/7974 3.7  3.5 - 5.0 g/dL Final   AST 90/89/7974 25  15 - 41 U/L Final   ALT 01/22/2024 23  0 - 44 U/L Final   Alkaline Phosphatase 01/22/2024 49  38 - 126 U/L Final   Total Bilirubin 01/22/2024 0.9  0.0 - 1.2 mg/dL Final   GFR, Estimated 01/22/2024 >60  >60 mL/min Final   Comment: (NOTE) Calculated using the CKD-EPI Creatinine Equation (2021)    Anion gap 01/22/2024 11  5 - 15 Final   Performed at [facility], [address], [place], [place] [address]   TSH 01/22/2024 1.014  0.350 - 4.500  uIU/mL Final   Comment: Performed by a 3rd Generation assay with a functional sensitivity of <=0.01 uIU/mL. Performed at [facility], [address], [place], [place] [address]    Alcohol , Ethyl (B) 01/22/2024 252 (H)  <15 mg/dL Final   Comment: (NOTE) For medical purposes only. Performed at [facility], [address] [place], [place] [address]    Hgb A1c MFr Bld 01/22/2024 5.6  4.8 - 5.6 % Final   Comment: (NOTE) Diagnosis of Diabetes The following HbA1c ranges recommended by the American Diabetes Association (ADA) may be used as an aid  in the diagnosis of diabetes mellitus.  Hemoglobin             Suggested A1C NGSP%              Diagnosis  <5.7                   Non Diabetic  5.7-6.4                Pre-Diabetic  >6.4                   Diabetic  <7.0                   Glycemic control for                       adults with diabetes.     Mean Plasma Glucose 01/22/2024 114.02  mg/dL Final   Performed at [facility], [address] [place], [place] [address]   Cholesterol 01/22/2024 203 (H)  0 - 200 mg/dL Final   Triglycerides 90/89/7974 266 (H)  <150 mg/dL Final   HDL 90/89/7974 80  >40 mg/dL Final   Total CHOL/HDL Ratio 01/22/2024 2.5  RATIO Final   VLDL 01/22/2024 53 (H)  0 - 40 mg/dL Final   LDL Cholesterol 01/22/2024 70  0 - 99 mg/dL Final   Comment:        Total Cholesterol/HDL:CHD Risk Coronary Heart Disease Risk Table                     Men   Women  1/2 Average Risk   3.4   3.3  Average Risk       5.0   4.4  2 X Average Risk   9.6   7.1  3 X Average Risk  23.4   11.0        Use the calculated Patient Ratio above and the CHD Risk Table to determine the patient's CHD Risk.        ATP III CLASSIFICATION (LDL):  <100     mg/dL   Optimal  899-870  mg/dL   Near or Above                    Optimal  130-159  mg/dL   Borderline  839-810  mg/dL   High  >809     mg/dL   Very High Performed at [facility], [address] [place], [place] [address]    Magnesium  01/22/2024 2.3  1.7 - 2.4 mg/dL Final   Performed at [facility], [address], [place], [place] [address]  Admission on 07/28/2023, Discharged on 07/29/2023  Component Date Value Ref Range Status   POC Amphetamine UR 07/28/2023 None Detected  NONE DETECTED (Cut Off Level 1000 ng/mL) Final  POC Secobarbital (BAR) 07/28/2023 None Detected  NONE DETECTED (Cut Off Level 300 ng/mL) Final   POC Buprenorphine (BUP) 07/28/2023 None Detected  NONE DETECTED (Cut Off Level 10 ng/mL) Final   POC Oxazepam (BZO) 07/28/2023 None  Detected  NONE DETECTED (Cut Off Level 300 ng/mL) Final   POC Cocaine  UR 07/28/2023 None Detected  NONE DETECTED (Cut Off Level 300 ng/mL) Final   POC Methamphetamine UR 07/28/2023 None Detected  NONE DETECTED (Cut Off Level 1000 ng/mL) Final   POC Morphine  07/28/2023 None Detected  NONE DETECTED (Cut Off Level 300 ng/mL) Final   POC Methadone UR 07/28/2023 None Detected  NONE DETECTED (Cut Off Level 300 ng/mL) Final   POC Oxycodone UR 07/28/2023 None Detected  NONE DETECTED (Cut Off Level 100 ng/mL) Final   POC Marijuana UR 07/28/2023 Positive (A)  NONE DETECTED (Cut Off Level 50 ng/mL) Final  Admission on 07/27/2023, Discharged on 07/28/2023  Component Date Value Ref Range Status   WBC 07/27/2023 5.4  4.0 - 10.5 K/uL Final   RBC 07/27/2023 4.79  4.22 - 5.81 MIL/uL Final   Hemoglobin 07/27/2023 14.9  13.0 - 17.0 g/dL Final   HCT 96/84/7974 43.0  39.0 - 52.0 % Final   MCV 07/27/2023 89.8  80.0 - 100.0 fL Final   MCH 07/27/2023 31.1  26.0 - 34.0 pg Final   MCHC 07/27/2023 34.7  30.0 - 36.0 g/dL Final   RDW 96/84/7974 13.2  11.5 - 15.5 % Final   Platelets 07/27/2023 242  150 - 400 K/uL Final   nRBC 07/27/2023 0.0  0.0 - 0.2 % Final   Neutrophils Relative % 07/27/2023 40  % Final   Neutro Abs 07/27/2023 2.2  1.7 - 7.7 K/uL Final   Lymphocytes Relative 07/27/2023 52  % Final   Lymphs Abs 07/27/2023 2.8  0.7 - 4.0 K/uL Final   Monocytes Relative 07/27/2023 6  % Final   Monocytes Absolute 07/27/2023 0.4  0.1 - 1.0 K/uL Final   Eosinophils Relative 07/27/2023 1  % Final   Eosinophils Absolute 07/27/2023 0.1  0.0 - 0.5 K/uL Final   Basophils Relative 07/27/2023 1  % Final   Basophils Absolute 07/27/2023 0.0  0.0 - 0.1 K/uL Final   Immature Granulocytes 07/27/2023 0  % Final   Abs Immature Granulocytes 07/27/2023 0.01  0.00 - 0.07 K/uL Final   Performed at [facility], [address], [place], [place] [address]   Sodium 07/27/2023 139  135 - 145 mmol/L Final   Potassium 07/27/2023 3.8   3.5 - 5.1 mmol/L Final   Chloride 07/27/2023 101  98 - 111 mmol/L Final   CO2 07/27/2023 23  22 - 32 mmol/L Final   Glucose, Bld 07/27/2023 91  70 - 99 mg/dL Final   Glucose reference range applies only to samples taken after fasting for at least 8 hours.   BUN 07/27/2023 13  6 - 20 mg/dL Final   Creatinine, Ser 07/27/2023 0.79  0.61 - 1.24 mg/dL Final   Calcium 96/84/7974 9.1  8.9 - 10.3 mg/dL Final   Total Protein 96/84/7974 6.2 (L)  6.5 - 8.1 g/dL Final   Albumin 96/84/7974 3.6  3.5 - 5.0 g/dL Final   AST 96/84/7974 44 (H)  15 - 41 U/L Final   ALT 07/27/2023 27  0 - 44 U/L Final   Alkaline Phosphatase 07/27/2023 63  38 - 126 U/L Final   Total Bilirubin 07/27/2023 0.5  0.0 - 1.2 mg/dL Final   GFR,  Estimated 07/27/2023 >60  >60 mL/min Final   Comment: (NOTE) Calculated using the CKD-EPI Creatinine Equation (2021)    Anion gap 07/27/2023 15  5 - 15 Final   Performed at [facility], [address], [place], [place] [address]   TSH 07/27/2023 2.054  0.350 - 4.500 uIU/mL Final   Comment: Performed by a 3rd Generation assay with a functional sensitivity of <=0.01 uIU/mL. Performed at [facility], [address] [place], [place] [address]    Alcohol , Ethyl (B) 07/27/2023 154 (H)  <10 mg/dL Final   Comment: (NOTE) Lowest detectable limit for serum alcohol  is 10 mg/dL.  For medical purposes only. Performed at [facility], [address] [place], [place] [address]    Magnesium  07/27/2023 2.1  1.7 - 2.4 mg/dL Final   Performed at [facility], [address], [place], [place] [address]   Cholesterol 07/27/2023 184  0 - 200 mg/dL Final   Triglycerides 96/84/7974 544 (H)  <150 mg/dL Final   HDL 96/84/7974 69  >40 mg/dL Final   Total CHOL/HDL Ratio 07/27/2023 2.7  RATIO Final   VLDL 07/27/2023 UNABLE TO CALCULATE IF TRIGLYCERIDE OVER 400 mg/dL  0 - 40 mg/dL Final   LDL Cholesterol 07/27/2023 UNABLE TO CALCULATE IF TRIGLYCERIDE OVER 400 mg/dL  0 - 99 mg/dL Final    Comment:        Total Cholesterol/HDL:CHD Risk Coronary Heart Disease Risk Table                     Men   Women  1/2 Average Risk   3.4   3.3  Average Risk       5.0   4.4  2 X Average Risk   9.6   7.1  3 X Average Risk  23.4   11.0        Use the calculated Patient Ratio above and the CHD Risk Table to determine the patient's CHD Risk.        ATP III CLASSIFICATION (LDL):  <100     mg/dL   Optimal  899-870  mg/dL   Near or Above                    Optimal  130-159  mg/dL   Borderline  839-810  mg/dL   High  >809     mg/dL   Very High Performed at [facility], [address], [place], [place] [address]    Direct LDL 07/27/2023 65  0 - 99 mg/dL Final   Performed at [facility], [address] [place], [place] [address]  Office Visit on 07/25/2023  Component Date Value Ref Range Status   POC Glucose 07/25/2023 144 (A)  70 - 99 mg/dl Final   Hemoglobin J8R 07/25/2023 5.6  4.0 - 5.6 % Final    Blood Alcohol  level:  Lab Results  Component Value Date   ETH 252 (H) 01/22/2024   ETH 154 (H) 07/27/2023    Metabolic Disorder Labs: Lab Results  Component Value Date   HGBA1C 5.6 01/22/2024   MPG 114.02 01/22/2024   MPG 120 05/03/2022   Lab Results  Component Value Date   PROLACTIN 4.0 05/03/2022   PROLACTIN 9.7 06/08/2021   Lab Results  Component Value Date   CHOL 203 (H) 01/22/2024   TRIG 266 (H) 01/22/2024   HDL 80 01/22/2024   CHOLHDL 2.5 01/22/2024   VLDL  53 (H) 01/22/2024   LDLCALC 70 01/22/2024   LDLCALC UNABLE TO CALCULATE IF TRIGLYCERIDE OVER 400 mg/dL 96/84/7974    Therapeutic Lab Levels: No results found for: LITHIUM No results found for: VALPROATE No results found for: CBMZ  Physical Findings   AIMS    Flowsheet Row Clinical Support from 01/15/2024 in [facility]  AIMS Total Score 0   AUDIT    Flowsheet Row ED from 01/22/2024 in [facility]  Alcohol  Use Disorder  Identification Test Final Score (AUDIT) 30   GAD-7    Flowsheet Row Clinical Support from 01/15/2024 in [facility] Video Visit from 07/26/2023 in [facility] Office Visit from 07/25/2023 in [facility] [place] [facility] [place] Video Visit from 06/13/2023 in [facility] Video Visit from 04/10/2023 in [facility]  Total GAD-7 Score 4 16 17 13 2    PHQ2-9    Flowsheet Row Clinical Support from 01/15/2024 in [facility] Care Coordination from 08/12/2023 in [facility] Community Care Coordination ED from 07/28/2023 in [facility] Video Visit from 07/26/2023 in [facility] Office Visit from 07/25/2023 in [facility] [place] [facility]  PHQ-2 Total Score 6 2 0 6 6  PHQ-9 Total Score 18 7 0 23 21   Flowsheet Row ED from 01/22/2024 in [facility] Most recent reading at 01/22/2024 11:46 AM ED from 01/22/2024 in [facility] Most recent reading at 01/22/2024  9:36 AM Clinical Support from 01/15/2024 in [facility] Most recent reading at 01/15/2024  3:04 PM  C-SSRS RISK CATEGORY No Risk No Risk No Risk     Musculoskeletal  Strength & Muscle Tone: within normal limits Gait & Station: normal Patient leans: N/A  Psychiatric Specialty Exam  Presentation  General Appearance:  Appropriate for Environment  Eye Contact: Fair  Speech: Clear and Coherent  Speech Volume: Normal  Handedness: Right   Mood and Affect  Mood: Dysphoric  Affect: Congruent   Thought Process  Thought Processes: Coherent; Goal Directed  Descriptions of Associations:Intact  Orientation:Full (Time, Place and Person)  Thought Content:Logical   Diagnosis of Schizophrenia or Schizoaffective disorder in past: No    Hallucinations:Hallucinations: None  Ideas of Reference:None  Suicidal Thoughts:Suicidal Thoughts: No  Homicidal Thoughts:Homicidal Thoughts: No   Sensorium  Memory: Immediate Fair; Recent Fair; Remote Fair  Judgment: Fair  Insight: Fair   Chartered certified accountant: Fair  Attention Span: Fair  Recall: [employer] of Knowledge: Fair  Language: Fair   Psychomotor Activity  Psychomotor Activity: Psychomotor Activity: Normal   Assets  Assets: Communication Skills; Desire for Improvement   Sleep  Sleep: Sleep: Good    Nutritional Assessment (For OBS and FBC admissions only) Has the patient had a weight loss or gain of 10 pounds or more in the last 3 months?: No Has the patient had a decrease in food intake/or appetite?: No Does the patient have dental problems?: No Has the patient recently lost weight without trying?: 0 Has the patient been eating poorly because of a decreased appetite?: 0 Malnutrition Screening Tool Score: 0    Physical Exam  Physical Exam Eyes:     Conjunctiva/sclera: Conjunctivae normal.  Cardiovascular:     Rate and Rhythm:  Normal rate.  Pulmonary:     Effort: Pulmonary effort is normal.  Musculoskeletal:        General: Normal range of motion.     Cervical back: Normal range of motion.  Neurological:     Mental Status: He is alert and oriented to person, place, and time.    Review of Systems  Constitutional: Negative.   HENT: Negative.    Eyes: Negative.   Respiratory: Negative.    Cardiovascular: Negative.   Gastrointestinal: Negative.   Genitourinary: Negative.   Musculoskeletal: Negative.   Neurological: Negative.    Blood pressure 125/86, pulse 93, temperature 97.8 F (36.6 C), resp. rate 17, SpO2 97%. There is no height or weight on file to calculate BMI.  Treatment Plan Summary: Patient admitted to the [facility] for alcohol  detox and mood stabilization. Patient is voluntary.  Anticipated discharge date is scheduled for Saturday, January 25, 2024. Patient to continue following up with CD IOP here at the [facility].  Medication regimen Continue Wellbutrin  150 mg daily for depression Discontinue Librium  taper, per patient request and patient is not having any signs of severe withdrawal  Labs UDS negative BAL 252 TSH WNL A1c WNL Lipid panel, elevated  Disposition, patient to continue follow-up with CD IOP here at the [facility]  Nakiesha Rumsey L, NP 2:25 PM

## 2024-01-23 NOTE — ED Notes (Signed)
 Pt signed 72 hrs discharge form requesting to be discharged.

## 2024-01-23 NOTE — ED Notes (Signed)
 Pt asleep, no CIWA assessment completed at this time.

## 2024-01-23 NOTE — ED Notes (Signed)
 Pt reports that he is still doing well, denying any symptoms of withdrawal. Pt currently resting in bed, pt affirms to have eaten lunch. Pt continues to present as pleasant and cooperative.

## 2024-01-24 ENCOUNTER — Ambulatory Visit: Payer: Self-pay | Admitting: Internal Medicine

## 2024-01-24 ENCOUNTER — Ambulatory Visit (HOSPITAL_COMMUNITY): Payer: MEDICAID

## 2024-01-24 ENCOUNTER — Telehealth (HOSPITAL_COMMUNITY): Payer: Self-pay | Admitting: Licensed Clinical Social Worker

## 2024-01-24 ENCOUNTER — Encounter (HOSPITAL_COMMUNITY): Payer: Self-pay | Admitting: Medical

## 2024-01-24 DIAGNOSIS — F17210 Nicotine dependence, cigarettes, uncomplicated: Secondary | ICD-10-CM | POA: Diagnosis not present

## 2024-01-24 DIAGNOSIS — Z560 Unemployment, unspecified: Secondary | ICD-10-CM | POA: Diagnosis not present

## 2024-01-24 DIAGNOSIS — F332 Major depressive disorder, recurrent severe without psychotic features: Secondary | ICD-10-CM | POA: Diagnosis not present

## 2024-01-24 DIAGNOSIS — F1020 Alcohol dependence, uncomplicated: Secondary | ICD-10-CM | POA: Diagnosis not present

## 2024-01-24 NOTE — ED Provider Notes (Addendum)
 FBC/OBS ASAP Discharge Summary  Date and Time: 01/24/2024 7:59 AM  Name: Brendan Ochoa  MRN:  [id number]   Discharge Diagnoses:  Final diagnoses:  Alcohol  use disorder, severe, dependence (HCC)  MDD (major depressive disorder), recurrent severe, without psychosis (HCC)    Subjective: Patient seen and evaluated face-to-face by this provider, chart reviewed and case discussed with Dr. Cole. On evaluation, patient states that he is ready to discharge and can go upstairs to IOP this morning and states that it starts at 9 AM. He states that he does not need to be here because he is not having any alcohol  withdrawal symptoms. No signs of severe alcohol  withdrawal noted on exam. Most recent CIWA is 0. He states that he also needs to speak with his girlfriend who lives in the [place] because since they have been dating for 2 years they have never had any type of disagreement which triggered him to relapse. He denies suicidal thoughts. He denies homicidal thoughts. He denies auditory visual hallucinations. No paranoia. He reports fair sleep with taking the Seroquel  last night and states that he does not want to continue taking the Seroquel  because it made him feel groggy this morning when he woke up. He reports a fair appetite. He denies physical symptoms. He states that he would probably stay with his mother, sister, brother or nephew. He states that he actually has his own place but is not in his name and that he is working on trying to Artist and sell the property. Patient safely discharged. Patient states that he has Wellbutrin  and does not need any refills at this time.  Stay Summary: SHIGERU LAMPERT is a 47 year old male patient with a past psychiatric history significant for MDD, generalized anxiety disorder and alcohol  use disorder who [facility] requesting alcohol  detox. Patient states that he was referred from the CD IOP program here at  the [facility]. Patient reported that over the past 2 weeks he has been drinking alcohol  daily after he relapsed from a 3 month sobriety. BAL was 252 on arrival. UDS was negative. He was restarted on home medication Wellbutrin  150 mg for depression with no adjustments. He was started on a Librium  taper for alcohol  withdrawal however, he requested to not take medication as it was making him feel drowsy and he was not exhibiting any withdrawal symptoms. Librium  taper was discontinued on 9/11. Patient has been observed without any severe signs or symptoms of alcohol  withdrawal and CIWAs ranged from 0-2. He requested to discharge on 9/11 but was agreeable to staying another night after speaking to Dr. Cole with plans to discharge on 9/12 at go directly to the CD IOP program at 9 AM. He was also given a one-time dose of Seroquel  for sleep on 9/11 and states that he did not want to continue taking the medication because it made him feel groggy.   Total Time spent with patient: 30 minutes  Past Psychiatric History: History of MDD, generalized anxiety disorder, and alcohol  use disorder   Past Medical History: A documented history of AKI, elevated blood pressure, acute encephalopathy, asthma, Crohn's disease and GERD.   Family Psychiatric  History: Per chart review, maternal grandfather history of bipolar and maternal great uncle completed suicide.   Social History: Patient is currently homeless he states that he occasionally resides with his sister, brother or nephew. He is unemployed.  Tobacco Cessation:  A prescription for an FDA-approved tobacco cessation medication  was offered at discharge and the patient refused  Current Medications:  Current Facility-Administered Medications  Medication Dose Route Frequency Provider Last Rate Last Admin   acetaminophen  (TYLENOL ) tablet 650 mg  650 mg Oral Q6H PRN Arloa Suzen RAMAN, NP   650 mg at 01/23/24 9077   albuterol  (VENTOLIN  HFA) 108 (90 Base) MCG/ACT inhaler  2 puff  2 puff Inhalation Q6H PRN Shirla Hodgkiss L, NP       alum & mag hydroxide-simeth (MAALOX/MYLANTA) 200-200-20 MG/5ML suspension 30 mL  30 mL Oral Q4H PRN Arloa Suzen RAMAN, NP       baclofen  (LIORESAL ) tablet 10 mg  10 mg Oral TID Arloa Suzen RAMAN, NP   10 mg at 01/23/24 2101   buPROPion  (WELLBUTRIN  XL) 24 hr tablet 150 mg  150 mg Oral Daily Arloa Suzen RAMAN, NP   150 mg at 01/23/24 9078   chlordiazePOXIDE  (LIBRIUM ) capsule 25 mg  25 mg Oral Q6H PRN Arloa Suzen RAMAN, NP       haloperidol  (HALDOL ) tablet 5 mg  5 mg Oral TID PRN Arloa Suzen RAMAN, NP       And   diphenhydrAMINE  (BENADRYL ) capsule 50 mg  50 mg Oral TID PRN Arloa Suzen RAMAN, NP       haloperidol  lactate (HALDOL ) injection 5 mg  5 mg Intramuscular TID PRN Arloa Suzen RAMAN, NP       And   diphenhydrAMINE  (BENADRYL ) injection 50 mg  50 mg Intramuscular TID PRN Arloa Suzen RAMAN, NP       And   LORazepam  (ATIVAN ) injection 2 mg  2 mg Intramuscular TID PRN Arloa Suzen RAMAN, NP       haloperidol  lactate (HALDOL ) injection 10 mg  10 mg Intramuscular TID PRN Arloa Suzen RAMAN, NP       And   diphenhydrAMINE  (BENADRYL ) injection 50 mg  50 mg Intramuscular TID PRN Arloa Suzen RAMAN, NP       And   LORazepam  (ATIVAN ) injection 2 mg  2 mg Intramuscular TID PRN Arloa Suzen RAMAN, NP       hydrOXYzine  (ATARAX ) tablet 25 mg  25 mg Oral TID PRN Arloa Suzen RAMAN, NP       loperamide  (IMODIUM ) capsule 2-4 mg  2-4 mg Oral PRN Arloa Suzen RAMAN, NP       magnesium  hydroxide (MILK OF MAGNESIA) suspension 30 mL  30 mL Oral Daily PRN Arloa Suzen RAMAN, NP       multivitamin with minerals tablet 1 tablet  1 tablet Oral Daily Arloa Suzen RAMAN, NP   1 tablet at 01/23/24 9078   nicotine  (NICODERM CQ  - dosed in mg/24 hours) patch 21 mg  21 mg Transdermal Q0600 Arloa Suzen RAMAN, NP   21 mg at 01/23/24 9078   ondansetron  (ZOFRAN -ODT) disintegrating tablet 4 mg  4 mg Oral Q6H PRN Arloa Suzen RAMAN, NP       thiamine  (VITAMIN  B1) injection 100 mg  100 mg Intramuscular Once Arloa Suzen RAMAN, NP       traZODone  (DESYREL ) tablet 50 mg  50 mg Oral QHS PRN Arloa Suzen RAMAN, NP   50 mg at 01/23/24 2101   Current Outpatient Medications  Medication Sig Dispense Refill   albuterol  (VENTOLIN  HFA) 108 (90 Base) MCG/ACT inhaler Inhale 2 puffs into the lungs every 6 (six) hours as needed for wheezing or shortness of breath.     baclofen  (LIORESAL ) 10 MG tablet Take 1 tablet (10 mg total) by mouth 3 (three)  times daily. 90 tablet 2   buPROPion  (WELLBUTRIN  XL) 150 MG 24 hr tablet Take 1 tablet (150 mg total) by mouth daily. 30 tablet 1    PTA Medications:  Facility Ordered Medications  Medication   acetaminophen  (TYLENOL ) tablet 650 mg   alum & mag hydroxide-simeth (MAALOX/MYLANTA) 200-200-20 MG/5ML suspension 30 mL   magnesium  hydroxide (MILK OF MAGNESIA) suspension 30 mL   haloperidol  (HALDOL ) tablet 5 mg   And   diphenhydrAMINE  (BENADRYL ) capsule 50 mg   haloperidol  lactate (HALDOL ) injection 5 mg   And   diphenhydrAMINE  (BENADRYL ) injection 50 mg   And   LORazepam  (ATIVAN ) injection 2 mg   haloperidol  lactate (HALDOL ) injection 10 mg   And   diphenhydrAMINE  (BENADRYL ) injection 50 mg   And   LORazepam  (ATIVAN ) injection 2 mg   hydrOXYzine  (ATARAX ) tablet 25 mg   traZODone  (DESYREL ) tablet 50 mg   nicotine  (NICODERM CQ  - dosed in mg/24 hours) patch 21 mg   thiamine  (VITAMIN B1) injection 100 mg   multivitamin with minerals tablet 1 tablet   chlordiazePOXIDE  (LIBRIUM ) capsule 25 mg   loperamide  (IMODIUM ) capsule 2-4 mg   ondansetron  (ZOFRAN -ODT) disintegrating tablet 4 mg   [COMPLETED] chlordiazePOXIDE  (LIBRIUM ) capsule 50 mg   baclofen  (LIORESAL ) tablet 10 mg   buPROPion  (WELLBUTRIN  XL) 24 hr tablet 150 mg   albuterol  (VENTOLIN  HFA) 108 (90 Base) MCG/ACT inhaler 2 puff   [COMPLETED] QUEtiapine  (SEROQUEL ) tablet 100 mg   PTA Medications  Medication Sig   buPROPion  (WELLBUTRIN  XL) 150 MG 24 hr tablet  Take 1 tablet (150 mg total) by mouth daily.   baclofen  (LIORESAL ) 10 MG tablet Take 1 tablet (10 mg total) by mouth 3 (three) times daily.       01/23/2024    6:16 PM 01/15/2024    3:02 PM 08/12/2023    1:26 PM  Depression screen PHQ 2/9  Decreased Interest 1 3 1   Down, Depressed, Hopeless 1 3 1   PHQ - 2 Score 2 6 2   Altered sleeping 1 3 1   Tired, decreased energy 1 2 1   Change in appetite  2 1  Feeling bad or failure about yourself  0 2 1  Trouble concentrating 1 2 0  Moving slowly or fidgety/restless 0 1 1  Suicidal thoughts 0 0 0  PHQ-9 Score 5 18 7   Difficult doing work/chores Somewhat difficult Very difficult Somewhat difficult    Flowsheet Row ED from 01/22/2024 in [facility] Most recent reading at 01/22/2024 11:46 AM ED from 01/22/2024 in [facility] Most recent reading at 01/22/2024  9:36 AM Clinical Support from 01/15/2024 in [facility] Most recent reading at 01/15/2024  3:04 PM  C-SSRS RISK CATEGORY No Risk No Risk No Risk    Musculoskeletal  Strength & Muscle Tone: within normal limits Gait & Station: normal Patient leans: N/A  Psychiatric Specialty Exam  Presentation  General Appearance:  Appropriate for Environment  Eye Contact: Fair  Speech: Clear and Coherent  Speech Volume: Normal  Handedness: Right   Mood and Affect  Mood: Dysphoric  Affect: Congruent   Thought Process  Thought Processes: Coherent; Goal Directed  Descriptions of Associations:Intact  Orientation:Full (Time, Place and Person)  Thought Content:Logical  Diagnosis of Schizophrenia or Schizoaffective disorder in past: No    Hallucinations:Hallucinations: None  Ideas of Reference:None  Suicidal Thoughts:Suicidal Thoughts: No  Homicidal Thoughts:Homicidal Thoughts: No   Sensorium  Memory: Immediate Fair; Recent Fair; Remote  Fair  Judgment: Fair  Insight: Fair   Forensic psychologist: Fair  Attention Span: Fair  Recall: Dotti Abe of Knowledge: Fair  Language: Fair   Psychomotor Activity  Psychomotor Activity: Psychomotor Activity: Normal   Assets  Assets: Manufacturing systems engineer; Desire for Improvement; Leisure Time; Physical Health; Social Support   Sleep  Sleep: Sleep: Fair   Physical Exam  Physical Exam HENT:     Head: Atraumatic.  Eyes:     Conjunctiva/sclera: Conjunctivae normal.  Cardiovascular:     Rate and Rhythm: Normal rate.  Pulmonary:     Effort: Pulmonary effort is normal.  Musculoskeletal:        General: Normal range of motion.     Cervical back: Normal range of motion.  Neurological:     Mental Status: He is alert and oriented to person, place, and time.    Review of Systems  Constitutional: Negative.   HENT: Negative.    Eyes: Negative.   Respiratory: Negative.    Cardiovascular: Negative.   Gastrointestinal: Negative.   Genitourinary: Negative.   Musculoskeletal: Negative.   Neurological: Negative.   Endo/Heme/Allergies: Negative.   Psychiatric/Behavioral:  Positive for substance abuse.    Blood pressure 102/63, pulse 70, temperature 97.8 F (36.6 C), resp. rate 17, SpO2 97%. There is no height or weight on file to calculate BMI.  Demographic Factors:  Male and Low socioeconomic status  Loss Factors: Financial problems/change in socioeconomic status  Historical Factors: Impulsivity  Risk Reduction Factors:   Sense of responsibility to family and Positive social support  Continued Clinical Symptoms:  Alcohol /Substance Abuse/Dependencies  Cognitive Features That Contribute To Risk:  None    Suicide Risk:  Minimal: No identifiable suicidal ideation.  Patients presenting with no risk factors but with morbid ruminations; may be classified as minimal risk based on the severity of the depressive symptoms  Plan Of Care/Follow-up recommendations:   Medications: Patient is to  take medications as prescribed. The patient or patient's guardian is to contact a medical professional and/or outpatient provider to address any new side effects that develop. The patient or the patient's guardian should update outpatient providers of any new medications and/or medication changes.   Outpatient Follow up: [facility] Dependency Intensive Outpatient Program today, January 24, 2024 at 9:00 AM  [address]  [place], [place] [address]  Therapy: We recommend that patient participate in therapy to address mental health concerns.  Safety:   The following safety precautions should be taken:   No sharp objects. This includes scissors, razors, scrapers, and putty knives.   Chemicals should be removed and locked up.   Medications should be removed and locked up.   Weapons should be removed and locked up. This includes firearms, knives and instruments that can be used to cause injury.   The patient should abstain from use of illicit substances/drugs and abuse of any medications.  If symptoms worsen or do not continue to improve or if the patient becomes actively suicidal or homicidal then it is recommended that the patient return to the closest hospital emergency department, the [facility], or call 911 for further evaluation and treatment. National Suicide Prevention Lifeline 1-800-SUICIDE or 567-286-3308.  About 988 988 offers 24/7 access to trained crisis counselors who can help people experiencing mental health-related distress. People can call or text 988 or chat 988lifeline.org for themselves or if they are worried about a loved one who may need crisis support.   Disposition: Discharge.  Hertha Gergen L, NP 01/24/2024, 7:59 AM

## 2024-01-24 NOTE — Telephone Encounter (Signed)
 The therapist attempts to reach [name] as this therapist was told he was being discharged from the [name] today to come to [facility] at 9 a.m.; however, he did not show. The therapist leaves a HIPAA-compliant voicemail.  Brendan Maier, MA, LCSW, [facility], LCAS 01/24/2024

## 2024-01-24 NOTE — ED Notes (Signed)
 Pt is sleeping. No acute distress noted. Q15 safety checks in place.

## 2024-01-24 NOTE — Group Note (Signed)
 Group Topic: Wellness  Group Date: 01/23/2024 Start Time: 1930 End Time: 2000 Facilitators: Carollynn Genre, NT  Department: [facility]  Number of Participants: 4 Group Focus: anxiety and check in Treatment Modality:  Individual Therapy Interventions utilized were story telling Purpose: express feelings  Name: Brendan Ochoa Date of Birth: 12-18-1975  MR: [id number]    Level of Participation: active Quality of Participation: attention seeking Interactions with others: gave feedback Mood/Affect: appropriate Triggers (if applicable): Na Cognition: coherent/clear Progress: Gaining insight Response: good Plan: follow-up needed  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

## 2024-01-24 NOTE — Discharge Instructions (Addendum)
 Discharge recommendations:   Medications: Patient is to take medications as prescribed. The patient or patient's guardian is to contact a medical professional and/or outpatient provider to address any new side effects that develop. The patient or the patient's guardian should update outpatient providers of any new medications and/or medication changes.   Outpatient Follow up: [facility] Dependency Intensive Outpatient Program today, January 24, 2024 at 9:00 AM  [address]  [place], [place] [address]  Therapy: We recommend that patient participate in therapy to address mental health concerns.  Safety:   The following safety precautions should be taken:   No sharp objects. This includes scissors, razors, scrapers, and putty knives.   Chemicals should be removed and locked up.   Medications should be removed and locked up.   Weapons should be removed and locked up. This includes firearms, knives and instruments that can be used to cause injury.   The patient should abstain from use of illicit substances/drugs and abuse of any medications.  If symptoms worsen or do not continue to improve or if the patient becomes actively suicidal or homicidal then it is recommended that the patient return to the closest hospital emergency department, the [facility], or call 911 for further evaluation and treatment. National Suicide Prevention Lifeline 1-800-SUICIDE or (604)024-0963.  About 988 988 offers 24/7 access to trained crisis counselors who can help people experiencing mental health-related distress. People can call or text 988 or chat 988lifeline.org for themselves or if they are worried about a loved one who may need crisis support.

## 2024-01-24 NOTE — ED Notes (Signed)
 Stable. A&O x 4.  Discharging to home/self care with f/u at [facility] IOP at 0900 today.      Denies current SI plan and Intent.  Denies HI and A/V hallucinations.   All belongings returned to PT.  F/U instruction reviewed  Pt verbalized understanding

## 2024-01-27 ENCOUNTER — Ambulatory Visit (HOSPITAL_COMMUNITY): Payer: Self-pay

## 2024-01-27 ENCOUNTER — Telehealth (HOSPITAL_COMMUNITY): Payer: Self-pay | Admitting: Licensed Clinical Social Worker

## 2024-01-27 NOTE — Telephone Encounter (Signed)
 Maveryk leaves a voicemail stating that he will not be in group today as he has a cold.  Zell Maier, MA, LCSW, [facility], LCAS 01/27/2024

## 2024-01-28 ENCOUNTER — Emergency Department (HOSPITAL_COMMUNITY)
Admission: EM | Admit: 2024-01-28 | Discharge: 2024-01-28 | Discharge status: Against Medical Advice | Payer: MEDICAID | Attending: Emergency Medicine | Admitting: Emergency Medicine

## 2024-01-28 DIAGNOSIS — Z5321 Procedure and treatment not carried out due to patient leaving prior to being seen by health care provider: Secondary | ICD-10-CM | POA: Insufficient documentation

## 2024-01-28 DIAGNOSIS — T7840XA Allergy, unspecified, initial encounter: Secondary | ICD-10-CM | POA: Diagnosis present

## 2024-01-28 NOTE — ED Triage Notes (Signed)
 Pt to ED with c/o allergic rx pt says he started taking wellbutrin  and bacoflen a week ago, prescribed by behavioral health, pt endorses drinking yesterday, now reports nausea, sob, headache, blurry vision, says he called prescriber  and was told those are side effects and was told not to drink on these medication.

## 2024-01-28 NOTE — ED Notes (Addendum)
 When pt was told we are full - pt says he is feeling better and breathing is better, if it gets worse he will be back

## 2024-01-29 ENCOUNTER — Ambulatory Visit (HOSPITAL_COMMUNITY): Payer: Self-pay

## 2024-01-29 ENCOUNTER — Telehealth (HOSPITAL_COMMUNITY): Payer: Self-pay | Admitting: Licensed Clinical Social Worker

## 2024-01-29 NOTE — Telephone Encounter (Signed)
 Brendan Ochoa calls saying that he will not be in IOP today as he is still sick as is his entire family and that he may have to go to the hospital today as it is making his asthma worse such that he's having some trouble breathing. He says that he will not be in group Friday as he's getting a blow box installed on his car on Friday. The therapist asks Brendan Ochoa to call back when he is ready to return informing him that he will likely need to touch base with him for a one-on-one.  Brendan Maier, MA, LCSW, [facility], LCAS 01/29/2024

## 2024-01-30 ENCOUNTER — Other Ambulatory Visit (INDEPENDENT_AMBULATORY_CARE_PROVIDER_SITE_OTHER): Payer: MEDICAID

## 2024-01-30 ENCOUNTER — Ambulatory Visit (INDEPENDENT_AMBULATORY_CARE_PROVIDER_SITE_OTHER): Payer: MEDICAID | Admitting: Orthopaedic Surgery

## 2024-01-30 DIAGNOSIS — M25532 Pain in left wrist: Secondary | ICD-10-CM

## 2024-01-30 DIAGNOSIS — S62112A Displaced fracture of triquetrum [cuneiform] bone, left wrist, initial encounter for closed fracture: Secondary | ICD-10-CM | POA: Diagnosis not present

## 2024-01-30 NOTE — Progress Notes (Signed)
 Office Visit Note   Patient: Brendan Ochoa           Date of Birth: 03-21-1976           MRN: [id number] Visit Date: 01/30/2024              Requested by: Vicci Barnie NOVAK, MD [address] [place],  [place] [address] PCP: Vicci Barnie NOVAK, MD   Assessment & Plan: Visit Diagnoses:  1. Displaced fracture of triquetrum (cuneiform) bone, left wrist, initial encounter for closed fracture     Plan: History of Present Illness Brendan Ochoa is a 48 year old male who presents with wrist pain following a fall nine weeks ago. He was referred by Dr. Vicci from [facility] for further evaluation of his wrist injury.  Nine weeks ago, he sustained a wrist injury after falling on sand while playing with his brother's children. Initially, he experienced significant pain and was unable to use his wrist. Over the past week, there has been improvement in pain and functionality, but he was previously unable to lift objects with the affected wrist. He manages pain with over-the-counter medications.  Initial x-rays were taken at the time of injury and are available on his phone. 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  He experiences tenderness on the dorsal aspect of the wrist and is particularly concerned about his ability to grip a golf club and perform carpentry work, which are important activities for him.  Physical Exam MUSCULOSKELETAL: Minimal swelling and mild tenderness of the wrist with improved flexibility.  Results RADIOLOGY Wrist X-ray: Avulsion fracture of the triquetrum with evidence of healing  Assessment and Plan Left triquetral avulsion fracture Nine weeks post-injury with significant improvement. X-rays show healing. Full recovery expected. - Patient declined referral to hand therapy. - Advise OTC pain medication as needed. - Wrist  brace optional for support. - No follow-up unless issues arise.  Follow-Up Instructions: Return if symptoms worsen or fail to improve.   Orders:  Orders Placed This Encounter  Procedures   XR Wrist Complete Left   No orders of the defined types were placed in this encounter.     Procedures: No procedures performed   Clinical Data: No additional findings.   Subjective: Chief Complaint  Patient presents with   Left Wrist - Pain    HPI  Review of Systems  Constitutional: Negative.   HENT: Negative.    Eyes: Negative.   Respiratory: Negative.    Cardiovascular: Negative.   Gastrointestinal: Negative.   Endocrine: Negative.   Genitourinary: Negative.   Skin: Negative.   Allergic/Immunologic: Negative.   Neurological: Negative.   Hematological: Negative.   Psychiatric/Behavioral: Negative.    All other systems reviewed and are negative.    Objective: Vital Signs: There were no vitals taken for this visit.  Physical Exam Vitals and nursing note reviewed.  Constitutional:      Appearance: He is well-developed.  HENT:     Head: Normocephalic and atraumatic.  Eyes:     Pupils: Pupils are equal, round, and reactive to light.  Pulmonary:     Effort: Pulmonary effort is normal.  Abdominal:     Palpations: Abdomen is soft.  Musculoskeletal:        General: Normal range of motion.     Cervical back: Neck supple.  Skin:  General: Skin is warm.  Neurological:     Mental Status: He is alert and oriented to person, place, and time.  Psychiatric:        Behavior: Behavior normal.        Thought Content: Thought content normal.        Judgment: Judgment normal.     Ortho Exam  Specialty Comments:  No specialty comments available.  Imaging: XR Wrist Complete Left Result Date: 01/30/2024 X-rays of the left wrist show a healing triquetral avulsion fracture    PMFS History: Patient Active Problem List   Diagnosis Date Noted   Displaced fracture of  triquetrum (cuneiform) bone, left wrist, initial encounter for closed fracture 01/30/2024   Alcohol  use disorder 07/28/2023   Moderate episode of recurrent major depressive disorder (HCC) 05/23/2022   Generalized anxiety disorder 05/23/2022   Sleep disturbances 05/23/2022   Alcohol  use disorder, severe, dependence (HCC)    Cocaine  use disorder, severe, dependence (HCC)    Cannabis use disorder, severe, dependence (HCC)    Alcohol  abuse with withdrawal (HCC) 04/27/2021   Acute encephalopathy    Hypokalemia    Tobacco abuse    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   Past Medical History:  Diagnosis Date   Asthma    Cocaine  abuse (HCC)    in past   Crohn disease (HCC) 2008   Depression    in past, not current as of 2019    ETOH abuse    history of alcohol  use   GERD (gastroesophageal reflux disease)     Family History  Problem Relation Age of Onset   Stomach cancer Maternal Grandfather    Colon cancer Neg Hx     Past Surgical History:  Procedure Laterality Date   BIOPSY  10/15/2017   Procedure: BIOPSY;  Surgeon: Harvey Margo CROME, MD;  Location: [facility];  Service: Endoscopy;;  terminal ileum colon   COLONOSCOPY WITH PROPOFOL  N/A 10/15/2017   redundant colon, normal rectum, external and internal hemorrhoids, quiescent crohn's.  Pathology: inactive chronicn nonspecific ileitis   ESOPHAGOGASTRODUODENOSCOPY (EGD) WITH PROPOFOL  N/A 10/15/2017   normal esophagus, small hiatal hernia, gastritis, duodenitis   FINGER AMPUTATION Right 2012   cut finger table saw and surgically amputated   Social History   Occupational History   Not on file  Tobacco Use   Smoking status: Every Day    Current packs/day: 0.50    Average packs/day: 0.5 packs/day for 15.0 years (7.5 ttl pk-yrs)    Types: Cigarettes   Smokeless tobacco: Never   Tobacco comments:    1/4 to 1/2 pack per day   Vaping Use   Vaping status: Never Used  Substance and Sexual Activity   Alcohol  use: Not Currently    Comment: alcoholic- last use 11/10/20   Drug use: Not Currently    Types: Cocaine , Marijuana, Methamphetamines  Comment: last cocaine  use 10/10/20.  last meth april 2022   Sexual activity: Not on file

## 2024-01-31 ENCOUNTER — Ambulatory Visit (HOSPITAL_COMMUNITY): Payer: Self-pay

## 2024-02-03 ENCOUNTER — Ambulatory Visit (HOSPITAL_COMMUNITY): Payer: Self-pay

## 2024-02-05 ENCOUNTER — Ambulatory Visit (HOSPITAL_COMMUNITY): Payer: Self-pay

## 2024-02-07 ENCOUNTER — Ambulatory Visit (HOSPITAL_COMMUNITY): Payer: Self-pay

## 2024-02-10 ENCOUNTER — Ambulatory Visit (HOSPITAL_COMMUNITY): Payer: Self-pay

## 2024-02-12 ENCOUNTER — Ambulatory Visit (HOSPITAL_COMMUNITY): Payer: Self-pay

## 2024-02-14 ENCOUNTER — Ambulatory Visit (HOSPITAL_COMMUNITY): Payer: Self-pay

## 2024-02-17 ENCOUNTER — Ambulatory Visit (HOSPITAL_COMMUNITY): Payer: Self-pay

## 2024-02-18 ENCOUNTER — Encounter (HOSPITAL_COMMUNITY): Payer: MEDICAID | Admitting: Physician Assistant

## 2024-02-18 ENCOUNTER — Encounter (HOSPITAL_COMMUNITY): Payer: Self-pay

## 2024-02-19 ENCOUNTER — Ambulatory Visit (HOSPITAL_COMMUNITY): Payer: Self-pay

## 2024-02-21 ENCOUNTER — Ambulatory Visit (HOSPITAL_COMMUNITY): Payer: Self-pay

## 2024-03-16 ENCOUNTER — Encounter: Payer: Self-pay | Admitting: Radiology

## 2024-04-19 ENCOUNTER — Emergency Department (HOSPITAL_COMMUNITY): Payer: MEDICAID

## 2024-04-19 ENCOUNTER — Other Ambulatory Visit: Payer: Self-pay

## 2024-04-19 ENCOUNTER — Emergency Department (HOSPITAL_COMMUNITY)
Admission: EM | Admit: 2024-04-19 | Discharge: 2024-04-19 | Disposition: A | Discharge status: Home / Self Care | Payer: MEDICAID | Attending: Emergency Medicine | Admitting: Emergency Medicine

## 2024-04-19 DIAGNOSIS — J4489 Other specified chronic obstructive pulmonary disease: Secondary | ICD-10-CM | POA: Insufficient documentation

## 2024-04-19 DIAGNOSIS — R000 Tachycardia, unspecified: Secondary | ICD-10-CM | POA: Insufficient documentation

## 2024-04-19 DIAGNOSIS — R0602 Shortness of breath: Secondary | ICD-10-CM | POA: Insufficient documentation

## 2024-04-19 DIAGNOSIS — J4541 Moderate persistent asthma with (acute) exacerbation: Secondary | ICD-10-CM

## 2024-04-19 LAB — COMPREHENSIVE METABOLIC PANEL WITH GFR
ALT: 24 U/L (ref 0–44)
AST: 32 U/L (ref 15–41)
Albumin: 4.4 g/dL (ref 3.5–5.0)
Alkaline Phosphatase: 70 U/L (ref 38–126)
Anion gap: 16 — ABNORMAL HIGH (ref 5–15)
BUN: 11 mg/dL (ref 6–20)
CO2: 21 mmol/L — ABNORMAL LOW (ref 22–32)
Calcium: 8.9 mg/dL (ref 8.9–10.3)
Chloride: 102 mmol/L (ref 98–111)
Creatinine, Ser: 0.84 mg/dL (ref 0.61–1.24)
GFR, Estimated: >60 mL/min (ref >60)
Glucose, Bld: 138 mg/dL — ABNORMAL HIGH (ref 70–99)
Potassium: 4.1 mmol/L (ref 3.5–5.1)
Sodium: 138 mmol/L (ref 135–145)
Total Bilirubin: 0.5 mg/dL (ref 0.0–1.2)
Total Protein: 6.8 g/dL (ref 6.5–8.1)

## 2024-04-19 LAB — CBC WITH DIFFERENTIAL/PLATELET
Abs Immature Granulocytes: 0.01 K/uL (ref 0.00–0.07)
Basophils Absolute: 0 K/uL (ref 0.0–0.1)
Basophils Relative: 1 %
Eosinophils Absolute: 0 K/uL (ref 0.0–0.5)
Eosinophils Relative: 1 %
HCT: 47.4 % (ref 39.0–52.0)
Hemoglobin: 16.6 g/dL (ref 13.0–17.0)
Immature Granulocytes: 0 %
Lymphocytes Relative: 44 %
Lymphs Abs: 3.3 K/uL (ref 0.7–4.0)
MCH: 31.4 pg (ref 26.0–34.0)
MCHC: 35 g/dL (ref 30.0–36.0)
MCV: 89.8 fL (ref 80.0–100.0)
Monocytes Absolute: 0.5 K/uL (ref 0.1–1.0)
Monocytes Relative: 6 %
Neutro Abs: 3.7 K/uL (ref 1.7–7.7)
Neutrophils Relative %: 48 %
Platelets: 297 K/uL (ref 150–400)
RBC: 5.28 MIL/uL (ref 4.22–5.81)
RDW: 13.1 % (ref 11.5–15.5)
WBC: 7.6 K/uL (ref 4.0–10.5)
nRBC: 0 % (ref 0.0–0.2)

## 2024-04-19 MED ORDER — ALBUTEROL SULFATE HFA 108 (90 BASE) MCG/ACT IN AERS
2.0000 | INHALATION_SPRAY | RESPIRATORY_TRACT | 3 refills | Status: AC | PRN
  Filled 2024-04-19: qty 6.7, 17d supply, fill #0

## 2024-04-19 MED ORDER — IPRATROPIUM-ALBUTEROL 0.5-2.5 (3) MG/3ML IN SOLN
3.0000 mL | RESPIRATORY_TRACT | Status: AC
  Administered 2024-04-19 (×3): 3 mL via RESPIRATORY_TRACT
  Filled 2024-04-19: qty 9

## 2024-04-19 MED ORDER — AEROCHAMBER PLUS FLO-VU MEDIUM MISC
1.0000 | Freq: Once | Status: AC
  Administered 2024-04-19: 1

## 2024-04-19 MED ORDER — METHYLPREDNISOLONE SODIUM SUCC 125 MG IJ SOLR
125.0000 mg | Freq: Once | INTRAMUSCULAR | Status: AC
  Administered 2024-04-19: 125 mg via INTRAVENOUS
  Filled 2024-04-19: qty 2

## 2024-04-19 MED ORDER — PREDNISONE 20 MG PO TABS
40.0000 mg | ORAL_TABLET | Freq: Every day | ORAL | 0 refills | Status: AC
  Filled 2024-04-19: qty 10, 5d supply, fill #0

## 2024-04-19 MED ORDER — MAGNESIUM SULFATE 2 GM/50ML IV SOLN
2.0000 g | Freq: Once | INTRAVENOUS | Status: AC
  Administered 2024-04-19: 2 g via INTRAVENOUS
  Filled 2024-04-19: qty 50

## 2024-04-19 NOTE — ED Notes (Signed)
  at bedside

## 2024-04-19 NOTE — ED Provider Notes (Signed)
 This patient was accepted at change of shift, he presents today with increasing shortness of breath that is started overnight, history of asthma, history of tobacco use, has a very old albuterol  inhaler that he uses as needed.  Did not seem to be helping last night.  After getting Solu-Medrol  and continuous therapy here with bronchodilators the patient's oxygen  is up to 94%, he speaks in full sentences without tachypnea, heart rate is down to 105 bpm.  He has no edema of the legs, no risk factors for pulmonary embolism, this seems consistent with asthma attack.  He does report having a mild URI last week.  Patient will be given refills for albuterol , prescription for prednisone  and he was given an AeroChamber and taught how to use it prior to discharge.  He is agreeable to return should symptoms worsen.  I did counsel the patient on tobacco abuse   Cleotilde Rogue, MD 04/19/24 [phone]

## 2024-04-19 NOTE — ED Notes (Signed)
 Patient placed on 2 L of oxygen  via nasal cannula for desat down to 86%-89%.

## 2024-04-19 NOTE — ED Triage Notes (Signed)
 Pt here with c/o sever asthma attack. States he has used his inhaler without success. Dr Lorette at bedside.

## 2024-04-19 NOTE — ED Provider Notes (Signed)
 [place] EMERGENCY DEPARTMENT AT [facility] Provider Note   CSN: [id number] Arrival date & time: 04/19/24  9452     Patient presents with: Shortness of Breath   Brendan Ochoa is a 48 y.o. male.  {Add pertinent medical, surgical, social history, OB history to HPI:32947} Difficult to get history secondary to respiratory distress and work of breathing however patient has had shortness of breath and wheezing recently.  History of asthma.  Also has a history of allergies but no known exposures and no rash.  Also has a history of COPD.  Is been using his inhaler at home without much help.  Apparently at worse tonight.   Shortness of Breath      Prior to Admission medications   Medication Sig Start Date End Date Taking? Authorizing Provider  albuterol  (VENTOLIN  HFA) 108 (90 Base) MCG/ACT inhaler Inhale 2 puffs into the lungs every 6 (six) hours as needed for wheezing or shortness of breath.    [provider]  baclofen  (LIORESAL ) 10 MG tablet Take 1 tablet (10 mg total) by mouth 3 (three) times daily. 01/20/24 04/19/24  Leila Carlin BRAVO, PA-C  buPROPion  (WELLBUTRIN  XL) 150 MG 24 hr tablet Take 1 tablet (150 mg total) by mouth daily. 01/15/24   Nwoko, Uchenna E, PA    Allergies: Patient has no known allergies.    Review of Systems  Respiratory:  Positive for shortness of breath.     Updated Vital Signs BP (!) 100/47   Pulse (!) 115   Temp 98.3 F (36.8 C) (Axillary)   Resp (!) 27   Ht 5' 11 (1.803 m)   Wt 77.1 kg   SpO2 98%   BMI 23.71 kg/m   Physical Exam Vitals and nursing note reviewed.  Constitutional:      Appearance: He is well-developed.  HENT:     Head: Normocephalic and atraumatic.  Cardiovascular:     Rate and Rhythm: Tachycardia present.  Pulmonary:     Effort: Pulmonary effort is normal. Tachypnea present. No respiratory distress.     Breath sounds: Decreased breath sounds and wheezing present.  Abdominal:     General: There is no  distension.  Musculoskeletal:        General: Normal range of motion.     Cervical back: Normal range of motion.  Neurological:     Mental Status: He is alert.     (all labs ordered are listed, but only abnormal results are displayed) Labs Reviewed - No data to display  EKG: None  Radiology: No results found.  {Document cardiac monitor, telemetry assessment procedure when appropriate:32947} Procedures   Medications Ordered in the ED  ipratropium-albuterol  (DUONEB) 0.5-2.5 (3) MG/3ML nebulizer solution 3 mL (3 mLs Nebulization Given 04/19/24 0605)  magnesium  sulfate IVPB 2 g 50 mL (has no administration in time range)  methylPREDNISolone  sodium succinate (SOLU-MEDROL ) 125 mg/2 mL injection 125 mg (has no administration in time range)      {Click here for ABCD2, HEART and other calculators REFRESH Note before signing:1}                              Medical Decision Making Risk Prescription drug management.  Will start with 3 DuoNebs, steroids and magnesium  for likely asthma exacerbation.  Will hold on imaging or labs at this point.  Will reevaluate for need of any further workup after initial medications and observation. ***  {Document critical care  time when appropriate  Document review of labs and clinical decision tools ie CHADS2VASC2, etc  Document your independent review of radiology images and any outside records  Document your discussion with family members, caretakers and with consultants  Document social determinants of health affecting pt's care  Document your decision making why or why not admission, treatments were needed:32947:::1}   Final diagnoses:  None    ED Discharge Orders     None

## 2024-04-19 NOTE — ED Notes (Signed)
 Ambulation successful.  SpO2 briefly dropped to 92%; patient stated he feels great.

## 2024-04-19 NOTE — Discharge Instructions (Signed)
 Albuterol  is an inhaled medication which can help you to breathe better, you should take 2 puffs every 4 hours as needed, this may cause your heart to feel like it is racing, this should be a temporary side effect.   Prednisone  is a steroid that helps to reduce certain types of inflammation and may be used for allergic reactions, some rashes such as poison ivy or dermatitis, for asthma attacks or bronchitis and for certain types of pain.  Please take this medicine exactly as prescribed - 40mg  by mouth daily for 5 days.  This can have certain side effects with some people including feeling like you can't sleep, feeling anxious or feeling like you are on a high.  It should not cause weight gain if only taken for a short time.  Please be aware that this medication may also cause an elevation in your blood sugar if you are a diabetic so if you are a diabetic you will need to keep a very close eye on your blood sugar, make sure that you are eating an extremely low level of carbohydrates and taking your medications exactly as prescribed.  If you should develop severe high blood sugar or start to feel poorly return to the emergency department immediately    Thank you for allowing us  to treat you in the emergency department today.  After reviewing your examination and potential testing that was done it appears that you are safe to go home.  I would like for you to follow-up with your doctor within the next several days, have them obtain your records and follow-up with them to review all potential tests and results from your visit.  If you should develop severe or worsening symptoms return to the emergency department immediately  Stop smoking

## 2024-04-20 ENCOUNTER — Telehealth: Payer: Self-pay | Admitting: Internal Medicine

## 2024-04-20 ENCOUNTER — Other Ambulatory Visit: Payer: Self-pay

## 2024-04-20 NOTE — Telephone Encounter (Signed)
 Contacted patient. [facility].

## 2024-04-20 NOTE — Telephone Encounter (Signed)
 Copied from CRM #[id number]. Topic: Appointments - Scheduling Inquiry for Clinic >> Apr 20, 2024 10:40 AM Viola F wrote:  Reason for CRM: Patient was seen at [place] EMERGENCY DEPARTMENT AT [facility] 04/19/24 - he was told to follow up with pcp within 3 days but Dr. Vicci is booking in January 30th. Please call patient to schedule sooner if possible [phone] (M)

## 2024-04-27 ENCOUNTER — Ambulatory Visit: Payer: MEDICAID | Admitting: Nurse Practitioner

## 2024-04-27 ENCOUNTER — Other Ambulatory Visit: Payer: Self-pay

## 2024-04-27 ENCOUNTER — Encounter: Payer: Self-pay | Admitting: Nurse Practitioner

## 2024-04-27 VITALS — BP 120/76 | HR 100 | Wt 172.0 lb

## 2024-04-27 DIAGNOSIS — J4520 Mild intermittent asthma, uncomplicated: Secondary | ICD-10-CM | POA: Diagnosis not present

## 2024-04-27 MED ORDER — MONTELUKAST SODIUM 10 MG PO TABS
10.0000 mg | ORAL_TABLET | Freq: Every day | ORAL | 3 refills | Status: AC
  Filled 2024-04-27: qty 30, 30d supply, fill #0

## 2024-04-27 NOTE — Progress Notes (Signed)
 Subjective   Patient ID: Brendan Ochoa, male    DOB: 03-04-1976, 48 y.o.   MRN: [id number]  Chief Complaint  Patient presents with   Hospitalization Follow-up    Moderate persistent asthma with exacerbation    Referring provider: Vicci Barnie NOVAK, MD  Brendan Ochoa is a 48 y.o. male with Past Medical History: No date: Asthma No date: Cocaine  abuse [facility])     Comment:  in past 2008: Crohn disease (HCC) No date: Depression     Comment:  in past, not current as of 2019  No date: ETOH abuse     Comment:  history of alcohol  use No date: GERD (gastroesophageal reflux disease)   HPI  Patient presents today for a ED follow-up.  Brendan Ochoa was seen in the ED on 04/19/2024 for asthma exacerbation.  Brendan Ochoa states that Brendan Ochoa is now better.  Brendan Ochoa does continue to smoke but does want to start cutting back.  We will order Singulair  for him for asthma.  Brendan Ochoa does not need a refill on albuterol  at this time.  Brendan Ochoa was given a spacer to use with Brendan Ochoa inhaler in the emergency room and states that this has helped.  This is a patient of Dr. Vicci.  Brendan Ochoa will need to follow-up with her.      Allergies[1]  Immunization History  Administered Date(s) Administered   Influenza Whole 04/16/2008   Influenza-Unspecified 03/11/2019    Tobacco History: Tobacco Use History[2] Ready to quit: Not Answered Counseling given: Not Answered Tobacco comments: 1/4 to 1/2 pack per day    Outpatient Encounter Medications as of 04/27/2024  Medication Sig   albuterol  (VENTOLIN  HFA) 108 (90 Base) MCG/ACT inhaler Inhale 2 puffs into the lungs every 4 (four) hours as needed for wheezing or shortness of breath.   montelukast  (SINGULAIR ) 10 MG tablet Take 1 tablet (10 mg total) by mouth at bedtime.   predniSONE  (DELTASONE ) 20 MG tablet Take 2 tablets (40 mg total) by mouth daily.   buPROPion  (WELLBUTRIN  XL) 150 MG 24 hr tablet Take 1 tablet (150 mg total) by mouth daily. (Patient not taking: Reported on 04/27/2024)   No  facility-administered encounter medications on file as of 04/27/2024.    Review of Systems  Review of Systems  Constitutional: Negative.   HENT: Negative.    Cardiovascular: Negative.   Gastrointestinal: Negative.   Allergic/Immunologic: Negative.   Neurological: Negative.   Psychiatric/Behavioral: Negative.       Objective:   BP 120/76 (BP Location: Left Arm, Patient Position: Sitting, Cuff Size: Large)   Pulse 100   Wt 172 lb (78 kg)   SpO2 100%   BMI 23.99 kg/m   Wt Readings from Last 5 Encounters:  04/27/24 172 lb (78 kg)  04/19/24 170 lb (77.1 kg)  01/21/24 170 lb 9.6 oz (77.4 kg)  07/25/23 167 lb 6.4 oz (75.9 kg)  02/12/23 160 lb 9.6 oz (72.8 kg)     Physical Exam Vitals and nursing note reviewed.  Constitutional:      General: Brendan Ochoa is not in acute distress.    Appearance: Brendan Ochoa is well-developed.  Cardiovascular:     Rate and Rhythm: Normal rate and regular rhythm.  Pulmonary:     Effort: Pulmonary effort is normal.     Breath sounds: Normal breath sounds.  Skin:    General: Skin is warm and dry.  Neurological:     Mental Status: Brendan Ochoa is alert and oriented to person, place, and time.  Assessment & Plan:   Mild intermittent asthma without complication -     Montelukast  Sodium; Take 1 tablet (10 mg total) by mouth at bedtime.  Dispense: 30 tablet; Refill: 3     No follow-ups on file.   Bascom GORMAN Borer, NP 04/27/2024     [1] No Known Allergies [2]  Social History Tobacco Use  Smoking Status Every Day   Current packs/day: 0.50   Average packs/day: 0.5 packs/day for 15.0 years (7.5 ttl pk-yrs)   Types: Cigarettes  Smokeless Tobacco Never  Tobacco Comments   1/4 to 1/2 pack per day

## 2024-04-30 ENCOUNTER — Inpatient Hospital Stay: Payer: Self-pay | Admitting: Family Medicine
